# Patient Record
Sex: Male | Born: 1945 | Race: Black or African American | Hispanic: No | Marital: Married | Smoking: Never smoker
Health system: Southern US, Community
[De-identification: ages and names within clinical notes are randomized; demographics above are authoritative.]

## PROBLEM LIST (undated history)

## (undated) DIAGNOSIS — I10 Essential (primary) hypertension: Secondary | ICD-10-CM

## (undated) DIAGNOSIS — E785 Hyperlipidemia, unspecified: Secondary | ICD-10-CM

## (undated) DIAGNOSIS — H269 Unspecified cataract: Secondary | ICD-10-CM

## (undated) DIAGNOSIS — I219 Acute myocardial infarction, unspecified: Secondary | ICD-10-CM

## (undated) DIAGNOSIS — R011 Cardiac murmur, unspecified: Secondary | ICD-10-CM

## (undated) DIAGNOSIS — Z9289 Personal history of other medical treatment: Secondary | ICD-10-CM

## (undated) DIAGNOSIS — I2510 Atherosclerotic heart disease of native coronary artery without angina pectoris: Secondary | ICD-10-CM

## (undated) DIAGNOSIS — G459 Transient cerebral ischemic attack, unspecified: Secondary | ICD-10-CM

## (undated) DIAGNOSIS — N200 Calculus of kidney: Secondary | ICD-10-CM

## (undated) DIAGNOSIS — J90 Pleural effusion, not elsewhere classified: Secondary | ICD-10-CM

## (undated) DIAGNOSIS — T7840XA Allergy, unspecified, initial encounter: Secondary | ICD-10-CM

## (undated) DIAGNOSIS — R319 Hematuria, unspecified: Secondary | ICD-10-CM

## (undated) DIAGNOSIS — C8590 Non-Hodgkin lymphoma, unspecified, unspecified site: Secondary | ICD-10-CM

## (undated) DIAGNOSIS — M1990 Unspecified osteoarthritis, unspecified site: Secondary | ICD-10-CM

## (undated) DIAGNOSIS — J189 Pneumonia, unspecified organism: Secondary | ICD-10-CM

## (undated) HISTORY — DX: Unspecified cataract: H26.9

## (undated) HISTORY — DX: Unspecified osteoarthritis, unspecified site: M19.90

## (undated) HISTORY — PX: KNEE ARTHROSCOPY: SUR90

## (undated) HISTORY — DX: Allergy, unspecified, initial encounter: T78.40XA

## (undated) HISTORY — DX: Hyperlipidemia, unspecified: E78.5

## (undated) HISTORY — DX: Essential (primary) hypertension: I10

## (undated) HISTORY — PX: CORONARY ANGIOPLASTY WITH STENT PLACEMENT: SHX49

## (undated) HISTORY — DX: Personal history of other medical treatment: Z92.89

---
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
## 1997-12-03 ENCOUNTER — Emergency Department (HOSPITAL_COMMUNITY): Admission: EM | Admit: 1997-12-03 | Discharge: 1997-12-03 | Payer: Self-pay | Admitting: Emergency Medicine

## 1999-08-29 DIAGNOSIS — I219 Acute myocardial infarction, unspecified: Secondary | ICD-10-CM

## 1999-08-29 HISTORY — DX: Acute myocardial infarction, unspecified: I21.9

## 2000-08-28 HISTORY — PX: COLONOSCOPY: SHX174

## 2000-08-28 HISTORY — PX: ESOPHAGOGASTRODUODENOSCOPY (EGD) WITH ESOPHAGEAL DILATION: SHX5812

## 2000-10-19 ENCOUNTER — Ambulatory Visit (HOSPITAL_COMMUNITY): Admission: RE | Admit: 2000-10-19 | Discharge: 2000-10-19 | Payer: Self-pay | Admitting: Gastroenterology

## 2001-09-17 ENCOUNTER — Encounter (INDEPENDENT_AMBULATORY_CARE_PROVIDER_SITE_OTHER): Payer: Self-pay

## 2001-09-17 ENCOUNTER — Ambulatory Visit (HOSPITAL_COMMUNITY): Admission: RE | Admit: 2001-09-17 | Discharge: 2001-09-17 | Payer: Self-pay | Admitting: Surgery

## 2001-09-17 ENCOUNTER — Encounter: Payer: Self-pay | Admitting: Surgery

## 2001-10-09 ENCOUNTER — Encounter (INDEPENDENT_AMBULATORY_CARE_PROVIDER_SITE_OTHER): Payer: Self-pay | Admitting: Specialist

## 2001-10-09 ENCOUNTER — Ambulatory Visit (HOSPITAL_COMMUNITY): Admission: RE | Admit: 2001-10-09 | Discharge: 2001-10-09 | Payer: Self-pay | Admitting: Hematology & Oncology

## 2001-12-25 ENCOUNTER — Emergency Department (HOSPITAL_COMMUNITY): Admission: EM | Admit: 2001-12-25 | Discharge: 2001-12-25 | Payer: Self-pay | Admitting: Emergency Medicine

## 2001-12-31 ENCOUNTER — Inpatient Hospital Stay (HOSPITAL_COMMUNITY): Admission: EM | Admit: 2001-12-31 | Discharge: 2002-01-02 | Payer: Self-pay | Admitting: Neurosurgery

## 2001-12-31 ENCOUNTER — Encounter: Admission: RE | Admit: 2001-12-31 | Discharge: 2001-12-31 | Payer: Self-pay | Admitting: Internal Medicine

## 2001-12-31 ENCOUNTER — Encounter: Payer: Self-pay | Admitting: Internal Medicine

## 2002-01-01 ENCOUNTER — Encounter: Payer: Self-pay | Admitting: Neurosurgery

## 2002-02-05 ENCOUNTER — Encounter: Admission: RE | Admit: 2002-02-05 | Discharge: 2002-02-05 | Payer: Self-pay | Admitting: Neurosurgery

## 2002-02-05 ENCOUNTER — Encounter: Payer: Self-pay | Admitting: Neurosurgery

## 2003-07-31 ENCOUNTER — Encounter: Admission: RE | Admit: 2003-07-31 | Discharge: 2003-07-31 | Payer: Self-pay | Admitting: Family Medicine

## 2003-12-24 ENCOUNTER — Encounter: Admission: RE | Admit: 2003-12-24 | Discharge: 2003-12-24 | Payer: Self-pay | Admitting: Internal Medicine

## 2004-09-05 ENCOUNTER — Ambulatory Visit: Payer: Self-pay | Admitting: Internal Medicine

## 2004-09-09 ENCOUNTER — Encounter: Admission: RE | Admit: 2004-09-09 | Discharge: 2004-09-09 | Payer: Self-pay | Admitting: Internal Medicine

## 2005-04-06 ENCOUNTER — Ambulatory Visit: Payer: Self-pay | Admitting: Internal Medicine

## 2005-06-20 ENCOUNTER — Ambulatory Visit: Payer: Self-pay | Admitting: Family Medicine

## 2006-05-16 ENCOUNTER — Ambulatory Visit: Payer: Self-pay | Admitting: Internal Medicine

## 2006-06-06 ENCOUNTER — Ambulatory Visit: Payer: Self-pay | Admitting: Internal Medicine

## 2007-01-01 ENCOUNTER — Ambulatory Visit: Payer: Self-pay | Admitting: Internal Medicine

## 2007-01-03 DIAGNOSIS — Z8572 Personal history of non-Hodgkin lymphomas: Secondary | ICD-10-CM | POA: Insufficient documentation

## 2007-01-04 ENCOUNTER — Ambulatory Visit: Payer: Self-pay

## 2007-01-04 ENCOUNTER — Encounter: Payer: Self-pay | Admitting: Cardiology

## 2007-01-04 ENCOUNTER — Ambulatory Visit: Payer: Self-pay | Admitting: Internal Medicine

## 2007-01-07 ENCOUNTER — Encounter: Payer: Self-pay | Admitting: Internal Medicine

## 2007-03-15 ENCOUNTER — Encounter: Payer: Self-pay | Admitting: Internal Medicine

## 2007-06-07 ENCOUNTER — Encounter: Payer: Self-pay | Admitting: Internal Medicine

## 2007-06-24 ENCOUNTER — Telehealth (INDEPENDENT_AMBULATORY_CARE_PROVIDER_SITE_OTHER): Payer: Self-pay | Admitting: *Deleted

## 2007-06-26 ENCOUNTER — Ambulatory Visit: Payer: Self-pay | Admitting: Internal Medicine

## 2007-06-26 ENCOUNTER — Telehealth (INDEPENDENT_AMBULATORY_CARE_PROVIDER_SITE_OTHER): Payer: Self-pay | Admitting: *Deleted

## 2007-06-26 DIAGNOSIS — Z87442 Personal history of urinary calculi: Secondary | ICD-10-CM

## 2007-06-26 LAB — CONVERTED CEMR LAB
Glucose, Urine, Semiquant: NEGATIVE
Nitrite: NEGATIVE
Protein, U semiquant: NEGATIVE
WBC Urine, dipstick: NEGATIVE
pH: 6.5

## 2007-07-31 ENCOUNTER — Encounter: Payer: Self-pay | Admitting: Internal Medicine

## 2007-09-13 ENCOUNTER — Encounter: Payer: Self-pay | Admitting: Internal Medicine

## 2007-10-26 ENCOUNTER — Encounter: Payer: Self-pay | Admitting: Internal Medicine

## 2007-10-29 ENCOUNTER — Telehealth (INDEPENDENT_AMBULATORY_CARE_PROVIDER_SITE_OTHER): Payer: Self-pay | Admitting: *Deleted

## 2007-11-07 ENCOUNTER — Telehealth (INDEPENDENT_AMBULATORY_CARE_PROVIDER_SITE_OTHER): Payer: Self-pay | Admitting: *Deleted

## 2007-11-08 ENCOUNTER — Telehealth (INDEPENDENT_AMBULATORY_CARE_PROVIDER_SITE_OTHER): Payer: Self-pay | Admitting: *Deleted

## 2007-12-12 ENCOUNTER — Encounter: Payer: Self-pay | Admitting: Internal Medicine

## 2008-04-06 ENCOUNTER — Encounter: Payer: Self-pay | Admitting: Internal Medicine

## 2008-05-15 ENCOUNTER — Encounter: Admission: RE | Admit: 2008-05-15 | Discharge: 2008-05-15 | Payer: Self-pay | Admitting: Ophthalmology

## 2008-06-08 ENCOUNTER — Ambulatory Visit (HOSPITAL_COMMUNITY): Admission: RE | Admit: 2008-06-08 | Discharge: 2008-06-08 | Payer: Self-pay | Admitting: Ophthalmology

## 2008-06-22 ENCOUNTER — Encounter: Payer: Self-pay | Admitting: Internal Medicine

## 2008-11-26 ENCOUNTER — Ambulatory Visit: Payer: Self-pay | Admitting: Internal Medicine

## 2008-11-26 DIAGNOSIS — I10 Essential (primary) hypertension: Secondary | ICD-10-CM | POA: Insufficient documentation

## 2009-06-22 ENCOUNTER — Ambulatory Visit: Payer: Self-pay | Admitting: Internal Medicine

## 2009-06-22 DIAGNOSIS — R319 Hematuria, unspecified: Secondary | ICD-10-CM | POA: Insufficient documentation

## 2009-06-22 LAB — CONVERTED CEMR LAB
Ketones, urine, test strip: NEGATIVE
Nitrite: NEGATIVE
Protein, U semiquant: NEGATIVE
Specific Gravity, Urine: 1.015
Urobilinogen, UA: 0.2
WBC Urine, dipstick: NEGATIVE

## 2009-06-23 ENCOUNTER — Encounter: Payer: Self-pay | Admitting: Internal Medicine

## 2009-06-25 ENCOUNTER — Encounter (INDEPENDENT_AMBULATORY_CARE_PROVIDER_SITE_OTHER): Payer: Self-pay | Admitting: *Deleted

## 2009-12-15 ENCOUNTER — Ambulatory Visit: Payer: Self-pay | Admitting: Internal Medicine

## 2009-12-15 DIAGNOSIS — N390 Urinary tract infection, site not specified: Secondary | ICD-10-CM

## 2009-12-15 DIAGNOSIS — N401 Enlarged prostate with lower urinary tract symptoms: Secondary | ICD-10-CM

## 2009-12-15 DIAGNOSIS — N400 Enlarged prostate without lower urinary tract symptoms: Secondary | ICD-10-CM | POA: Insufficient documentation

## 2009-12-15 DIAGNOSIS — N138 Other obstructive and reflux uropathy: Secondary | ICD-10-CM

## 2009-12-15 LAB — CONVERTED CEMR LAB
Bilirubin Urine: NEGATIVE
Ketones, urine, test strip: NEGATIVE
Nitrite: NEGATIVE
Protein, U semiquant: NEGATIVE
Urobilinogen, UA: 0.2

## 2009-12-16 ENCOUNTER — Encounter: Payer: Self-pay | Admitting: Internal Medicine

## 2009-12-21 LAB — CONVERTED CEMR LAB
Basophils Relative: 1.5 % (ref 0.0–3.0)
Eosinophils Relative: 1.1 % (ref 0.0–5.0)
Hemoglobin: 12.7 g/dL — ABNORMAL LOW (ref 13.0–17.0)
MCHC: 33.7 g/dL (ref 30.0–36.0)
MCV: 89.4 fL (ref 78.0–100.0)
PSA: 8.78 ng/mL — ABNORMAL HIGH (ref 0.10–4.00)
RBC: 4.22 M/uL (ref 4.22–5.81)
WBC: 4.1 10*3/uL — ABNORMAL LOW (ref 4.5–10.5)

## 2009-12-24 ENCOUNTER — Ambulatory Visit: Payer: Self-pay | Admitting: Internal Medicine

## 2009-12-28 ENCOUNTER — Ambulatory Visit: Payer: Self-pay | Admitting: Internal Medicine

## 2009-12-28 ENCOUNTER — Telehealth (INDEPENDENT_AMBULATORY_CARE_PROVIDER_SITE_OTHER): Payer: Self-pay | Admitting: *Deleted

## 2009-12-28 DIAGNOSIS — D649 Anemia, unspecified: Secondary | ICD-10-CM

## 2009-12-28 DIAGNOSIS — R972 Elevated prostate specific antigen [PSA]: Secondary | ICD-10-CM | POA: Insufficient documentation

## 2009-12-28 DIAGNOSIS — D696 Thrombocytopenia, unspecified: Secondary | ICD-10-CM | POA: Insufficient documentation

## 2009-12-30 LAB — CONVERTED CEMR LAB
Basophils Absolute: 0 10*3/uL (ref 0.0–0.1)
Basophils Relative: 0.5 % (ref 0.0–3.0)
Eosinophils Absolute: 0.1 10*3/uL (ref 0.0–0.7)
Folate: 7 ng/mL
Hemoglobin: 13.4 g/dL (ref 13.0–17.0)
Iron: 82 ug/dL (ref 42–165)
Lymphocytes Relative: 18.6 % (ref 12.0–46.0)
MCHC: 34.5 g/dL (ref 30.0–36.0)
Monocytes Relative: 11.9 % (ref 3.0–12.0)
Neutro Abs: 1.9 10*3/uL (ref 1.4–7.7)
Neutrophils Relative %: 65.4 % (ref 43.0–77.0)
RBC: 4.39 M/uL (ref 4.22–5.81)
RDW: 13.1 % (ref 11.5–14.6)
Transferrin: 237.3 mg/dL (ref 212.0–360.0)

## 2010-01-26 ENCOUNTER — Encounter: Payer: Self-pay | Admitting: Internal Medicine

## 2010-02-14 ENCOUNTER — Ambulatory Visit: Payer: Self-pay | Admitting: Internal Medicine

## 2010-02-18 ENCOUNTER — Telehealth (INDEPENDENT_AMBULATORY_CARE_PROVIDER_SITE_OTHER): Payer: Self-pay | Admitting: *Deleted

## 2010-03-03 ENCOUNTER — Ambulatory Visit: Payer: Self-pay | Admitting: Internal Medicine

## 2010-03-14 LAB — CONVERTED CEMR LAB
Albumin: 4.3 g/dL (ref 3.5–5.2)
CO2: 30 meq/L (ref 19–32)
Calcium: 9.8 mg/dL (ref 8.4–10.5)
Creatinine, Ser: 1.1 mg/dL (ref 0.4–1.5)
GFR calc non Af Amer: 88.18 mL/min (ref >60)
Glucose, Bld: 90 mg/dL (ref 70–99)
PSA: 2.58 ng/mL (ref 0.10–4.00)
Sodium: 140 meq/L (ref 135–145)
Total Protein: 7.3 g/dL (ref 6.0–8.3)

## 2010-04-07 ENCOUNTER — Telehealth (INDEPENDENT_AMBULATORY_CARE_PROVIDER_SITE_OTHER): Payer: Self-pay | Admitting: *Deleted

## 2010-05-06 ENCOUNTER — Encounter: Payer: Self-pay | Admitting: Internal Medicine

## 2010-08-05 ENCOUNTER — Encounter: Payer: Self-pay | Admitting: Internal Medicine

## 2010-09-18 ENCOUNTER — Encounter: Payer: Self-pay | Admitting: Otolaryngology

## 2010-09-19 ENCOUNTER — Encounter: Payer: Self-pay | Admitting: Ophthalmology

## 2010-09-27 NOTE — Letter (Signed)
Summary: [facility]  [facility]   Imported By: Lanelle Bal 02/03/2010 13:29:19  _____________________________________________________________________  External Attachment:    Type:   Image     Comment:   External Document

## 2010-09-27 NOTE — Progress Notes (Signed)
Summary: Refill request  Phone Note Refill Request Call back at [phone] Message from:  Patient  Refills Requested: Medication #1:  LOTREL 5-20 MG CAPS Take one capsule daily. [facility]   Method Requested: Electronic Initial call taken by: Shonna Chock,  February 18, 2010 3:20 PM    Prescriptions: LOTREL 5-20 MG CAPS (AMLODIPINE BESY-BENAZEPRIL HCL) Take one capsule daily  #90 Capsule x 3   Entered by:   Shonna Chock   Authorized by:   Marga Melnick MD   Signed by:   Shonna Chock on 02/18/2010   Method used:   Electronically to        [facility] [address] [phone]* (retail)       [address] [place], [place]  [address]       Ph: [phone]       Fax: [phone]   RxID:   [id number]

## 2010-09-27 NOTE — Assessment & Plan Note (Signed)
Summary: rto uti/cbs pt will arrive at 8:15   Vital Signs:  Patient profile:   65 year old male Weight:      209 pounds Temp:     98.5 degrees F oral Pulse rate:   64 / minute Resp:     12 per minute BP sitting:   134 / 86  (left arm) Cuff size:   large  Vitals Entered By: Shonna Chock (Dec 28, 2009 8:15 AM) CC: Discuss UTI symptoms Comments REVIEWED MED LIST, PATIENT AGREED DOSE AND INSTRUCTION CORRECT    CC:  Discuss UTI symptoms.  History of Present Illness: Residual intermittent suprapubic area "soreness".GU ROS improved (see notes) F/U urine culture : no growth. CBC revealed mild anemia(12.7/37.7), low WBC (4100 with 21% monos) & TCP(90,000). PSA: 8.78 .DRE : revealed only borderline enlargement w/o nodules or other pathology.  Allergies (verified): No Known Drug Allergies  Review of Systems General:  Denies chills, fever, sweats, and weight loss. GI:  Denies abdominal pain, bloody stools, dark tarry stools, and indigestion. GU:  Complains of urinary frequency; denies discharge, dysuria, and hematuria; Nocturia down from 4X to 2X/ night , usually early in am (2 am & 5:45 am).  Physical Exam  General:  Thin but well-nourished,in no acute distress; alert,appropriate and cooperative throughout examination Abdomen:  Bowel sounds positive,abdomen soft but  slightly tender suprapubic area  without masses, organomegaly or hernias noted. Skin:  Intact without suspicious lesions or rashes Cervical Nodes:  No lymphadenopathy noted Axillary Nodes:  No palpable lymphadenopathy Inguinal Nodes:  No significant adenopathy   Impression & Recommendations:  Problem # 1:  PSA, INCREASED (ICD-790.93)  probable prostatitis in context of E coli UTI  Orders: Venipuncture ([address]) TLB-PSA (Prostate Specific Antigen) (84153-PSA)  Problem # 2:  ANEMIA, MILD (ICD-285.9)  Orders: Venipuncture (60454) TLB-CBC Platelet - w/Differential (85025-CBCD) TLB-B12 + Folate Pnl  (09811_91478-G95/AOZ) TLB-IBC Pnl (Iron/FE;Transferrin) (83550-IBC)  Problem # 3:  THROMBOCYTOPENIA (ICD-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 1 by mouth qd 2)  Lotrel 5-20 Mg Caps (Amlodipine besy-benazepril hcl) .... Take one capsule daily 3)  Ciprofloxacin Hcl 500 Mg Tabs (Ciprofloxacin hcl) .Marland Kitchen.. 1 two times a day  Patient Instructions: 1)  Drink to thirst , up to 40 oz/ day.  Appended Document: rto uti/cbs pt will arrive at 8:15 Large ganglion dorsum of L hand.Gentle massage recommended ; Hand Surgeon if progressive.

## 2010-09-27 NOTE — Assessment & Plan Note (Signed)
Summary: rto /cbs   Vital Signs:  Patient profile:   65 year old male Weight:      207.6 pounds Pulse rate:   72 / minute Resp:     14 per minute BP sitting:   130 / 80  (left arm) Cuff size:   large  Vitals Entered By: Shonna Chock (February 14, 2010 3:38 PM) CC: follow-up visit Comments REVIEWED MED LIST, PATIENT AGREED DOSE AND INSTRUCTION CORRECT    CC:  follow-up visit.  History of Present Illness: Dr Mercy Riding  01/26/2010 note reviewed ; infusion(chemo)  every 90 days. He mentions possible CT scan in 03/2010. No recurrent UTI symptoms. He is concerned about the Lymphoma impacting hepatic & renal function.  Allergies (verified): No Known Drug Allergies  Review of Systems General:  Denies chills, fatigue, fever, sweats, and weight loss. GU:  Denies discharge, dysuria, and hematuria; Nocturia 1-2X/ night.  Physical Exam  General:  well-nourished,in no acute distress; alert,appropriate and cooperative throughout examination Eyes:  Small sty OD lower lid Lungs:  Normal respiratory effort, chest expands symmetrically. Lungs are clear to auscultation, no crackles or wheezes. Heart:  normal rate, regular rhythm, no gallop, no rub, no JVD, no HJR, and grade 1 /6 systolic murmur.   Abdomen:  Bowel sounds positive,abdomen soft and non-tender without masses, organomegaly or hernias noted. 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 1 by mouth qd    Lotrel 5-20 Mg Caps (Amlodipine besy-benazepril hcl) .Marland Kitchen... Take one capsule daily  Complete Medication List: 1)   Doxazosin Mesylate 4 Mg Tabs (Doxazosin mesylate) .Marland Kitchen.. 1 by mouth qd 2)  Lotrel 5-20 Mg Caps (Amlodipine besy-benazepril hcl) .... Take one capsule daily  Patient Instructions: 1)  Schedule fasting labs: 2)  BMP prior to visit, ICD-9:401.9 3)  Hepatic Panel prior to visit, ICD-9:995.20 4)  PSA prior to visit, ICD-9:790.93

## 2010-09-27 NOTE — Letter (Signed)
Summary: [facility]  [facility]   Imported By: Lanelle Bal 05/17/2010 10:06:36  _____________________________________________________________________  External Attachment:    Type:   Image     Comment:   External Document

## 2010-09-27 NOTE — Assessment & Plan Note (Signed)
Summary: discuss, bladder issue//fd   Vital Signs:  Patient profile:   65 year old male Weight:      205 pounds Temp:     99.5 degrees F oral Pulse rate:   80 / minute Resp:     15 per minute BP sitting:   118 / 68  (left arm)  Vitals Entered By: Jeremy Johann CMA (December 15, 2009 12:52 PM) CC: uncontrolled urination x2days Comments REVIEWED MED LIST, PATIENT AGREED DOSE AND INSTRUCTION CORRECT    CC:  uncontrolled urination x2days.  History of Present Illness: Onset as nocturia 3-4X for 6 months; yesterday daytime frequency &severe  urgency & frank  incontinence. Dysuria last night & visualized blood noted in urine this am.Since this am volume is more normal. No Rx to date . PMH of  nephrolithiasis X 2. His sister had similar symptoms several years ago. Lymphoma in remission; F/U 06/08 @ [facility].  Allergies (verified): No Known Drug Allergies  Review of Systems General:  Complains of chills; denies fever and sweats. GI:  Complains of loss of appetite; denies diarrhea. GU:  Denies discharge. MS:  Complains of low back pain; denies muscle weakness; Minor LBP. Derm:  Denies lesion(s) and rash.  Physical Exam  General:  Appears tired,in no acute distress; alert,appropriate and cooperative throughout examination Lungs:  Normal respiratory effort, chest expands symmetrically. Lungs are clear to auscultation, no crackles or wheezes. Heart:  Normal rate and regular rhythm. S1 and S2 normal without gallop, murmur, click, rub.S4 with slurring Abdomen:  Bowel sounds positive,abdomen soft but slightly tender BLQ  without masses, organomegaly or hernias noted. Rectal:  No external abnormalities noted. Normal sphincter tone. No rectal masses or tenderness. Genitalia:  Testes bilaterally descended without nodularity, tenderness or masses. No scrotal masses or lesions. No penis lesions or urethral discharge. Prostate:  Prostate gland firm and smooth, UL enlargement, nodularity, mass,  asymmetry or induration.Slight tenderness Msk:  Minimal flank tenderness to percussion Extremities:  Neg SLR Skin:  Intact without suspicious lesions or rashes Cervical Nodes:  No lymphadenopathy noted Axillary Nodes:  No palpable lymphadenopathy Inguinal Nodes:  No significant adenopathy Psych:  memory intact for recent and remote and subdued.     Impression & Recommendations:  Problem # 1:  UTI (ICD-599.0)  The following medications were removed from the medication list:    Ciprofloxacin Hcl 500 Mg Tabs (Ciprofloxacin hcl) .Marland Kitchen... 1 two times a day His updated medication list for this problem includes:    Ciprofloxacin Hcl 500 Mg Tabs (Ciprofloxacin hcl) .Marland Kitchen... 1 two times a day  Orders: T-Culture, Urine (56213-08657) UA Dipstick w/o Micro (automated)  (81003) Venipuncture (84696) TLB-CBC Platelet - w/Differential (85025-CBCD) TLB-PSA (Prostate Specific Antigen) (84153-PSA)  Problem # 2:  HEMATURIA (ICD-599.70)  The following medications were removed from the medication list:    Ciprofloxacin Hcl 500 Mg Tabs (Ciprofloxacin hcl) .Marland Kitchen... 1 two times a day His updated medication list for this problem includes:    Ciprofloxacin Hcl 500 Mg Tabs (Ciprofloxacin hcl) .Marland Kitchen... 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 1 by mouth qd  Orders: UA Dipstick w/o Micro (automated)  (81003) Venipuncture (72536) TLB-PSA (Prostate Specific Antigen) (84153-PSA)  Complete Medication  List: 1)  Doxazosin Mesylate 4 Mg Tabs (Doxazosin mesylate) .Marland Kitchen.. 1 by mouth qd 2)  Lotrel 5-20 Mg Caps (Amlodipine besy-benazepril hcl) .... Take one capsule  daily 3)  Ciprofloxacin Hcl 500 Mg Tabs (Ciprofloxacin hcl) .Marland Kitchen.. 1 two times a day  Patient Instructions: 1)  Hold Doxazosin if frequency is severe. 2)  Drink as much fluid as you can tolerate for the next few days. 3)  Take 650-1000mg  of Tylenol every 4-6 hours as needed for relief of pain or comfort of fever AVOID taking more than 4000mg   in a 24 hour period (can cause liver damage in higher doses) OR take 400-600mg  of Ibuprofen (Advil, Motrin) with food every 4-6 hours as needed for relief of pain or comfort of fever. Prescriptions: CIPROFLOXACIN HCL 500 MG TABS (CIPROFLOXACIN HCL) 1 two times a day  #20 x 0   Entered and Authorized by:   Marga Melnick MD   Signed by:   Marga Melnick MD on 12/15/2009   Method used:   [facility]ed to ...       [facility] [address] [phone]* (retail)       [address]       [place], [place]  [id number]       Ph: [phone] or [phone]       Fax: [phone]   RxID:   [id number]   Laboratory Results   Urine Tests   Date/Time Reported: December 15, 2009 12:53 PM  Routine Urinalysis   Color: yellow Appearance: Clear Glucose: negative   (Normal Range: Negative) Bilirubin: negative   (Normal Range: Negative) Ketone: negative   (Normal Range: Negative) Spec. Gravity: 1.010   (Normal Range: 1.003-1.035) Blood: large   (Normal Range: Negative) pH: 6.0   (Normal Range: 5.0-8.0) Protein: negative   (Normal Range: Negative) Urobilinogen: 0.2   (Normal Range: 0-1) Nitrite: negative   (Normal Range: Negative) Leukocyte Esterace: large   (Normal Range: Negative)

## 2010-09-27 NOTE — Progress Notes (Signed)
Summary: REFILL  Phone Note Outgoing Call Call back at [phone]   Call placed by: [facility] CMA,  April 07, 2010 12:04 PM Summary of Call: PT  LEFT VM ON TRAIGE VM THAT HE NEEDS REFILL OF MED SENT INTO [facility] OR  [address]. NO FURTHER INFO GIVEN..............Marland KitchenFelecia Deloach CMA  April 07, 2010 12:06 PM   Follow-up for Phone Call        Called patient x 2, no VM, will try agian later Follow-up by: Shonna Chock CMA,  April 07, 2010 2:51 PM  Additional Follow-up for Phone Call Additional follow up Details #1::        Patient called back, would like both meds sent in. Done Additional Follow-up by: Shonna Chock CMA,  April 08, 2010 4:48 PM    Prescriptions: LOTREL 5-20 MG CAPS (AMLODIPINE BESY-BENAZEPRIL HCL) Take one capsule daily  #90 Capsule x 2   Entered by:   Shonna Chock CMA   Authorized by:   Marga Melnick MD   Signed by:   Shonna Chock CMA on 04/08/2010   Method used:   Electronically to        [facility] [address] [phone]* (retail)       [address] [place], [place]  [address]       Ph: [phone]       Fax: [phone]   RxID:   [id number] DOXAZOSIN MESYLATE 4 MG TABS (DOXAZOSIN MESYLATE) 1 by mouth qd  #90 Tablet x 2   Entered by:   Shonna Chock CMA   Authorized by:   Marga Melnick MD   Signed by:   Shonna Chock CMA on 04/08/2010   Method used:   Electronically to        [facility] [address] [phone]* (retail)       [address] [place], [place]  [address]       Ph: [phone]       Fax: [phone]   RxID:   [id number]

## 2010-09-27 NOTE — Progress Notes (Signed)
Summary:  lab results  Phone Note Outgoing Call   Call placed by: [facility] CMA,  Dec 28, 2009 4:55 PM Summary of Call: left message to call office re-lab results  per dr hopper psa better but still elevated cipro 500 mg 1 tab two times a day #30 and recheck psa after thar...Marland KitchenMarland KitchenFelecia Deloach CMA  Dec 28, 2009 4:56 PM   Follow-up for Phone Call        Spoke with pt, pt is aware. Meds sent in. [facility] CMA  Dec 28, 2009 5:05 PM     Prescriptions: CIPROFLOXACIN HCL 500 MG TABS (CIPROFLOXACIN HCL) 1 two times a day  #30 x 0   Entered by:   [facility] Fossa CMA   Authorized by:   Marga Melnick MD   Signed by:   [facility] CMA on 12/28/2009   Method used:   [facility] [phone]* (retail)       [address]       [place], [place]  [address]       Ph: [phone] or [phone]       Fax: [phone]   RxID:   [id number]

## 2010-09-29 NOTE — Letter (Signed)
Summary: [facility]emat[facility] On[facility]  [facility] Hemat[facility] Oncology   Imported By: Lanelle Bal 08/18/2010 12:30:41  _____________________________________________________________________  External Attachment:    Type:   Image     Comment:   External Document

## 2010-11-10 ENCOUNTER — Encounter: Payer: Self-pay | Admitting: Internal Medicine

## 2010-11-10 ENCOUNTER — Encounter (INDEPENDENT_AMBULATORY_CARE_PROVIDER_SITE_OTHER): Payer: Medicare PPO | Admitting: Internal Medicine

## 2010-11-10 DIAGNOSIS — Z0000 Encounter for general adult medical examination without abnormal findings: Secondary | ICD-10-CM

## 2010-11-10 DIAGNOSIS — R9431 Abnormal electrocardiogram [ECG] [EKG]: Secondary | ICD-10-CM

## 2010-11-10 DIAGNOSIS — C8589 Other specified types of non-Hodgkin lymphoma, extranodal and solid organ sites: Secondary | ICD-10-CM

## 2010-11-10 DIAGNOSIS — N138 Other obstructive and reflux uropathy: Secondary | ICD-10-CM

## 2010-11-10 DIAGNOSIS — I10 Essential (primary) hypertension: Secondary | ICD-10-CM

## 2010-11-10 LAB — CONVERTED CEMR LAB
Cholesterol, target level: 200 mg/dL
HDL goal, serum: 40 mg/dL
LDL Goal: 130 mg/dL

## 2010-11-12 DIAGNOSIS — J309 Allergic rhinitis, unspecified: Secondary | ICD-10-CM | POA: Insufficient documentation

## 2010-11-12 DIAGNOSIS — E782 Mixed hyperlipidemia: Secondary | ICD-10-CM | POA: Insufficient documentation

## 2010-11-12 DIAGNOSIS — E785 Hyperlipidemia, unspecified: Secondary | ICD-10-CM | POA: Insufficient documentation

## 2010-11-15 NOTE — Assessment & Plan Note (Addendum)
Summary: cpe/cbs   Vital Signs:  Patient profile:   65 year old male Height:      73 inches Weight:      201 pounds BMI:     26.61 Temp:     98.4 degrees F oral Pulse rate:   73 / minute Resp:     14 per minute BP sitting:   126 / 88  (left arm) Cuff size:   large  Vitals Entered By: Shonna Chock CMA (November 10, 2010 9:41 AM) CC: 1.) CPX: not fasting   2.) Discuss meds that were rx'ed by another doctor, patient not currently taking meds (Hydrocodone & Clindamycin), Lipid Management  Vision Screening:Left eye with correction: 20 / 30 Right eye with correction: 20 / 25 Both eyes with correction: 20 / 25        Vision Entered By: Shonna Chock CMA (November 10, 2010 9:43 AM)   CC:  1.) CPX: not fasting   2.) Discuss meds that were rx'ed by another doctor, patient not currently taking meds (Hydrocodone & Clindamycin), and Lipid Management.  History of Present Illness: Here for Medicare AWV: 1.Risk factors based on Past M, S, F history:see Diagnoses; chart updated 2.Physical Activities:weights, walking, treadmill , etc 2-3X/ week 30-60 min  3.Depression/mood: no issues 4.Hearing: normal to whisper @ 6 ft 5.ADL's: no limitations 6.Fall Risk: none ( see #2) 7.Home Safety: no issues 8.Height, weight, &visual acuity:see VS 9.Counseling: POA & Living Will in place 10.Labs ordered based on risk factors: Orders will depend on results from [facility] pre chemo 11. Referral Coordination: ongoing monitor in place 12. Care Plan:immunizations up to date 13. Cognitive Assessment:Oriented x3; memory & recall  normal  ; math good; mood & affect normal .    Hypertension Follow-Up: He  reports urinary frequency, but denies headaches, edema,  fatigue, chest pain, chest pressure, exercise intolerance, dyspnea, palpitations, and syncope.  Compliance with medications (by patient report) has been near 100%.  The patient reports that dietary compliance has been fair.  The patient reports exercising 2-3 X  per week X 45 min w/o symptoms.  Adjunctive measures currently used by the patient include salt restriction.  BP @ home 130-140/75-85.  Lipid Management History:      Positive NCEP/ATP III risk factors include male age 44 years old or older and hypertension.  Negative NCEP/ATP III risk factors include non-diabetic, no family history for ischemic heart disease, non-tobacco-user status, no ASHD (atherosclerotic heart disease), no prior stroke/TIA, no peripheral vascular disease, and no history of aortic aneurysm.     Preventive Screening-Counseling & Management  Alcohol-Tobacco     Alcohol drinks/day: 0     Smoking Status: never  Caffeine-Diet-Exercise     Caffeine use/day: 1 cup/ week     Does Patient Exercise: yes  Hep-HIV-STD-Contraception     Dental Visit-last 6 months yes     Sun Exposure-Excessive: no  Safety-Violence-Falls     Seat Belt Use: yes     Smoke Detectors: yes      Blood Transfusions:  no.        Travel History:  [place] in 92s.    Allergies (verified): 1)  ! Clindamycin Hcl (Clindamycin Hcl)  Past History:  Past Medical History: Nephrolithiasis, PMH  of ? 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 LDL goal = <100, ideally < 70.  [place] Study LDL goal = < 130.  Past Surgical History: Colonoscopy X2 negative; EGD 2002: esophageal stricture Inguinal LN biopsy 2003: Follicular Lymphoma ; Arthroscopy L knee 1996  Family History: Father: CVA @ 65 Mother: lung cancer,DM,HTN Siblings: sister: ? Lymphoma ; niece: Gyn cancer  Social History: Married Never Smoked Alcohol use-no Retired Regular exercise-yes Caffeine use/day:  1 cup/ week Dental Care w/in 6 mos.:  yes Sun Exposure-Excessive:  no Risk analyst Use:  yes Blood Transfusions:  no Does Patient Exercise:  yes  Review of  Systems  The patient denies anorexia, fever, weight loss, weight gain, hoarseness, prolonged cough, hemoptysis, abdominal pain, melena, hematochezia, severe indigestion/heartburn, hematuria, suspicious skin lesions, unusual weight change, abnormal bleeding, enlarged lymph nodes, and angioedema.         N&V with Clindamycin & hydrocodone  ( Rxed by Dr Laural Benes , DDS for dental infection; permanent crown pending). No reported  diarrhea . Hiccoughs since chemo 03/09.  Physical Exam  General:  Thin but well-nourished,in no acute distress; alert,appropriate and cooperative throughout examination. Mild hiccoughing  Head:  Normocephalic and atraumatic without obvious abnormalities.Pattern  alopecia. Eyes:  No corneal or conjunctival inflammation noted.  Perrla. Funduscopic exam benign, without hemorrhages, exudates or papilledema.  Ears:  External ear exam shows no significant lesions or deformities.  Otoscopic examination reveals clear canals, tympanic membranes are intact bilaterally without bulging, retraction, inflammation or discharge. Hearing is grossly normal bilaterally. Nose:  External nasal examination shows no deformity or inflammation. Nasal mucosa are pink and moist without lesions or exudates. Mouth:  Oral mucosa and oropharynx without lesions or exudates.  Teeth in good repair. Neck:  No deformities, masses, or tenderness noted. Chest Wall:  R clavicular head > L Lungs:  Normal respiratory effort, chest expands symmetrically. Lungs are clear to auscultation, no crackles or wheezes. Heart:  regular rhythm, no murmur, no gallop, no rub, no JVD, no HJR, and bradycardia.   Abdomen:  Bowel sounds positive,abdomen soft and non-tender without masses, organomegaly or hernias noted. Rectal:  No external abnormalities noted. Normal sphincter tone. No rectal masses or tenderness. Genitalia:  Testes bilaterally descended without nodularity, tenderness or masses. No scrotal masses or lesions. No penis  lesions or urethral discharge. Prostate:  no nodules, no asymmetry, no induration; 1+ enlarged.   Msk:  No deformity or scoliosis noted of thoracic or lumbar spine.   Pulses:  R and L carotid,radial,dorsalis pedis and posterior tibial pulses are full and equal bilaterally Extremities:  No clubbing, cyanosis, edema, or deformity noted with normal full range of motion of all joints.   Neurologic:  alert & oriented X3 and DTRs symmetrical and normal.   Skin:  Intact without suspicious lesions or rashes Cervical Nodes:  No lymphadenopathy noted Axillary Nodes:  No palpable lymphadenopathy Inguinal Nodes:  No significant adenopathy Psych:  memory intact for recent and remote, normally interactive, and good eye contact.     Impression & Recommendations:  Problem # 1:  PREVENTIVE HEALTH CARE (ICD-V70.0)  Orders: Medicare -1st Annual Wellness Visit [phone])  Problem # 2:  HYPERTENSION (ICD-401.9)  His updated medication list for this problem includes:    Doxazosin Mesylate 4 Mg Tabs (Doxazosin mesylate) .Marland Kitchen... 1 by mouth qd    Lotrel 5-20 Mg Caps (Amlodipine besy-benazepril hcl) .Marland Kitchen... 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 1 by mouth qd 2)  Lotrel 5-20 Mg Caps (Amlodipine besy-benazepril hcl) .... Take one capsule daily 3)  Chemo  .... As directed by cnacer dr.  Lipid Assessment/Plan:      Based on NCEP/ATP III, the patient's risk factor category is "2 or more risk factors and a calculated 10 year CAD risk of > 20%".  The patient's lipid goals are as  follows: Total cholesterol goal is 200; LDL cholesterol goal is 130; HDL cholesterol goal is 40; Triglyceride goal is 150.    Patient Instructions: 1)  I will order blood tests after reviewing data from [facility]; tentatively fasting labs are listed below with Codes. 2)  Check your Blood Pressure regularly. If it is above: 135/85 ON AVERAGE  you should make an appointment. 3)  Schedule a colonoscopy as per Dr Marzetta Board recommendations 4)  BMP,ICD-9:401.9 5)  Hepatic Panel , ICD-9:995.20,202.80 6)  Lipid Panel , ICD-9:272.4 7)  TSH , ICD-9:272.4 8)  PSA , ICD-9:600.9   Orders Added: 1)  Medicare -1st Annual Wellness Visit [G0438] 2)  Est. Patient Level III [54098] 3)  EKG w/ Interpretation [93000]   Immunization History:  Tetanus/Td Immunization History:    Tetanus/Td:  historical (05/16/2006)  Pneumovax Immunization History:    Pneumovax:  historical (08/28/2010)   Immunization History:  Tetanus/Td Immunization History:    Tetanus/Td:  Historical (05/16/2006)  Pneumovax Immunization History:    Pneumovax:  Historical (08/28/2010)    Appended Document: cpe/cbs copy of ov mailed per MD request

## 2010-11-18 ENCOUNTER — Encounter: Payer: Self-pay | Admitting: Internal Medicine

## 2010-11-21 ENCOUNTER — Encounter: Payer: Self-pay | Admitting: Internal Medicine

## 2010-11-21 ENCOUNTER — Ambulatory Visit (INDEPENDENT_AMBULATORY_CARE_PROVIDER_SITE_OTHER): Payer: Medicare PPO | Admitting: Internal Medicine

## 2010-11-21 VITALS — BP 122/80 | HR 88 | Temp 98.5°F | Wt 193.6 lb

## 2010-11-21 DIAGNOSIS — B0229 Other postherpetic nervous system involvement: Secondary | ICD-10-CM

## 2010-11-21 MED ORDER — GABAPENTIN 100 MG PO CAPS: 100.0000 mg | ORAL_CAPSULE | Freq: Every day | ORAL | Status: DC

## 2010-11-21 NOTE — Progress Notes (Signed)
  Subjective:    Patient ID: Austin [name], male    DOB: 09-28-1945, 65 y.o.   MRN: [id number]  HPI     RASH  Location: L ear Onset:1 week ago  Course:resolution with Mupirocin Red by Dr Ezzard Standing, ENT; he diagnosed Zoster           History:Pruritis:intermittent Tenderness:slightly with burning L jaw area intermittently New medications/antibiotics:see above Tick/insect/pet exposure:no Recent travel:no .New detergent or  other topical exposure:no  Red Flags Feeling ill:no Fever:no Mouth lesions:no Facial/tongue swelling/difficulty breathing: no Immunocompromised: receiving chemotherapy @ [facility]      Review of Systems     Objective:   Physical Exam  Constitutional: He appears well-developed and well-nourished.  HENT:  Right Ear: External ear normal.  Left Ear: External ear normal.  Nose: Nose normal.  Mouth/Throat: Oropharynx is clear and moist. No oropharyngeal exudate.        The examination the left ear is normal. There is no evidence of rash in this area.  Eyes:        No conjunctivitis is present; there is slight ptosis of the right eye. Arcus senilis is noted.  Lymphadenopathy:    He has no cervical adenopathy.  Skin: Skin is warm and dry. No rash noted. He is not diaphoretic. No erythema.          Assessment & Plan:   #1 the history suggests post herpetic neuralgia in the area along the left jaw  Inferiorly    Plan : #1 gabapentin 100 mg every 8 hours will be prescribed as needed.

## 2010-11-21 NOTE — Patient Instructions (Signed)
Please take the gabapentin 100 mg every 8 hours as needed for discomfort in the ear &  jaw area. Please go to [facility] M.[facility]. To review post herpetic neuralgia

## 2010-12-02 ENCOUNTER — Inpatient Hospital Stay (HOSPITAL_COMMUNITY)
Admission: EM | Admit: 2010-12-02 | Discharge: 2010-12-05 | DRG: 248 | Disposition: A | Discharge status: Home / Self Care | Payer: Medicare PPO | Source: Ambulatory Visit | Attending: Cardiology | Admitting: Cardiology

## 2010-12-02 DIAGNOSIS — C8589 Other specified types of non-Hodgkin lymphoma, extranodal and solid organ sites: Secondary | ICD-10-CM | POA: Diagnosis present

## 2010-12-02 DIAGNOSIS — Z7902 Long term (current) use of antithrombotics/antiplatelets: Secondary | ICD-10-CM

## 2010-12-02 DIAGNOSIS — Z7982 Long term (current) use of aspirin: Secondary | ICD-10-CM

## 2010-12-02 DIAGNOSIS — I10 Essential (primary) hypertension: Secondary | ICD-10-CM | POA: Diagnosis present

## 2010-12-02 DIAGNOSIS — D61810 Antineoplastic chemotherapy induced pancytopenia: Secondary | ICD-10-CM | POA: Diagnosis present

## 2010-12-02 DIAGNOSIS — I2129 ST elevation (STEMI) myocardial infarction involving other sites: Principal | ICD-10-CM | POA: Diagnosis present

## 2010-12-02 DIAGNOSIS — Z8673 Personal history of transient ischemic attack (TIA), and cerebral infarction without residual deficits: Secondary | ICD-10-CM

## 2010-12-02 DIAGNOSIS — I2510 Atherosclerotic heart disease of native coronary artery without angina pectoris: Secondary | ICD-10-CM

## 2010-12-02 DIAGNOSIS — R072 Precordial pain: Secondary | ICD-10-CM

## 2010-12-02 DIAGNOSIS — I959 Hypotension, unspecified: Secondary | ICD-10-CM | POA: Diagnosis present

## 2010-12-02 DIAGNOSIS — I498 Other specified cardiac arrhythmias: Secondary | ICD-10-CM | POA: Diagnosis present

## 2010-12-02 DIAGNOSIS — T451X5A Adverse effect of antineoplastic and immunosuppressive drugs, initial encounter: Secondary | ICD-10-CM | POA: Diagnosis present

## 2010-12-02 DIAGNOSIS — E785 Hyperlipidemia, unspecified: Secondary | ICD-10-CM | POA: Diagnosis present

## 2010-12-02 LAB — LIPID PANEL
Cholesterol: 234 mg/dL — ABNORMAL HIGH (ref 0–200)
HDL: 40 mg/dL (ref >39)
LDL Cholesterol: 178 mg/dL — ABNORMAL HIGH (ref 0–99)
Total CHOL/HDL Ratio: 5.9 RATIO
Triglycerides: 79 mg/dL (ref <150)

## 2010-12-02 LAB — CARDIAC PANEL(CRET KIN+CKTOT+MB+TROPI)
Relative Index: 9.1 — ABNORMAL HIGH (ref 0.0–2.5)
Total CK: 644 U/L — ABNORMAL HIGH (ref 7–232)
Troponin I: 8.02 ng/mL (ref 0.00–0.06)

## 2010-12-02 LAB — COMPREHENSIVE METABOLIC PANEL WITH GFR
ALT: 24 U/L (ref 0–53)
AST: 85 U/L — ABNORMAL HIGH (ref 0–37)
Albumin: 3.7 g/dL (ref 3.5–5.2)
Alkaline Phosphatase: 83 U/L (ref 39–117)
BUN: 8 mg/dL (ref 6–23)
CO2: 24 meq/L (ref 19–32)
Calcium: 9.7 mg/dL (ref 8.4–10.5)
Chloride: 102 meq/L (ref 96–112)
Creatinine, Ser: 0.9 mg/dL (ref 0.4–1.5)
GFR calc non Af Amer: >60 mL/min
Glucose, Bld: 123 mg/dL — ABNORMAL HIGH (ref 70–99)
Potassium: 3.9 meq/L (ref 3.5–5.1)
Sodium: 134 meq/L — ABNORMAL LOW (ref 135–145)
Total Bilirubin: 1.2 mg/dL (ref 0.3–1.2)
Total Protein: 6.9 g/dL (ref 6.0–8.3)

## 2010-12-02 LAB — PROTIME-INR
INR: 1.2 (ref 0.00–1.49)
Prothrombin Time: 15.4 s — ABNORMAL HIGH (ref 11.6–15.2)

## 2010-12-02 LAB — POCT I-STAT, CHEM 8
Calcium, Ion: 1.31 mmol/L (ref 1.12–1.32)
Creatinine, Ser: 1 mg/dL (ref 0.4–1.5)
Glucose, Bld: 124 mg/dL — ABNORMAL HIGH (ref 70–99)
HCT: 37 % — ABNORMAL LOW (ref 39.0–52.0)
Hemoglobin: 12.6 g/dL — ABNORMAL LOW (ref 13.0–17.0)
Potassium: 4.1 mEq/L (ref 3.5–5.1)
TCO2: 26 mmol/L (ref 0–100)

## 2010-12-02 LAB — CBC
MCH: 30 pg (ref 26.0–34.0)
MCHC: 36.5 g/dL — ABNORMAL HIGH (ref 30.0–36.0)
Platelets: 119 10*3/uL — ABNORMAL LOW (ref 150–400)
RDW: 12 % (ref 11.5–15.5)

## 2010-12-02 LAB — APTT

## 2010-12-02 LAB — HEMOGLOBIN A1C: Hgb A1c MFr Bld: 5.9 % — ABNORMAL HIGH (ref <5.7)

## 2010-12-03 LAB — CK TOTAL AND CKMB (NOT AT ARMC)
CK, MB: 38.7 ng/mL (ref 0.3–4.0)
CK, MB: 21.1 ng/mL (ref 0.3–4.0)
CK, MB: 15 ng/mL (ref 0.3–4.0)
Relative Index: 6.6 — ABNORMAL HIGH (ref 0.0–2.5)
Relative Index: 5.5 — ABNORMAL HIGH (ref 0.0–2.5)

## 2010-12-03 LAB — CBC
Hemoglobin: 11.5 g/dL — ABNORMAL LOW (ref 13.0–17.0)
MCH: 29.3 pg (ref 26.0–34.0)
Platelets: 105 10*3/uL — ABNORMAL LOW (ref 150–400)
RBC: 3.93 MIL/uL — ABNORMAL LOW (ref 4.22–5.81)
WBC: 3.9 10*3/uL — ABNORMAL LOW (ref 4.0–10.5)

## 2010-12-03 LAB — PLATELET INHIBITION P2Y12
P2Y12 % Inhibition: 16 %
Platelet Function  P2Y12: 243 [PRU] (ref 194–418)

## 2010-12-03 LAB — BASIC METABOLIC PANEL
CO2: 26 mEq/L (ref 19–32)
Calcium: 9.3 mg/dL (ref 8.4–10.5)
Creatinine, Ser: 1.12 mg/dL (ref 0.4–1.5)
GFR calc Af Amer: >60 mL/min (ref >60)
GFR calc non Af Amer: >60 mL/min (ref >60)
Glucose, Bld: 102 mg/dL — ABNORMAL HIGH (ref 70–99)
Sodium: 135 mEq/L (ref 135–145)

## 2010-12-03 LAB — GLUCOSE, CAPILLARY

## 2010-12-03 LAB — TROPONIN I
Troponin I: 15.87 ng/mL (ref 0.00–0.06)
Troponin I: 8.55 ng/mL (ref 0.00–0.06)

## 2010-12-04 ENCOUNTER — Inpatient Hospital Stay (HOSPITAL_COMMUNITY): Payer: Medicare PPO

## 2010-12-04 LAB — BASIC METABOLIC PANEL
BUN: 16 mg/dL (ref 6–23)
Calcium: 9.1 mg/dL (ref 8.4–10.5)
GFR calc non Af Amer: >60 mL/min (ref >60)
Glucose, Bld: 100 mg/dL — ABNORMAL HIGH (ref 70–99)

## 2010-12-05 DIAGNOSIS — I2129 ST elevation (STEMI) myocardial infarction involving other sites: Secondary | ICD-10-CM

## 2010-12-05 LAB — CBC
MCHC: 35.6 g/dL (ref 30.0–36.0)
Platelets: 114 10*3/uL — ABNORMAL LOW (ref 150–400)
RDW: 12 % (ref 11.5–15.5)
WBC: 3.2 10*3/uL — ABNORMAL LOW (ref 4.0–10.5)

## 2010-12-06 NOTE — Discharge Summary (Addendum)
NAMEMarland Kitchen  Austin, [name] NO.:  [id number]  MEDICAL RECORD NO.:  [id number]           [name] TYPE:  I  LOCATION:  2022                         FACILITY:  [facility]  PHYSICIAN:  Arturo Morton. Riley Kill, MD, FACCDATE OF BIRTH:  1946-02-10  DATE OF ADMISSION:  12/02/2010 DATE OF DISCHARGE:  12/05/2010                              DISCHARGE SUMMARY   DISCHARGE DIAGNOSES: 1. Acute lateral ST elevation myocardial infarction with secondary to     occluded circumflex status post bare-metal stent/dilatation of     heavily clotted vessel distally, with residual diagonal/patent     ductus arteriosus disease, for continued medical therapy.     a.     Peak troponin 15.87.     b.     Left ventricular function depressed at cath with ejection      fraction of 40-45% with 2-D echocardiogram demonstrating ejection      fraction of 55-60%, December 02, 2010. 2. Hypotension. 3. Sinus bradycardia, tolerating low-dose beta-blocker. 4. Hyperlipidemia with total cholesterol of 234, triglycerides 79, HDL     40, LDL 178, initiated on Crestor. 5. Non-Hodgkin lymphoma, followed by Dr. Malen Gauze at [facility]. 6. Pancytopenia, felt possibly secondary to chemotherapy. 7. Hypertension. 8. History of transient ischemic attacks.  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 EKG showed 1 mm ST elevation in leads V5-V6 with Q-waves inferiorly and T-wave inversions laterally.  EMS was called to bring Austin [name] to [place] ED for further evaluation.  EMS repeated Austin EKG and called this as STEMI.  Dr. Riley Kill proceeded with cardiac catheterization upon Austin [name]'s arrival which demonstrated occluded circumflex which was successfully stented with a non drug-eluting stent, along with dilatation of heavily clotted vessel distally.  There is residual  high- grade disease in Austin diagonal as well as Austin PDA, please see full report.  Dr. Riley Kill reviewed these specimens with his colleagues and was felt that his residual disease would best be managed medically.  EF was 40-45% during catheterization with 2-D echocardiogram and follow up demonstrated EF of 55-65% with mild LVH.  His cardiac enzymes came back positive with peak troponin of 15.87.  Lipid panel also came back with findings above.  He was initiated on aspirin, low-dose beta-blocker given his bradycardia as well as statin therapy.  He did have some hypotension, so blood pressures into Austin 90s systolic, so Dr. Daleen Squibb cut down his Cardura and just continued his ACE inhibitor on December 03, 2010. Thereafter Austin [name]'s blood pressures remained in Austin low 100s.  He was seen by Cardiac Rehab and ambulated without chest pain.  He is extensively educated regarding his stent, Plavix, risk factors, exercise guidelines and nitroglycerin use.  He had a P2Y12 panel drawn which showed PRU of 243.  This was reviewed with Dr. Riley Kill on day of discharge who felt this was possibly related to Austin chemotherapy and that he should be continued on Plavix.  On Austin day of discharge, Austin [name] was doing well and  was ambulating independently without any difficulty.  Dr. Riley Kill has seen and examined him today and feels he is stable for discharge.  DISCHARGE LABORATORY FINDINGS:  WBC is 3.2, hemoglobin 10.3, hematocrit 28.9, platelet count 114.  This was reviewed with Dr. Riley Kill and felt possibly secondary to chemotherapy, and he will get a repeat CBC in 1 week.  P2y12 PRU 243, function baseline to 89% inhibition was seen. Sodium 133, potassium 3.9, chloride 101, CO2 26, glucose 100, BUN 16, creatinine 1.19.  LFTs were normal on December 02, 2010, with exception of AST of 85, hemoglobin A1c of 5.9.  Total cholesterol panel as above.  STUDIES: 1. Chest x-ray December 04, 2010, showed no active disease. 2.  Cardiac catheterization December 02, 2010, please see full report for     details as well as hospital course for summary. 3. A 2-D echocardiogram December 02, 2010, showed EF of 55-65% with mild     LVH, no significant valvular disease.  DISCHARGE MEDICATIONS: 1. Amlodipine 5 mg daily. 2. Aspirin 81 mg daily. 3. Plavix 75 mg daily. 4. Lopressor 25 mg half tablet b.i.d. 5. Nitroglycerin sublingual 0.4 mg every 5 minutes as needed up to 3     doses. 6. Crestor 40 mg nightly. 7. Doxazosin 2 mg nightly. 8. Chemotherapy regimen. 9. Gabapentin 100 mg daily.  DISPOSITION:  Austin [name] will be discharged in stable condition to home.  He is to increase activity slowly, not to lift anything over 5 pounds for 1 week.  He is not to drive for 2 weeks, to participate in sexual activity for 6 weeks per Dr. Riley Kill.  He is to follow a low- sodium heart-healthy diet and to call or return if he notices any pain, swelling, bleeding or pus at Austin cath site.  He will follow up with Dr. Riley Kill on December 13, 2010, at 10:15 a.m. and will also get a CBC at that visit to ensure stability of his blood counts.  DURATION OF DISCHARGE ENCOUNTER:  Greater than 30 minutes including physician and PA time.     Dayna Dunn, P.A.C.   ______________________________ Arturo Morton Riley Kill, MD, [facility]    DD/MEDQ  D:  12/05/2010  T:  12/06/2010  Job:  [id number]  cc:   Arturo Morton. Riley Kill, MD, [facility] Dr. Malen Gauze Dr. Clearance Coots  Electronically Signed by Ronie Spies  on 12/06/2010 12:21:55 PM Electronically Signed by Shawnie Pons MD [facility] on 12/08/2010 05:41:53 AM

## 2010-12-08 NOTE — Procedures (Signed)
NAMEMarland Kitchen  Austin, [name] NO.:  [id number]  MEDICAL RECORD NO.:  [id number]           PATIENT TYPE:  I  LOCATION:  2906                         FACILITY:  [facility]  PHYSICIAN:  Arturo Morton. 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  He had pain that lasted for approximately 5 days off and on.  It was actually worse yesterday but he called and was brought by EMS to the emergency room today with ongoing chest pain.  EKGs were borderline but were repeated in the catheterization laboratory which demonstrated about a mm of ST elevation in V6.  Urgent catheterization was recommended.  Importantly, the patient has had recent shingles.  He has also had evidence of non- Hodgkin lymphoma treated with chemotherapy.  The current study was done as an urgent catheterization.  PROCEDURE: 1. Left heart catheterization. 2. Selective coronary arteriography. 3. Selective left ventriculography. 4. Aspiration thrombectomy. 5. Percutaneous intervention of the circumflex coronary artery.  DESCRIPTION OF PROCEDURE:  The patient was brought to the cath lab urgently.  EKG was done and confirmed.  He was prepped and draped in usual fashion.  Through an anterior puncture, the femoral artery was entered.  A 3000 units of intravenous heparin was given on arrival and he had had chewable aspirin.  We entered the right femoral artery easily and a 6-French sheath was placed.  Diagnostic views of the right coronary artery were then obtained followed by using a guiding catheter in the left.  He had slow flow in the circumflex with subtotal occlusion and very slow distal flow.  There was tight disease in the diagonal branches.  Urgent intervention of the circumflex was  then undertaken. Bivalirudin was given according to protocol.  The patient has a history of prior TIA and he was given oral Plavix, 600 mg was given.  An ACT was checked and found to be appropriate.  Labs have been sent.  I-STAT creatinine was confirmed.  The lesion was then crossed with a traverse wire.  We did some predilatation and we were able to open up a proximal lesion distally.  There was extensive clot in a large portion of the inferior branch of the marginal distally.  Aspiration thrombectomy was performed.  A large amount of aspirated clot was then removed.  A second pass was performed.  Following this, a small 2-mm balloon was placed distally and multiple dilatations done to reestablish flow.  There were fairly high-grade lesions distally.  There was subtotal lesion proximally and a somewhat more 50-60% lesion in the proximal portion of the marginal.  Because of his recent chemotherapy, uncertain status of his non-Hodgkin lymphoma, the plan at this point in time was to stent the proximal subtotal lesion.  A 20 mm x 2.75 Veriflex stent was then placed in a proximal artery, and a stent deployed with marked improvement in the appearance.  We considered stenting distally, but we elected not to.  With this, multiple dilatations were performed.  There was reestablishment of blood flow distally.  Several doses of intracoronary verapamil and nitroglycerin were administered.  The patient had some clot distally as well with a fairly large clot burden in the distal vessel but reestablishment of TIMI-3 flow.  As a result and given his 5-day presentation with mild ST elevation, it was elected not to perform any further procedures.  All catheters were subsequently removed and the femoral sheath sewn into place.  Bivalirudin  was continued at 0.25.  He was taken to the holding area.  HEMODYNAMIC DATA: 1. Central aortic pressure 134/81, mean 104. 2. LV pressure 147/12. 3. There was no  gradient or pullback across the aortic valve.  ANGIOGRAPHIC DATA: 1. The right coronary artery demonstrates about 40% area proximally.     There is some diffuse luminal irregularities.  The PDA has at least     2 lesions of 80%, this is a small-caliber vessel.  The continuation     branch has about 70% leading into the posterolateral segment.  Both     posterolateral vessels were moderate in size.  Of note, the     proximal PDA also has about 50% proximal narrowing as well. 2. The left main is free of disease. 3. The LAD has about 60% area of eccentric plaquing proximally.  There     is then a large diagonal which has 2 branches, superior branch with     95% narrowing and inferior branch was 70%.  The superior branch is     somewhat smaller in caliber being just a 1.5-mm artery and the     inferior branch has about 70% segmental narrowing. 4. The circumflex coronary artery demonstrates a marginal with 50%     narrowing and then slow flow.  There is 95% narrowing with TIMI-1     flow and the distal vessel was not well seen.  Following     reperfusion, this is opened up and there are 2 sub-branches     distally.  The superior subbranch has some mild clotting.  In the     proximal vessel and distally, there is extensive clot which was     treated with thrombectomy.  This area was then dilated and there     were 2 fairly high-grade lesions of 70-80%.  After dilatation, the     proximal circumflex lesion was stented with a 0% residual narrowing     and the distal circumflex lesions had about 30% narrowing in both     lesions with evidence of some residual disruption in the more     proximal of 2 lesions.  There is also a very proximal marginal     lesion with about 50% narrowing, but we elected not to stent this     in the current environment.  The AV circumflex has a second     marginal with about 50-60% narrowing as well. 5. Ventriculography.  Ventriculography in the RAO projection  reveals     overall ejection fraction of around 40-45% in the anterolateral     segment and apical segment is severely hypokinetic.  The rest of     the ventricle moves well.  No significant mitral regurgitation is     noted.  CONCLUSIONS: 1. Acute lateral wall infarction secondary to occlusion of the     circumflex with successful percutaneous stenting with a non drug-     eluting stent and dilatation of heavily clotted  vessel distally. 2. Residual high-grade disease involving the diagonals. 3. Diffuse disease of the posterior descending branch as noted above. 4. Scattered lesions as noted in the report.  DISPOSITION:  At the present time given his other problems, aggressive medical therapy will be recommended unless he has recurrent ischemic pain.  We will review the films regarding his diagonals with my colleagues.     Arturo Morton. Riley Kill, MD, [facility]     TDS/MEDQ  D:  12/02/2010  T:  12/03/2010  Job:  [id number]  Electronically Signed by Shawnie Pons MD [facility] on 12/08/2010 05:41:50 AM

## 2010-12-10 ENCOUNTER — Encounter: Payer: Self-pay | Admitting: Cardiology

## 2010-12-12 ENCOUNTER — Other Ambulatory Visit: Payer: Medicare PPO | Admitting: *Deleted

## 2010-12-13 ENCOUNTER — Encounter: Payer: Self-pay | Admitting: Cardiology

## 2010-12-13 ENCOUNTER — Ambulatory Visit (INDEPENDENT_AMBULATORY_CARE_PROVIDER_SITE_OTHER): Payer: Medicare PPO | Admitting: Cardiology

## 2010-12-13 DIAGNOSIS — I2510 Atherosclerotic heart disease of native coronary artery without angina pectoris: Secondary | ICD-10-CM | POA: Insufficient documentation

## 2010-12-13 DIAGNOSIS — I10 Essential (primary) hypertension: Secondary | ICD-10-CM

## 2010-12-13 DIAGNOSIS — I25118 Atherosclerotic heart disease of native coronary artery with other forms of angina pectoris: Secondary | ICD-10-CM | POA: Insufficient documentation

## 2010-12-13 DIAGNOSIS — E7800 Pure hypercholesterolemia, unspecified: Secondary | ICD-10-CM

## 2010-12-13 DIAGNOSIS — D696 Thrombocytopenia, unspecified: Secondary | ICD-10-CM

## 2010-12-13 DIAGNOSIS — E785 Hyperlipidemia, unspecified: Secondary | ICD-10-CM

## 2010-12-13 DIAGNOSIS — I25119 Atherosclerotic heart disease of native coronary artery with unspecified angina pectoris: Secondary | ICD-10-CM | POA: Insufficient documentation

## 2010-12-13 LAB — CBC WITH DIFFERENTIAL/PLATELET
Basophils Relative: 0.5 % (ref 0.0–3.0)
Eosinophils Absolute: 0.1 10*3/uL (ref 0.0–0.7)
Eosinophils Relative: 3.1 % (ref 0.0–5.0)
Lymphocytes Relative: 22.3 % (ref 12.0–46.0)
Monocytes Relative: 13.4 % — ABNORMAL HIGH (ref 3.0–12.0)
Neutrophils Relative %: 60.7 % (ref 43.0–77.0)
RBC: 3.73 Mil/uL — ABNORMAL LOW (ref 4.22–5.81)
WBC: 3 10*3/uL — ABNORMAL LOW (ref 4.5–10.5)

## 2010-12-13 NOTE — Assessment & Plan Note (Signed)
Check platelets.  Now on plavix post MI.  Had BMS because of lymphoma history.

## 2010-12-13 NOTE — Assessment & Plan Note (Signed)
See cath report.  Has scattered CAD.  Information is in [place].  Plan GXT to assess residual ischemia.  Cardiac rehab planned.  Continue medical therapy.

## 2010-12-13 NOTE — Assessment & Plan Note (Signed)
Now on aggressive lipid lowering post MI.

## 2010-12-13 NOTE — Assessment & Plan Note (Addendum)
Meds have been changed.  Controlled at present.

## 2010-12-13 NOTE — Patient Instructions (Signed)
Your physician has requested that you have an exercise tolerance test. For further information please visit [email or web address]. Please also follow instruction sheet, as given.  Your physician recommends that you continue on your current medications as directed. Please refer to the Current Medication list given to you today.  

## 2010-12-13 NOTE — Progress Notes (Signed)
HPI:  Doing well since discharge.  No chest pain or shortness of breath.  Anatomy reviewed with patient and wife.  No new symptoms.  Current Outpatient Prescriptions  Medication Sig Dispense Refill  . amLODipine-benazepril (LOTREL) 5-20 MG per capsule Take 1 capsule by mouth daily.        . clopidogrel (PLAVIX) 75 MG tablet Take 75 mg by mouth daily.        . metoprolol tartrate (LOPRESSOR) 25 MG tablet Take 12.5 mg by mouth 2 (two) times daily.        . nitroGLYCERIN (NITROSTAT) 0.4 MG SL tablet Place 0.4 mg under the tongue every 5 (five) minutes as needed.        . rosuvastatin (CRESTOR) 40 MG tablet Take 40 mg by mouth daily.        Marland Kitchen DISCONTD: doxazosin (CARDURA) 4 MG tablet Take 4 mg by mouth at bedtime.        Marland Kitchen DISCONTD: gabapentin (NEURONTIN) 100 MG capsule Take 1 capsule (100 mg total) by mouth daily.  30 capsule  1  . DISCONTD: mupirocin (BACTROBAN) 2 % ointment Apply 1 application topically 2 (two) times daily.          No Known Allergies  Past Medical History  Diagnosis Date  . History of nephrolithiasis     Dr. Vonita Moss  . 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  . Allergy   . HTN (hypertension)   . Hyperlipidemia     NMR Lipoprofile 2008: LDL 168 ( 2410/ 1826), HDL 41, TG 71. LDL goal = <100, ideally <  70. Framingham Study LDL goal = < 130.    Past Surgical History  Procedure Date  . Esophagela stricture     colonoscopy x2 EGD 2002  . Knee arthroscopy     Family History  Problem Relation Age of Onset  . Cancer Mother     lung cancer  . Diabetes Mother   . Hyperlipidemia Mother   . Stroke Father     age 53  . Cancer Sister     ?lymphoma    History   Social History  . Marital Status: Married    Spouse Name: N/A    Number of Children: N/A  . Years of Education: N/A   Occupational History  . Not on file.   Social History Main Topics  . Smoking status: Never Smoker   . Smokeless tobacco: Not on file  . Alcohol Use: No  . Drug Use: No  .  Sexually Active: Not on file   Other Topics Concern  . Not on file   Social History Narrative  . No narrative on file    ROS: Please see the HPI.  All other systems reviewed and negative.  PHYSICAL EXAM:  BP 110/70  Pulse 49  Ht 6\' 1"  (1.854 m)  Wt 194 lb (87.998 kg)  BMI 25.60 kg/m2  General: Well developed, well nourished, in no acute distress. Head:  Normocephalic and atraumatic. Neck: no JVD Lungs: Clear to auscultation and percussion. Heart: Normal S1 and S2.  No murmur, rubs or gallops.  Abdomen:  Normal bowel sounds; soft; non tender; no organomegaly Pulses: Pulses normal in all 4 extremities.  Groin looks good.  No bruit. Extremities: No clubbing or cyanosis. No edema. Neurologic: Alert and oriented x 3.  EKG:  ASSESSMENT AND PLAN:  NSR.  STE and TWI in V5,6 consistent with recent infarct.

## 2010-12-29 ENCOUNTER — Encounter: Payer: Self-pay | Admitting: Internal Medicine

## 2011-01-10 NOTE — Assessment & Plan Note (Signed)
[facility]                        [facility] OFFICE NOTE   TOMOYA, RINGWALD                   MRN:          [id number]  DATE:01/01/2007                            DOB:          1946/06/25    Chief Alfredo Bach seen Jan 01, 2007 to evaluate atypical chest pain which he  attributed to his doxazosin having been changed to a different format.  He felt that the two-tone colored doxazosin caused burning along the  left sternal border.  He took this for 3 days and then had them replace  it with his original doxazosin.  The symptoms have seemed to improve  since that time & at this time, he has just a residual soreness at the  left sternal border.  He denies any chest pain, nausea, diaphoresis, or  shortness of breath.  He uses the treadmill every other day, and this  has not exacerbated the symptoms.   His past history includes questionable stroke in 2001.  Follicular  lymphoma was diagnosed in 2003 and he is followed in [place] with  outpatient therapy by Lucita Ferrara, M.D.  Colonoscopy in 2000 was  negative.  Upper endo in 2002 revealed stricture.  He has had  arthroscopy of the knee.  Remotely, he has had hematuria evaluated by  Dr. Maretta Bees. Vonita Moss, urologist; no pathology was noted.   Father had stroke.  Mother had diabetes.  Sister had lymphoma.   He has never smoked and does not drink.  Questionable reaction to  silver/metal.   1. He is presently on Lotrel 520.  2. Multivitamins.  3. Doxazosin 4 mg.   He denies any active GI symptomatology at this time.  Specifically, he  denies melena, dyspepsia, or dysphagia.   EXAM:  His weight is actually up 7 pounds to 211.6.  He is afebrile.  Pulse of 68, respiratory 16, blood pressure 110/70.  He has no icterus.  He has no lymphadenopathy about the head, neck, or  axilla.  Thyroid is normal to palpation.  He has a harsh grade 1-1/2 to 2 systolic murmur at the left base.  CHEST:   Clear.  He has no organomegaly.  Homan sign is negative and all  pulses are intact.   ELECTROCARDIOGRAM:  Reveals nonspecific ST-T wave changes in I, aVL, and  V6 which have been present on prior EKGs.   On review of the chart, he did have significant dyslipidemia with an LDL  of 192 in October of 4098.  He has not returned to follow up this risk  factor.  Additionally, he has had hyperglycemia on isolated occasions in  the past.   I will schedule fasting MMR LipoProfile to optimally assess the lipids,  and also an A1c.  The murmur appears to be louder than previously noted.  I truly believe that the question of a generic doxazosin causing his  symptoms is a red herring & other process such as possibly ruptured  small chordae tendinea must be considered.  He has no symptoms of  paroxysmal nocturnal dyspnea, edema, or shortness of breath.  I will  schedule an echocardiogram and ask  him to see me after his lipids and  echo are back.  He should go to the emergency room should the symptoms  recur and progress.     Titus Dubin. Alwyn Ren, MD,FACP,FCCP  Electronically Signed    [facility]/MedQ  DD: 01/01/2007  DT: 01/01/2007  Job #: [id number]

## 2011-01-13 NOTE — H&P (Signed)
[place]. [facility]  Patient:    Austin [name], Austin [name] Visit Number: [id number] MRN: [id number]          Service Type: SUR Location: [id number] Attending Physician:  Josie Saunders Dictated by:   Danae Orleans Venetia Maxon, M.D. Admit Date:  12/31/2001                           History and Physical  REASON FOR ADMISSION:  Headache and intracranial hemorrhage.  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  He thought he had some food poisoning, he felt poorly all week, then had increased headache four days ago.  He again had a headache today and went to [facility] for head CT, which was positive for a small right basal ganglia hemorrhage with intraventricular blood.  There was no shift, no hydrocephalus, no mass effect, no evidence of subarachnoid hemorrhage.  He has a history of lymphadenopathy with questionable lymphoma, with a long-standing history of hypertension.  He saw his oncologist yesterday and had an unremarkable evaluation at that point.  PAST MEDICAL HISTORY:  His past medical history additionally is significant for Barretts esophagus and long history of gastroesophageal reflux disease.  MEDICATIONS: 1. Lotrel 5/20 q.d. 2. Cardura 4 mg q.d.  ALLERGIES:  No known drug allergies.  PHYSICAL EXAMINATION:  GENERAL:  The patient is awake, alert and conversant.  He does have meningismus.  He has no photophobia.  HEENT:  Negative.  CHEST:  Clear to auscultation.  HEART:  Regular rate and rhythm without murmur.  ABDOMEN:  Soft and nontender.  Active bowel sounds.  No hepatosplenomegaly appreciated.  EXTREMITIES:  Without edema, clubbing or cyanosis.  Intact pedal pulses.  NEUROLOGIC:  He is awake, alert and fully oriented.  He has clear and fluent speech.  Cranial  nerves II-XII are normal and no photophobia.  He has full strength in bilateral upper and lower extremities and all motor groups are bilaterally symmetric.  Sensory examination is intact to light touch in his upper and lower extremities.  Deep tendon reflexes are 2 in the biceps, triceps and brachioradialis, 2 at the knees and 2 at the ankles.  Toes are downgoing to plantar stimulation.  Negative Hoffmanns sign.  Cerebellar examination is normal.  IMPRESSION:  The patient is a 65 year old man with a one-week history of headache, worse over the last four days.  There is a long history of hypertension.  Blood on CT is consistent with a right basal ganglia to right frontal horn intraventricular extension.  There is no evidence of subarachnoid hemorrhage.  He has mild meningismus.  He is to be observed in intensive care unit.  We will get an MRI and MRA in the a.m.  Aneurysm is unlikely but will get MRA to assess cerebral vasculature.  We will monitor his blood pressure closely. Dictated by:   Danae Orleans Venetia Maxon, M.D. Attending Physician:  Josie Saunders DD:  12/31/01 TD:  01/01/02 Job: [id number]

## 2011-01-13 NOTE — Procedures (Signed)
[facility]  Patient:    Austin [name], Austin [name] Visit Number: [id number] MRN: [id number]          Service Type: OUT Location: OMED Attending Physician:  Jim Desanctis Dictated by:   Rose Phi. Myna Hidalgo, M.D. Proc. Date: 10/09/01 Admit Date:  10/09/2001                             Procedure Report  PROCEDURE:  Left posterior iliac crest bone marrow biopsy and aspirate.  DESCRIPTION OF PROCEDURE:  [name] was brought to the [facility].  He had an IV placed.  He was placed on monitor.  All of his vital signs were stable.  He was placed onto his right side.  He was given a total of 10 mg Versed IV for sedation.  The left posterior iliac crest region was prepped and draped in a sterile fashion.  Then 10 cc of 2% lidocaine were infiltrated under the skin and down to the periosteum.  A #11 scalpel was used to make an incision into the skin.  Two aspirates were obtained without difficulties.  A second incision was made into the skin.  Two bone marrow biopsy cores were obtained without difficulties.  The patient tolerated the procedure well.  He was very nicely sedated.  There was no bleeding complications. Dictated by:   Rose Phi. Myna Hidalgo, M.D. Attending Physician:  Jim Desanctis DD:  10/09/01 TD:  10/09/01 Job: [id number]

## 2011-01-13 NOTE — Op Note (Signed)
[facility]  Patient:    SOPHEAP, BOEHLE Visit Number: [id number] MRN: [id number]          Service Type: DSU Location: DAY Attending Physician:  Shelly Rubenstein Dictated by:   Abigail Miyamoto, M.D. Proc. Date: 09/17/01 Admit Date:  09/17/2001   CC:         Titus Dubin. Alwyn Ren, M.D. [facility]                           Operative Report  PREOPERATIVE DIAGNOSIS:  Lymphadenopathy.  POSTOPERATIVE DIAGNOSIS:  Lymphadenopathy.  PROCEDURE:  Excision of left groin lymph node.  SURGEON:  Abigail Miyamoto, M.D.  ANESTHESIA:  One percent lidocaine and monitored anesthesia care.  ESTIMATED BLOOD LOSS:  Minimal.  DESCRIPTION OF PROCEDURE:  The patient was brought to the operating room and identified as Ferol Luz.  He was placed supine on the operating room table and anesthesia was induced.  His right groin was then prepped and draped in the usual sterile fashion.  Using 1% lidocaine, an area over the palpable lymph node was anesthetized.  A small longitudinal incision was then made in the left groin.  The incision was carried down to two large palpable lymph nodes which were then completely ______ circumferentially with electrocautery.  The feeding vessel was tied off with a 3-0 Vicryl tie.  The lymph nodes were then sent to pathology for identification.  The wound was then irrigated.  The subcutaneous layer was then closed with interrupted 3-0 Vicryl suture and the skin was closed with a running 4-0 Monocryl. Steri-Strips, gauze, and Tegaderm were then applied.  The patient tolerated the procedure well.  All sponge, needle, and instrument counts were correct at the end of the procedure.  The patient was then taken in stable condition from the operating room to the recovery room. Dictated by:   Abigail Miyamoto, M.D. Attending Physician:  Shelly Rubenstein DD:  09/17/01 TD:  09/18/01 Job: [id number]

## 2011-01-19 ENCOUNTER — Ambulatory Visit (INDEPENDENT_AMBULATORY_CARE_PROVIDER_SITE_OTHER): Payer: Medicare PPO | Admitting: Cardiology

## 2011-01-19 DIAGNOSIS — E7800 Pure hypercholesterolemia, unspecified: Secondary | ICD-10-CM

## 2011-01-19 DIAGNOSIS — I10 Essential (primary) hypertension: Secondary | ICD-10-CM

## 2011-01-19 DIAGNOSIS — I2510 Atherosclerotic heart disease of native coronary artery without angina pectoris: Secondary | ICD-10-CM

## 2011-01-19 NOTE — Progress Notes (Signed)
Exercise Treadmill Test  Pre-Exercise Testing Evaluation Rhythm: sinus bradycardia  Rate: 53   PR:  .19 QRS:  .07  QT:  .43 QTc: .40     Test  Exercise Tolerance Test Ordering MD: Shawnie Pons, MD  Interpreting MD:  Shawnie Pons, MD  Unique Test No: 1  Treadmill:  1  Indication for ETT: CAD  Contraindication to ETT: No   Stress Modality: exercise - treadmill  Cardiac Imaging Performed: non   Protocol: standard Bruce - maximal  Max BP: 162/73  Max MPHR (bpm):  154 85% MPR (bpm):  131  MPHR obtained (bpm):  111 % MPHR obtained:  72%  Reached 85% MPHR (min:sec):   Total Exercise Time (min-sec):  9:00  Workload in METS:  10.1 Borg Scale: 15  Reason ETT Terminated:  fatigue    ST Segment Analysis At Rest: normal ST segments - no evidence of significant ST depression With Exercise: no evidence of significant ST depression  Other Information Arrhythmia:  No Angina during ETT:  absent (0) Quality of ETT:  non-diagnostic  ETT Interpretation:  normal - no evidence of ischemia by ST analysis  Comments: Patient exercised today on the Bruce for nine minutes.  No angina, and no ST segment changes,but not diagnostic secondary to blunted heart rate response pharmacologically.  Did well.   No angina.    Recommendations: Proceed with cardiac rehab at the present time.  Heart rate goal no greater than 110 bpm.

## 2011-01-26 NOTE — H&P (Signed)
NAMEMarland Kitchen  RAMOND, Austin [name]  MEDICAL RECORD NO.:  [id number]           PATIENT TYPE:  I  LOCATION:  2906                         FACILITY:  [facility]  PHYSICIAN:  Austin Morton. Riley Kill, MD, FACCDATE OF BIRTH:  Jun 27, 1946  DATE OF ADMISSION:  12/02/2010 DATE OF DISCHARGE:                             HISTORY & PHYSICAL   PRIMARY CARDIOLOGIST:  New patient to [facility], currently being evaluated by Dr. Riley [name].  PRIMARY CARE PROVIDER:  Dr. Alwyn Ren.  REASON FOR ADMISSION:  Code STEMI.  HISTORY OF PRESENT ILLNESS:  This is a 65 year old African American male with history of hypertension and non-Hodgkin lymphoma but no known coronary artery disease who presented to the Urgent Care for evaluation of chest pain over the past 5 days.  The patient complained of chest heaviness and mild shortness of breath over the past 5 days.  The patient denies any nausea, vomiting, or diaphoresis.  He states that yesterday the pain was worse and today he says it is slightly improved. He went to the Urgent Care as his pain was still constant.  EKG showed a minimal  ST elevation in leads V5 through V6 with Q-waves inferiorly and T- wave inversions laterally.  The EMS was called and the patient was brought to the emergency department for further evaluation.  The EMS repeated an EKG and called a code STEMI.  The patient was brought emergently to the [facility] Cath Lab.  Dr. Riley [name] repeated an EKG that did show 1-mm ST elevations in V5 and V6.  Dr. Riley [name] proceeded with an emergent cardiac catheterization.  The patient received 2 sublingual nitroglycerins and aspirin and his chest pain has currently resolved completely.  PAST MEDICAL HISTORY:  (No known coronary artery disease, diabetes mellitus, or hyperlipidemia.) 1. Hypertension. 2. Non-Hodgkin lymphoma, currently being treated by Dr. Malen Gauze at [facility], last chemotherapy was on March 9. 3. History of  TIAs.  SOCIAL HISTORY:  The patient lives in [place].  He is a retired Emergency planning/management officer.  He denies any tobacco abuse.  FAMILY HISTORY:  Mother had lung cancer.  His father died of a CVA at age 66.  No early coronary artery disease noted.  ALLERGIES:  No known drug allergies.  MEDICATIONS: 1. Doxazosin 4 mg daily. 2. Gabapentin 100 mg daily. 3. Amlodipine/benazepril 5/20 mg daily.  REVIEW OF SYSTEMS:  Unable to obtain secondary to urgent procedure besides all since reviewed in HPI.  PHYSICAL EXAMINATION:  VITAL SIGNS:  Pulse 66, respirations 18, and blood pressure 147/97. GENERAL:  This is a very quiet elderly gentleman.  He is in no distress currently. HEENT:  Normal. HEART:  Regular rate and rhythm with S1 and S2.  No murmur noted. LUNGS:  Clear to auscultation anteriorly.  No wheezes, rales, or rhonchi. ABDOMEN:  Soft and nontender.  Positive bowel sounds x4. EXTREMITIES:  No clubbing, cyanosis, or edema. MUSCULOSKELETAL:  No joint deformities are noted. NEUROLOGIC:  Alert and oriented x3. PHYSICAL EXAMINATION:  Shortened as emergent situation.  Chest x-ray none.  EKG showing sinus rhythm at a rate of 66 beats per minute.  There  are 1-mm ST elevations in V5 and V6 with Q-waves noted inferiorly as well as in V5 and V6, lateral T-wave inversions.  Labs pending.  ASSESSMENT AND PLAN:  65 year old African American gentleman with history of hypertension who presents with 5 days of ongoing chest pain.  Upon evaluation, his EKG does show ST elevation in leads V5 and V6.  Therefore, the patient has undergone emergent catheterization for an acute lateral myocardial infarction.  Dr. Riley [name] will perform cardiac catheterization and possible percutaneous coronary intervention. Emergent consent has been obtained.  Further treatment will be dependent upon catheterization results.  The patient will be placed in the CCU post catheterization.     Leonette Monarch,  PA-C   ______________________________ Austin Morton Riley Kill, MD, [facility]    NB/MEDQ  D:  12/02/2010  T:  12/03/2010  Job:  [id number]  cc:   Dr. Alwyn Ren  Electronically Signed by Alen Blew P.A. on 12/08/2010 02:01:24 PM Electronically Signed by Shawnie Pons MD [facility] on 01/26/2011 09:11:19 AM

## 2011-01-30 ENCOUNTER — Encounter (HOSPITAL_COMMUNITY): Payer: Medicare PPO | Attending: Cardiology

## 2011-01-30 DIAGNOSIS — I2129 ST elevation (STEMI) myocardial infarction involving other sites: Secondary | ICD-10-CM | POA: Insufficient documentation

## 2011-01-30 DIAGNOSIS — C8589 Other specified types of non-Hodgkin lymphoma, extranodal and solid organ sites: Secondary | ICD-10-CM | POA: Insufficient documentation

## 2011-01-30 DIAGNOSIS — Z7902 Long term (current) use of antithrombotics/antiplatelets: Secondary | ICD-10-CM | POA: Insufficient documentation

## 2011-01-30 DIAGNOSIS — E785 Hyperlipidemia, unspecified: Secondary | ICD-10-CM | POA: Insufficient documentation

## 2011-01-30 DIAGNOSIS — Z8673 Personal history of transient ischemic attack (TIA), and cerebral infarction without residual deficits: Secondary | ICD-10-CM | POA: Insufficient documentation

## 2011-01-30 DIAGNOSIS — Z9861 Coronary angioplasty status: Secondary | ICD-10-CM | POA: Insufficient documentation

## 2011-01-30 DIAGNOSIS — I10 Essential (primary) hypertension: Secondary | ICD-10-CM | POA: Insufficient documentation

## 2011-01-30 DIAGNOSIS — I498 Other specified cardiac arrhythmias: Secondary | ICD-10-CM | POA: Insufficient documentation

## 2011-01-30 DIAGNOSIS — Z7982 Long term (current) use of aspirin: Secondary | ICD-10-CM | POA: Insufficient documentation

## 2011-01-30 DIAGNOSIS — Z5189 Encounter for other specified aftercare: Secondary | ICD-10-CM | POA: Insufficient documentation

## 2011-02-01 ENCOUNTER — Ambulatory Visit (HOSPITAL_COMMUNITY)
Admission: RE | Admit: 2011-02-01 | Discharge: 2011-02-01 | Disposition: A | Discharge status: Home / Self Care | Payer: Medicare PPO | Source: Ambulatory Visit | Attending: Cardiology | Admitting: Cardiology

## 2011-02-01 ENCOUNTER — Telehealth: Payer: Self-pay | Admitting: *Deleted

## 2011-02-01 ENCOUNTER — Encounter (HOSPITAL_COMMUNITY): Payer: Medicare PPO

## 2011-02-01 DIAGNOSIS — R9431 Abnormal electrocardiogram [ECG] [EKG]: Secondary | ICD-10-CM | POA: Insufficient documentation

## 2011-02-01 NOTE — Telephone Encounter (Signed)
Byrd Hesselbach RN from [facility] called regarding pt. Having st elevation changes on strips in rehab last week and today. Patient denies any symptoms. Strips faxed to office for Dr. Riley Kill to view. After Dr. Riley Kill reviewed strips, he order for Byrd Hesselbach to do a 12 lead EKG, Order for 12 lead EKG was faxed to [facility].  E[facility] done at [facility] and faxed to office for MD to read.

## 2011-02-03 ENCOUNTER — Encounter (HOSPITAL_COMMUNITY): Payer: Medicare PPO

## 2011-02-06 ENCOUNTER — Encounter (HOSPITAL_COMMUNITY): Payer: Medicare PPO

## 2011-02-08 ENCOUNTER — Encounter (HOSPITAL_COMMUNITY): Payer: Medicare PPO

## 2011-02-10 ENCOUNTER — Encounter (HOSPITAL_COMMUNITY): Payer: Medicare PPO

## 2011-02-13 ENCOUNTER — Encounter (HOSPITAL_COMMUNITY): Payer: Medicare PPO

## 2011-02-15 ENCOUNTER — Telehealth: Payer: Self-pay | Admitting: Cardiology

## 2011-02-15 ENCOUNTER — Encounter (HOSPITAL_COMMUNITY): Payer: Medicare PPO

## 2011-02-15 NOTE — Telephone Encounter (Signed)
Informed patient that he could take Amoxicillin.

## 2011-02-15 NOTE — Telephone Encounter (Signed)
Pt was given rx for amoxcyciline 500 mg for infection in mouth, wants to make sure ok to take

## 2011-02-17 ENCOUNTER — Encounter (HOSPITAL_COMMUNITY): Payer: Medicare PPO

## 2011-02-20 ENCOUNTER — Encounter (HOSPITAL_COMMUNITY): Payer: Medicare PPO

## 2011-02-22 ENCOUNTER — Encounter (HOSPITAL_COMMUNITY): Payer: Medicare PPO

## 2011-02-24 ENCOUNTER — Encounter (HOSPITAL_COMMUNITY): Payer: Medicare PPO

## 2011-02-27 ENCOUNTER — Encounter (HOSPITAL_COMMUNITY): Payer: Medicare PPO

## 2011-02-27 ENCOUNTER — Other Ambulatory Visit: Payer: Self-pay | Admitting: Internal Medicine

## 2011-03-01 ENCOUNTER — Encounter (HOSPITAL_COMMUNITY): Payer: Medicare PPO | Attending: Cardiology

## 2011-03-01 DIAGNOSIS — C8589 Other specified types of non-Hodgkin lymphoma, extranodal and solid organ sites: Secondary | ICD-10-CM | POA: Insufficient documentation

## 2011-03-01 DIAGNOSIS — Z8673 Personal history of transient ischemic attack (TIA), and cerebral infarction without residual deficits: Secondary | ICD-10-CM | POA: Insufficient documentation

## 2011-03-01 DIAGNOSIS — I10 Essential (primary) hypertension: Secondary | ICD-10-CM | POA: Insufficient documentation

## 2011-03-01 DIAGNOSIS — I498 Other specified cardiac arrhythmias: Secondary | ICD-10-CM | POA: Insufficient documentation

## 2011-03-01 DIAGNOSIS — E785 Hyperlipidemia, unspecified: Secondary | ICD-10-CM | POA: Insufficient documentation

## 2011-03-01 DIAGNOSIS — Z7902 Long term (current) use of antithrombotics/antiplatelets: Secondary | ICD-10-CM | POA: Insufficient documentation

## 2011-03-01 DIAGNOSIS — Z9861 Coronary angioplasty status: Secondary | ICD-10-CM | POA: Insufficient documentation

## 2011-03-01 DIAGNOSIS — I2129 ST elevation (STEMI) myocardial infarction involving other sites: Secondary | ICD-10-CM | POA: Insufficient documentation

## 2011-03-01 DIAGNOSIS — Z5189 Encounter for other specified aftercare: Secondary | ICD-10-CM | POA: Insufficient documentation

## 2011-03-01 DIAGNOSIS — Z7982 Long term (current) use of aspirin: Secondary | ICD-10-CM | POA: Insufficient documentation

## 2011-03-02 ENCOUNTER — Telehealth: Payer: Self-pay | Admitting: Cardiology

## 2011-03-02 MED ORDER — ROSUVASTATIN CALCIUM 40 MG PO TABS: 40.0000 mg | ORAL_TABLET | Freq: Every day | ORAL | Status: DC

## 2011-03-02 MED ORDER — DOXAZOSIN MESYLATE 2 MG PO TABS: 2.0000 mg | ORAL_TABLET | Freq: Every day | ORAL | Status: DC

## 2011-03-02 NOTE — Telephone Encounter (Signed)
RX resent into pharmacy Pt notified

## 2011-03-02 NOTE — Telephone Encounter (Signed)
Pt needs refill on crestor 40mg  qd and doxazosin 2mg  qd called into [facility] on [address] he is out and needs refill asap

## 2011-03-03 ENCOUNTER — Encounter (HOSPITAL_COMMUNITY): Payer: Medicare PPO

## 2011-03-06 ENCOUNTER — Encounter (HOSPITAL_COMMUNITY): Payer: Medicare PPO

## 2011-03-08 ENCOUNTER — Encounter (HOSPITAL_COMMUNITY): Payer: Medicare PPO

## 2011-03-10 ENCOUNTER — Ambulatory Visit: Payer: Medicare PPO | Admitting: Cardiology

## 2011-03-10 ENCOUNTER — Encounter (HOSPITAL_COMMUNITY): Payer: Medicare PPO

## 2011-03-13 ENCOUNTER — Encounter (HOSPITAL_COMMUNITY): Payer: Medicare PPO

## 2011-03-15 ENCOUNTER — Encounter (HOSPITAL_COMMUNITY): Payer: Medicare PPO

## 2011-03-17 ENCOUNTER — Encounter (HOSPITAL_COMMUNITY): Payer: Medicare PPO

## 2011-03-20 ENCOUNTER — Encounter (HOSPITAL_COMMUNITY): Payer: Medicare PPO

## 2011-03-22 ENCOUNTER — Encounter (HOSPITAL_COMMUNITY): Payer: Medicare PPO

## 2011-03-24 ENCOUNTER — Encounter (HOSPITAL_COMMUNITY): Payer: Medicare PPO

## 2011-03-27 ENCOUNTER — Encounter (HOSPITAL_COMMUNITY): Payer: Medicare PPO

## 2011-03-29 ENCOUNTER — Encounter (HOSPITAL_COMMUNITY): Payer: Medicare PPO | Attending: Cardiology

## 2011-03-29 DIAGNOSIS — I498 Other specified cardiac arrhythmias: Secondary | ICD-10-CM | POA: Insufficient documentation

## 2011-03-29 DIAGNOSIS — E785 Hyperlipidemia, unspecified: Secondary | ICD-10-CM | POA: Insufficient documentation

## 2011-03-29 DIAGNOSIS — Z8673 Personal history of transient ischemic attack (TIA), and cerebral infarction without residual deficits: Secondary | ICD-10-CM | POA: Insufficient documentation

## 2011-03-29 DIAGNOSIS — Z9861 Coronary angioplasty status: Secondary | ICD-10-CM | POA: Insufficient documentation

## 2011-03-29 DIAGNOSIS — I2129 ST elevation (STEMI) myocardial infarction involving other sites: Secondary | ICD-10-CM | POA: Insufficient documentation

## 2011-03-29 DIAGNOSIS — I10 Essential (primary) hypertension: Secondary | ICD-10-CM | POA: Insufficient documentation

## 2011-03-29 DIAGNOSIS — Z7902 Long term (current) use of antithrombotics/antiplatelets: Secondary | ICD-10-CM | POA: Insufficient documentation

## 2011-03-29 DIAGNOSIS — Z7982 Long term (current) use of aspirin: Secondary | ICD-10-CM | POA: Insufficient documentation

## 2011-03-29 DIAGNOSIS — C8589 Other specified types of non-Hodgkin lymphoma, extranodal and solid organ sites: Secondary | ICD-10-CM | POA: Insufficient documentation

## 2011-03-29 DIAGNOSIS — Z5189 Encounter for other specified aftercare: Secondary | ICD-10-CM | POA: Insufficient documentation

## 2011-03-30 ENCOUNTER — Telehealth: Payer: Self-pay | Admitting: Cardiology

## 2011-03-30 ENCOUNTER — Encounter: Payer: Self-pay | Admitting: Cardiology

## 2011-03-30 ENCOUNTER — Ambulatory Visit (INDEPENDENT_AMBULATORY_CARE_PROVIDER_SITE_OTHER): Payer: Medicare PPO | Admitting: Cardiology

## 2011-03-30 DIAGNOSIS — D649 Anemia, unspecified: Secondary | ICD-10-CM

## 2011-03-30 DIAGNOSIS — I10 Essential (primary) hypertension: Secondary | ICD-10-CM

## 2011-03-30 DIAGNOSIS — E785 Hyperlipidemia, unspecified: Secondary | ICD-10-CM

## 2011-03-30 DIAGNOSIS — I2510 Atherosclerotic heart disease of native coronary artery without angina pectoris: Secondary | ICD-10-CM

## 2011-03-30 DIAGNOSIS — C8589 Other specified types of non-Hodgkin lymphoma, extranodal and solid organ sites: Secondary | ICD-10-CM

## 2011-03-30 NOTE — Telephone Encounter (Signed)
Dr. Riley Kill would like to wait until he reviews the results of Austin [name]'s CBC.

## 2011-03-30 NOTE — Assessment & Plan Note (Signed)
Needs follow up in [facility].  Encouraged to come.

## 2011-03-30 NOTE — Progress Notes (Signed)
HPI:  Still in cardiac rehab and doing well.  He did not keep his [facility] appointment for treatment of his lymphoma after his last maintenance treatment.  He does a little job with industrial cleaning, and was mopping and noted a little tightness, but that is better.  He is not having regular symptoms.  Wanted to lift some weights, but I have discouraged that, and also encouraged him to get back to [facility] for follow up..   Current Outpatient Prescriptions  Medication Sig Dispense Refill  . amLODipine-benazepril (LOTREL) 5-20 MG per capsule Take 1 capsule by mouth daily.        . clopidogrel (PLAVIX) 75 MG tablet Take 75 mg by mouth daily.        Marland Kitchen doxazosin (CARDURA) 2 MG tablet Take 1 tablet (2 mg total) by mouth at bedtime.  30 tablet  6  . metoprolol tartrate (LOPRESSOR) 25 MG tablet 1/2 tab po bid      . nitroGLYCERIN (NITROSTAT) 0.4 MG SL tablet Place 0.4 mg under the tongue every 5 (five) minutes as needed.        . rosuvastatin (CRESTOR) 40 MG tablet Take 1 tablet (40 mg total) by mouth daily.  30 tablet  6    No Known Allergies  Past Medical History  Diagnosis Date  . History of nephrolithiasis     Dr. Vonita Moss  . 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  . [facility]   . HTN (hypertension)   . Hyperlipidemia     NMR Lipoprofile 2008: LDL 168 ( 2410/ 1826), HDL 41, TG 71. LDL goal = <100, ideally <  70. Framingham Study LDL goal = < 130.    Past Surgical History  Procedure Date  . Esophagela stricture     colonoscopy x2 EGD 2002  . Knee arthroscopy     Family History  Problem Relation Age of Onset  . Cancer Mother     lung cancer  . Diabetes Mother   . Hyperlipidemia Mother   . Stroke Father     age 13  . Cancer Sister     ?lymphoma    History   Social History  . Marital Status: Married    Spouse Name: N/A    Number of Children: N/A  . Years of Education: N/A   Occupational History  . Not on file.   Social History Main Topics  . Smoking status: Never Smoker     . Smokeless tobacco: Not on file  . Alcohol Use: No  . Drug Use: No  . Sexually Active: Not on file   Other Topics Concern  . Not on file   Social History Narrative  . No narrative on file    ROS: Please see the HPI.  All other systems reviewed and negative.  PHYSICAL EXAM:  BP 116/69  Pulse 52  Resp 12  Ht 6\' 1"  (1.854 m)  Wt 203 lb (92.08 kg)  BMI 26.78 kg/m2  General: Well developed, well nourished, in no acute distress. Head:  Normocephalic and atraumatic. Neck: no JVD Lungs: Clear to auscultation and percussion. Heart: Normal S1 and S2.  No murmur, rubs or gallops.  Abdomen:  Normal bowel sounds; soft; non tender; no organomegaly Pulses: Pulses normal in all 4 extremities. Extremities: No clubbing or cyanosis. No edema. Neurologic: Alert and oriented x 3.  EKG:  SB.  Nonspecific ST and T change.  No acute changes.  T inversion laterally. ASSESSMENT AND PLAN:

## 2011-03-30 NOTE — Assessment & Plan Note (Signed)
Needed to recheck CBC.

## 2011-03-30 NOTE — Telephone Encounter (Signed)
Per pt call, pt said Dr. Riley Kill and pt talked about pt taking an aspirin a day. Pt told Dr. Riley Kill he wasn't currently taking an aspirin a day. Pt wants to know if he needs to start taking an aspirin a day. Please return pt call to advise/discuss.

## 2011-03-30 NOTE — Assessment & Plan Note (Signed)
Had some mild chest discomfort with a lot of activity, but not on regular basis.  Continue to observe.  If increase frequency, he is to call us.

## 2011-03-30 NOTE — Assessment & Plan Note (Signed)
Need to check lipid and liver.  Had some symptoms early on which he attributed to statins, but not since.

## 2011-03-30 NOTE — Assessment & Plan Note (Signed)
Controlled at present.  

## 2011-03-30 NOTE — Patient Instructions (Signed)
Your physician recommends that you schedule a follow-up appointment in: 3 months  Fasting Labs tomorrow morning.

## 2011-03-31 ENCOUNTER — Encounter (HOSPITAL_COMMUNITY): Payer: Medicare PPO

## 2011-03-31 ENCOUNTER — Other Ambulatory Visit (INDEPENDENT_AMBULATORY_CARE_PROVIDER_SITE_OTHER): Payer: Medicare PPO | Admitting: *Deleted

## 2011-03-31 DIAGNOSIS — D649 Anemia, unspecified: Secondary | ICD-10-CM

## 2011-03-31 DIAGNOSIS — E785 Hyperlipidemia, unspecified: Secondary | ICD-10-CM

## 2011-03-31 LAB — LIPID PANEL
Cholesterol: 117 mg/dL (ref 0–200)
LDL Cholesterol: 52 mg/dL (ref 0–99)
Total CHOL/HDL Ratio: 2

## 2011-03-31 LAB — HEPATIC FUNCTION PANEL
ALT: 21 U/L (ref 0–53)
AST: 28 U/L (ref 0–37)
Albumin: 4.4 g/dL (ref 3.5–5.2)
Alkaline Phosphatase: 72 U/L (ref 39–117)

## 2011-03-31 LAB — CBC WITH DIFFERENTIAL/PLATELET
Basophils Relative: 0.6 % (ref 0.0–3.0)
Eosinophils Relative: 3.7 % (ref 0.0–5.0)
HCT: 38 % — ABNORMAL LOW (ref 39.0–52.0)
Hemoglobin: 12.9 g/dL — ABNORMAL LOW (ref 13.0–17.0)
Lymphs Abs: 0.6 10*3/uL — ABNORMAL LOW (ref 0.7–4.0)
MCV: 88.6 fl (ref 78.0–100.0)
Monocytes Absolute: 0.4 10*3/uL (ref 0.1–1.0)
Neutro Abs: 1.2 10*3/uL — ABNORMAL LOW (ref 1.4–7.7)
Platelets: 89 10*3/uL — ABNORMAL LOW (ref 150.0–400.0)
WBC: 2.4 10*3/uL — ABNORMAL LOW (ref 4.5–10.5)

## 2011-04-03 ENCOUNTER — Encounter (HOSPITAL_COMMUNITY): Payer: Medicare PPO

## 2011-04-05 ENCOUNTER — Encounter (HOSPITAL_COMMUNITY): Payer: Medicare PPO

## 2011-04-07 ENCOUNTER — Encounter (HOSPITAL_COMMUNITY): Payer: Medicare PPO

## 2011-04-10 ENCOUNTER — Encounter (HOSPITAL_COMMUNITY): Payer: Medicare PPO

## 2011-04-12 ENCOUNTER — Encounter (HOSPITAL_COMMUNITY): Payer: Medicare PPO

## 2011-04-14 ENCOUNTER — Encounter (HOSPITAL_COMMUNITY): Payer: Medicare PPO

## 2011-04-17 ENCOUNTER — Encounter (HOSPITAL_COMMUNITY): Payer: Medicare PPO

## 2011-04-19 ENCOUNTER — Encounter (HOSPITAL_COMMUNITY): Payer: Medicare PPO

## 2011-04-21 ENCOUNTER — Encounter (HOSPITAL_COMMUNITY): Payer: Medicare PPO

## 2011-04-24 ENCOUNTER — Encounter (HOSPITAL_COMMUNITY): Payer: Medicare PPO

## 2011-04-26 ENCOUNTER — Encounter (HOSPITAL_COMMUNITY): Payer: Medicare PPO

## 2011-04-28 ENCOUNTER — Encounter (HOSPITAL_COMMUNITY): Payer: Medicare PPO

## 2011-05-01 ENCOUNTER — Encounter (HOSPITAL_COMMUNITY): Payer: Medicare PPO

## 2011-05-03 ENCOUNTER — Encounter (HOSPITAL_COMMUNITY): Payer: Medicare PPO | Attending: Cardiology

## 2011-05-03 DIAGNOSIS — Z7982 Long term (current) use of aspirin: Secondary | ICD-10-CM | POA: Insufficient documentation

## 2011-05-03 DIAGNOSIS — C8589 Other specified types of non-Hodgkin lymphoma, extranodal and solid organ sites: Secondary | ICD-10-CM | POA: Insufficient documentation

## 2011-05-03 DIAGNOSIS — I2129 ST elevation (STEMI) myocardial infarction involving other sites: Secondary | ICD-10-CM | POA: Insufficient documentation

## 2011-05-03 DIAGNOSIS — E785 Hyperlipidemia, unspecified: Secondary | ICD-10-CM | POA: Insufficient documentation

## 2011-05-03 DIAGNOSIS — Z8673 Personal history of transient ischemic attack (TIA), and cerebral infarction without residual deficits: Secondary | ICD-10-CM | POA: Insufficient documentation

## 2011-05-03 DIAGNOSIS — I10 Essential (primary) hypertension: Secondary | ICD-10-CM | POA: Insufficient documentation

## 2011-05-03 DIAGNOSIS — Z7902 Long term (current) use of antithrombotics/antiplatelets: Secondary | ICD-10-CM | POA: Insufficient documentation

## 2011-05-03 DIAGNOSIS — Z9861 Coronary angioplasty status: Secondary | ICD-10-CM | POA: Insufficient documentation

## 2011-05-03 DIAGNOSIS — Z5189 Encounter for other specified aftercare: Secondary | ICD-10-CM | POA: Insufficient documentation

## 2011-05-03 DIAGNOSIS — I498 Other specified cardiac arrhythmias: Secondary | ICD-10-CM | POA: Insufficient documentation

## 2011-05-05 ENCOUNTER — Encounter (HOSPITAL_COMMUNITY): Payer: Medicare PPO

## 2011-05-08 ENCOUNTER — Ambulatory Visit (HOSPITAL_COMMUNITY): Payer: Medicare PPO

## 2011-05-08 ENCOUNTER — Encounter (HOSPITAL_COMMUNITY): Payer: Medicare PPO

## 2011-05-30 ENCOUNTER — Other Ambulatory Visit: Payer: Self-pay | Admitting: Internal Medicine

## 2011-07-04 ENCOUNTER — Encounter: Payer: Self-pay | Admitting: Cardiology

## 2011-07-04 ENCOUNTER — Ambulatory Visit (INDEPENDENT_AMBULATORY_CARE_PROVIDER_SITE_OTHER): Payer: Medicare PPO | Admitting: Cardiology

## 2011-07-04 VITALS — BP 130/80 | HR 60 | Ht 73.0 in | Wt 211.4 lb

## 2011-07-04 DIAGNOSIS — E785 Hyperlipidemia, unspecified: Secondary | ICD-10-CM

## 2011-07-04 NOTE — Assessment & Plan Note (Signed)
He has joint aches which sound classic for a statin mediated class effect.  Will give him a four week drug holiday.  His wife had the same.  Will reassess in four weeks, and if confirmed, consider lower dose therapy with an alternative agent.  Patient is in agreement.

## 2011-07-04 NOTE — Assessment & Plan Note (Signed)
Recent recurrence, being treated at the present time at [facility].

## 2011-07-04 NOTE — Patient Instructions (Signed)
Your physician has recommended you make the following change in your medication:  Stop Crestor  Your physician recommends that you schedule a follow-up appointment in: 4 weeks with Dr. Riley Kill

## 2011-07-04 NOTE — Assessment & Plan Note (Signed)
Symptoms are currently controlled on medical therapy.

## 2011-07-04 NOTE — Progress Notes (Signed)
HPI:  Doing well cardiac wise.  Uses the treamill.  He aches in all of his joints at the present time.  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 He wants to use the pulley system at home.    Current Outpatient Prescriptions  Medication Sig Dispense Refill  . amLODipine (NORVASC) 5 MG tablet Take 5 mg by mouth daily.        . clopidogrel (PLAVIX) 75 MG tablet TAKE ONE TABLET BY MOUTH DAILY WITH FOOD  30 tablet  6  . doxazosin (CARDURA) 2 MG tablet Take 1 tablet (2 mg total) by mouth at bedtime.  30 tablet  6  . metoprolol tartrate (LOPRESSOR) 25 MG tablet 1/2 tab po bid      . nitroGLYCERIN (NITROSTAT) 0.4 MG SL tablet Place 0.4 mg under the tongue every 5 (five) minutes as needed.        . rosuvastatin (CRESTOR) 40 MG tablet Take 1 tablet (40 mg total) by mouth daily.  30 tablet  6    No Known Allergies  Past Medical History  Diagnosis Date  . History of nephrolithiasis     Dr. Vonita Moss  . 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  . Allergy   . HTN (hypertension)   . Hyperlipidemia     NMR Lipoprofile 2008: LDL 168 ( 2410/ 1826), HDL 41, TG 71. LDL goal = <100, ideally <  70. Framingham Study LDL goal = < 130.    Past Surgical History  Procedure Date  . Esophagela stricture     colonoscopy x2 EGD 2002  . Knee arthroscopy     Family History  Problem Relation Age of Onset  . Cancer Mother     lung cancer  . Diabetes Mother   . Hyperlipidemia Mother   . Stroke Father     age 87  . Cancer Sister     ?lymphoma    History   Social History  . Marital Status: Married    Spouse Name: N/A    Number of Children: N/A  . Years of Education: N/A   Occupational History  . Not on file.   Social History Main Topics  . Smoking status: Never Smoker   . Smokeless tobacco: Not on file  . Alcohol Use: No  . Drug Use: No  . Sexually Active: Not on file   Other Topics Concern  . Not on file   Social History Narrative  . No narrative on  file    ROS: Please see the HPI.  All other systems reviewed and negative.  PHYSICAL EXAM:  BP 130/80  Pulse 60  Ht 6\' 1"  (1.854 m)  Wt 211 lb 6.4 oz (95.89 kg)  BMI 27.89 kg/m2  General: Well developed, well nourished, in no acute distress. Head:  Normocephalic and atraumatic. Neck: no JVD Lungs: Clear to auscultation and percussion. Heart: Normal S1 and S2.  No murmur, rubs or gallops.  Abdomen:  Normal bowel sounds; soft; non tender; no organomegaly Pulses: Pulses normal in all 4 extremities. Extremities: No clubbing or cyanosis. No edema. Neurologic: Alert and oriented x 3.  EKG:  NSR.  T wave inversion laterally.  No acute changes.  This is mostly unchanged from before.   ASSESSMENT AND PLAN:cd

## 2011-08-02 ENCOUNTER — Ambulatory Visit (INDEPENDENT_AMBULATORY_CARE_PROVIDER_SITE_OTHER): Payer: Medicare PPO | Admitting: Cardiology

## 2011-08-02 DIAGNOSIS — E785 Hyperlipidemia, unspecified: Secondary | ICD-10-CM | POA: Insufficient documentation

## 2011-08-02 DIAGNOSIS — I2510 Atherosclerotic heart disease of native coronary artery without angina pectoris: Secondary | ICD-10-CM

## 2011-08-02 MED ORDER — PRAVASTATIN SODIUM 20 MG PO TABS: 20.0000 mg | ORAL_TABLET | Freq: Every day | ORAL | Status: DC

## 2011-08-02 NOTE — Assessment & Plan Note (Signed)
Stable symptoms on medical therapy.

## 2011-08-02 NOTE — Patient Instructions (Signed)
Your physician recommends that you schedule a follow-up appointment in: 2 MONTHS   Your physician has recommended you make the following change in your medication: START Pravastatin 20mg  take one by mouth daily--if you develop joint aches with this medication please stop pravastatin and contact the office  Your physician recommends that you return for a FASTING lipid and liver profile in 6 WEEKS--nothing to eat or drink after midnight, lab opens at 8:30

## 2011-08-02 NOTE — Progress Notes (Signed)
   HPI:  He feels much better.  All of the joint aches have resolved after holding Crestor.  Doing pretty well.  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  No chest pain.  Options discussed. Wife had issue on Crestor as well, it was switched, and she did better on another agent.  No current pain.    Current Outpatient Prescriptions  Medication Sig Dispense Refill  . amLODipine (NORVASC) 5 MG tablet Take 5 mg by mouth daily.        . clopidogrel (PLAVIX) 75 MG tablet TAKE ONE TABLET BY MOUTH DAILY WITH FOOD  30 tablet  6  . doxazosin (CARDURA) 2 MG tablet Take 1 tablet (2 mg total) by mouth at bedtime.  30 tablet  6  . metoprolol tartrate (LOPRESSOR) 25 MG tablet 1/2 tab po bid      . nitroGLYCERIN (NITROSTAT) 0.4 MG SL tablet Place 0.4 mg under the tongue every 5 (five) minutes as needed.          No Known Allergies  Past Medical History  Diagnosis Date  . History of nephrolithiasis     Dr. Vonita Moss  . 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  . [facility]   . HTN (hypertension)   . Hyperlipidemia     NMR Lipoprofile 2008: LDL 168 ( 2410/ 1826), HDL 41, TG 71. LDL goal = <100, ideally <  70. Framingham Study LDL goal = < 130.    Past Surgical History  Procedure Date  . Esophagela stricture     colonoscopy x2 EGD 2002  . Knee arthroscopy     Family History  Problem Relation Age of Onset  . Cancer Mother     lung cancer  . Diabetes Mother   . Hyperlipidemia Mother   . Stroke Father     age 46  . Cancer Sister     ?lymphoma    History   Social History  . Marital Status: Married    Spouse Name: N/A    Number of Children: N/A  . Years of Education: N/A   Occupational History  . Not on file.   Social History Main Topics  . Smoking status: Never Smoker   . Smokeless tobacco: Not on file  . Alcohol Use: No  . Drug Use: No  . Sexually Active: Not on file   Other Topics Concern  . Not on file   Social History Narrative  .  No narrative on file    ROS: Please see the HPI.  All other systems reviewed and negative.  PHYSICAL EXAM:  BP 128/79  Pulse 52  Ht 6\' 1"  (1.854 m)  Wt 95.763 kg (211 lb 1.9 oz)  BMI 27.85 kg/m2  General: Well developed, well nourished, in no acute distress. Head:  Normocephalic and atraumatic. Neck: no JVD Lungs: Clear to auscultation and percussion. Heart: Normal S1 and S2.  No murmur, rubs or gallops.  Pulses: Pulses normal in all 4 extremities. Extremities: No clubbing or cyanosis. No edema. Neurologic: Alert and oriented x 3.  EKG:  NSR.  INferolateral T flattening.   ASSESSMENT AND PLAN:

## 2011-08-02 NOTE — Assessment & Plan Note (Signed)
He is better off of Crestor.  Will try low dose pravachol, and get lipid and liver profile in six weeks.  He will see me back.

## 2011-08-14 ENCOUNTER — Telehealth: Payer: Self-pay | Admitting: Cardiology

## 2011-08-14 NOTE — Telephone Encounter (Signed)
Pt advised to hold pravastatin for now and will forward note to Dr. Riley Kill and Leotis Shames for recommendations.

## 2011-08-14 NOTE — Telephone Encounter (Signed)
At last OV pt was put on pradastaitatrn 20mg  qd because it is not agreeing with him it is causing soreness  In his chest area and changing in his breathing and even his muscles are sore

## 2011-08-16 NOTE — Telephone Encounter (Signed)
Per Dr Riley Kill he would like the pt to remain off of statin until his appointment in February.  At that appt Dr Riley Kill will make a decision about retrying statin drugs.  Pt aware of Dr Rosalyn Charters recommendation.

## 2011-08-16 NOTE — Telephone Encounter (Signed)
I spoke with the pt and he just stopped Pravastatin on Monday and has already noticed a significant improvement in his symptoms.  The pt did not tolerate Crestor and he cannot take Simvastatin due to Amlodipine.  The pt has not tried Atorvastatin.  I made the pt aware that I would speak with Dr Riley Kill about further medication recommendations.

## 2011-08-16 NOTE — Telephone Encounter (Signed)
F/u:  Pt called and stated that no one has gotten back with him regarding what to do about starting a new  Medicine.  His symptoms are much better since he has stopped th Pravastatin.  Please call and let him know what he should do and take.  Call his cell number at [phone].

## 2011-08-23 ENCOUNTER — Other Ambulatory Visit: Payer: Self-pay | Admitting: Internal Medicine

## 2011-09-13 ENCOUNTER — Other Ambulatory Visit: Payer: Medicare PPO | Admitting: *Deleted

## 2011-09-25 ENCOUNTER — Other Ambulatory Visit: Payer: Self-pay | Admitting: Cardiology

## 2011-10-04 ENCOUNTER — Ambulatory Visit: Payer: Medicare PPO | Admitting: Cardiology

## 2011-10-23 ENCOUNTER — Encounter: Payer: Self-pay | Admitting: Cardiology

## 2011-10-23 ENCOUNTER — Ambulatory Visit (INDEPENDENT_AMBULATORY_CARE_PROVIDER_SITE_OTHER): Payer: Medicare PPO | Admitting: Cardiology

## 2011-10-23 VITALS — BP 130/80 | HR 58 | Ht 73.0 in | Wt 210.8 lb

## 2011-10-23 DIAGNOSIS — E785 Hyperlipidemia, unspecified: Secondary | ICD-10-CM

## 2011-10-23 DIAGNOSIS — I2510 Atherosclerotic heart disease of native coronary artery without angina pectoris: Secondary | ICD-10-CM

## 2011-10-23 NOTE — Patient Instructions (Addendum)
Your physician recommends that you return for a FASTING LIPID and LIVER Profile--nothing to eat or drink after midnight, lab opens at 8:30--- 10/24/11  Your physician recommends that you continue on your current medications as directed. Please refer to the Current Medication list given to you today.  Your physician recommends that you schedule a follow-up appointment in: 4 MONTHS

## 2011-10-23 NOTE — Assessment & Plan Note (Signed)
No current recurrent chest pain.

## 2011-10-23 NOTE — Assessment & Plan Note (Signed)
Being followed at [facility].

## 2011-10-23 NOTE — Progress Notes (Signed)
   HPI:  He is doing well from a cardiac standpoint.  He has not been seen in [facility] in about 3-4 months but his check up at that time was good per patient.  He is staying active.  He walks every other day.  He does 30 minutes on the treadmill three times per week.  He denies chest pain.  He is tolerating his pravachol in low doses.    Current Outpatient Prescriptions  Medication Sig Dispense Refill  . amLODipine (NORVASC) 5 MG tablet TAKE 1 TABLET BY MOUTH DAILY  30 tablet  0  . doxazosin (CARDURA) 2 MG tablet TAKE 1 TABLET BY MOUTH EVERY NIGHT AT BEDTIME  30 tablet  5  . metoprolol tartrate (LOPRESSOR) 25 MG tablet 1/2 tab po bid      . nitroGLYCERIN (NITROSTAT) 0.4 MG SL tablet Place 0.4 mg under the tongue every 5 (five) minutes as needed.        . pravastatin (PRAVACHOL) 20 MG tablet Take 1 tablet (20 mg total) by mouth daily.  90 tablet  3    No Known Allergies  Past Medical History  Diagnosis Date  . History of nephrolithiasis     Dr. Vonita Moss  . 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  . [facility]   . HTN (hypertension)   . Hyperlipidemia     NMR Lipoprofile 2008: LDL 168 ( 2410/ 1826), HDL 41, TG 71. LDL goal = <100, ideally <  70. Framingham Study LDL goal = < 130.    Past Surgical History  Procedure Date  . Esophagela stricture     colonoscopy x2 EGD 2002  . Knee arthroscopy     Family History  Problem Relation Age of Onset  . Cancer Mother     lung cancer  . Diabetes Mother   . Hyperlipidemia Mother   . Stroke Father     age 104  . Cancer Sister     ?lymphoma    History   Social History  . Marital Status: Married    Spouse Name: N/A    Number of Children: N/A  . Years of Education: N/A   Occupational History  . Not on file.   Social History Main Topics  . Smoking status: Never Smoker   . Smokeless tobacco: Not on file  . Alcohol Use: No  . Drug Use: No  . Sexually Active: Not on file   Other Topics Concern  . Not on file   Social  History Narrative  . No narrative on file    ROS: Please see the HPI.  All other systems reviewed and negative.  PHYSICAL EXAM:  BP 130/80  Pulse 58  Ht 6\' 1"  (1.854 m)  Wt 210 lb 12.8 oz (95.618 kg)  BMI 27.81 kg/m2  General: Well developed, well nourished, in no acute distress. Head:  Normocephalic and atraumatic. Neck: no JVD Lungs: Clear to auscultation and percussion. Heart: Normal S1 and S2.  No murmur, rubs or gallops.  Abdomen:  Normal bowel sounds; soft; non tender; no organomegaly Pulses: Pulses normal in all 4 extremities. Extremities: No clubbing or cyanosis. No edema. Neurologic: Alert and oriented x 3.  EKG:  Not done today ASSESSMENT AND PLAN:

## 2011-10-23 NOTE — Assessment & Plan Note (Signed)
He is tolerating his pravachol.  He is not having joint aches, which is a step in the right direction.  We will modify our target to accommodate this.

## 2011-10-24 ENCOUNTER — Other Ambulatory Visit (INDEPENDENT_AMBULATORY_CARE_PROVIDER_SITE_OTHER): Payer: Medicare PPO

## 2011-10-24 DIAGNOSIS — I2510 Atherosclerotic heart disease of native coronary artery without angina pectoris: Secondary | ICD-10-CM

## 2011-10-24 DIAGNOSIS — E785 Hyperlipidemia, unspecified: Secondary | ICD-10-CM

## 2011-10-24 LAB — HEPATIC FUNCTION PANEL
ALT: 17 U/L (ref 0–53)
Albumin: 4 g/dL (ref 3.5–5.2)
Alkaline Phosphatase: 75 U/L (ref 39–117)
Total Protein: 6.9 g/dL (ref 6.0–8.3)

## 2011-10-24 LAB — LIPID PANEL
Cholesterol: 293 mg/dL — ABNORMAL HIGH (ref 0–200)
VLDL: 24 mg/dL (ref 0.0–40.0)

## 2011-10-26 ENCOUNTER — Telehealth: Payer: Self-pay

## 2011-10-26 NOTE — Telephone Encounter (Signed)
I spoke with the pt and made him aware of cholesterol results.  The pt said he has been off of Pravastatin due to body aches per December phone note. The pt has tried Crestor and Pravastatin and had side effects from both medications.  The pt said that his symptoms were not bad while on Pravastatin.  The pt will restart Pravastatin 20mg  daily and have a lipid and liver rechecked in 6 WEEKS.  The pt was instructed to call the office if he cannot tolerate Pravastatin. The pt will also focus on diet and exercise.    The pt also reviewed his medications with me over the phone and he is taking plavix.  Pt's medication list was updated.

## 2011-11-20 ENCOUNTER — Telehealth: Payer: Self-pay | Admitting: Cardiology

## 2011-11-20 NOTE — Telephone Encounter (Signed)
New Msg: Pt calling wanting to speak with nurse/Md regarding pt medications. Please return pt call to discuss further.

## 2011-11-20 NOTE — Telephone Encounter (Signed)
Pt is not tolerating pravastatin.  Has tried Crestor in the past with same results.  Wants alternative med.  Will wait to hear from Dr. Riley Kill.

## 2011-11-21 NOTE — Telephone Encounter (Signed)
F/U  Patient called for status update on this issuse.  Please return call to patient at [phone].

## 2011-11-21 NOTE — Telephone Encounter (Signed)
I called and left a message.  His symptoms are likely to class effect.  Doubt that they will go away.  Alternatives include Crestor once per week, or something such as Zetia.  TS

## 2011-11-25 ENCOUNTER — Other Ambulatory Visit: Payer: Self-pay | Admitting: Cardiology

## 2011-11-27 ENCOUNTER — Other Ambulatory Visit: Payer: Self-pay | Admitting: *Deleted

## 2011-11-27 ENCOUNTER — Other Ambulatory Visit: Payer: Self-pay | Admitting: Internal Medicine

## 2011-11-27 MED ORDER — CLOPIDOGREL BISULFATE 75 MG PO TABS: 75.0000 mg | ORAL_TABLET | Freq: Every day | ORAL | Status: DC

## 2011-11-27 NOTE — Telephone Encounter (Signed)
Fax Received. Refill Completed. Austin [name] (R.M.A)   

## 2011-11-27 NOTE — Telephone Encounter (Signed)
Fax Received. Refill Completed. Lillion Elbert Chowoe (R.M.A)   

## 2011-12-07 ENCOUNTER — Telehealth: Payer: Self-pay | Admitting: Cardiology

## 2011-12-07 NOTE — Telephone Encounter (Signed)
Pt calling re refill of nitro with refills for one year, [facility]

## 2011-12-08 ENCOUNTER — Emergency Department (HOSPITAL_COMMUNITY)
Admission: EM | Admit: 2011-12-08 | Discharge: 2011-12-08 | Disposition: A | Discharge status: Home / Self Care | Payer: Medicare PPO | Attending: Emergency Medicine | Admitting: Emergency Medicine

## 2011-12-08 ENCOUNTER — Ambulatory Visit (INDEPENDENT_AMBULATORY_CARE_PROVIDER_SITE_OTHER): Payer: Medicare PPO | Admitting: Internal Medicine

## 2011-12-08 ENCOUNTER — Encounter: Payer: Self-pay | Admitting: Internal Medicine

## 2011-12-08 ENCOUNTER — Telehealth: Payer: Self-pay | Admitting: *Deleted

## 2011-12-08 ENCOUNTER — Other Ambulatory Visit: Payer: Self-pay

## 2011-12-08 ENCOUNTER — Emergency Department (HOSPITAL_COMMUNITY): Payer: Medicare PPO

## 2011-12-08 ENCOUNTER — Encounter (HOSPITAL_COMMUNITY): Payer: Self-pay | Admitting: *Deleted

## 2011-12-08 VITALS — BP 147/72 | HR 48 | Temp 98.0°F | Resp 18 | Ht 71.0 in | Wt 209.0 lb

## 2011-12-08 DIAGNOSIS — I252 Old myocardial infarction: Secondary | ICD-10-CM | POA: Insufficient documentation

## 2011-12-08 DIAGNOSIS — R001 Bradycardia, unspecified: Secondary | ICD-10-CM

## 2011-12-08 DIAGNOSIS — I498 Other specified cardiac arrhythmias: Secondary | ICD-10-CM

## 2011-12-08 DIAGNOSIS — R011 Cardiac murmur, unspecified: Secondary | ICD-10-CM | POA: Insufficient documentation

## 2011-12-08 DIAGNOSIS — R079 Chest pain, unspecified: Secondary | ICD-10-CM | POA: Insufficient documentation

## 2011-12-08 DIAGNOSIS — I2510 Atherosclerotic heart disease of native coronary artery without angina pectoris: Secondary | ICD-10-CM | POA: Insufficient documentation

## 2011-12-08 DIAGNOSIS — I10 Essential (primary) hypertension: Secondary | ICD-10-CM | POA: Insufficient documentation

## 2011-12-08 HISTORY — DX: Hyperlipidemia, unspecified: E78.5

## 2011-12-08 HISTORY — DX: Acute myocardial infarction, unspecified: I21.9

## 2011-12-08 HISTORY — DX: Atherosclerotic heart disease of native coronary artery without angina pectoris: I25.10

## 2011-12-08 LAB — BASIC METABOLIC PANEL
BUN: 17 mg/dL (ref 6–23)
Calcium: 10.2 mg/dL (ref 8.4–10.5)
GFR calc non Af Amer: 71 mL/min — ABNORMAL LOW (ref >90)
Glucose, Bld: 91 mg/dL (ref 70–99)
Sodium: 138 mEq/L (ref 135–145)

## 2011-12-08 LAB — CBC
Hemoglobin: 13.4 g/dL (ref 13.0–17.0)
MCH: 29.6 pg (ref 26.0–34.0)
MCHC: 34.8 g/dL (ref 30.0–36.0)
RDW: 12.9 % (ref 11.5–15.5)

## 2011-12-08 MED ORDER — NITROGLYCERIN 0.4 MG SL SUBL: 0.4000 mg | SUBLINGUAL_TABLET | SUBLINGUAL | Status: DC | PRN

## 2011-12-08 NOTE — Telephone Encounter (Signed)
Continue note: An appointment was made for pt with Dr. Riley Kill for Tuesday April 17 th at 10:15 AM patient aware.

## 2011-12-08 NOTE — Progress Notes (Signed)
Dr Eden Emms spoke with Dr Weldon Inches (EDP) and discussed patient. They agreed pt could be discharged to home and follow up with Dr Riley Kill in the next couple of weeks.Patient was found to have bracycardia and the patients beta blocker was decreased to once a day by Dr Eden Emms.  Patient already has an appointment to see Dr Riley Kill on Wed April 17th @ 10:15 am. Pt was reminded of this upcoming appointment.

## 2011-12-08 NOTE — ED Notes (Signed)
Patient reports onset of chest pain yesterday.  Patient did take a nitro yesterday,  He did not take any today.  He has not had aspirin today

## 2011-12-08 NOTE — ED Notes (Signed)
Pt d/c'd from IV, monitor, continuous pulse oximetry and blood pressure cuff; pt getting dressed to be discharged home 

## 2011-12-08 NOTE — ED Notes (Signed)
EKG was performed in triage

## 2011-12-08 NOTE — Telephone Encounter (Signed)
Walk-in patient send from urgent care to be evaluated for chest pain. Patient states has chest pain on and off yesterday he had the chest pain twice with in 5 hours. Patient took nitroglycerin SL x 2 with relief. This AM patient had chest pain left side with pressure radiating to left arm. Again pt took two NTG SL with relief. Patient send to he [facility]. ER aware of patient's arrival. Rosann Auerbach TTttrentpaged to let her know, with no answer.

## 2011-12-08 NOTE — Progress Notes (Signed)
  Subjective:    Patient ID: Austin [name], male    DOB: 1946-02-10, 66 y.o.   MRN: [id number]  HPI  Austin [name] complains that he has had some chest pain intermittently for the last two days.  He does not have any chest pain at present.  He is a pt of Dr. Rosalyn Charters and has a stent and is treated for hyperlipidemia.  He has been evaluated at [facility] and has used his nitroglycerin twice within 5 hours yesterday.  [birth year] today.  He did not call his cardiology office to be seen.  While I was taking his history his wife called and was able to get him a walk-in appt with Dr. Rosalyn Charters office to be seen immediately.    Review of Systems  All other systems reviewed and are negative.    ROS negative except as noted in HPI     Objective:   Physical Exam  Vitals reviewed. Sinus bradycardia        Assessment & Plan:  CAD with hyperlipidemia s/p stent placement.  As pt is clinically stable his wife will drive him directly to the cardiologist office for evaluation now.

## 2011-12-08 NOTE — ED Provider Notes (Addendum)
History     CSN: [id number]  Arrival date & time 12/08/11  1002   First MD Initiated Contact with Patient 12/08/11 1018      Chief Complaint  Patient presents with  . Chest Pain    (Consider location/radiation/quality/duration/timing/severity/associated sxs/prior treatment) The history is provided by the patient and the spouse.   the patient is a 66 year old, male, with a history of coronary artery disease, and a stent placed last year, who presents to emergency department complaining of intermittent left-sided chest pain that feels like some he punched him in the chest.  For the past 2 days.  He denies any nausea, vomiting, sweating, or shortness of breath with the pain.  He states that it only lasts a few seconds.  He says he does aerobic exercise 2-3 times per week and does not get chest pain or unusual shortness of breath.  He did not take any nitroglycerin for them.  Symptoms.  He does not use aspirin when I walked into room  He, says he developed chest pain, when I was not in the room.  He did not have pain and by the end of my evaluation.  His pain had resolved.  Again.  He takes metoprolol 12 1/2 mg twice a day  Past Medical History  Diagnosis Date  . History of nephrolithiasis     Dr. Vonita Moss  . 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  . Allergy   . HTN (hypertension)   . Hyperlipidemia     NMR Lipoprofile 2008: LDL 168 ( 2410/ 1826), HDL 41, TG 71. LDL goal = <100, ideally <  70. Framingham Study LDL goal = < 130.  Marland Kitchen Coronary artery disease   . Myocardial infarction   . Hyperlipemia     Past Surgical History  Procedure Date  . Esophagela stricture     colonoscopy x2 EGD 2002  . Knee arthroscopy   . Cardiac surgery     Family History  Problem Relation Age of Onset  . Cancer Mother     lung cancer  . Diabetes Mother   . Hyperlipidemia Mother   . Stroke Father     age 74  . Cancer Sister     ?lymphoma    History  Substance Use Topics  . Smoking  status: Never Smoker   . Smokeless tobacco: Not on file  . Alcohol Use: No      Review of Systems  Constitutional: Negative for fever and chills.  Respiratory: Negative for cough, chest tightness and shortness of breath.   Cardiovascular: Positive for chest pain. Negative for palpitations and leg swelling.       Intermittent chest pain that lasts only seconds  Gastrointestinal: Negative for nausea and vomiting.  All other systems reviewed and are negative.    Allergies  Review of patient's allergies indicates no known allergies.  Home Medications   Current Outpatient Rx  Name Route Sig Dispense Refill  . AMLODIPINE BESYLATE 5 MG PO TABS Oral Take 5 mg by mouth daily.    Marland Kitchen CLOPIDOGREL BISULFATE 75 MG PO TABS Oral Take 1 tablet (75 mg total) by mouth daily. 30 tablet 5  . DORZOLAMIDE HCL-TIMOLOL MAL 22.3-6.8 MG/ML OP SOLN Both Eyes Place 1 drop into both eyes 2 (two) times daily.     Marland Kitchen DOXAZOSIN MESYLATE 2 MG PO TABS Oral Take 2 mg by mouth at bedtime.    Marland Kitchen METOPROLOL TARTRATE 25 MG PO TABS Oral Take 12.5 mg by  mouth 2 (two) times daily.     Marland Kitchen NITROGLYCERIN 0.4 MG SL SUBL Sublingual Place 0.4 mg under the tongue every 5 (five) minutes as needed. For chest pain      BP 140/73  Pulse 85  Temp(Src) 97.8 F (36.6 C) (Oral)  Resp 18  Ht 6\' 1"  (1.854 m)  Wt 209 lb (94.802 kg)  BMI 27.57 kg/m2  SpO2 99%  Physical Exam  Vitals reviewed. Constitutional: He is oriented to person, place, and time. He appears well-developed and well-nourished. No distress.  HENT:  Head: Normocephalic and atraumatic.  Eyes: Conjunctivae are normal.  Neck: Normal range of motion.  Cardiovascular: Normal rate.   Murmur heard. Pulmonary/Chest: Effort normal. He has no rales.  Abdominal: Soft. There is no tenderness.  Musculoskeletal: Normal range of motion.  Neurological: He is alert and oriented to person, place, and time.  Skin: Skin is warm and dry.  Psychiatric: He has a normal mood and  affect. Thought content normal.    ED Course  Procedures (including critical care time) 66 year old, male, with known coronary artery disease, including myocardial infarction, and stent placed one year ago, presents emergency department with intermittent chest pain, but no other symptoms consistent with ACS.  He exercise, and does not get chest pain.  I doubt that his symptoms are manifestation of coronary artery disease.  Today.  We will perform laboratory testing, and an EKG, for evaluation.  Labs Reviewed  CBC - Abnormal; Notable for the following:    WBC 3.2 (*)    HCT 38.5 (*)    Platelets 97 (*) PLATELET COUNT CONFIRMED BY SMEAR   All other components within normal limits  BASIC METABOLIC PANEL - Abnormal; Notable for the following:    GFR calc non Af Amer 71 (*)    GFR calc Af Amer 82 (*)    All other components within normal limits  POCT I-STAT TROPONIN I   Chest Portable 1 View  12/08/2011  *RADIOLOGY REPORT*  Clinical Data: Chest pain.  PORTABLE CHEST - 1 VIEW  Comparison: 12/04/2010  Findings: Mild elevation of the right hemidiaphragm.  Lungs are clear.  Heart is normal size.  No effusions or acute bony abnormality.  IMPRESSION: Elevated right hemidiaphragm.  No active disease.  Original Report Authenticated By: Cyndie Chime, M.D.     No diagnosis found.  I discussed the case with of our cardiology.  They agreed that the patient can be released.  We discussed his metoprolol dose and we will reduce it to 12 and half milligrams per day.  The patient will be seen next Wednesday at 10:15.   ED ECG REPORT   Date: 12/08/2011  EKG Time: 12:15 PM  Rate: 43  Rhythm: sinus bradycardia,   Axis: nl  Intervals:none  ST&T Change: none  Narrative Interpretation: sinusbrady           MDM  Chest pain No evidence of cardiac ischemia or arrhythmia        Cheri Guppy, MD 12/08/11 1152  Cheri Guppy, MD 12/08/11 1215

## 2011-12-08 NOTE — Discharge Instructions (Signed)
Your EKG, and blood tests do not show signs of a heart attack or damage to your heart.  Decrease your metoprolol to one half tablet once per day.  Followup with Dr. Riley Kill next Wednesday.  Return for worse or uncontrolled symptoms

## 2011-12-08 NOTE — ED Notes (Signed)
Pt undressed, in gown, on monitor, continuous pulse oximetry and blood pressure cuff; family at bedside 

## 2011-12-11 ENCOUNTER — Other Ambulatory Visit: Payer: Self-pay

## 2011-12-11 ENCOUNTER — Other Ambulatory Visit (HOSPITAL_COMMUNITY): Payer: Self-pay | Admitting: Internal Medicine

## 2011-12-11 ENCOUNTER — Ambulatory Visit (INDEPENDENT_AMBULATORY_CARE_PROVIDER_SITE_OTHER): Payer: Medicare PPO | Admitting: *Deleted

## 2011-12-11 DIAGNOSIS — E785 Hyperlipidemia, unspecified: Secondary | ICD-10-CM

## 2011-12-11 LAB — LIPID PANEL
Total CHOL/HDL Ratio: 6
VLDL: 29 mg/dL (ref 0.0–40.0)

## 2011-12-11 LAB — HEPATIC FUNCTION PANEL
Alkaline Phosphatase: 76 U/L (ref 39–117)
Bilirubin, Direct: 0 mg/dL (ref 0.0–0.3)

## 2011-12-11 MED ORDER — METOPROLOL TARTRATE 25 MG PO TABS: 12.5000 mg | ORAL_TABLET | Freq: Two times a day (BID) | ORAL | Status: DC

## 2011-12-12 NOTE — Telephone Encounter (Signed)
Pt scheduled to see Dr Riley Kill on 12/13/11.  Will discuss cholesterol medication at that time.

## 2011-12-13 ENCOUNTER — Encounter: Payer: Self-pay | Admitting: Cardiology

## 2011-12-13 ENCOUNTER — Ambulatory Visit (INDEPENDENT_AMBULATORY_CARE_PROVIDER_SITE_OTHER): Payer: Medicare PPO | Admitting: Cardiology

## 2011-12-13 VITALS — BP 128/82 | HR 46 | Ht 73.0 in | Wt 209.0 lb

## 2011-12-13 DIAGNOSIS — C8589 Other specified types of non-Hodgkin lymphoma, extranodal and solid organ sites: Secondary | ICD-10-CM

## 2011-12-13 DIAGNOSIS — I2510 Atherosclerotic heart disease of native coronary artery without angina pectoris: Secondary | ICD-10-CM

## 2011-12-13 DIAGNOSIS — E785 Hyperlipidemia, unspecified: Secondary | ICD-10-CM

## 2011-12-13 NOTE — Patient Instructions (Signed)
Your physician recommends that you schedule a follow-up appointment in: 6 WEEKS  Your physician recommends that you continue on your current medications as directed. Please refer to the Current Medication list given to you today.  

## 2011-12-13 NOTE — Progress Notes (Signed)
HPI:  Patient is in for follow up.  He was in the ER.  Told he did not have an MI.  Was tender over left chest to back, worse with turning.  Not like MI pain.  Headed to [facility] to get follow up on his tumor.  No currently chest pain.    Current Outpatient Prescriptions  Medication Sig Dispense Refill  . amLODipine (NORVASC) 5 MG tablet Take 5 mg by mouth daily.      . clopidogrel (PLAVIX) 75 MG tablet Take 1 tablet (75 mg total) by mouth daily.  30 tablet  5  . dorzolamide-timolol (COSOPT) 22.3-6.8 MG/ML ophthalmic solution Place 1 drop into both eyes 2 (two) times daily.       Marland Kitchen doxazosin (CARDURA) 2 MG tablet Take 2 mg by mouth at bedtime.      . metoprolol tartrate (LOPRESSOR) 25 MG tablet Take 0.5 tablets (12.5 mg total) by mouth 2 (two) times daily.  30 tablet  2  . nitroGLYCERIN (NITROSTAT) 0.4 MG SL tablet Place 1 tablet (0.4 mg total) under the tongue every 5 (five) minutes as needed. For chest pain  25 tablet  12    No Known Allergies  Past Medical History  Diagnosis Date  . History of nephrolithiasis     Dr. Vonita Moss  . 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  . [facility]   . HTN (hypertension)   . Hyperlipidemia     NMR Lipoprofile 2008: LDL 168 ( 2410/ 1826), HDL 41, TG 71. LDL goal = <100, ideally <  70. Framingham Study LDL goal = < 130.  Marland Kitchen Coronary artery disease   . Myocardial infarction   . Hyperlipemia     Past Surgical History  Procedure Date  . Esophagela stricture     colonoscopy x2 EGD 2002  . Knee arthroscopy   . Cardiac surgery     Family History  Problem Relation Age of Onset  . Cancer Mother     lung cancer  . Diabetes Mother   . Hyperlipidemia Mother   . Stroke Father     age 54  . Cancer Sister     ?lymphoma    History   Social History  . Marital Status: Married    Spouse Name: N/A    Number of Children: N/A  . Years of Education: N/A   Occupational History  . Not on file.   Social History Main Topics  . Smoking status: Never  Smoker   . Smokeless tobacco: Not on file  . Alcohol Use: No  . Drug Use: No  . Sexually Active: Not on file   Other Topics Concern  . Not on file   Social History Narrative  . No narrative on file    ROS: Please see the HPI.  All other systems reviewed and negative.  PHYSICAL EXAM:  BP 128/82  Pulse 46  Ht 6\' 1"  (1.854 m)  Wt 209 lb (94.802 kg)  BMI 27.57 kg/m2  General: Well developed, well nourished, in no acute distress. Head:  Normocephalic and atraumatic. Neck: no JVD Chest:  Tender over the left chest.  This reproduces his same symptoms in the left 3rd intercostal junction.   Lungs: Clear to auscultation and percussion. Heart: Normal S1 and S2.  No murmur, rubs or gallops.  Abdomen:  Normal bowel sounds; soft; non tender; no organomegaly Pulses: Pulses normal in all 4 extremities. Extremities: No clubbing or cyanosis. No edema. Neurologic: Alert and oriented  x 3.  EKG:  Marked sinus bradycardia.  Probable lateral MI, old.  T inversion in lateral leads.  No change from ER tracing.   ASSESSMENT AND PLAN:

## 2011-12-18 NOTE — Assessment & Plan Note (Signed)
This is difficult.  It is an issue with elevated LDL, and yet he has tried many, all with the same results.  For now, we are deferring treatment primarily because of statin intolerance.  We could try other agents, but they are of unproven benefit.

## 2011-12-18 NOTE — Assessment & Plan Note (Signed)
The patient has very extensive CAD that is somewhat problematic.  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  The current symptoms that sent him to the ER clearly are not that.  He, his wife, and I clearly documented that.  Pressing on the left costochondral junction reproduced his symptoms exactly.  I even described it for him, and gave his wife a web site that she reviewed as we spoke.  Should he have any symptoms that are not MS  (which seems likely over time), he is to contact us immediately.  We will continue to follow him closely.

## 2011-12-18 NOTE — Assessment & Plan Note (Signed)
Continued follow up at [facility].

## 2012-01-24 ENCOUNTER — Ambulatory Visit (INDEPENDENT_AMBULATORY_CARE_PROVIDER_SITE_OTHER): Payer: Medicare PPO | Admitting: Cardiology

## 2012-01-24 VITALS — BP 120/84 | HR 56 | Ht 72.0 in | Wt 210.0 lb

## 2012-01-24 DIAGNOSIS — I2510 Atherosclerotic heart disease of native coronary artery without angina pectoris: Secondary | ICD-10-CM

## 2012-01-24 DIAGNOSIS — C8589 Other specified types of non-Hodgkin lymphoma, extranodal and solid organ sites: Secondary | ICD-10-CM

## 2012-01-24 DIAGNOSIS — E785 Hyperlipidemia, unspecified: Secondary | ICD-10-CM

## 2012-01-24 NOTE — Progress Notes (Signed)
   HPI:  Patient returns in followup. He's been seen in the cancer clinic at [facility]. He had a bone marrow performed. He apparently is about to undergo either additional chemotherapy or radiation. As noted, the patient has been unable to take statin drugs. He's not had much chest pain. He was dancing to the electric slide, and noticed a little something, and sat down but it went away without any therapy whatsoever. He remained stable.  Current Outpatient Prescriptions  Medication Sig Dispense Refill  . amLODipine (NORVASC) 5 MG tablet Take 5 mg by mouth daily.      . clopidogrel (PLAVIX) 75 MG tablet Take 1 tablet (75 mg total) by mouth daily.  30 tablet  5  . dorzolamide-timolol (COSOPT) 22.3-6.8 MG/ML ophthalmic solution Place 1 drop into both eyes 2 (two) times daily.       Marland Kitchen doxazosin (CARDURA) 2 MG tablet Take 2 mg by mouth at bedtime.      . metoprolol tartrate (LOPRESSOR) 25 MG tablet Take 0.5 tablets (12.5 mg total) by mouth 2 (two) times daily.  30 tablet  2  . nitroGLYCERIN (NITROSTAT) 0.4 MG SL tablet Place 1 tablet (0.4 mg total) under the tongue every 5 (five) minutes as needed. For chest pain  25 tablet  12    No Known Allergies  Past Medical History  Diagnosis Date  . History of nephrolithiasis     Dr. Vonita Moss  . 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  . [facility]   . HTN (hypertension)   . Hyperlipidemia     NMR Lipoprofile 2008: LDL 168 ( 2410/ 1826), HDL 41, TG 71. LDL goal = <100, ideally <  70. Framingham Study LDL goal = < 130.  Marland Kitchen Coronary artery disease   . Myocardial infarction   . Hyperlipemia     Past Surgical History  Procedure Date  . Esophagela stricture     colonoscopy x2 EGD 2002  . Knee arthroscopy   . Cardiac surgery     Family History  Problem Relation Age of Onset  . Cancer Mother     lung cancer  . Diabetes Mother   . Hyperlipidemia Mother   . Stroke Father     age 7  . Cancer Sister     ?lymphoma    History   Social History  .  Marital Status: Married    Spouse Name: N/A    Number of Children: N/A  . Years of Education: N/A   Occupational History  . Not on file.   Social History Main Topics  . Smoking status: Never Smoker   . Smokeless tobacco: Not on file  . Alcohol Use: No  . Drug Use: No  . Sexually Active: Not on file   Other Topics Concern  . Not on file   Social History Narrative  . No narrative on file    ROS: Please see the HPI.  All other systems reviewed and negative.  PHYSICAL EXAM:  BP 120/84  Pulse 56  Ht 6' (1.829 m)  Wt 210 lb (95.255 kg)  BMI 28.48 kg/m2  General: Well developed, well nourished, in no acute distress. Head:  Normocephalic and atraumatic. Neck: no JVD Lungs: Clear to auscultation and percussion. Heart: Normal S1 and S2.  No murmur, rubs or gallops.  Extremities: No clubbing or cyanosis. No edema. Neurologic: Alert and oriented x 3.  EKG:  ASSESSMENT AND PLAN:

## 2012-01-24 NOTE — Patient Instructions (Signed)
Your physician recommends that you schedule a follow-up appointment in: 2 MONTHS with Dr Stuckey  Your physician recommends that you continue on your current medications as directed. Please refer to the Current Medication list given to you today.  

## 2012-01-24 NOTE — Assessment & Plan Note (Signed)
It sounds as though he will require more therapy. He is scheduled to see an oncologists at [facility] next week. Further decisions will be made by them. The patient is reviewing the medication options at this point in time.

## 2012-01-24 NOTE — Assessment & Plan Note (Signed)
He currently has controlled angina. Continued medical therapy would be warranted at this time.

## 2012-01-24 NOTE — Assessment & Plan Note (Signed)
We will consider new options as they become available. Clearly, the patient does not tolerate statins. We have tried multiple different options, and each time they result in symptoms. He understands the current issues.

## 2012-02-14 ENCOUNTER — Ambulatory Visit: Payer: Medicare PPO | Admitting: Cardiology

## 2012-03-25 ENCOUNTER — Ambulatory Visit (INDEPENDENT_AMBULATORY_CARE_PROVIDER_SITE_OTHER): Payer: Medicare PPO | Admitting: Cardiology

## 2012-03-25 ENCOUNTER — Encounter: Payer: Self-pay | Admitting: Cardiology

## 2012-03-25 VITALS — BP 131/82 | HR 64 | Ht 73.0 in | Wt 206.8 lb

## 2012-03-25 DIAGNOSIS — I2510 Atherosclerotic heart disease of native coronary artery without angina pectoris: Secondary | ICD-10-CM

## 2012-03-25 DIAGNOSIS — E785 Hyperlipidemia, unspecified: Secondary | ICD-10-CM

## 2012-03-25 NOTE — Patient Instructions (Addendum)
Your physician recommends that you schedule a follow-up appointment in: 3 MONTHS  Your physician recommends that you continue on your current medications as directed. Please refer to the Current Medication list given to you today.  Requested information for patient: Dr Raymond Gurney Magrinat [phone]

## 2012-03-25 NOTE — Assessment & Plan Note (Signed)
No new symptoms.  He is doing pretty well.  They want him to go into a new clinical trial for his cancer.  It would involve multiple combined drugs, and he could get sick with it.  We discussed in detail.

## 2012-03-25 NOTE — Progress Notes (Signed)
HPI:  The patient is stable at the present time. He's not having any major cardiac symptoms. He's recently been in [facility] and get recurrent radiation therapy for his cancer. In addition, the proposed multidrug aggressive strategy as part of a clinical trial. He and I discussed this at some length today. He denies any new cardiac symptoms. He's able to do most of his exercises without much difficulty.  Current Outpatient Prescriptions  Medication Sig Dispense Refill  . amLODipine (NORVASC) 5 MG tablet Take 5 mg by mouth daily.      . clopidogrel (PLAVIX) 75 MG tablet Take 1 tablet (75 mg total) by mouth daily.  30 tablet  5  . dorzolamide-timolol (COSOPT) 22.3-6.8 MG/ML ophthalmic solution Place 1 drop into both eyes 2 (two) times daily.       Marland Kitchen doxazosin (CARDURA) 2 MG tablet Take 2 mg by mouth at bedtime.      . metoprolol tartrate (LOPRESSOR) 25 MG tablet Take 0.5 tablets (12.5 mg total) by mouth 2 (two) times daily.  30 tablet  2  . nitroGLYCERIN (NITROSTAT) 0.4 MG SL tablet Place 1 tablet (0.4 mg total) under the tongue every 5 (five) minutes as needed. For chest pain  25 tablet  12    No Known Allergies  Past Medical History  Diagnosis Date  . History of nephrolithiasis     Dr. Vonita Moss  . 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  [facility]   . HTN (hypertension)   . Hyperlipidemia     NMR Lipoprofile 2008: LDL 168 ( 2410/ 1826), HDL 41, TG 71. LDL goal = <100, ideally <  70. Framingham Study LDL goal = < 130.  Marland Kitchen Coronary artery disease   . Myocardial infarction   . Hyperlipemia     Past Surgical History  Procedure Date  . Esophagela stricture     colonoscopy x2 EGD 2002  . Knee arthroscopy   . Cardiac surgery     Family History  Problem Relation Age of Onset  . Cancer Mother     lung cancer  . Diabetes Mother   . Hyperlipidemia Mother   . Stroke Father     age 43  . Cancer Sister     ?lymphoma    History   Social History  . Marital Status: Married     Spouse Name: N/A    Number of Children: N/A  . Years of Education: N/A   Occupational History  . Not on file.   Social History Main Topics  . Smoking status: Never Smoker   . Smokeless tobacco: Never Used  . Alcohol Use: No  . Drug Use: No  . Sexually Active: Not on file   Other Topics Concern  . Not on file   Social History Narrative  . No narrative on file    ROS: Please see the HPI.  All other systems reviewed and negative.  PHYSICAL EXAM:  BP 131/82  Pulse 64  Ht 6\' 1"  (1.854 m)  Wt 206 lb 12.8 oz (93.804 kg)  BMI 27.28 kg/m2  General: Well developed, well nourished, in no acute distress. Head:  Normocephalic and atraumatic. Neck: no JVD Lungs: Clear to auscultation and percussion. Heart: Normal S1 and S2.  No murmur, rubs or gallops.  Pulses: Pulses normal in all 4 extremities. Extremities: No clubbing or cyanosis. No edema. Neurologic: Alert and oriented x 3.  EKG:  NSR.  Nonspecific T wave changes.  No change from last  tracing   (Except voltage) ASSESSMENT AND PLAN:

## 2012-03-25 NOTE — Assessment & Plan Note (Signed)
Does not tolerate statins, and needs to keep drugs at a minimum because of his cancer treatment.

## 2012-04-19 ENCOUNTER — Other Ambulatory Visit: Payer: Self-pay | Admitting: Cardiology

## 2012-05-09 ENCOUNTER — Telehealth: Payer: Self-pay | Admitting: Internal Medicine

## 2012-05-09 NOTE — Telephone Encounter (Signed)
Caller: Edrik/Patient; Patient Name: Austin [name]; PCP: Marga Melnick; Best Callback Phone Number: [phone] Onset- 05/07/12  Afebrile. Pt is having abdominal pain and soreness that he rates at a 5 on pain scale. The discomfort comes and goes. He also has  off/on  pain in the private area which he is not having right  now. Emergent s/s of Abdominal pain protocol r/o. Pt to see provider within 24hrs. Per notes can only schedule current day appointments. [place] in office scheduled pt for a 4:30pm appointmnet tomorrow with Dr. Alwyn Ren, pt to schedule his physical while in office because he is now overdue. Pt agrees.

## 2012-05-09 NOTE — Telephone Encounter (Signed)
Noted, per Dr.Hopper's protocol, if patient already schedule for an appointment or sent to ER or UC ok to close encounter

## 2012-05-10 ENCOUNTER — Encounter: Payer: Self-pay | Admitting: Internal Medicine

## 2012-05-10 ENCOUNTER — Ambulatory Visit (INDEPENDENT_AMBULATORY_CARE_PROVIDER_SITE_OTHER): Payer: Medicare PPO | Admitting: Internal Medicine

## 2012-05-10 VITALS — BP 140/80 | HR 61 | Temp 98.6°F | Wt 206.8 lb

## 2012-05-10 DIAGNOSIS — R109 Unspecified abdominal pain: Secondary | ICD-10-CM

## 2012-05-10 DIAGNOSIS — R319 Hematuria, unspecified: Secondary | ICD-10-CM

## 2012-05-10 LAB — POCT URINALYSIS DIPSTICK
Bilirubin, UA: NEGATIVE
Glucose, UA: NEGATIVE
Ketones, UA: NEGATIVE
Spec Grav, UA: 1.02

## 2012-05-10 MED ORDER — HYDROCODONE-ACETAMINOPHEN 7.5-500 MG PO TABS: 1.0000 | ORAL_TABLET | ORAL | Status: AC | PRN

## 2012-05-10 NOTE — Patient Instructions (Addendum)
Stay well hydrated. Drink to thirst up to 40 ounces of fluids daily. Strain urine.  If you activate My Chart; the results can be released to you as soon as they populate from the lab. If you choose not to use this program; the labs have to be reviewed, copied & mailed   causing a delay in getting the results to you.

## 2012-05-10 NOTE — Progress Notes (Signed)
  Subjective:    Patient ID: Austin [name], male    DOB: [birth year], 66 y.o.   MRN: [id number]  HPI His symptoms began 05/07/12 as bilateral lower quadrant discomfort with radiation into the scrotum. This was described as burning and associated with tenderness of the scrotum. The symptoms lasted for hours before resolving. Had been intermittent since but" decreased by 2/3" last night following a hot tub bath.  He had associated upper flank back pain described as "throbbing pain & soreness" which responded partially to Bayer aspirin  He denies hematuria, pyuria, or dysuria. He does have a past history of renal calculi; those symptoms were similar  He is scheduled to be seen at [facility] 9/18 for repeat CAT scan as a prelude to possibly restarting chemotherapy for his lymphoma. 4 months ago he received radiation for some cervical lymphadenopathy    Review of Systems Nausea/Vomiting:no Diarrhea: no Constipation: no  Melena/BRBPR: no Hematemesis: no Anorexia: no Fever/Chills:no Rash: no  Wt loss: no         Objective:   Physical Exam General appearance is one of good health and nourishment w/o distress.  Eyes: No conjunctival inflammation or scleral icterus is present. Arcus   Oral exam: Dental hygiene is good; lips and gums are healthy appearing.There is no oropharyngeal erythema or exudate noted.   Heart:  Slow rate and regular rhythm. S1 and S2 normal without gallop,  click, rub or other extra sounds .Grade 1/6 systolic murmur     Lungs:Chest clear to auscultation; no wheezes, rhonchi,rales ,or rubs present.No increased work of breathing.   Abdomen: bowel sounds normal, soft and non-tender without masses, organomegaly or hernias noted.  No guarding or rebound . No discomfort to percussion over the flanks  Skin:Warm & dry.  Intact without suspicious lesions or rashes ; no jaundice or tenting  Lymphatic: No lymphadenopathy is noted about the head, neck, axilla, or  inguinal areas.   Genitalia normal except for left varices              Assessment & Plan:  #1 bilateral flank and lower quadrant discomfort with scrotal radiation. Symptoms mimic those of prior nephrolithiasis. Urinalysis revealed moderate blood and with negative nitrites and leukocytes.  Plan: He'll be asked to force hydration and strain his urine. Pain medication will be provided. If symptoms recur and progress; CT urogram would be indicated. Urine will be cultured

## 2012-05-15 LAB — URINE CULTURE
Colony Count: NO GROWTH
Organism ID, Bacteria: NO GROWTH

## 2012-05-27 ENCOUNTER — Other Ambulatory Visit: Payer: Self-pay | Admitting: Internal Medicine

## 2012-06-24 ENCOUNTER — Other Ambulatory Visit: Payer: Self-pay | Admitting: Cardiology

## 2012-06-24 MED ORDER — CLOPIDOGREL BISULFATE 75 MG PO TABS: 75.0000 mg | ORAL_TABLET | Freq: Every day | ORAL | Status: DC

## 2012-07-01 ENCOUNTER — Ambulatory Visit (INDEPENDENT_AMBULATORY_CARE_PROVIDER_SITE_OTHER): Payer: Medicare PPO | Admitting: Cardiology

## 2012-07-01 ENCOUNTER — Encounter: Payer: Self-pay | Admitting: Cardiology

## 2012-07-01 VITALS — BP 151/78 | HR 58 | Ht 73.0 in | Wt 209.0 lb

## 2012-07-01 DIAGNOSIS — I10 Essential (primary) hypertension: Secondary | ICD-10-CM

## 2012-07-01 DIAGNOSIS — C8589 Other specified types of non-Hodgkin lymphoma, extranodal and solid organ sites: Secondary | ICD-10-CM

## 2012-07-01 DIAGNOSIS — I2510 Atherosclerotic heart disease of native coronary artery without angina pectoris: Secondary | ICD-10-CM

## 2012-07-01 DIAGNOSIS — E785 Hyperlipidemia, unspecified: Secondary | ICD-10-CM

## 2012-07-01 NOTE — Progress Notes (Signed)
HPI:  The patient continues to do well from a cardiac standpoint. He's exercising at least twice weekly, and sometimes 3 times. He's about to undergo under oncology treatment in [facility]. This will involve some radioactive tracer.  He denies any chest pain. Bp usually 120 at home.   Current Outpatient Prescriptions  Medication Sig Dispense Refill  . amLODipine (NORVASC) 5 MG tablet Take 5 mg by mouth daily.      Marland Kitchen aspirin 81 MG tablet Take 1 tablet (81 mg total) by mouth daily.  1 tablet    . clopidogrel (PLAVIX) 75 MG tablet Take 1 tablet (75 mg total) by mouth daily.  30 tablet  5  . dorzolamide-timolol (COSOPT) 22.3-6.8 MG/ML ophthalmic solution Place 1 drop into both eyes 2 (two) times daily.       Marland Kitchen doxazosin (CARDURA) 2 MG tablet TAKE 1 TABLET BY MOUTH EVERY NIGHT AT BEDTIME  30 tablet  9  . metoprolol tartrate (LOPRESSOR) 25 MG tablet TAKE 0.5 TABLET BY MOUTH TWICE DAILY  90 tablet  1  . nitroGLYCERIN (NITROSTAT) 0.4 MG SL tablet Place 1 tablet (0.4 mg total) under the tongue every 5 (five) minutes as needed. For chest pain  25 tablet  12    No Known Allergies  Past Medical History  Diagnosis Date  . History of nephrolithiasis     Dr. Vonita Moss  . 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  . [facility]ergy   . HTN (hypertension)   . Hyperlipidemia     NMR Lipoprofile 2008: LDL 168 ( 2410/ 1826), HDL 41, TG 71. LDL goal = <100, ideally <  70. Framingham Study LDL goal = < 130.  Marland Kitchen Coronary artery disease   . Myocardial infarction   . Hyperlipemia     Past Surgical History  Procedure Date  . Esophagela stricture     colonoscopy x2 EGD 2002  . Knee arthroscopy   . Cardiac surgery     Family History  Problem Relation Age of Onset  . Cancer Mother     lung cancer  . Diabetes Mother   . Hyperlipidemia Mother   . Stroke Father     age 64  . Cancer Sister     ?lymphoma    History   Social History  . Marital Status: Married    Spouse Name: N/A    Number of  Children: N/A  . Years of Education: N/A   Occupational History  . Not on file.   Social History Main Topics  . Smoking status: Never Smoker   . Smokeless tobacco: Never Used  . Alcohol Use: No  . Drug Use: No  . Sexually Active: Not on file   Other Topics Concern  . Not on file   Social History Narrative  . No narrative on file    ROS: Please see the HPI.  All other systems reviewed and negative.  PHYSICAL EXAM:  BP 151/78  Pulse 58  Ht 6\' 1"  (1.854 m)  Wt 209 lb (94.802 kg)  BMI 27.57 kg/m2  General: Well developed, well nourished, in no acute distress. Head:  Normocephalic and atraumatic. Neck: no JVD Lungs: Clear to auscultation and percussion. Heart: Regular, minimal SEM.  No DM.  Pos S4.   Abdomen:  Normal bowel sounds; soft; non tender; no organomegaly Pulses: Pulses normal in all 4 extremities. Extremities: No clubbing or cyanosis. No edema. Neurologic: Alert and oriented x 3.  EKG:  SB.  Inferior posterior MI,  old.    ASSESSMENT AND PLAN:

## 2012-07-01 NOTE — Patient Instructions (Addendum)
Your physician recommends that you schedule a follow-up appointment in: MARCH 2014  Your physician recommends that you continue on your current medications as directed. Please refer to the Current Medication list given to you today.  

## 2012-07-01 NOTE — Assessment & Plan Note (Signed)
No recurrent chest pain. Continue medical therapy. 

## 2012-07-01 NOTE — Assessment & Plan Note (Signed)
Stable.  Getting ready for a new treatment, Bexxar.  Await information.

## 2012-07-01 NOTE — Assessment & Plan Note (Signed)
Clearly statin intolerant.

## 2012-07-01 NOTE — Assessment & Plan Note (Signed)
Blood pressures are better at home.

## 2012-08-18 ENCOUNTER — Encounter: Payer: Self-pay | Admitting: Internal Medicine

## 2012-08-20 ENCOUNTER — Telehealth: Payer: Self-pay | Admitting: Internal Medicine

## 2012-08-20 NOTE — Telephone Encounter (Signed)
Message copied by Verner Chol on Tue Aug 20, 2012 12:19 PM ------      Message from: Pecola Lawless      Created: Sun Aug 18, 2012 12:36 PM       Please  Schedule non  fasting Labs :  CBC & dif once weekly X 4 beginning week of September 22, 2012. Codes : 202.80,C82.00. FAX results to Dr Benita Gutter , [facility] [phone]. Thanks

## 2012-08-20 NOTE — Telephone Encounter (Signed)
advise lottie -- as below she stated she would have pt call me back to set up

## 2012-08-30 ENCOUNTER — Other Ambulatory Visit (INDEPENDENT_AMBULATORY_CARE_PROVIDER_SITE_OTHER): Payer: Medicare PPO

## 2012-08-30 DIAGNOSIS — C8589 Other specified types of non-Hodgkin lymphoma, extranodal and solid organ sites: Secondary | ICD-10-CM

## 2012-08-30 LAB — CBC WITH DIFFERENTIAL/PLATELET
Basophils Absolute: 0 10*3/uL (ref 0.0–0.1)
Basophils Relative: 0.7 % (ref 0.0–3.0)
Hemoglobin: 12.4 g/dL — ABNORMAL LOW (ref 13.0–17.0)
Lymphocytes Relative: 16.2 % (ref 12.0–46.0)
Monocytes Relative: 13 % — ABNORMAL HIGH (ref 3.0–12.0)
Neutro Abs: 2.2 10*3/uL (ref 1.4–7.7)
RBC: 4.15 Mil/uL — ABNORMAL LOW (ref 4.22–5.81)
RDW: 13.3 % (ref 11.5–14.6)

## 2012-08-31 ENCOUNTER — Encounter: Payer: Self-pay | Admitting: Internal Medicine

## 2012-09-06 ENCOUNTER — Other Ambulatory Visit: Payer: Medicare PPO

## 2012-09-09 ENCOUNTER — Other Ambulatory Visit (INDEPENDENT_AMBULATORY_CARE_PROVIDER_SITE_OTHER): Payer: Medicare PPO

## 2012-09-09 DIAGNOSIS — C8582 Other specified types of non-Hodgkin lymphoma, intrathoracic lymph nodes: Secondary | ICD-10-CM

## 2012-09-10 LAB — CBC WITH DIFFERENTIAL/PLATELET
Basophils Absolute: 0 10*3/uL (ref 0.0–0.1)
Basophils Relative: 0.9 % (ref 0.0–3.0)
HCT: 37.2 % — ABNORMAL LOW (ref 39.0–52.0)
Hemoglobin: 12.5 g/dL — ABNORMAL LOW (ref 13.0–17.0)
Lymphs Abs: 0.5 10*3/uL — ABNORMAL LOW (ref 0.7–4.0)
Monocytes Relative: 15.4 % — ABNORMAL HIGH (ref 3.0–12.0)
Neutro Abs: 1.2 10*3/uL — ABNORMAL LOW (ref 1.4–7.7)
RDW: 13.4 % (ref 11.5–14.6)

## 2012-09-13 ENCOUNTER — Other Ambulatory Visit: Payer: Medicare PPO

## 2012-09-13 DIAGNOSIS — C8589 Other specified types of non-Hodgkin lymphoma, extranodal and solid organ sites: Secondary | ICD-10-CM

## 2012-09-20 ENCOUNTER — Other Ambulatory Visit: Payer: Medicare PPO

## 2012-10-28 ENCOUNTER — Ambulatory Visit (INDEPENDENT_AMBULATORY_CARE_PROVIDER_SITE_OTHER): Payer: Medicare PPO | Admitting: Cardiology

## 2012-10-28 ENCOUNTER — Encounter: Payer: Self-pay | Admitting: Cardiology

## 2012-10-28 VITALS — BP 130/80 | HR 52 | Ht 73.0 in | Wt 211.0 lb

## 2012-10-28 DIAGNOSIS — C8589 Other specified types of non-Hodgkin lymphoma, extranodal and solid organ sites: Secondary | ICD-10-CM

## 2012-10-28 DIAGNOSIS — E785 Hyperlipidemia, unspecified: Secondary | ICD-10-CM

## 2012-10-28 DIAGNOSIS — I2510 Atherosclerotic heart disease of native coronary artery without angina pectoris: Secondary | ICD-10-CM

## 2012-10-28 DIAGNOSIS — I10 Essential (primary) hypertension: Secondary | ICD-10-CM

## 2012-10-28 NOTE — Patient Instructions (Addendum)
Your physician wants you to follow-up in: 6 MONTHS with Dr Cooper (previous pt of Dr Stuckey). You will receive a reminder letter in the mail two months in advance. If you don't receive a letter, please call our office to schedule the follow-up appointment.  Your physician recommends that you continue on your current medications as directed. Please refer to the Current Medication list given to you today.  

## 2012-10-28 NOTE — Assessment & Plan Note (Signed)
Controlled on medical therapy. 

## 2012-10-28 NOTE — Assessment & Plan Note (Signed)
Undergoing treatment at [facility].

## 2012-10-28 NOTE — Progress Notes (Signed)
HPI:  This very nice gentleman is in for followup. He's not had much chest pain, although he has not been exercising too much. 2 months ago he underwent a new therapy at [facility], and this involved a radioactive substance. He really feels the best he has felt some time. He is hopeful that this is a very effective therapy for him, and he is been communicating with the patient from [place] on the phone who has had extended survival after use of this.  (radioactive Bexxar?).    Current Outpatient Prescriptions  Medication Sig Dispense Refill  . amLODipine (NORVASC) 5 MG tablet Take 5 mg by mouth daily.      Marland Kitchen aspirin 81 MG tablet Take 81 mg by mouth daily.      . clopidogrel (PLAVIX) 75 MG tablet Take 75 mg by mouth daily.      . DORZOLAMIDE HCL OP Take 10 mg by mouth 2 times daily at 12 noon and 4 pm.      . metoprolol tartrate (LOPRESSOR) 25 MG tablet Take 25 mg by mouth daily.      . timolol (BETIMOL) 0.25 % ophthalmic solution Place into both eyes 2 (two) times daily.       No current facility-administered medications for this visit.    No Known Allergies  Past Medical History  Diagnosis Date  . History of nephrolithiasis     Dr. Vonita Moss  . 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  . [facility]   . HTN (hypertension)   . Hyperlipidemia     NMR Lipoprofile 2008: LDL 168 ( 2410/ 1826), HDL 41, TG 71. LDL goal = <100, ideally <  70. Framingham Study LDL goal = < 130.  Marland Kitchen Coronary artery disease   . Myocardial infarction   . Hyperlipemia     Past Surgical History  Procedure Laterality Date  . Esophagela stricture      colonoscopy x2 EGD 2002  . Knee arthroscopy    . Cardiac surgery      Family History  Problem Relation Age of Onset  . Cancer Mother     lung cancer  . Diabetes Mother   . Hyperlipidemia Mother   . Stroke Father     age 84  . Cancer Sister     ?lymphoma    History   Social History  . Marital Status: Married    Spouse Name: N/A    Number of  Children: N/A  . Years of Education: N/A   Occupational History  . Not on file.   Social History Main Topics  . Smoking status: Never Smoker   . Smokeless tobacco: Never Used  . Alcohol Use: No  . Drug Use: No  . Sexually Active: Not on file   Other Topics Concern  . Not on file   Social History Narrative  . No narrative on file    ROS: Please see the HPI.  All other systems reviewed and negative.  PHYSICAL EXAM:  BP 130/80  Pulse 52  Ht 6\' 1"  (1.854 m)  Wt 211 lb (95.709 kg)  BMI 27.84 kg/m2  SpO2 98%  General: Well developed, well nourished, in no acute distress. Head:  Normocephalic and atraumatic. Neck: no JVD Lungs: Clear to auscultation and percussion. Heart: Normal S1 and S2.  No murmur, rubs or gallops.  Pulses: Pulses normal in all 4 extremities. Extremities: No clubbing or cyanosis. No edema. Neurologic: Alert and oriented x 3.  EKG:  SB.  Inferior MI and lateral MI, age indeterminate.  No acute changes.    ASSESSMENT AND PLAN:

## 2012-10-28 NOTE — Assessment & Plan Note (Signed)
Does not tolerate statin therapy.

## 2012-10-28 NOTE — Assessment & Plan Note (Signed)
He continues to remain stable.  Medical therapy is suggested.  Would not change treatment at this time.  Still on DAPT, but given the extent of disease and lack of ability to tolerate statins might be a reasonable option.

## 2012-10-30 ENCOUNTER — Other Ambulatory Visit: Payer: Self-pay | Admitting: *Deleted

## 2012-10-30 MED ORDER — CLOPIDOGREL BISULFATE 75 MG PO TABS: 75.0000 mg | ORAL_TABLET | Freq: Every day | ORAL | Status: DC

## 2012-11-19 ENCOUNTER — Other Ambulatory Visit: Payer: Self-pay | Admitting: Internal Medicine

## 2012-11-22 ENCOUNTER — Other Ambulatory Visit: Payer: Self-pay | Admitting: *Deleted

## 2012-11-22 MED ORDER — AMLODIPINE BESYLATE 5 MG PO TABS: 5.0000 mg | ORAL_TABLET | Freq: Every day | ORAL | Status: DC

## 2012-12-16 ENCOUNTER — Other Ambulatory Visit: Payer: Self-pay | Admitting: *Deleted

## 2012-12-16 MED ORDER — CLOPIDOGREL BISULFATE 75 MG PO TABS: 75.0000 mg | ORAL_TABLET | Freq: Every day | ORAL | Status: DC

## 2013-02-11 ENCOUNTER — Other Ambulatory Visit: Payer: Self-pay | Admitting: Cardiology

## 2013-02-11 ENCOUNTER — Telehealth: Payer: Self-pay | Admitting: *Deleted

## 2013-02-11 NOTE — Telephone Encounter (Signed)
Called to verify if Austin [name] was still taking Doxazosin 2mg , It is not on his current med list from 10/28/12. He confirmed that he is still taking the medication on 02/11/13. Will forward to Dr/RN.

## 2013-02-12 MED ORDER — DOXAZOSIN MESYLATE 2 MG PO TABS: 2.0000 mg | ORAL_TABLET | Freq: Every day | ORAL | Status: DC

## 2013-02-12 NOTE — Telephone Encounter (Signed)
I reviewed Dr. Riley Kill last office visit with pt on 10/2012 & called the patient to review his medications. He is taking the Cardura daily & was not told at last office visit to stop this medication.  He continues to take his Cardura as directed. There was nothing in the office note to indicate this medication being discontinued but it appeared to be removed from the list.  I have refilled this medication for patient and will forward to Mariah Milling RN

## 2013-02-18 ENCOUNTER — Other Ambulatory Visit: Payer: Self-pay

## 2013-04-11 ENCOUNTER — Ambulatory Visit (INDEPENDENT_AMBULATORY_CARE_PROVIDER_SITE_OTHER): Payer: Medicare PPO | Admitting: Cardiovascular Disease

## 2013-04-11 ENCOUNTER — Other Ambulatory Visit: Payer: Self-pay | Admitting: *Deleted

## 2013-04-11 ENCOUNTER — Encounter: Payer: Self-pay | Admitting: Cardiovascular Disease

## 2013-04-11 VITALS — BP 122/86 | HR 54 | Ht 73.0 in | Wt 210.0 lb

## 2013-04-11 DIAGNOSIS — I2510 Atherosclerotic heart disease of native coronary artery without angina pectoris: Secondary | ICD-10-CM

## 2013-04-11 DIAGNOSIS — I10 Essential (primary) hypertension: Secondary | ICD-10-CM

## 2013-04-11 DIAGNOSIS — E785 Hyperlipidemia, unspecified: Secondary | ICD-10-CM

## 2013-04-11 MED ORDER — EZETIMIBE 10 MG PO TABS: 10.0000 mg | ORAL_TABLET | Freq: Every day | ORAL | Status: DC

## 2013-04-11 NOTE — Patient Instructions (Signed)
Your physician has recommended you make the following change in your medication: START Zetia 10mg  take one by mouth daily  Your physician recommends that you return for a FASTING LIPID and LIVER in 3 MONTHS--nothing to eat or drink after midnight, lab opens at 7:30  Your physician wants you to follow-up in: 1 YEAR with Dr Excell Seltzer.  You will receive a reminder letter in the mail two months in advance. If you don't receive a letter, please call our office to schedule the follow-up appointment.
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
## 2013-04-13 ENCOUNTER — Encounter: Payer: Self-pay | Admitting: Cardiovascular Disease

## 2013-04-28 ENCOUNTER — Other Ambulatory Visit: Payer: Self-pay | Admitting: Cardiology

## 2013-04-29 ENCOUNTER — Other Ambulatory Visit: Payer: Self-pay | Admitting: Cardiovascular Disease

## 2013-05-01 ENCOUNTER — Telehealth: Payer: Self-pay | Admitting: Cardiovascular Disease

## 2013-05-01 DIAGNOSIS — I2510 Atherosclerotic heart disease of native coronary artery without angina pectoris: Secondary | ICD-10-CM

## 2013-05-01 DIAGNOSIS — I10 Essential (primary) hypertension: Secondary | ICD-10-CM

## 2013-05-01 DIAGNOSIS — E785 Hyperlipidemia, unspecified: Secondary | ICD-10-CM

## 2013-05-01 NOTE — Telephone Encounter (Signed)
I spoke with the pt and he took Zetia for about 2 weeks and then stopped the medication due to SOB and chest discomfort.  The pt said the first week he was fine but the 2nd week he developed symptoms and they worsened.  The pt has been off of Zetia for almost a week and his symptoms have completely resolved.  The pt has been able to exercise without any SOB and chest discomfort.  At this time the pt would like to try taking only 5mg  of Zetia daily.  I instructed the pt to stop this medication if he developed any further symptoms.  The pt will touch base with our office to let us know if he tolerates this medication.

## 2013-05-01 NOTE — Telephone Encounter (Signed)
Pt was put on zetia and sob, hurting in chest started after taking, has stopped meds, symptoms have stopped, pls call [phone]

## 2013-06-27 ENCOUNTER — Other Ambulatory Visit: Payer: Self-pay | Admitting: Cardiology

## 2013-07-03 ENCOUNTER — Other Ambulatory Visit: Payer: Self-pay

## 2013-07-15 ENCOUNTER — Other Ambulatory Visit: Payer: Medicare PPO

## 2013-07-17 ENCOUNTER — Other Ambulatory Visit: Payer: Medicare PPO

## 2013-07-21 ENCOUNTER — Other Ambulatory Visit (INDEPENDENT_AMBULATORY_CARE_PROVIDER_SITE_OTHER): Payer: Medicare PPO

## 2013-07-21 DIAGNOSIS — E785 Hyperlipidemia, unspecified: Secondary | ICD-10-CM

## 2013-07-21 DIAGNOSIS — I10 Essential (primary) hypertension: Secondary | ICD-10-CM

## 2013-07-21 DIAGNOSIS — I2510 Atherosclerotic heart disease of native coronary artery without angina pectoris: Secondary | ICD-10-CM

## 2013-07-21 DIAGNOSIS — C8589 Other specified types of non-Hodgkin lymphoma, extranodal and solid organ sites: Secondary | ICD-10-CM

## 2013-07-21 LAB — CBC WITH DIFFERENTIAL/PLATELET
Basophils Absolute: 0 10*3/uL (ref 0.0–0.1)
HCT: 37.7 % — ABNORMAL LOW (ref 39.0–52.0)
Lymphs Abs: 0.8 10*3/uL (ref 0.7–4.0)
MCV: 88.9 fl (ref 78.0–100.0)
Monocytes Absolute: 0.4 10*3/uL (ref 0.1–1.0)
Platelets: 115 10*3/uL — ABNORMAL LOW (ref 150.0–400.0)
RDW: 12.6 % (ref 11.5–14.6)

## 2013-07-21 LAB — LIPID PANEL: Cholesterol: 236 mg/dL — ABNORMAL HIGH (ref 0–200)

## 2013-07-21 LAB — HEPATIC FUNCTION PANEL
ALT: 18 U/L (ref 0–53)
AST: 20 U/L (ref 0–37)
Albumin: 4 g/dL (ref 3.5–5.2)
Alkaline Phosphatase: 77 U/L (ref 39–117)
Total Protein: 6.9 g/dL (ref 6.0–8.3)

## 2013-07-22 ENCOUNTER — Encounter: Payer: Self-pay | Admitting: Internal Medicine

## 2013-07-23 ENCOUNTER — Telehealth: Payer: Self-pay | Admitting: *Deleted

## 2013-07-23 NOTE — Telephone Encounter (Signed)
Faxed recent lab results to Dr. Molli Hazard Foster.//AB/CMA

## 2013-07-23 NOTE — Telephone Encounter (Signed)
Message copied by Verdie Shire on Wed Jul 23, 2013  5:15 PM ------      Message from: Pecola Lawless      Created: Tue Jul 22, 2013  5:19 PM       Please FAX  lab results to Dr  Benita Gutter [phone])       ------

## 2013-07-28 ENCOUNTER — Telehealth: Payer: Self-pay | Admitting: *Deleted

## 2013-07-28 ENCOUNTER — Encounter: Payer: Self-pay | Admitting: *Deleted

## 2013-07-28 NOTE — Telephone Encounter (Signed)
Faxed recent lab results to Dr. Benita Gutter @ [facility].//AB/CMA

## 2013-07-28 NOTE — Telephone Encounter (Signed)
Message copied by Verdie Shire on Mon Jul 28, 2013  1:36 PM ------      Message from: Pecola Lawless      Created: Mon Jul 28, 2013  8:31 AM       Please FAX  lab results to Dr Benita Gutter @ [place] [phone]) ------

## 2013-07-29 ENCOUNTER — Ambulatory Visit (INDEPENDENT_AMBULATORY_CARE_PROVIDER_SITE_OTHER): Payer: Medicare PPO | Admitting: Pharmacist

## 2013-07-29 DIAGNOSIS — Z79899 Other long term (current) drug therapy: Secondary | ICD-10-CM

## 2013-07-29 DIAGNOSIS — E785 Hyperlipidemia, unspecified: Secondary | ICD-10-CM

## 2013-07-29 MED ORDER — ROSUVASTATIN CALCIUM 5 MG PO TABS: 5.0000 mg | ORAL_TABLET | Freq: Every day | ORAL | Status: DC

## 2013-07-29 MED ORDER — COENZYME Q10 100 MG PO CAPS: 200.0000 mg | ORAL_CAPSULE | Freq: Every day | ORAL | Status: DC

## 2013-07-29 NOTE — Assessment & Plan Note (Addendum)
Given patient's CAD, and LDL > 170 mg/dL, would like to get him started on daily statin.  Patient often skips some medications 1-2 days per week if he starts feeling side effects.  He tolerated Crestor 40 mg qd for a few months back in 2012, but after six months the muscle aches started and he stopped.  He never tried a lower dose, and it doesn't appear he tried lipitor or simvastatin.  We discussed lower dose lipitor or crestor to start with, and ultimately elected to start Crestor 5 mg qd given he tolerated 40 mg daily well for a period of time a few years ago.  Will have him take Co-Enzyme Q 10 as well in hopes of helping with tolerability of Crestor.  Patient will take Crestor 5 days every week at least, and attempt all 7 days he tells me.  He is to eliminate chips, fries, and limit eggs in his diet.  He will recheck blood work and see me in 3 months. Plan: 1.  Start Crestor 5 mg daily.  Okay to take 5 days, then skip for weekend if muscle discomfort. 2.  Take Co-Enzyme Q-10, 200 mg daily - this may help reduce muscle aches from Crestor. 3.  Limit french fries and potato chips when eating out.  Substitute salad in place. 4.  Limit eggs to no more than 2-3 per week. 5.  Continue exercising 5-7 days per week. 6.  Recheck cholesterol in 3 months (10/27/13 fasting lab), then see me 1 day later to discuss (10/28/13 at 8:30)

## 2013-07-29 NOTE — Progress Notes (Signed)
Patient is a 67 y.o. Male with h/o MI in 2012 who is referred to lipid clinic due to LDL of 173 mg/dL recently off lipid lowering meds.  He failed Crestor, pravachol, and zetia in the past.  He's not sure if he tried other lipid lowering meds in the past.  Patient did tolerate Crestor 40 mg qd from 11/2012 up until 06/2011 when it started causing muscle aches.  During the time he took (and failed statins) he was on chemotherapy.  He is no longer on chemotherapy and cancer is in remission.    Risk factors:  MI, HTN, age - LDL goal < 70, non-HDL goal < 100. Meds:  Not on lipid lowering medication currently. Intolerant:  Crestor 40 mg qd (myalgias after 6 month of therapy), pravastatin 20 mg qd (Myalgias), Zetia 5 mg and 10 mg qd (SOB)  Social history:  Denies alcohol use, Denies tobacco use, retired Nurse, adult. Family History:  Father had h/o CVA.  Mother breast cancer and HTN.  Brother- HTN.  Sister - Breast Cancer.  Diet:  Breakfast - cereal (5 day/week) or boiled egg/toast/bacon (2x week).  Lunch- subway 6 inch sub or salad with chips.  Sometimes eats a burger with fries.  Dinner - chicken and vegetable typically.  Rarely eats desserts.  Drinks mostly water or juice.  Rarely drinks soda. Exercise:  Works out at his gym or at home 7 days per week.  He uses free weights and a treadmill.  Labs:   06/2013 - TC 236, TG 148, HDL 40, LDL 173, LFTs normal (not on lipid lowering meds) 09/2012 - LDL as high as 221 (not on meds)  Current Outpatient Prescriptions  Medication Sig Dispense Refill  . amLODipine (NORVASC) 5 MG tablet TAKE 1 TABLET DAILY  30 tablet  6  . clopidogrel (PLAVIX) 75 MG tablet TAKE 1 TABLET BY MOUTH EVERY DAY  90 tablet  0  . DORZOLAMIDE HCL OP Take 10 mg by mouth 2 times daily at 12 noon and 4 pm.      . doxazosin (CARDURA) 2 MG tablet Take 1 tablet (2 mg total) by mouth at bedtime.  90 tablet  3  . metoprolol tartrate (LOPRESSOR) 25 MG tablet TAKE 1/2 TABLET BY MOUTH TWICE DAILY  90  tablet  2  . timolol (BETIMOL) 0.25 % ophthalmic solution Place into both eyes 2 (two) times daily.       No current facility-administered medications for this visit.   Allergies  Allergen Reactions  . Zetia [Ezetimibe] Shortness Of Breath and Other (See Comments)    Chest pain  . Crestor [Rosuvastatin]     Crestor 40 mg caused muscle aches (2012) after 6 months of use  . Pravachol [Pravastatin Sodium]     pravachol 20 mg caused muscle aches (2013)   Family History  Problem Relation Age of Onset  . Cancer Mother     lung cancer  . Diabetes Mother   . Hyperlipidemia Mother   . Stroke Father     age 86  . Cancer Sister     ?lymphoma

## 2013-07-29 NOTE — Patient Instructions (Signed)
Plan: 1.  Start Crestor 5 mg daily.  Okay to take 5 days, then skip for weekend if muscle discomfort. 2.  Take Co-Enzyme Q-10, 200 mg daily - this may help reduce muscle aches from Crestor. 3.  Limit french fries and potato chips when eating out.  Substitute salad in place. 4.  Limit eggs to no more than 2-3 per week. 5.  Continue exercising 5-7 days per week. 6.  Recheck cholesterol in 3 months (10/27/13 fasting lab), then see me 1 day later to discuss (10/28/13 at 8:30)

## 2013-08-08 ENCOUNTER — Other Ambulatory Visit: Payer: Self-pay | Admitting: *Deleted

## 2013-08-08 MED ORDER — DOXAZOSIN MESYLATE 2 MG PO TABS: 2.0000 mg | ORAL_TABLET | Freq: Every day | ORAL | Status: DC

## 2013-10-27 ENCOUNTER — Other Ambulatory Visit: Payer: Medicare PPO

## 2013-10-28 ENCOUNTER — Ambulatory Visit: Payer: Medicare PPO | Admitting: Pharmacist

## 2013-10-31 ENCOUNTER — Other Ambulatory Visit: Payer: Commercial Managed Care - HMO

## 2013-10-31 ENCOUNTER — Ambulatory Visit (INDEPENDENT_AMBULATORY_CARE_PROVIDER_SITE_OTHER)
Admission: RE | Admit: 2013-10-31 | Discharge: 2013-10-31 | Disposition: A | Discharge status: Home / Self Care | Payer: Medicare PPO | Source: Ambulatory Visit | Attending: Internal Medicine | Admitting: Internal Medicine

## 2013-10-31 ENCOUNTER — Ambulatory Visit (INDEPENDENT_AMBULATORY_CARE_PROVIDER_SITE_OTHER): Payer: Medicare PPO | Admitting: Internal Medicine

## 2013-10-31 ENCOUNTER — Encounter: Payer: Self-pay | Admitting: Internal Medicine

## 2013-10-31 VITALS — BP 140/90 | HR 64 | Temp 98.2°F | Wt 210.8 lb

## 2013-10-31 DIAGNOSIS — R079 Chest pain, unspecified: Secondary | ICD-10-CM

## 2013-10-31 NOTE — Progress Notes (Signed)
   Subjective:    Patient ID: Austin [name], male    DOB: 06/24/1946, 68 y.o.   MRN: [id number]  HPI Symptoms began 5 days ago as a intermittent pain, variable dull and sharp, at the left lateral costal margin.  Patient reports hx of stent placement after MI in 2011 and describes the pain as at that site.  Pain sometimes radiate to the mid-sternum area. He associates these symptoms with morning medication intake.  He denies palpitations, chest heaviness, tingling or numbness in extremities, headache.  Aggravating factor is lying on L side; mitigating factor is exercise/movement.   Patient reports epistaxis occurring approximately once monthly, usually in context of after a hot shower.  Expressing concerns over Plavix dosage; meds currently managed by Dr. Burt Knack. Next appointment is scheduled for August.     Review of Systems No associated fever, chills, sweats, or weight loss are described. Chest pain, palpitations, edema, calf pain, or claudication are denied. Cough, dyspnea, sputum production, or hemoptysis are absent. There is no associated back pain with anterior radiation of discomfort. No associated weakness, numbness/tingling in arms. Absent are dyspepsia, significant reflux or dysphagia. No changes in skin temperature or color in the area of the symptoms. Rash or skin lesions absent. No history of abnormal bruising.    Objective:   Physical Exam  General appearance is one of good health and nourishment w/o distress.  Eyes: No conjunctival inflammation or scleral icterus is present.  Oral exam: Dental hygiene is good; lips and gums are healthy appearing.There is no oropharyngeal erythema or exudate noted.   Heart:  Normal rate and regular rhythm. S1 and S2 normal without gallop, murmur, click, rub or other extra sounds    Lungs:Chest clear to auscultation; no wheezes, rhonchi,rales ,or rubs present.No increased work of breathing.   Abdomen: bowel sounds normal, soft  and non-tender without masses, organomegaly or hernias noted.  Some guarding at the L lateral costal area. No rebound .   Musculoskeletal: Able to lie flat and sit up without help. Gait normal  Skin:Warm & dry.  Intact without suspicious lesions or rashes ; no jaundice or tenting.  Lymphatic: No lymphadenopathy is noted about the head, neck, axilla, or inguinal areas.      Assessment & Plan:  #1 EKG  #2 CXR/Abdominal US?  #3 CBC/CK/D-Dimer

## 2013-10-31 NOTE — Progress Notes (Signed)
   Subjective:    Patient ID: Austin [name], male    DOB: [birth year], 68 y.o.   MRN: [id number]  HPI Symptoms began 5 days ago as a intermittent pain, variable dull and sharp, at the left inferior rib cage  & lasting 1 hour. Prior to onset of this discomfort he had started doing weight exercises at the gym Patient reports hx of stent placement after MI in 2011 and describes the pain as at that site. Pain with CAD was not similar. Pain sometimes radiates to the mid-lateral sternum area but not arm or jaw. He associates these symptoms with morning medication intake. No pill dysphagia or dyspepsia described.No associated nausea or diaphoresis. He denies palpitations, chest heaviness, tingling or numbness in extremities, headache.  Aggravating factor is lying on L side; it is improved exercise/movement.Deep breathing or cough do not affect pain.      Review of Systems   The chest pain is not associated with back pain which radiates anteriorly.  There is no rash or associated change in color or temperature of skin in this area. Patient reports epistaxis occurring approximately once monthly, usually in context of taking a hot shower.  He is expressing concerns over Plavix dosage; meds currently managed by Dr. Burt Knack. Next appointment is scheduled for August.   He stopped his Crestor over a month ago because of myalgias. He denies changes in bowels as constipation or diarrhea. He has no melena or rectal bleeding.      Objective:   Physical Exam General appearance is one of good health and nourishment w/o distress.  Eyes: No conjunctival inflammation or scleral icterus is present. Arcus Oral exam: Dental hygiene is good; lips and gums are healthy appearing.There is no oropharyngeal erythema or exudate noted.  Heart: Normal rate and regular rhythm. S1 and S2 normal without gallop, click, rub or other extra sounds .There is a flow murmur. Lungs:Chest clear to auscultation; no wheezes,  rhonchi,rales ,or rubs present.No increased work of breathing.   Chest: Slight tenderness to palpation over the left inferior rib cage as well as the left mid  parasternal area. Abdomen: bowel sounds normal, soft and non-tender without masses, organomegaly or hernias noted. Some guarding at the L inferior lateral costal area. No rebound .  Musculoskeletal: Able to lie flat and sit up without help. Gait normal .Homan's negative. Vascular: All pulses intact with no deficit or bruits Skin:Warm & dry. Intact without suspicious lesions or rashes ; no jaundice or tenting.  Lymphatic: No lymphadenopathy is noted about the head, neck, axilla, or inguinal areas.         Assessment & Plan:  #1 chest wall pain most likely related to weightlifting; history does not suggest angina as he has no pain with  Exercise & pain is affected by position.  #2 statin intolerance; risk discussed  Plan: EKG; chest x-ray, d-dimer, & troponin 1

## 2013-10-31 NOTE — Progress Notes (Signed)
Pre visit review using our clinic review tool, if applicable. No additional management support is needed unless otherwise documented below in the visit note. 

## 2013-10-31 NOTE — Patient Instructions (Signed)
Use an anti-inflammatory cream such as Aspercreme or Zostrix cream twice a day to the affected area as needed. In lieu of this warm moist compresses or  hot water bottle can be used. Do not apply ice . Your next office appointment will be determined based upon review of your pending labs & x-rays. Those instructions will be transmitted to you through My Chart .

## 2013-11-01 LAB — D-DIMER, QUANTITATIVE: D-Dimer, Quant: 0.46 ug/mL-FEU (ref 0.00–0.48)

## 2013-11-01 LAB — TROPONIN I: Troponin I: 0.02 ng/mL (ref <0.06)

## 2013-11-03 ENCOUNTER — Other Ambulatory Visit: Payer: Medicare PPO

## 2013-11-04 ENCOUNTER — Ambulatory Visit: Payer: Medicare PPO | Admitting: Pharmacist

## 2013-12-19 ENCOUNTER — Other Ambulatory Visit: Payer: Self-pay | Admitting: Cardiovascular Disease

## 2013-12-19 ENCOUNTER — Other Ambulatory Visit: Payer: Self-pay | Admitting: Cardiology

## 2014-01-02 ENCOUNTER — Other Ambulatory Visit: Payer: Self-pay | Admitting: Cardiology

## 2014-03-16 ENCOUNTER — Other Ambulatory Visit: Payer: Self-pay | Admitting: Cardiovascular Disease

## 2014-03-22 ENCOUNTER — Other Ambulatory Visit: Payer: Self-pay | Admitting: Cardiovascular Disease

## 2014-03-24 ENCOUNTER — Other Ambulatory Visit: Payer: Self-pay | Admitting: Cardiology

## 2014-04-04 ENCOUNTER — Other Ambulatory Visit: Payer: Self-pay | Admitting: Cardiovascular Disease

## 2014-04-14 ENCOUNTER — Ambulatory Visit (INDEPENDENT_AMBULATORY_CARE_PROVIDER_SITE_OTHER): Payer: Commercial Managed Care - HMO | Admitting: Nurse Practitioner

## 2014-04-14 ENCOUNTER — Encounter: Payer: Self-pay | Admitting: Nurse Practitioner

## 2014-04-14 VITALS — BP 130/80 | HR 60 | Ht 73.0 in | Wt 211.1 lb

## 2014-04-14 DIAGNOSIS — E785 Hyperlipidemia, unspecified: Secondary | ICD-10-CM

## 2014-04-14 DIAGNOSIS — I10 Essential (primary) hypertension: Secondary | ICD-10-CM

## 2014-04-14 DIAGNOSIS — I2510 Atherosclerotic heart disease of native coronary artery without angina pectoris: Secondary | ICD-10-CM

## 2014-04-14 MED ORDER — METOPROLOL TARTRATE 25 MG PO TABS: ORAL_TABLET | ORAL | Status: DC

## 2014-04-14 MED ORDER — AMLODIPINE BESYLATE 5 MG PO TABS: ORAL_TABLET | ORAL | Status: DC

## 2014-04-14 NOTE — Patient Instructions (Addendum)
Stay on your current medicines  I have refilled the Norvasc and Metoprolol today  We will check fasting labs later this week  We will refer you to the Lipid Clinic here  We will arrange for a GXT - treadmill test  See Dr. Burt Knack in one year  Call the [place] office at [phone] if you have any questions, problems or concerns.

## 2014-04-14 NOTE — Progress Notes (Signed)
Austin [name] Date of Birth: [birth year] Medical Record #[id number]  History of Present Illness: Austin [name] is seen back today for a one year check. Seen for Austin [name]. Former patient of Austin [name]. He is 68 years of age. He has CAD with prior lateral MI in 2012 treated with primary PCI of the LCX. Has residual disease involving the diagonal branch of the LAD and PDA of the RCA - medical therapy recommended. EF of 45%. Other issues include HLD - statin intolerant, HTN, follicular lymphoma, and HTN.  Last seen a year ago - cardiac status stable - remains on DAPT.  Comes back today. Here alone. Doing ok. No chest pain. Not short of breath. He is exercising about 3 times a week - walks on the treadmill. Has done some light weights. Noted that with the pull down machine he had some chest soreness - he stopped using and has had no more. He has no exertional symptoms. Mild shortness of breath with inclines. Needs medicine refilled. Now taking some "alternative medicine" - sounds like Red Yeast Rice. Not fasting today. Overall, he feels like he is doing ok.   Current Outpatient Prescriptions  Medication Sig Dispense Refill  . amLODipine (NORVASC) 5 MG tablet TAKE 1 TABLET BY MOUTH ONCE DAILY  90 tablet  0  . clopidogrel (PLAVIX) 75 MG tablet TAKE 1 TABLET BY MOUTH EVERY DAY  90 tablet  1  . DORZOLAMIDE HCL OP Take 10 mg by mouth 2 times daily at 12 noon and 4 pm.      . doxazosin (CARDURA) 2 MG tablet TAKE 1 TABLET AT BEDTIME  90 tablet  1  . metoprolol tartrate (LOPRESSOR) 25 MG tablet TAKE 1/2 TABLET BY MOUTH TWICE DAILY  90 tablet  0  . timolol (BETIMOL) 0.25 % ophthalmic solution Place into both eyes 2 (two) times daily.       No current facility-administered medications for this visit.    Allergies  Allergen Reactions  . Zetia [Ezetimibe] Shortness Of Breath and Other (See Comments)    Chest pain  . Crestor [Rosuvastatin]     Crestor 40 mg caused muscle aches (2012) after 6  months of use  . Pravachol [Pravastatin Sodium]     pravachol 20 mg caused muscle aches (2013)    Past Medical History  Diagnosis Date  . History of nephrolithiasis     Austin [name]  . Lymphoma, follicular     Austin [name]  . Allergy   . HTN (hypertension)   . Hyperlipidemia     NMR Lipoprofile 2008: LDL 168 ( 2410/ 1826), HDL 41, TG 71. LDL goal = <100, ideally <  70. Framingham Study LDL goal = < 130.  Marland Kitchen Coronary artery disease   . Myocardial infarction   . Hyperlipemia     Past Surgical History  Procedure Laterality Date  . Esophagela stricture      colonoscopy x2 EGD 2002  . Knee arthroscopy    . Cardiac surgery      History  Smoking status  . Never Smoker   Smokeless tobacco  . Never Used    History  Alcohol Use No    Family History  Problem Relation Age of Onset  . Cancer Mother     lung cancer  . Diabetes Mother   . Hyperlipidemia Mother   . Stroke Father     age 31  . Cancer Sister     ?lymphoma    Review of Systems:  The review of systems is per the HPI.  All other systems were reviewed and are negative.  Physical Exam: BP 130/80  Pulse 60  Ht 6\' 1"  (1.854 m)  Wt 211 lb 1.9 oz (95.763 kg)  BMI 27.86 kg/m2  SpO2 99% Patient is very pleasant and in no acute distress. Skin is warm and dry. Color is normal.  HEENT is unremarkable. Normocephalic/atraumatic. PERRL. Sclera are nonicteric. Neck is supple. No masses. No JVD. Lungs are clear. Cardiac exam shows a regular rate and rhythm. Abdomen is soft. Extremities are without edema. Gait and ROM are intact. No gross neurologic deficits noted.  Wt Readings from Last 3 Encounters:  04/14/14 211 lb 1.9 oz (95.763 kg)  10/31/13 210 lb 12.8 oz (95.618 kg)  04/11/13 210 lb (95.255 kg)    LABORATORY DATA/PROCEDURES:  Lab Results  Component Value Date   WBC 2.7* 07/21/2013   HGB 12.9* 07/21/2013   HCT 37.7* 07/21/2013   PLT 115.0* 07/21/2013   GLUCOSE 91 12/08/2011   CHOL 236* 07/21/2013    TRIG 148.0 07/21/2013   HDL 40.10 07/21/2013   LDLDIRECT 173.2 07/21/2013   LDLCALC 52 03/31/2011   ALT 18 07/21/2013   AST 20 07/21/2013   NA 138 12/08/2011   K 4.5 12/08/2011   CL 103 12/08/2011   CREATININE 1.06 12/08/2011   BUN 17 12/08/2011   CO2 28 12/08/2011   PSA 2.58 03/03/2010   INR 1.20 12/02/2010   HGBA1C  Value: 5.9 (NOTE)                                                                       According to the ADA Clinical Practice Recommendations for 2011, when HbA1c is used as a screening test:   >=6.5%   Diagnostic of Diabetes Mellitus           (if abnormal result  is confirmed)  5.7-6.4%   Increased risk of developing Diabetes Mellitus  References:Diagnosis and Classification of Diabetes Mellitus,Diabetes YIFO,2774,12(INOMV 1):S62-S69 and Standards of Medical Care in         Diabetes - 2011,Diabetes Care,2011,34  (Suppl 1):S11-S61.* 12/02/2010    BNP (last 3 results) No results found for this basename: PROBNP,  in the last 8760 hours   Assessment / Plan:  1. CAD - no real active symptoms. Has known residual disease - will arrange for GXT.  2. HTN - BP ok - continue with current regimen.   3. HLD - statin intolerant - may benefit from lipid clinic (try 3x week statin, statin + vit D, or new agent) - needs labs checked when he is fasting later this week. Was not even able to tolerate Zetia.   Tentatively see back in a year.    Patient is agreeable to this plan and will call if any problems develop in the interim.   Austin Junes, RN, [facility] [address] [place], [place]  [address] [phone]

## 2014-04-17 ENCOUNTER — Other Ambulatory Visit: Payer: Medicare PPO

## 2014-04-17 ENCOUNTER — Other Ambulatory Visit: Payer: Self-pay | Admitting: Cardiovascular Disease

## 2014-04-20 ENCOUNTER — Other Ambulatory Visit: Payer: Self-pay | Admitting: Cardiology

## 2014-04-28 ENCOUNTER — Ambulatory Visit (INDEPENDENT_AMBULATORY_CARE_PROVIDER_SITE_OTHER): Payer: Commercial Managed Care - HMO | Admitting: Pharmacist

## 2014-04-28 VITALS — Wt 208.0 lb

## 2014-04-28 DIAGNOSIS — E785 Hyperlipidemia, unspecified: Secondary | ICD-10-CM

## 2014-04-28 NOTE — Progress Notes (Signed)
Patient is a 68 y.o. Male with h/o MI in 2012 who was referred to lipid clinic due to LDL of 173 mg/dL recently off lipid lowering meds.  He failed Crestor at doses of 2.5 mg qd, 5 mg qd, and 40 mg qd due to muscle aches.  He also failed pravachol, and zetia in the past.  He's not sure if he tried other lipid lowering meds in the past.  Not interested in statin therapy if possible, but is interested in hearing about other options since he failed multiple statins at varying doses.  His household income is ~ $40,000 for a household of 3.    Risk factors:  MI, HTN, age - LDL goal < 70, non-HDL goal < 100. Meds:  Not on lipid lowering medication currently. Intolerant:  Crestor 5 mg qd, 2.5 mg qd, 40 mg qd, pravastatin 20 mg qd (Myalgias), Zetia 5 mg and 10 mg qd (SOB)  Social history:  Denies alcohol use, Denies tobacco use, retired Higher education careers adviser. Family History:  Father had h/o CVA.  Mother breast cancer and HTN.  Brother- HTN.  Sister - Breast Cancer.  Diet:  Breakfast - cereal (5 day/week) or boiled egg/toast/bacon (2x week).  Lunch- subway 6 inch sub or salad with chips.  Sometimes eats a burger with fries.  Dinner - chicken and vegetable typically.  Rarely eats desserts.  Drinks mostly water or juice.  Rarely drinks soda. Exercise:  Works out at his gym or at home 7 days per week.  He uses free weights and a treadmill.  Labs:   06/2013 - TC 236, TG 148, HDL 40, LDL 173, LFTs normal (not on lipid lowering meds) 09/2012 - LDL as high as 221 (not on meds)  Current Outpatient Prescriptions  Medication Sig Dispense Refill  . amLODipine (NORVASC) 5 MG tablet TAKE 1 TABLET BY MOUTH ONCE DAILY  90 tablet  3  . amLODipine (NORVASC) 5 MG tablet TAKE 1 TABLET BY MOUTH ONCE DAILY  30 tablet  5  . clopidogrel (PLAVIX) 75 MG tablet TAKE 1 TABLET BY MOUTH EVERY DAY  90 tablet  1  . DORZOLAMIDE HCL OP Take 10 mg by mouth 2 times daily at 12 noon and 4 pm.      . doxazosin (CARDURA) 2 MG tablet TAKE 1 TABLET AT  BEDTIME  90 tablet  1  . metoprolol tartrate (LOPRESSOR) 25 MG tablet TAKE 1/2 TABLET BY MOUTH TWICE DAILY  90 tablet  3  . timolol (BETIMOL) 0.25 % ophthalmic solution Place into both eyes 2 (two) times daily.       No current facility-administered medications for this visit.   Allergies  Allergen Reactions  . Zetia [Ezetimibe] Shortness Of Breath and Other (See Comments)    Chest pain  . Crestor [Rosuvastatin]     Crestor 2.5 mg qd (09/2013), 5 mg qd (08/2013), and 40 mg qd caused muscle aches (2012) after 6 months of use  . Pravachol [Pravastatin Sodium]     pravachol 20 mg caused muscle aches (2013)   Family History  Problem Relation Age of Onset  . Cancer Mother     lung cancer  . Diabetes Mother   . Hyperlipidemia Mother   . Stroke Father     age 45  . Cancer Sister     ?lymphoma

## 2014-04-28 NOTE — Assessment & Plan Note (Signed)
Patient and I had a long discussion about his treatment options.  He is not excited about the idea of going back on another statin given he has failed multiple statins, at varying doses.  We did discuss Praluent given he would qualify for patient assistance (< $64K for household of 2, < $80K for household of 3) and he has a h/o MI in 2012.  This could potentially get his LDL reduced 50-65% depending on dose of Praluent.  Patient was interested in going the route of Praluent, but would like to read some literature on Praluent first.  I gave him some patient information and told him where he could find more information on the product.  As another option, atorvastatin 2-3 times per week could be tried.  Patient is going to read through the literature and call me back later this month.  Will decide if Praluent 75 mg SQ q 2 weeks through patient assistance, or if atorvastatin 10 mg 2-3 times per week, will be used at that time.

## 2014-04-28 NOTE — Patient Instructions (Signed)
Read through literature on Praluent and call back with answer if you are comfortable starting on this medication.  Alternative could be atorvastatin 10 mg 2-3 times per week.

## 2014-05-19 ENCOUNTER — Ambulatory Visit (INDEPENDENT_AMBULATORY_CARE_PROVIDER_SITE_OTHER): Payer: Medicare PPO | Admitting: Physician Assistant

## 2014-05-19 DIAGNOSIS — E785 Hyperlipidemia, unspecified: Secondary | ICD-10-CM

## 2014-05-19 DIAGNOSIS — I2510 Atherosclerotic heart disease of native coronary artery without angina pectoris: Secondary | ICD-10-CM

## 2014-05-19 DIAGNOSIS — R9439 Abnormal result of other cardiovascular function study: Secondary | ICD-10-CM

## 2014-05-19 DIAGNOSIS — I10 Essential (primary) hypertension: Secondary | ICD-10-CM

## 2014-05-19 DIAGNOSIS — R079 Chest pain, unspecified: Secondary | ICD-10-CM

## 2014-05-19 NOTE — Progress Notes (Signed)
Exercise Treadmill Test  Pre-Exercise Testing Evaluation Rhythm: normal sinus  Rate: 60 bpm     Test  Exercise Tolerance Test Ordering MD: Sherren Mocha, MD  Interpreting MD: Richardson Dopp, PA-C  Unique Test No: 1  Treadmill:  1  Indication for ETT: known ASHD  Contraindication to ETT: No   Stress Modality: exercise - treadmill  Cardiac Imaging Performed: non   Protocol: standard Bruce - maximal  Max BP:  196/89  Max MPHR (bpm):  151 85% MPR (bpm):  128  MPHR obtained (bpm):  131 % MPHR obtained:  87  Reached 85% MPHR (min:sec):  8:06 Total Exercise Time (min-sec):  9:00  Workload in METS:  10.1 Borg Scale: 12  Reason ETT Terminated:  desired heart rate attained    ST Segment Analysis At Rest: normal ST segments - no evidence of significant ST depression With Exercise: significant ischemic ST depression  Other Information Arrhythmia:  No Angina during ETT:  present (1) Quality of ETT:  diagnostic  ETT Interpretation:  abnormal - evidence of ST depression consistent with ischemia  Comments: Good exercise capacity. Patient did c/o chest pain.  He relates this upper body exercise he did this AM. Frequent PVCs. Normal BP response to exercise. There was 1-2 mm ST depression noted in anterolateral leads.  Recommendations: Routine ETT done for FU.  Patient with some chest discomfort on the treadmill. However, he has had this chronically and relates it to upper body exercises.  If he does not do these, he does not get chest discomfort. However, there is significant ST depression with exercise. Will proceed with ETT-Myoview. Signed,  Richardson Dopp, PA-C   05/19/2014 9:56 AM

## 2014-06-02 ENCOUNTER — Ambulatory Visit (HOSPITAL_COMMUNITY): Payer: Medicare PPO | Attending: Cardiology | Admitting: Radiology

## 2014-06-02 VITALS — BP 139/78 | HR 55 | Ht 73.0 in | Wt 209.0 lb

## 2014-06-02 DIAGNOSIS — R079 Chest pain, unspecified: Secondary | ICD-10-CM | POA: Insufficient documentation

## 2014-06-02 DIAGNOSIS — I10 Essential (primary) hypertension: Secondary | ICD-10-CM | POA: Diagnosis not present

## 2014-06-02 DIAGNOSIS — R9439 Abnormal result of other cardiovascular function study: Secondary | ICD-10-CM

## 2014-06-02 DIAGNOSIS — R0600 Dyspnea, unspecified: Secondary | ICD-10-CM | POA: Diagnosis not present

## 2014-06-02 DIAGNOSIS — I2510 Atherosclerotic heart disease of native coronary artery without angina pectoris: Secondary | ICD-10-CM | POA: Insufficient documentation

## 2014-06-02 MED ORDER — TECHNETIUM TC 99M SESTAMIBI GENERIC - CARDIOLITE
33.0000 | Freq: Once | INTRAVENOUS | Status: AC | PRN
Start: 2014-06-02 — End: 2014-06-02
  Administered 2014-06-02: 33 via INTRAVENOUS

## 2014-06-02 MED ORDER — TECHNETIUM TC 99M SESTAMIBI GENERIC - CARDIOLITE
11.0000 | Freq: Once | INTRAVENOUS | Status: AC | PRN
  Administered 2014-06-02: 11 via INTRAVENOUS

## 2014-06-02 NOTE — Progress Notes (Signed)
[facility] [address] [place], [place] [address] [phone]    Cardiology Nuclear Med Study  Austin [name] is a 68 y.o. male     MRN : [id number]     DOB: 07-30-1946  Procedure Date: 06/02/2014  Nuclear Med Background Indication for Stress Test:  Evaluation for Ischemia, Abnormal EKG and Follow up CAD History:  CAD, and 5 to 10 years PXT:[id number] Perfusion Imaging:normal per patient (done at [facility]) Cardiac Risk Factors: Hypertension  Symptoms: Chest soreness without exertion (last occurrence one month ago), DOE   Nuclear Pre-Procedure Caffeine/Decaff Intake:  None NPO After: 12:00am   Lungs:  clear O2 Sat: 97% on room air. IV 0.9% NS with Angio Cath:  20g  IV Site: R Wrist  IV Started by:  Matilde Haymaker, RN  Chest Size (in):  44 Cup Size: n/a  Height: 6\' 1"  (1.854 m)  Weight:  209 lb (94.802 kg)  BMI:  Body mass index is 27.58 kg/(m^2). Tech Comments:  No Metoprolol x 12 hrs    Nuclear Med Study 1 or 2 day study: 1 day  Stress Test Type:  Stress  Reading MD: n/a  Order Authorizing Provider:  Legrand Como Cooper,MD  Resting Radionuclide: Technetium 36m Sestamibi  Resting Radionuclide Dose: 11.0 mCi   Stress Radionuclide:  Technetium 60m Sestamibi  Stress Radionuclide Dose: 33.0 mCi           Stress Protocol Rest HR: 55 Stress HR: 148  Rest BP: 139/78 Stress BP: 205/81  Exercise Time (min): 9:30 METS: 11.0   Predicted Max HR: 151 bpm % Max HR: 98.01 bpm Rate Pressure Product: 30340   Dose of Adenosine (mg):  n/a Dose of Lexiscan: n/a mg  Dose of Atropine (mg): n/a Dose of Dobutamine: n/a mcg/kg/min (at max HR)  Stress Test Technologist: Irven Baltimore, RN  Nuclear Technologist:  Earl Many, CNMT     Rest Procedure:  Myocardial perfusion imaging was performed at rest 45 minutes following the intravenous administration of Technetium 69m Sestamibi. Rest ECG: Normal sinus rhythm. Inferior Q waves consistent with a prior  inferior MI. There is ST flattening in the lateral precordial leads.  Stress Procedure:  The patient exercised on the treadmill utilizing the Bruce Protocol for 9:30 minutes, RPE=16. The patient stopped due to DOE and denied any chest pain. There was a mild hypertensive response to exercise. Technetium 17m Sestamibi was injected at peak exercise and myocardial perfusion imaging was performed after a brief delay. Stress ECG: With stress there is slight worsening of the ST changes in the lateral precordial leads. This is not a diagnostic finding.  QPS Raw Data Images:  Normal; no motion artifact; normal heart/lung ratio. Stress Images:  There is a medium-sized area of moderate/severe decreased uptake affecting the mid-inferolateral segment, the apical-lateral segment, and the base/mid anterolateral segments. Rest Images:  The rest images are the same as the stress images. Subtraction (SDS):  No evidence of ischemia. Transient Ischemic Dilatation (Normal <1.22):  0.90 Lung/Heart Ratio (Normal <0.45):  0.37  Quantitative Gated Spect Images QGS EDV:  102 ml QGS ESV:  53 ml  Impression Exercise Capacity:  Good exercise capacity. BP Response:  Normal blood pressure response. Clinical Symptoms:  He had dyspnea with exertion ECG Impression:  The resting EKG is abnormal. ST segments become mildly more abnormal. This is not a diagnostic change. Comparison with Prior Nuclear Study: No images to compare  Overall Impression:  The study is abnormal. This is a  low risk scan. There is mild reduction in the ejection fraction. There is a medium-sized scar of moderate severity affecting the anterolateral wall and the mid-inferolateral wall. There is no definite ischemia.  LV Ejection Fraction: 48%.  LV Wall Motion:  Mild hypokinesis of the lateral wall.  Austin [name]

## 2014-06-03 ENCOUNTER — Encounter: Payer: Self-pay | Admitting: Physician Assistant

## 2014-11-24 ENCOUNTER — Telehealth: Payer: Self-pay | Admitting: Cardiovascular Disease

## 2014-11-24 ENCOUNTER — Other Ambulatory Visit (HOSPITAL_COMMUNITY): Payer: Self-pay | Admitting: Cardiovascular Disease

## 2014-11-24 MED ORDER — DOXAZOSIN MESYLATE 2 MG PO TABS: 2.0000 mg | ORAL_TABLET | Freq: Every day | ORAL | Status: DC

## 2014-11-24 NOTE — Telephone Encounter (Signed)
New message        Pt would like Lauren to give him a call

## 2014-11-24 NOTE — Telephone Encounter (Signed)
I spoke with the pt and he requested a refill on doxazosin. Rx sent.

## 2014-12-11 DIAGNOSIS — H16223 Keratoconjunctivitis sicca, not specified as Sjogren's, bilateral: Secondary | ICD-10-CM | POA: Diagnosis not present

## 2014-12-11 DIAGNOSIS — H4011X2 Primary open-angle glaucoma, moderate stage: Secondary | ICD-10-CM | POA: Diagnosis not present

## 2014-12-21 ENCOUNTER — Other Ambulatory Visit (HOSPITAL_COMMUNITY): Payer: Self-pay | Admitting: Cardiovascular Disease

## 2015-02-03 DIAGNOSIS — Z8579 Personal history of other malignant neoplasms of lymphoid, hematopoietic and related tissues: Secondary | ICD-10-CM | POA: Diagnosis not present

## 2015-02-03 DIAGNOSIS — I2510 Atherosclerotic heart disease of native coronary artery without angina pectoris: Secondary | ICD-10-CM | POA: Diagnosis not present

## 2015-02-03 DIAGNOSIS — Z08 Encounter for follow-up examination after completed treatment for malignant neoplasm: Secondary | ICD-10-CM | POA: Diagnosis not present

## 2015-02-03 DIAGNOSIS — C8590 Non-Hodgkin lymphoma, unspecified, unspecified site: Secondary | ICD-10-CM | POA: Diagnosis not present

## 2015-02-03 DIAGNOSIS — C8290 Follicular lymphoma, unspecified, unspecified site: Secondary | ICD-10-CM | POA: Diagnosis not present

## 2015-02-03 DIAGNOSIS — Z79899 Other long term (current) drug therapy: Secondary | ICD-10-CM | POA: Diagnosis not present

## 2015-02-03 DIAGNOSIS — Z923 Personal history of irradiation: Secondary | ICD-10-CM | POA: Diagnosis not present

## 2015-02-03 DIAGNOSIS — D696 Thrombocytopenia, unspecified: Secondary | ICD-10-CM | POA: Diagnosis not present

## 2015-02-03 DIAGNOSIS — Z9221 Personal history of antineoplastic chemotherapy: Secondary | ICD-10-CM | POA: Diagnosis not present

## 2015-02-03 DIAGNOSIS — D72819 Decreased white blood cell count, unspecified: Secondary | ICD-10-CM | POA: Diagnosis not present

## 2015-02-03 DIAGNOSIS — Z7902 Long term (current) use of antithrombotics/antiplatelets: Secondary | ICD-10-CM | POA: Diagnosis not present

## 2015-02-04 ENCOUNTER — Telehealth: Payer: Self-pay | Admitting: Cardiovascular Disease

## 2015-02-04 NOTE — Telephone Encounter (Signed)
Doesn't sound like anything to worry about as long as chest pain is musculoskeletal. He has had HR's as low as 49 dating back to 2012 and as long as no sx's this can be followed without any changes.

## 2015-02-04 NOTE — Telephone Encounter (Signed)
Pt c/o of Chest Pain: STAT if CP now or developed within 24 hours  1. Are you having CP right now? No  2. Are you experiencing any other symptoms (ex. SOB, nausea, vomiting, sweating)? No  3. How long have you been experiencing CP? 1wk off and on  4. Is your CP continuous or coming and going? Coming and going  5. Have you taken Nitroglycerin? No   Pt stated he went to his Oncologist and was told his heart rate was 48 ?

## 2015-02-04 NOTE — Telephone Encounter (Signed)
I spoke with the pt and made him aware of Dr Antionette Char comments.  The pt does notice his chest discomfort when he moves his upper body.  I advised the pt to continue to monitor his symptoms and contact the office if he notices any change. Pt agreed with plan.

## 2015-02-04 NOTE — Telephone Encounter (Signed)
I spoke with the pt and he saw his oncologist yesterday and his pulse was 48. He states the nurse got very concerned about his heart rate and they made him stay in the office for observation and his pulse went up to 49.  The pt had taken his metoprolol tartrate prior to appointment.  Oncology advised the pt to contact cardiology.  The pt also has experienced pain in his chest off and on over the past week and he relates this to his exercise regimen and also driving his truck.  He states that he has very large tires on his truck which makes turning his steering wheel difficult. I made the pt aware that I would discuss this with Dr Burt Knack and determine if he has any other recommendations at this time.

## 2015-03-03 ENCOUNTER — Other Ambulatory Visit (HOSPITAL_COMMUNITY): Payer: Self-pay | Admitting: Cardiovascular Disease

## 2015-03-04 ENCOUNTER — Other Ambulatory Visit: Payer: Self-pay

## 2015-03-04 MED ORDER — DOXAZOSIN MESYLATE 2 MG PO TABS: 2.0000 mg | ORAL_TABLET | Freq: Every day | ORAL | Status: DC

## 2015-03-26 ENCOUNTER — Telehealth: Payer: Self-pay | Admitting: Cardiovascular Disease

## 2015-03-26 ENCOUNTER — Other Ambulatory Visit: Payer: Self-pay

## 2015-03-26 MED ORDER — CLOPIDOGREL BISULFATE 75 MG PO TABS: 75.0000 mg | ORAL_TABLET | Freq: Every day | ORAL | Status: DC

## 2015-03-26 NOTE — Telephone Encounter (Signed)
error 

## 2015-03-29 ENCOUNTER — Other Ambulatory Visit: Payer: Self-pay | Admitting: Cardiovascular Disease

## 2015-03-31 ENCOUNTER — Other Ambulatory Visit: Payer: Self-pay | Admitting: Nurse Practitioner

## 2015-04-01 DIAGNOSIS — H4011X2 Primary open-angle glaucoma, moderate stage: Secondary | ICD-10-CM | POA: Diagnosis not present

## 2015-06-01 ENCOUNTER — Other Ambulatory Visit: Payer: Self-pay | Admitting: Cardiovascular Disease

## 2015-06-15 ENCOUNTER — Encounter: Payer: Self-pay | Admitting: Cardiovascular Disease

## 2015-06-18 ENCOUNTER — Ambulatory Visit (INDEPENDENT_AMBULATORY_CARE_PROVIDER_SITE_OTHER): Payer: Commercial Managed Care - HMO | Admitting: Cardiovascular Disease

## 2015-06-18 ENCOUNTER — Encounter: Payer: Self-pay | Admitting: Cardiovascular Disease

## 2015-06-18 VITALS — BP 162/80 | HR 52 | Ht 73.0 in | Wt 206.4 lb

## 2015-06-18 DIAGNOSIS — E785 Hyperlipidemia, unspecified: Secondary | ICD-10-CM

## 2015-06-18 DIAGNOSIS — I2510 Atherosclerotic heart disease of native coronary artery without angina pectoris: Secondary | ICD-10-CM

## 2015-06-18 DIAGNOSIS — I10 Essential (primary) hypertension: Secondary | ICD-10-CM | POA: Diagnosis not present

## 2015-06-18 LAB — COMPREHENSIVE METABOLIC PANEL
ALBUMIN: 3.9 g/dL (ref 3.6–5.1)
ALK PHOS: 79 U/L (ref 40–115)
ALT: 14 U/L (ref 9–46)
AST: 19 U/L (ref 10–35)
BUN: 13 mg/dL (ref 7–25)
CO2: 27 mmol/L (ref 20–31)
Calcium: 9.6 mg/dL (ref 8.6–10.3)
Chloride: 104 mmol/L (ref 98–110)
Creat: 0.94 mg/dL (ref 0.70–1.18)
Glucose, Bld: 84 mg/dL (ref 65–99)
POTASSIUM: 4.1 mmol/L (ref 3.5–5.3)
Sodium: 139 mmol/L (ref 135–146)
TOTAL PROTEIN: 7.1 g/dL (ref 6.1–8.1)
Total Bilirubin: 0.8 mg/dL (ref 0.2–1.2)

## 2015-06-18 LAB — LIPID PANEL
CHOLESTEROL: 230 mg/dL — AB (ref 125–200)
HDL: 39 mg/dL — ABNORMAL LOW (ref >=40)
LDL Cholesterol: 172 mg/dL — ABNORMAL HIGH (ref <130)
Total CHOL/HDL Ratio: 5.9 Ratio — ABNORMAL HIGH (ref <=5.0)
Triglycerides: 95 mg/dL (ref <150)
VLDL: 19 mg/dL (ref <30)

## 2015-06-18 NOTE — Progress Notes (Signed)
Cardiology Office Note Date:  06/18/2015   ID:  Hosea, Han[place]alt 02-12-1946, MRN [id number]  PCP:  Unice Cobble, MD  Cardiologist:  Sherren Mocha, MD    Chief Complaint  Patient presents with  . Annual Exam     History of Present Illness: Austin [name] is a 69 y.o. male who presents for follow-up evaluation. The patient is followed for coronary artery disease with a history of myocardial infarction. He presented with a lateral wall infarction in 2012 and was treated with primary PCI of the left circumflex. He also was noted to have residual disease involving the diagonal branches of the LAD and the PDA branch of the right coronary artery. Medical therapy was recommended for his residual coronary disease. His left ventricular ejection fraction was in the range of 45%. The patient is statin intolerant.  Patient is doing very well. He is going to the gym at least 2 days every week and exercising without exertional symptoms. He specifically denies chest pain, chest pressure, or shortness of breath. He is compliant with his medications. He did not take his antihypertensive medications this morning as he is fasting today. He has no specific complaints.  Past Medical History  Diagnosis Date  . History of nephrolithiasis     Dr. Terance Hart  . 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  [facility]   . HTN (hypertension)   . Hyperlipidemia     NMR Lipoprofile 2008: LDL 168 ( 2410/ 1826), HDL 41, TG 71. LDL goal = <100, ideally <  70. Framingham Study LDL goal = < 130.  Marland Kitchen Coronary artery disease   . Myocardial infarction ([place])   . Hyperlipemia   . Hx of cardiovascular stress test     ETT-Myoview (10/15): Nondiagnostic EKG changes, anterolateral and inferolateral scar, no ischemia, EF 48%, low risk    Past Surgical History  Procedure Laterality Date  . Esophagela stricture      colonoscopy x2 EGD 2002  . Knee arthroscopy    . Cardiac surgery      Current  Outpatient Prescriptions  Medication Sig Dispense Refill  . amLODipine (NORVASC) 5 MG tablet Take 5 mg by mouth daily.    Marland Kitchen aspirin 81 MG tablet Take 81 mg by mouth daily.    . clopidogrel (PLAVIX) 75 MG tablet Take 1 tablet (75 mg total) by mouth daily. 90 tablet 0  . DORZOLAMIDE HCL OP Take 10 mg by mouth 2 times daily at 12 noon and 4 pm.    . doxazosin (CARDURA) 2 MG tablet Take 1 tablet (2 mg total) by mouth daily. 90 tablet 0  . metoprolol tartrate (LOPRESSOR) 25 MG tablet TAKE 1/2 TABLET BY MOUTH TWICE DAILY 90 tablet 3  . timolol (BETIMOL) 0.25 % ophthalmic solution Place into both eyes 2 (two) times daily.     No current facility-administered medications for this visit.    Allergies:   Zetia; Crestor; and Pravachol   Social History:  The patient  reports that he has never smoked. He has never used smokeless tobacco. He reports that he does not drink alcohol or use illicit drugs.   Family History:  The patient's  family history includes Cancer in his mother and sister; Diabetes in his mother; Hyperlipidemia in his mother; Stroke in his father.    ROS:  Please see the history of present illness.  Otherwise, review of systems is positive for back pain, muscle pain.  All other systems are  reviewed and negative.    PHYSICAL EXAM: VS:  BP 162/80 mmHg  Pulse 52  Ht _0  (1.854 m)  Wt 206 lb 6.4 oz (93.622 kg)  BMI 27.24 kg/m2 , BMI Body mass index is 27.24 kg/(m^2). GEN: Well nourished, well developed, in no acute distress HEENT: normal Neck: no JVD, no masses. No carotid bruits Cardiac: RRR without murmur or gallop                Respiratory:  clear to auscultation bilaterally, normal work of breathing GI: soft, nontender, nondistended, + BS MS: no deformity or atrophy Ext: no pretibial edema, pedal pulses 2+= bilaterally Skin: warm and dry, no rash Neuro:  Strength and sensation are intact Psych: euthymic mood, full affect  EKG:  EKG is ordered today. The ekg ordered  today shows sinus bradycardia 52 bpm, occasional PVC, T-wave abnormality consider lateral ischemia  Recent Labs: No results found for requested labs within last 365 days.   Lipid Panel     Component Value Date/Time   CHOL 236* 07/21/2013 0809   TRIG 148.0 07/21/2013 0809   HDL 40.10 07/21/2013 0809   CHOLHDL 6 07/21/2013 0809   VLDL 29.6 07/21/2013 0809   LDLCALC 52 03/31/2011 0845   LDLDIRECT 173.2 07/21/2013 0809      Wt Readings from Last 3 Encounters:  06/18/15 206 lb 6.4 oz (93.622 kg)  06/02/14 209 lb (94.802 kg)  04/28/14 208 lb (94.348 kg)     Cardiac Studies Reviewed: ETT 9-22--2015: ETT Interpretation: abnormal - evidence of ST depression consistent with ischemia  Comments: Good exercise capacity. Patient did c/o chest pain. He relates this upper body exercise he did this AM. Frequent PVCs. Normal BP response to exercise. There was 1-2 mm ST depression noted in anterolateral leads.  Recommendations: Routine ETT done for FU. Patient with some chest discomfort on the treadmill. However, he has had this chronically and relates it to upper body exercises. If he does not do these, he does not get chest discomfort. However, there is significant ST depression with exercise. Will proceed with ETT-Myoview.  Myoview Scan 06-02-2014: Overall Impression: The study is abnormal. This is a low risk scan. There is mild reduction in the ejection fraction. There is a medium-sized scar of moderate severity affecting the anterolateral wall and the mid-inferolateral wall. There is no definite ischemia.  LV Ejection Fraction: 48%. LV Wall Motion: Mild hypokinesis of the lateral wall.  [name]  ASSESSMENT AND PLAN: 1.  CAD, native vessel, without symptoms of angina: The patient is stable on his current medical program. Current medications will be continued.  2. Hyperlipidemia: The patient has been intolerant to all statin drugs, even at the lowest dose. He is  intolerant to ezetimibe. His total cholesterol was elevated in the past and he has been seen in the lipid clinic. He has not been interested in a PCSK9 inhibitor. Will repeat lipids and LFT's. Advised to increase exercise and discussed diet changes today.  3. HTN, essential: likely controlled. No BP meds today is reason for elevated BP reading.  Current medicines are reviewed with the patient today.  The patient does not have concerns regarding medicines.  Labs/ tests ordered today include:   Orders Placed This Encounter  Procedures  . Lipid panel  . Comp Met (CMET)  . EKG 12-Lead    Disposition:   FU one year  Signed, Sherren Mocha, MD  06/18/2015 10:22 AM    [facility] [address], [place], [place]  [address] Phone: [phone]; Fax: [phone]

## 2015-06-18 NOTE — Patient Instructions (Addendum)
Medication Instructions:  Your physician recommends that you continue on your current medications as directed. Please refer to the Current Medication list given to you today.  Restart Aspirin 81mg  once a day.  Labwork: Your physician recommends that you have lab work today: CMP and LIPID  Testing/Procedures: No new orders.   Follow-Up: Your physician wants you to follow-up in: 1 YEAR with Dr Burt Knack.  You will receive a reminder letter in the mail two months in advance. If you don't receive a letter, please call our office to schedule the follow-up appointment.   Any Other Special Instructions Will Be Listed Below (If Applicable).

## 2015-06-21 ENCOUNTER — Other Ambulatory Visit: Payer: Self-pay | Admitting: Cardiovascular Disease

## 2015-07-02 ENCOUNTER — Other Ambulatory Visit: Payer: Self-pay | Admitting: Nurse Practitioner

## 2015-07-02 ENCOUNTER — Other Ambulatory Visit: Payer: Self-pay | Admitting: Cardiovascular Disease

## 2015-08-04 DIAGNOSIS — C8290 Follicular lymphoma, unspecified, unspecified site: Secondary | ICD-10-CM | POA: Diagnosis not present

## 2015-08-04 DIAGNOSIS — Z79899 Other long term (current) drug therapy: Secondary | ICD-10-CM | POA: Diagnosis not present

## 2015-08-18 ENCOUNTER — Telehealth: Payer: Self-pay

## 2015-08-18 NOTE — Telephone Encounter (Signed)
Patient has refused flu vaccine and has made appt to get established with new pcp, dr burns

## 2015-09-17 ENCOUNTER — Encounter: Payer: Self-pay | Admitting: Internal Medicine

## 2015-09-17 ENCOUNTER — Ambulatory Visit (INDEPENDENT_AMBULATORY_CARE_PROVIDER_SITE_OTHER): Payer: Commercial Managed Care - HMO | Admitting: Internal Medicine

## 2015-09-17 VITALS — BP 132/74 | HR 56 | Temp 97.8°F | Resp 16 | Ht 73.0 in | Wt 206.0 lb

## 2015-09-17 DIAGNOSIS — I10 Essential (primary) hypertension: Secondary | ICD-10-CM

## 2015-09-17 DIAGNOSIS — I2510 Atherosclerotic heart disease of native coronary artery without angina pectoris: Secondary | ICD-10-CM | POA: Diagnosis not present

## 2015-09-17 DIAGNOSIS — E785 Hyperlipidemia, unspecified: Secondary | ICD-10-CM | POA: Diagnosis not present

## 2015-09-17 NOTE — Assessment & Plan Note (Signed)
In remission No evidence of recurrence Following with oncology every 6 months

## 2015-09-17 NOTE — Assessment & Plan Note (Signed)
BP Readings from Last 3 Encounters:  09/17/15 132/74  06/18/15 162/80  06/02/14 139/78   Well controlled here today Continue current meds

## 2015-09-17 NOTE — Assessment & Plan Note (Signed)
Does not tolerate statins, zetia Working on improving diet Exercises regularly

## 2015-09-17 NOTE — Assessment & Plan Note (Addendum)
CAD s/p stent On asa and plavix due to extent of disease and inability to tolerate statins Following with cardiology Continue current meds

## 2015-09-17 NOTE — Patient Instructions (Addendum)
  We have reviewed your prior records including labs and tests today.   Medications reviewed and updated.  No changes recommended at this time.   Please followup annually

## 2015-09-17 NOTE — Progress Notes (Signed)
Subjective:    Patient ID: Austin [name], male    DOB: 1946-01-04, 70 y.o.   MRN: [id number]  HPI He is here to establish with a new pcp.    Hypertension,CAD: He had an MI and had a stent placed.  He has extensive CAD and is on both asa and plavix.  He does not tolerate statins. He is taking his medication daily. He is compliant with a low sodium diet.  He denies chest pain, palpitations, edema, shortness of breath and regular headaches. He is exercising regularly.  He does not monitor his blood pressure at home.    Lymphoma:  He has been in remission for the past 18 years.  He follows at [facility] Dr. Royce Macadamia.   Medications and allergies reviewed with patient and updated if appropriate.  Patient Active Problem List   Diagnosis Date Noted  . Coronary artery disease 12/13/2010  . Hyperlipidemia 11/12/2010  . ALLERGIC RHINITIS 11/12/2010  . ELECTROCARDIOGRAM, ABNORMAL 11/10/2010  . ANEMIA, MILD 12/28/2009  . THROMBOCYTOPENIA 12/28/2009  . PSA, INCREASED 12/28/2009  . 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 HEMATURIA 06/22/2009  . Essential hypertension 11/26/2008  . NEPHROLITHIASIS, HX OF 06/26/2007  . LYMPHOMA 01/03/2007    Current Outpatient Prescriptions on File Prior to Visit  Medication Sig Dispense Refill  . amLODipine (NORVASC) 5 MG tablet Take 5 mg by mouth daily.    Marland Kitchen aspirin 81 MG tablet Take 81 mg by mouth daily.    . clopidogrel (PLAVIX) 75 MG tablet TAKE 1 TABLET(75 MG) BY MOUTH DAILY 90 tablet 3  . DORZOLAMIDE HCL OP Take 10 mg by mouth 2 times daily at 12 noon and 4 pm.    . doxazosin (CARDURA) 2 MG tablet Take 1 tablet (2 mg total) by mouth daily. 90 tablet 3  . metoprolol tartrate (LOPRESSOR) 25 MG tablet Take 0.5 tablets (12.5 mg total) by mouth 2 (two) times daily. 90 tablet 3  . timolol (BETIMOL) 0.25 % ophthalmic solution Place into both eyes 2 (two) times daily.     No current facility-administered medications on file prior to  visit.    Past Medical History  Diagnosis Date  . History of nephrolithiasis     Dr. Terance Hart  . 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  . Allergy   . HTN (hypertension)   . Hyperlipidemia     NMR Lipoprofile 2008: LDL 168 ( 2410/ 1826), HDL 41, TG 71. LDL goal = <100, ideally <  70. Framingham Study LDL goal = < 130.  Marland Kitchen Coronary artery disease   . Myocardial infarction ([place])   . Hyperlipemia   . Hx of cardiovascular stress test     ETT-Myoview (10/15): Nondiagnostic EKG changes, anterolateral and inferolateral scar, no ischemia, EF 48%, low risk    Past Surgical History  Procedure Laterality Date  . Esophagela stricture      colonoscopy x2 EGD 2002  . Knee arthroscopy    . Cardiac surgery      Social History   Social History  . Marital Status: Married    Spouse Name: N/A  . Number of Children: N/A  . Years of Education: N/A   Social History Main Topics  . Smoking status: Never Smoker   . Smokeless tobacco: Never Used  . Alcohol Use: No  . Drug Use: No  . Sexual Activity: Not on file   Other Topics Concern  . Not on  file   Social History Narrative    Family History  Problem Relation Age of Onset  . Cancer Mother     lung cancer  . Diabetes Mother   . Hyperlipidemia Mother   . Stroke Father     age 33  . Cancer Sister     ?lymphoma    Review of Systems  Constitutional: Negative for fever and chills.  HENT: Negative for sore throat.   Respiratory: Negative for cough, shortness of breath and wheezing.   Cardiovascular: Positive for chest pain (musculoskeletal pain only). Negative for palpitations and leg swelling.  Gastrointestinal: Negative for nausea and abdominal pain.       No GERD  Genitourinary: Positive for frequency.  Neurological: Negative for dizziness, light-headedness and headaches.       Objective:   Filed Vitals:   09/17/15 0810  BP: 132/74  Pulse: 56  Temp: 97.8 F (36.6 C)  Resp: 16   Filed Weights   09/17/15  0810  Weight: 206 lb (93.441 kg)   Body mass index is 27.18 kg/(m^2).   Physical Exam Constitutional: Appears well-developed and well-nourished. No distress.  Neck: Neck supple. No tracheal deviation present. No thyromegaly present.  No carotid bruit. No cervical adenopathy.   Cardiovascular: Normal rate, regular rhythm and normal heart sounds.   No murmur heard.  No edema Pulmonary/Chest: Effort normal and breath sounds normal. No respiratory distress. No wheezes.       Assessment & Plan:   Deferred vaccines and repeat colonoscopy  See Problem List for Assessment and Plan of chronic medical problems.  Advised him to follow up annually

## 2015-09-17 NOTE — Progress Notes (Signed)
Pre visit review using our clinic review tool, if applicable. No additional management support is needed unless otherwise documented below in the visit note. 

## 2015-10-05 DIAGNOSIS — H16223 Keratoconjunctivitis sicca, not specified as Sjogren's, bilateral: Secondary | ICD-10-CM | POA: Diagnosis not present

## 2015-10-05 DIAGNOSIS — H401123 Primary open-angle glaucoma, left eye, severe stage: Secondary | ICD-10-CM | POA: Diagnosis not present

## 2015-11-16 ENCOUNTER — Other Ambulatory Visit: Payer: Self-pay | Admitting: *Deleted

## 2015-11-16 ENCOUNTER — Other Ambulatory Visit: Payer: Self-pay | Admitting: Cardiovascular Disease

## 2015-11-16 ENCOUNTER — Telehealth: Payer: Self-pay | Admitting: *Deleted

## 2015-11-16 MED ORDER — NITROGLYCERIN 0.4 MG SL SUBL: 0.4000 mg | SUBLINGUAL_TABLET | SUBLINGUAL | Status: DC | PRN

## 2015-11-16 NOTE — Telephone Encounter (Signed)
Patient requests an rx for nitro be sent to [facility]. Ok to order?

## 2015-11-16 NOTE — Telephone Encounter (Signed)
Okay to prescribe Nitroglycerin.

## 2015-11-22 ENCOUNTER — Telehealth: Payer: Self-pay | Admitting: Cardiovascular Disease

## 2015-11-22 DIAGNOSIS — I25119 Atherosclerotic heart disease of native coronary artery with unspecified angina pectoris: Secondary | ICD-10-CM

## 2015-11-22 NOTE — Telephone Encounter (Signed)
Difficult to know based on some typical and atypical features of chest discomfort. Probably best to do stress testing - recommend an exercise stress echo to evaluate his chest pain. thx

## 2015-11-22 NOTE — Telephone Encounter (Signed)
I spoke with the pt and he complains of chest discomfort that comes and goes.  He "self diagnosed" himself with the flu about 2 weeks ago and in the past week the pt has noticed chest discomfort and decreased stamina.  The pt has had to decrease his speed by 2 levels on the treadmill over the past week. He develops discomfort on the treadmill but this resolves the longer the pt exercises.  The pt walks for 20-30 minutes at 2.6 miles/hour.  The pt does have some cough and congestion still from his illness but denies chest discomfort with inspiration and movement. I made the pt aware that his symptoms could be related to his recent illness.  I will forward this message to Dr Burt Knack to review and make further recommendations.

## 2015-11-22 NOTE — Telephone Encounter (Signed)
°  New Prob   Pt c/o of Chest Pain: STAT if CP now or developed within 24 hours  1. Are you having CP right now? No  2. Are you experiencing any other symptoms (ex. SOB, nausea, vomiting, sweating)? No  3. How long have you been experiencing CP? 1 week  4. Is your CP continuous or coming and going? Comes and goes  5. Have you taken Nitroglycerin? No   Pt states he was diagnosed with the Flu 1.5 weeks ago and feels soreness to his chest may be related. ?

## 2015-11-24 NOTE — Telephone Encounter (Signed)
Pt scheduled for test 12/08/15.

## 2015-11-24 NOTE — Telephone Encounter (Signed)
Order placed for exercise stress echo.  I spoke with the pt about this test and he agreed to have test performed. I will have a scheduler contact him with appointment.

## 2015-12-06 ENCOUNTER — Telehealth (HOSPITAL_COMMUNITY): Payer: Self-pay | Admitting: *Deleted

## 2015-12-06 NOTE — Telephone Encounter (Signed)
Patient given detailed instructions per Stress Test Requisition Sheet for test on 12/08/15 at 7:30.Patient Notified to arrive 30 minutes early, and that it is imperative to arrive on time for appointment to keep from having the test rescheduled.  Patient verbalized understanding. Veronia Beets

## 2015-12-08 ENCOUNTER — Ambulatory Visit (HOSPITAL_BASED_OUTPATIENT_CLINIC_OR_DEPARTMENT_OTHER): Payer: Commercial Managed Care - HMO

## 2015-12-08 ENCOUNTER — Ambulatory Visit (HOSPITAL_COMMUNITY): Payer: Commercial Managed Care - HMO | Attending: Cardiovascular Disease

## 2015-12-08 DIAGNOSIS — R079 Chest pain, unspecified: Secondary | ICD-10-CM | POA: Diagnosis present

## 2015-12-08 DIAGNOSIS — R9439 Abnormal result of other cardiovascular function study: Secondary | ICD-10-CM | POA: Diagnosis not present

## 2015-12-08 DIAGNOSIS — R0989 Other specified symptoms and signs involving the circulatory and respiratory systems: Secondary | ICD-10-CM

## 2015-12-08 DIAGNOSIS — I25119 Atherosclerotic heart disease of native coronary artery with unspecified angina pectoris: Secondary | ICD-10-CM | POA: Diagnosis not present

## 2015-12-14 ENCOUNTER — Telehealth: Payer: Self-pay | Admitting: Cardiovascular Disease

## 2015-12-14 NOTE — Telephone Encounter (Signed)
New Message:   Pt would like his stress test results from 12-08-15 please.

## 2015-12-15 NOTE — Telephone Encounter (Signed)
Thanks - agree with plan as outlined.

## 2015-12-15 NOTE — Telephone Encounter (Signed)
I spoke with the pt and made him aware of stress echo results. I have scheduled appointment with Dr Burt Knack on 12/20/15 at 11:30 AM for further evaluation.  At this time the pt is tentatively on the hospital schedule for cardiac catheterization 12/22/15.  The pt is aware that Dr Burt Knack will determine final plan during 12/20/15 appointment.  The pt does continue to have symptoms of chest tightness when he exercises and his symptoms improve the longer he exercises.  I advised him not to exercise and limit exertional activity at this time until he is evaluated further.  Pt agreed with plan.

## 2015-12-20 ENCOUNTER — Ambulatory Visit (INDEPENDENT_AMBULATORY_CARE_PROVIDER_SITE_OTHER): Payer: Commercial Managed Care - HMO | Admitting: Cardiovascular Disease

## 2015-12-20 ENCOUNTER — Encounter: Payer: Self-pay | Admitting: Cardiovascular Disease

## 2015-12-20 VITALS — BP 150/70 | HR 54 | Ht 73.0 in | Wt 206.1 lb

## 2015-12-20 DIAGNOSIS — R9439 Abnormal result of other cardiovascular function study: Secondary | ICD-10-CM | POA: Diagnosis not present

## 2015-12-20 DIAGNOSIS — I25118 Atherosclerotic heart disease of native coronary artery with other forms of angina pectoris: Secondary | ICD-10-CM | POA: Diagnosis not present

## 2015-12-20 LAB — CBC
HCT: 38 % — ABNORMAL LOW (ref 38.5–50.0)
HEMOGLOBIN: 12.9 g/dL — AB (ref 13.2–17.1)
MCH: 30.2 pg (ref 27.0–33.0)
MCHC: 33.9 g/dL (ref 32.0–36.0)
MCV: 89 fL (ref 80.0–100.0)
MPV: 9.4 fL (ref 7.5–12.5)
PLATELETS: 189 10*3/uL (ref 140–400)
RBC: 4.27 MIL/uL (ref 4.20–5.80)
RDW: 13.3 % (ref 11.0–15.0)
WBC: 3.8 10*3/uL (ref 3.8–10.8)

## 2015-12-20 LAB — PROTIME-INR
INR: 1.03 (ref <1.50)
PROTHROMBIN TIME: 13.6 s (ref 11.6–15.2)

## 2015-12-20 LAB — BASIC METABOLIC PANEL
BUN: 13 mg/dL (ref 7–25)
CALCIUM: 9.8 mg/dL (ref 8.6–10.3)
CHLORIDE: 105 mmol/L (ref 98–110)
CO2: 30 mmol/L (ref 20–31)
CREATININE: 0.85 mg/dL (ref 0.70–1.18)
GLUCOSE: 85 mg/dL (ref 65–99)
Potassium: 4.5 mmol/L (ref 3.5–5.3)
Sodium: 140 mmol/L (ref 135–146)

## 2015-12-20 MED ORDER — CLOPIDOGREL BISULFATE 75 MG PO TABS: ORAL_TABLET | ORAL | Status: DC

## 2015-12-20 NOTE — Patient Instructions (Signed)
Medication Instructions:  Your physician recommends that you continue on your current medications as directed. Please refer to the Current Medication list given to you today.  Labwork: Your physician recommends that you have lab work today: BMP, CBC and PT/INR  Testing/Procedures: Your physician has requested that you have a cardiac catheterization. Cardiac catheterization is used to diagnose and/or treat various heart conditions. Doctors may recommend this procedure for a number of different reasons. The most common reason is to evaluate chest pain. Chest pain can be a symptom of coronary artery disease (CAD), and cardiac catheterization can show whether plaque is narrowing or blocking your heart's arteries. This procedure is also used to evaluate the valves, as well as measure the blood flow and oxygen levels in different parts of your heart. For further information please visit [email or web address]. Please follow instruction sheet, as given.  Follow-Up: Your physician recommends that you schedule a follow-up appointment in: 2-3 WEEKS with PA/NP    Any Other Special Instructions Will Be Listed Below (If Applicable).     If you need a refill on your cardiac medications before your next appointment, please call your pharmacy.

## 2015-12-20 NOTE — Progress Notes (Signed)
Cardiology Office Note Date:  12/20/2015   ID:  Austin [name], DOB 1946/02/04, MRN [id number]  PCP:  Binnie Rail, MD  Cardiologist:  Sherren Mocha, MD    Chief Complaint  Patient presents with  . Hypertension  . Coronary Artery Disease    f/u after abnormal stress echo     History of Present Illness: Austin [name] is a 70 y.o. male who presents for Evaluation of chest pain. The patient has a history of coronary artery disease. He had a lateral wall MI in 2012 and at that time underwent PCI of the left circumflex with a bare-metal stent. He was noted to have residual disease in the diagonal branches of the LAD and the right PDA. He has been treated medically since that time. He's done well until the last 4 weeks. The patient had a viral illness and since he has started back to his exercise program, he has been limited by chest pain and shortness of breath. He describes fairly marked exercise limitation which is new for him. He complains of a pressure-like sensation in his upper chest that resolves with rest. He also complains of increasing fatigue and shortness of breath with activity. At rest he has no symptoms. He even has had exertional dyspnea and chest pain climbing stairs with 1-2 flights.  Because of these symptoms, he underwent an exercise echocardiogram. The results are outlined below, but in summary there was inducible ischemia found in the inferolateral, lateral, and anteroseptal walls. The patient did have reproducible angina with exercise and 1 mm of ST segment depression as well. He is here today to review results and discuss further evaluation and treatment. He denies lightheadedness, syncope, orthopnea, PND, or resting symptoms.   Past Medical History  Diagnosis Date  . History of nephrolithiasis     Dr. Terance Hart  . 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  [facility]   . HTN (hypertension)   . Hyperlipidemia     NMR Lipoprofile 2008: LDL 168 (  2410/ 1826), HDL 41, TG 71. LDL goal = <100, ideally <  70. Framingham Study LDL goal = < 130.  Marland Kitchen Coronary artery disease   . Myocardial infarction ([place])   . Hyperlipemia   . Hx of cardiovascular stress test     ETT-Myoview (10/15): Nondiagnostic EKG changes, anterolateral and inferolateral scar, no ischemia, EF 48%, low risk    Past Surgical History  Procedure Laterality Date  . Esophagela stricture      colonoscopy x2 EGD 2002  . Knee arthroscopy    . Cardiac surgery      Current Outpatient Prescriptions  Medication Sig Dispense Refill  . amLODipine (NORVASC) 5 MG tablet Take 5 mg by mouth daily.    Marland Kitchen aspirin 81 MG tablet Take 81 mg by mouth daily.    . clopidogrel (PLAVIX) 75 MG tablet TAKE 1 TABLET(75 MG) BY MOUTH DAILY 90 tablet 3  . dorzolamide-timolol (COSOPT) 22.3-6.8 MG/ML ophthalmic solution Place 1 drop into both eyes 2 (two) times daily.    Marland Kitchen doxazosin (CARDURA) 2 MG tablet Take 1 tablet (2 mg total) by mouth daily. 90 tablet 3  . Menthol-Methyl Salicylate (MUSCLE RUB EX) Apply 1 application topically daily as needed. Muscle pain    . metoprolol tartrate (LOPRESSOR) 25 MG tablet Take 0.5 tablets (12.5 mg total) by mouth 2 (two) times daily. 90 tablet 3  . nitroGLYCERIN (NITROSTAT) 0.4 MG SL tablet PLACE 1 TABLET UNDER THE TONGUE  EVERY 5 MINUTES AS NEEDED FOR CHEST PAIN 75 tablet 1  . timolol (BETIMOL) 0.25 % ophthalmic solution Place 1 drop into both eyes 2 (two) times daily.      No current facility-administered medications for this visit.    Allergies:   Zetia; Crestor; and Pravachol   Social History:  The patient  reports that he has never smoked. He has never used smokeless tobacco. He reports that he does not drink alcohol or use illicit drugs.   Family History:  The patient's  family history includes Cancer in his mother and sister; Diabetes in his mother; Hyperlipidemia in his mother; Stroke in his father.    ROS:  Please see the history of present illness.   Otherwise, review of systems is positive for Chest pain, abdominal pain, muscle pain, fatigue, chest pressure.  All other systems are reviewed and negative.    PHYSICAL EXAM: VS:  BP 150/70 mmHg  Pulse 54  Ht 6\' 1"  (1.854 m)  Wt 206 lb 1.9 oz (93.495 kg)  BMI 27.20 kg/m2 , BMI Body mass index is 27.2 kg/(m^2). GEN: Well nourished, well developed, in no acute distress HEENT: normal Neck: no JVD, no masses. No carotid bruits Cardiac: RRR without murmur or gallop                Respiratory:  clear to auscultation bilaterally, normal work of breathing GI: soft, nontender, nondistended, + BS MS: no deformity or atrophy Ext: no pretibial edema, pedal pulses 2+= bilaterally Skin: warm and dry, no rash Neuro:  Strength and sensation are intact Psych: euthymic mood, full affect  EKG:  EKG is ordered today. The ekg ordered today shows sinus bradycardia 55 bpm, inferolateral infarct age undetermined, T-wave abnormality consider lateral ischemia.  Recent Labs: 06/18/2015: ALT 14; BUN 13; Creat 0.94; Potassium 4.1; Sodium 139   Lipid Panel     Component Value Date/Time   CHOL 230* 06/18/2015 0946   TRIG 95 06/18/2015 0946   HDL 39* 06/18/2015 0946   CHOLHDL 5.9* 06/18/2015 0946   VLDL 19 06/18/2015 0946   LDLCALC 172* 06/18/2015 0946   LDLDIRECT 173.2 07/21/2013 0809      Wt Readings from Last 3 Encounters:  12/20/15 206 lb 1.9 oz (93.495 kg)  09/17/15 206 lb (93.441 kg)  06/18/15 206 lb 6.4 oz (93.622 kg)     Cardiac Studies Reviewed: Exercise Echo: Study Conclusions  - Stress ECG conclusions: There were no stress arrhythmias or  conduction abnormalities. The stress ECG was consistent with  myocardial ischemia. Duke scoring: exercise time of 9.5 min;  maximum ST deviation of 1 mm; angina present but did not limit  exercise; resulting score is 1. This score predicts a moderate  risk of cardiac events.  Impressions:  - Abnormal study after maximal exercise with  reproduction of  symptoms. Study suggests myocardial ischemia in the inferolateral  and anteroseptal walls.  Baseline:  - The estimated LV ejection fraction was 55-60%. - Hypokinesis of the mid-apicallateral LV myocardium.  Peak stress:  - The estimated LV ejection fraction was 55%. - Akinesis of the entirelateral LV myocardium. - Akinesis of the entireinferolateral LV myocardium. - Akinesis of the basal-midanteroseptal LV myocardium.  Cardiac Cath 12-03-2010: ANGIOGRAPHIC DATA: 1. The right coronary artery demonstrates about 40% area proximally.  There is some diffuse luminal irregularities. The PDA has at least  2 lesions of 80%, this is a small-caliber vessel. The continuation  branch has about 70% leading into the posterolateral segment. Both  posterolateral vessels were moderate in size. Of note, the  proximal PDA also has about 50% proximal narrowing as well. 2. The left main is free of disease. 3. The LAD has about 60% area of eccentric plaquing proximally. There  is then a large diagonal which has 2 branches, superior branch with  95% narrowing and inferior branch was 70%. The superior branch is  somewhat smaller in caliber being just a 1.5-mm artery and the  inferior branch has about 70% segmental narrowing. 4. The circumflex coronary artery demonstrates a marginal with 50%  narrowing and then slow flow. There is 95% narrowing with TIMI-1  flow and the distal vessel was not well seen. Following  reperfusion, this is opened up and there are 2 sub-branches  distally. The superior subbranch has some mild clotting. In the  proximal vessel and distally, there is extensive clot which was  treated with thrombectomy. This area was then dilated and there  were 2 fairly high-grade lesions of 70-80%. After dilatation, the  proximal circumflex lesion was stented with a 0% residual narrowing  and the distal  circumflex lesions had about 30% narrowing in both  lesions with evidence of some residual disruption in the more  proximal of 2 lesions. There is also a very proximal marginal  lesion with about 50% narrowing, but we elected not to stent this  in the current environment. The AV circumflex has a second  marginal with about 50-60% narrowing as well. 5. Ventriculography. Ventriculography in the RAO projection reveals  overall ejection fraction of around 40-45% in the anterolateral  segment and apical segment is severely hypokinetic. The rest of  the ventricle moves well. No significant mitral regurgitation is  noted.  CONCLUSIONS: 1. Acute lateral wall infarction secondary to occlusion of the  circumflex with successful percutaneous stenting with a non drug-  eluting stent and dilatation of heavily clotted vessel distally. 2. Residual high-grade disease involving the diagonals. 3. Diffuse disease of the posterior descending branch as noted above. 4. Scattered lesions as noted in the report.  DISPOSITION: At the present time given his other problems, aggressive medical therapy will be recommended unless he has recurrent ischemic pain. We will review the films regarding his diagonals with my colleagues.   ASSESSMENT AND PLAN: 1.  CAD, native vessel, with CCS class III angina: The patient has known CAD. He has been maintained on long-term dual antiplatelet therapy with aspirin and Plavix. He now has developed class III anginal symptoms with multi-territory ischemia by stress echocardiography. We discussed these findings as an indication for cardiac catheterization and possible PCI. I have reviewed the risks, indications, and alternatives to cardiac catheterization, possible angioplasty, and stenting with the patient and his wife who is with him today.  Risks include but are not limited to bleeding, infection, vascular injury, stroke, myocardial  infection, arrhythmia, kidney injury, radiation-related injury in the case of prolonged fluoroscopy use, emergency cardiac surgery, and death. The patient understands the risks of serious complication is 1-2 in 123XX123 with diagnostic cardiac cath and 1-2% or less with angioplasty/stenting. The patient is on 2 antianginal drugs with metoprolol and amlodipine. He will continue on aspirin and Plavix. Other medications will be continued without change until his cardiac catheterization procedure. Anticipate a right radial approach for vascular access.  2. Essential hypertension: Continue amlodipine and metoprolol. Consider the addition of an ACE or diuretic if blood pressure remains elevated.  3. Hyperlipidemia: The patient is statin intolerant and reports allergies to ezetimibe, Crestor, and pravastatin.  Will need to consider a PCSK9 inhibitor.   4. Follicular lymphoma: reviewed notes from [facility] He is stable and blood counts have been stable as well. No contraindication to cath/PCI.  Current medicines are reviewed with the patient today.  The patient does not have concerns regarding medicines.  Labs/ tests ordered today include:   Orders Placed This Encounter  Procedures  . Basic Metabolic Panel (BMET)  . CBC  . INR/PT  . EKG 12-Lead    Disposition:   FU after cath  Signed, Sherren Mocha, MD  12/20/2015 1:20 PM    [facility] [place], [place], [place]  [address] Phone: [phone]; Fax: [phone]

## 2015-12-22 ENCOUNTER — Ambulatory Visit (HOSPITAL_COMMUNITY)
Admission: RE | Admit: 2015-12-22 | Discharge: 2015-12-23 | Disposition: A | Discharge status: Home / Self Care | Payer: Commercial Managed Care - HMO | Source: Ambulatory Visit | Attending: Cardiovascular Disease | Admitting: Cardiovascular Disease

## 2015-12-22 ENCOUNTER — Encounter (HOSPITAL_COMMUNITY): Payer: Self-pay | Admitting: General Practice

## 2015-12-22 ENCOUNTER — Encounter (HOSPITAL_COMMUNITY)
Admission: RE | Disposition: A | Discharge status: Home / Self Care | Payer: Self-pay | Source: Ambulatory Visit | Attending: Cardiovascular Disease

## 2015-12-22 DIAGNOSIS — I10 Essential (primary) hypertension: Secondary | ICD-10-CM | POA: Diagnosis present

## 2015-12-22 DIAGNOSIS — Z7982 Long term (current) use of aspirin: Secondary | ICD-10-CM | POA: Diagnosis not present

## 2015-12-22 DIAGNOSIS — I25118 Atherosclerotic heart disease of native coronary artery with other forms of angina pectoris: Secondary | ICD-10-CM | POA: Diagnosis not present

## 2015-12-22 DIAGNOSIS — I252 Old myocardial infarction: Secondary | ICD-10-CM | POA: Insufficient documentation

## 2015-12-22 DIAGNOSIS — Z7902 Long term (current) use of antithrombotics/antiplatelets: Secondary | ICD-10-CM | POA: Insufficient documentation

## 2015-12-22 DIAGNOSIS — Z955 Presence of coronary angioplasty implant and graft: Secondary | ICD-10-CM | POA: Diagnosis not present

## 2015-12-22 DIAGNOSIS — I2089 Other forms of angina pectoris: Secondary | ICD-10-CM | POA: Diagnosis present

## 2015-12-22 DIAGNOSIS — E785 Hyperlipidemia, unspecified: Secondary | ICD-10-CM | POA: Insufficient documentation

## 2015-12-22 DIAGNOSIS — C8290 Follicular lymphoma, unspecified, unspecified site: Secondary | ICD-10-CM | POA: Insufficient documentation

## 2015-12-22 DIAGNOSIS — E782 Mixed hyperlipidemia: Secondary | ICD-10-CM | POA: Diagnosis present

## 2015-12-22 DIAGNOSIS — I208 Other forms of angina pectoris: Secondary | ICD-10-CM | POA: Diagnosis present

## 2015-12-22 HISTORY — PX: CARDIAC CATHETERIZATION: SHX172

## 2015-12-22 HISTORY — DX: Transient cerebral ischemic attack, unspecified: G45.9

## 2015-12-22 LAB — POCT ACTIVATED CLOTTING TIME
ACTIVATED CLOTTING TIME: 250 s
ACTIVATED CLOTTING TIME: 260 s

## 2015-12-22 SURGERY — LEFT HEART CATH AND CORONARY ANGIOGRAPHY
Anesthesia: LOCAL

## 2015-12-22 MED ORDER — ADENOSINE 12 MG/4ML IV SOLN
16.0000 mL | INTRAVENOUS | Status: AC
  Administered 2015-12-22: 48 mg via INTRAVENOUS
  Administered 2015-12-22: 140 mg via INTRAVENOUS
  Filled 2015-12-22: qty 16

## 2015-12-22 MED ORDER — FENTANYL CITRATE (PF) 100 MCG/2ML IJ SOLN
INTRAMUSCULAR | Status: DC | PRN
  Administered 2015-12-22: 25 ug via INTRAVENOUS

## 2015-12-22 MED ORDER — IOPAMIDOL (ISOVUE-370) INJECTION 76%
INTRAVENOUS | Status: DC | PRN
  Administered 2015-12-22: 82 mg via INTRAVENOUS

## 2015-12-22 MED ORDER — SODIUM CHLORIDE 0.9 % IV SOLN: INTRAVENOUS | Status: AC

## 2015-12-22 MED ORDER — ASPIRIN EC 81 MG PO TBEC
81.0000 mg | DELAYED_RELEASE_TABLET | Freq: Every day | ORAL | Status: DC
  Administered 2015-12-23: 11:00:00 81 mg via ORAL
  Filled 2015-12-22: qty 1

## 2015-12-22 MED ORDER — IOPAMIDOL (ISOVUE-370) INJECTION 76%
INTRAVENOUS | Status: AC
  Filled 2015-12-22: qty 100

## 2015-12-22 MED ORDER — NITROGLYCERIN 1 MG/10 ML FOR IR/CATH LAB
INTRA_ARTERIAL | Status: DC | PRN
  Administered 2015-12-22: 200 ug via INTRACORONARY

## 2015-12-22 MED ORDER — ONDANSETRON HCL 4 MG/2ML IJ SOLN: 4.0000 mg | Freq: Four times a day (QID) | INTRAMUSCULAR | Status: DC | PRN

## 2015-12-22 MED ORDER — METOPROLOL TARTRATE 12.5 MG HALF TABLET
12.5000 mg | ORAL_TABLET | Freq: Two times a day (BID) | ORAL | Status: DC
  Administered 2015-12-22 – 2015-12-23 (×2): 12.5 mg via ORAL
  Filled 2015-12-22 (×2): qty 1

## 2015-12-22 MED ORDER — ACETAMINOPHEN 325 MG PO TABS: 650.0000 mg | ORAL_TABLET | ORAL | Status: DC | PRN

## 2015-12-22 MED ORDER — VERAPAMIL HCL 2.5 MG/ML IV SOLN
INTRAVENOUS | Status: DC | PRN
  Administered 2015-12-22: 10 mL via INTRA_ARTERIAL

## 2015-12-22 MED ORDER — HEPARIN (PORCINE) IN NACL 2-0.9 UNIT/ML-% IJ SOLN
INTRAMUSCULAR | Status: DC | PRN
Start: 2015-12-22 — End: 2015-12-22
  Administered 2015-12-22: 1000 mL

## 2015-12-22 MED ORDER — SODIUM CHLORIDE 0.9 % WEIGHT BASED INFUSION: 1.0000 mL/kg/h | INTRAVENOUS | Status: DC

## 2015-12-22 MED ORDER — SODIUM CHLORIDE 0.9% FLUSH: 3.0000 mL | Freq: Two times a day (BID) | INTRAVENOUS | Status: DC

## 2015-12-22 MED ORDER — VERAPAMIL HCL 2.5 MG/ML IV SOLN
INTRAVENOUS | Status: AC
  Filled 2015-12-22: qty 2

## 2015-12-22 MED ORDER — SODIUM CHLORIDE 0.9 % IV SOLN: 250.0000 mL | INTRAVENOUS | Status: DC | PRN

## 2015-12-22 MED ORDER — SODIUM CHLORIDE 0.9 % WEIGHT BASED INFUSION
3.0000 mL/kg/h | INTRAVENOUS | Status: DC
  Administered 2015-12-22: 3 mL/kg/h via INTRAVENOUS

## 2015-12-22 MED ORDER — FENTANYL CITRATE (PF) 100 MCG/2ML IJ SOLN
INTRAMUSCULAR | Status: AC
  Filled 2015-12-22: qty 2

## 2015-12-22 MED ORDER — HEPARIN SODIUM (PORCINE) 1000 UNIT/ML IJ SOLN
INTRAMUSCULAR | Status: AC
Start: 2015-12-22 — End: 2015-12-22
  Filled 2015-12-22: qty 1

## 2015-12-22 MED ORDER — HEPARIN SODIUM (PORCINE) 1000 UNIT/ML IJ SOLN
INTRAMUSCULAR | Status: DC | PRN
  Administered 2015-12-22: 7000 [IU] via INTRAVENOUS
  Administered 2015-12-22: 3000 [IU] via INTRAVENOUS

## 2015-12-22 MED ORDER — SODIUM CHLORIDE 0.9% FLUSH: 3.0000 mL | INTRAVENOUS | Status: DC | PRN

## 2015-12-22 MED ORDER — LIDOCAINE HCL (PF) 1 % IJ SOLN
INTRAMUSCULAR | Status: AC
  Filled 2015-12-22: qty 30

## 2015-12-22 MED ORDER — NITROGLYCERIN 1 MG/10 ML FOR IR/CATH LAB
INTRA_ARTERIAL | Status: AC
  Filled 2015-12-22: qty 10

## 2015-12-22 MED ORDER — ANGIOPLASTY BOOK
Freq: Once | Status: AC
  Administered 2015-12-22: 21:00:00
  Filled 2015-12-22: qty 1

## 2015-12-22 MED ORDER — AMLODIPINE BESYLATE 5 MG PO TABS
5.0000 mg | ORAL_TABLET | Freq: Every day | ORAL | Status: DC
  Administered 2015-12-23: 5 mg via ORAL
  Filled 2015-12-22: qty 1

## 2015-12-22 MED ORDER — CLOPIDOGREL BISULFATE 75 MG PO TABS: 75.0000 mg | ORAL_TABLET | Freq: Once | ORAL | Status: AC

## 2015-12-22 MED ORDER — OXYCODONE-ACETAMINOPHEN 5-325 MG PO TABS: 1.0000 | ORAL_TABLET | ORAL | Status: DC | PRN

## 2015-12-22 MED ORDER — MIDAZOLAM HCL 2 MG/2ML IJ SOLN
INTRAMUSCULAR | Status: AC
  Filled 2015-12-22: qty 2

## 2015-12-22 MED ORDER — CLOPIDOGREL BISULFATE 75 MG PO TABS
75.0000 mg | ORAL_TABLET | Freq: Every day | ORAL | Status: DC
  Administered 2015-12-23: 11:00:00 75 mg via ORAL
  Filled 2015-12-22: qty 1

## 2015-12-22 MED ORDER — ASPIRIN 81 MG PO CHEW: 81.0000 mg | CHEWABLE_TABLET | ORAL | Status: AC

## 2015-12-22 MED ORDER — DORZOLAMIDE HCL-TIMOLOL MAL 2-0.5 % OP SOLN
1.0000 [drp] | Freq: Two times a day (BID) | OPHTHALMIC | Status: DC
  Administered 2015-12-22: 1 [drp] via OPHTHALMIC
  Filled 2015-12-22: qty 10

## 2015-12-22 MED ORDER — HEPARIN (PORCINE) IN NACL 2-0.9 UNIT/ML-% IJ SOLN
INTRAMUSCULAR | Status: AC
  Filled 2015-12-22: qty 1000

## 2015-12-22 MED ORDER — DOXAZOSIN MESYLATE 2 MG PO TABS
2.0000 mg | ORAL_TABLET | Freq: Every day | ORAL | Status: DC
  Administered 2015-12-22 – 2015-12-23 (×2): 2 mg via ORAL
  Filled 2015-12-22 (×2): qty 1

## 2015-12-22 MED ORDER — CLOPIDOGREL BISULFATE 75 MG PO TABS: 75.0000 mg | ORAL_TABLET | Freq: Once | ORAL | Status: DC

## 2015-12-22 MED ORDER — MIDAZOLAM HCL 2 MG/2ML IJ SOLN
INTRAMUSCULAR | Status: DC | PRN
  Administered 2015-12-22: 2 mg via INTRAVENOUS

## 2015-12-22 MED ORDER — LIDOCAINE HCL (PF) 1 % IJ SOLN
INTRAMUSCULAR | Status: DC | PRN
  Administered 2015-12-22: 2 mL

## 2015-12-22 SURGICAL SUPPLY — 18 items
BALLN EUPHORA RX 2.5X15 (BALLOONS) ×2
BALLOON EUPHORA RX 2.5X15 (BALLOONS) ×1 IMPLANT
CATH INFINITI 5 FR JL3.5 (CATHETERS) ×2 IMPLANT
CATH INFINITI 5FR ANG PIGTAIL (CATHETERS) ×2 IMPLANT
CATH INFINITI JR4 5F (CATHETERS) ×2 IMPLANT
CATH MICROCATH NAVVUS (MICROCATHETER) ×2 IMPLANT
CATH VISTA GUIDE 6FR XBLAD3.5 (CATHETERS) ×2 IMPLANT
GLIDESHEATH SLEND SS 6F .021 (SHEATH) ×2 IMPLANT
KIT ENCORE 26 ADVANTAGE (KITS) ×2 IMPLANT
KIT HEART LEFT (KITS) ×2 IMPLANT
MICROCATHETER NAVVUS (MICROCATHETER) ×4
NEEDLE PERC 21GX4CM (NEEDLE) ×2 IMPLANT
PACK CARDIAC CATHETERIZATION (CUSTOM PROCEDURE TRAY) ×2 IMPLANT
STENT RESOLUTE INTEG 4.0X15 (Permanent Stent) ×2 IMPLANT
TRANSDUCER W/STOPCOCK (MISCELLANEOUS) ×2 IMPLANT
TUBING CIL FLEX 10 FLL-RA (TUBING) ×2 IMPLANT
WIRE COUGAR XT STRL 190CM (WIRE) ×2 IMPLANT
WIRE SAFE-T 1.5MM-J .035X260CM (WIRE) ×2 IMPLANT

## 2015-12-22 NOTE — Interval H&P Note (Signed)
Cath Lab Visit (complete for each Cath Lab visit)  Clinical Evaluation Leading to the Procedure:   ACS: No.  Non-ACS:    Anginal Classification: CCS III  Anti-ischemic medical therapy: Maximal Therapy (2 or more classes of medications)  Non-Invasive Test Results: Intermediate-risk stress test findings: cardiac mortality 1-3%/year  Prior CABG: No previous CABG      History and Physical Interval Note:  12/22/2015 10:01 AM  Austin [name]  has presented today for surgery, with the diagnosis of cp, abnormal stress echo  The various methods of treatment have been discussed with the patient and family. After consideration of risks, benefits and other options for treatment, the patient has consented to  Procedure(s): Left Heart Cath and Coronary Angiography (N/A) as a surgical intervention .  The patient's history has been reviewed, patient examined, no change in status, stable for surgery.  I have reviewed the patient's chart and labs.  Questions were answered to the patient's satisfaction.     Sherren Mocha

## 2015-12-22 NOTE — Op Note (Signed)
Brief op note:  See full cardiac catheterization note to follow. Patient found to have severe stenosis of the proximal LAD, treated with coronary stenting. There was moderate stenosis of the first obtuse marginal branch with a negative pressure wire. He tolerated the procedure well. Procedure performed via right radial access. Minimal blood loss.  Sherren Mocha 12/22/2015 11:43 AM

## 2015-12-22 NOTE — H&P (View-Only) (Signed)
Cardiology Office Note Date:  12/20/2015   ID:  Austin [name], DOB October 06, 1945, MRN [id number]  PCP:  Austin Rail, MD  Cardiologist:  Austin Mocha, MD    Chief Complaint  Patient presents with  . Hypertension  . Coronary Artery Disease    f/u after abnormal stress echo     History of Present Illness: Austin [name] is a 70 y.o. male who presents for Evaluation of chest pain. The patient has a history of coronary artery disease. He had a lateral wall MI in 2012 and at that time underwent PCI of the left circumflex with a bare-metal stent. He was noted to have residual disease in the diagonal branches of the LAD and the right PDA. He has been treated medically since that time. He's done well until the last 4 weeks. The patient had a viral illness and since he has started back to his exercise program, he has been limited by chest pain and shortness of breath. He describes fairly marked exercise limitation which is new for him. He complains of a pressure-like sensation in his upper chest that resolves with rest. He also complains of increasing fatigue and shortness of breath with activity. At rest he has no symptoms. He even has had exertional dyspnea and chest pain climbing stairs with 1-2 flights.  Because of these symptoms, he underwent an exercise echocardiogram. The results are outlined below, but in summary there was inducible ischemia found in the inferolateral, lateral, and anteroseptal walls. The patient did have reproducible angina with exercise and 1 mm of ST segment depression as well. He is here today to review results and discuss further evaluation and treatment. He denies lightheadedness, syncope, orthopnea, PND, or resting symptoms.   Past Medical History  Diagnosis Date  . History of nephrolithiasis     Dr. Terance [name]  . 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  . [facility]   . HTN (hypertension)   . Hyperlipidemia     NMR Lipoprofile 2008: LDL 168 (  2410/ 1826), HDL 41, TG 71. LDL goal = <100, ideally <  70. Framingham Study LDL goal = < 130.  Marland Kitchen Coronary artery disease   . Myocardial infarction ([place])   . Hyperlipemia   . Hx of cardiovascular stress test     ETT-Myoview (10/15): Nondiagnostic EKG changes, anterolateral and inferolateral scar, no ischemia, EF 48%, low risk    Past Surgical History  Procedure Laterality Date  . Esophagela stricture      colonoscopy x2 EGD 2002  . Knee arthroscopy    . Cardiac surgery      Current Outpatient Prescriptions  Medication Sig Dispense Refill  . amLODipine (NORVASC) 5 MG tablet Take 5 mg by mouth daily.    Marland Kitchen aspirin 81 MG tablet Take 81 mg by mouth daily.    . clopidogrel (PLAVIX) 75 MG tablet TAKE 1 TABLET(75 MG) BY MOUTH DAILY 90 tablet 3  . dorzolamide-timolol (COSOPT) 22.3-6.8 MG/ML ophthalmic solution Place 1 drop into both eyes 2 (two) times daily.    Marland Kitchen doxazosin (CARDURA) 2 MG tablet Take 1 tablet (2 mg total) by mouth daily. 90 tablet 3  . Menthol-Methyl Salicylate (MUSCLE RUB EX) Apply 1 application topically daily as needed. Muscle pain    . metoprolol tartrate (LOPRESSOR) 25 MG tablet Take 0.5 tablets (12.5 mg total) by mouth 2 (two) times daily. 90 tablet 3  . nitroGLYCERIN (NITROSTAT) 0.4 MG SL tablet PLACE 1 TABLET UNDER THE TONGUE  EVERY 5 MINUTES AS NEEDED FOR CHEST PAIN 75 tablet 1  . timolol (BETIMOL) 0.25 % ophthalmic solution Place 1 drop into both eyes 2 (two) times daily.      No current facility-administered medications for this visit.    Allergies:   Zetia; Crestor; and Pravachol   Social History:  The patient  reports that he has never smoked. He has never used smokeless tobacco. He reports that he does not drink alcohol or use illicit drugs.   Family History:  The patient's  family history includes Cancer in his mother and sister; Diabetes in his mother; Hyperlipidemia in his mother; Stroke in his father.    ROS:  Please see the history of present illness.   Otherwise, review of systems is positive for Chest pain, abdominal pain, muscle pain, fatigue, chest pressure.  All other systems are reviewed and negative.    PHYSICAL EXAM: VS:  BP 150/70 mmHg  Pulse 54  Ht 6\' 1"  (1.854 m)  Wt 206 lb 1.9 oz (93.495 kg)  BMI 27.20 kg/m2 , BMI Body mass index is 27.2 kg/(m^2). GEN: Well nourished, well developed, in no acute distress HEENT: normal Neck: no JVD, no masses. No carotid bruits Cardiac: RRR without murmur or gallop                Respiratory:  clear to auscultation bilaterally, normal work of breathing GI: soft, nontender, nondistended, + BS MS: no deformity or atrophy Ext: no pretibial edema, pedal pulses 2+= bilaterally Skin: warm and dry, no rash Neuro:  Strength and sensation are intact Psych: euthymic mood, full affect  EKG:  EKG is ordered today. The ekg ordered today shows sinus bradycardia 55 bpm, inferolateral infarct age undetermined, T-wave abnormality consider lateral ischemia.  Recent Labs: 06/18/2015: ALT 14; BUN 13; Creat 0.94; Potassium 4.1; Sodium 139   Lipid Panel     Component Value Date/Time   CHOL 230* 06/18/2015 0946   TRIG 95 06/18/2015 0946   HDL 39* 06/18/2015 0946   CHOLHDL 5.9* 06/18/2015 0946   VLDL 19 06/18/2015 0946   LDLCALC 172* 06/18/2015 0946   LDLDIRECT 173.2 07/21/2013 0809      Wt Readings from Last 3 Encounters:  12/20/15 206 lb 1.9 oz (93.495 kg)  09/17/15 206 lb (93.441 kg)  06/18/15 206 lb 6.4 oz (93.622 kg)     Cardiac Studies Reviewed: Exercise Echo: Study Conclusions  - Stress ECG conclusions: There were no stress arrhythmias or  conduction abnormalities. The stress ECG was consistent with  myocardial ischemia. Duke scoring: exercise time of 9.5 min;  maximum ST deviation of 1 mm; angina present but did not limit  exercise; resulting score is 1. This score predicts a moderate  risk of cardiac events.  Impressions:  - Abnormal study after maximal exercise with  reproduction of  symptoms. Study suggests myocardial ischemia in the inferolateral  and anteroseptal walls.  Baseline:  - The estimated LV ejection fraction was 55-60%. - Hypokinesis of the mid-apicallateral LV myocardium.  Peak stress:  - The estimated LV ejection fraction was 55%. - Akinesis of the entirelateral LV myocardium. - Akinesis of the entireinferolateral LV myocardium. - Akinesis of the basal-midanteroseptal LV myocardium.  Cardiac Cath 12-03-2010: ANGIOGRAPHIC DATA: 1. The right coronary artery demonstrates about 40% area proximally.  There is some diffuse luminal irregularities. The PDA has at least  2 lesions of 80%, this is a small-caliber vessel. The continuation  branch has about 70% leading into the posterolateral segment. Both  posterolateral vessels were moderate in size. Of note, the  proximal PDA also has about 50% proximal narrowing as well. 2. The left main is free of disease. 3. The LAD has about 60% area of eccentric plaquing proximally. There  is then a large diagonal which has 2 branches, superior branch with  95% narrowing and inferior branch was 70%. The superior branch is  somewhat smaller in caliber being just a 1.5-mm artery and the  inferior branch has about 70% segmental narrowing. 4. The circumflex coronary artery demonstrates a marginal with 50%  narrowing and then slow flow. There is 95% narrowing with TIMI-1  flow and the distal vessel was not well seen. Following  reperfusion, this is opened up and there are 2 sub-branches  distally. The superior subbranch has some mild clotting. In the  proximal vessel and distally, there is extensive clot which was  treated with thrombectomy. This area was then dilated and there  were 2 fairly high-grade lesions of 70-80%. After dilatation, the  proximal circumflex lesion was stented with a 0% residual narrowing  and the distal  circumflex lesions had about 30% narrowing in both  lesions with evidence of some residual disruption in the more  proximal of 2 lesions. There is also a very proximal marginal  lesion with about 50% narrowing, but we elected not to stent this  in the current environment. The AV circumflex has a second  marginal with about 50-60% narrowing as well. 5. Ventriculography. Ventriculography in the RAO projection reveals  overall ejection fraction of around 40-45% in the anterolateral  segment and apical segment is severely hypokinetic. The rest of  the ventricle moves well. No significant mitral regurgitation is  noted.  CONCLUSIONS: 1. Acute lateral wall infarction secondary to occlusion of the  circumflex with successful percutaneous stenting with a non drug-  eluting stent and dilatation of heavily clotted vessel distally. 2. Residual high-grade disease involving the diagonals. 3. Diffuse disease of the posterior descending branch as noted above. 4. Scattered lesions as noted in the report.  DISPOSITION: At the present time given his other problems, aggressive medical therapy will be recommended unless he has recurrent ischemic pain. We will review the films regarding his diagonals with my colleagues.   ASSESSMENT AND PLAN: 1.  CAD, native vessel, with CCS class III angina: The patient has known CAD. He has been maintained on long-term dual antiplatelet therapy with aspirin and Plavix. He now has developed class III anginal symptoms with multi-territory ischemia by stress echocardiography. We discussed these findings as an indication for cardiac catheterization and possible PCI. I have reviewed the risks, indications, and alternatives to cardiac catheterization, possible angioplasty, and stenting with the patient and his wife who is with him today.  Risks include but are not limited to bleeding, infection, vascular injury, stroke, myocardial  infection, arrhythmia, kidney injury, radiation-related injury in the case of prolonged fluoroscopy use, emergency cardiac surgery, and death. The patient understands the risks of serious complication is 1-2 in 123XX123 with diagnostic cardiac cath and 1-2% or less with angioplasty/stenting. The patient is on 2 antianginal drugs with metoprolol and amlodipine. He will continue on aspirin and Plavix. Other medications will be continued without change until his cardiac catheterization procedure. Anticipate a right radial approach for vascular access.  2. Essential hypertension: Continue amlodipine and metoprolol. Consider the addition of an ACE or diuretic if blood pressure remains elevated.  3. Hyperlipidemia: The patient is statin intolerant and reports allergies to ezetimibe, Crestor, and pravastatin.  Will need to consider a PCSK9 inhibitor.   4. Follicular lymphoma: reviewed [facility] from [facility]. He is stable and blood counts have been stable as well. No contraindication to cath/PCI.  Current medicines are reviewed with the patient today.  The patient does not have concerns regarding medicines.  Labs/ tests ordered today include:   Orders Placed This Encounter  Procedures  . Basic Metabolic Panel (BMET)  . CBC  . INR/PT  . EKG 12-Lead    Disposition:   FU after cath  Signed, Austin Mocha, MD  12/20/2015 1:20 PM    [facility] [place], [place], [place]  [address] Phone: [phone]; Fax: [phone]

## 2015-12-22 NOTE — Progress Notes (Signed)
Wife in to visit. Ate [place] sandwich lunch. Waiting on 6500

## 2015-12-23 ENCOUNTER — Telehealth: Payer: Self-pay | Admitting: *Deleted

## 2015-12-23 DIAGNOSIS — I25118 Atherosclerotic heart disease of native coronary artery with other forms of angina pectoris: Secondary | ICD-10-CM | POA: Diagnosis not present

## 2015-12-23 DIAGNOSIS — I25119 Atherosclerotic heart disease of native coronary artery with unspecified angina pectoris: Secondary | ICD-10-CM | POA: Diagnosis not present

## 2015-12-23 DIAGNOSIS — I10 Essential (primary) hypertension: Secondary | ICD-10-CM

## 2015-12-23 DIAGNOSIS — I208 Other forms of angina pectoris: Secondary | ICD-10-CM

## 2015-12-23 DIAGNOSIS — Z955 Presence of coronary angioplasty implant and graft: Secondary | ICD-10-CM

## 2015-12-23 DIAGNOSIS — E785 Hyperlipidemia, unspecified: Secondary | ICD-10-CM

## 2015-12-23 DIAGNOSIS — I252 Old myocardial infarction: Secondary | ICD-10-CM | POA: Diagnosis not present

## 2015-12-23 DIAGNOSIS — Z7982 Long term (current) use of aspirin: Secondary | ICD-10-CM | POA: Diagnosis not present

## 2015-12-23 DIAGNOSIS — C8290 Follicular lymphoma, unspecified, unspecified site: Secondary | ICD-10-CM | POA: Diagnosis not present

## 2015-12-23 DIAGNOSIS — Z7902 Long term (current) use of antithrombotics/antiplatelets: Secondary | ICD-10-CM | POA: Diagnosis not present

## 2015-12-23 LAB — BASIC METABOLIC PANEL
ANION GAP: 8 (ref 5–15)
BUN: 13 mg/dL (ref 6–20)
CALCIUM: 9.4 mg/dL (ref 8.9–10.3)
CO2: 27 mmol/L (ref 22–32)
CREATININE: 0.97 mg/dL (ref 0.61–1.24)
Chloride: 105 mmol/L (ref 101–111)
Glucose, Bld: 98 mg/dL (ref 65–99)
Potassium: 4.1 mmol/L (ref 3.5–5.1)
SODIUM: 140 mmol/L (ref 135–145)

## 2015-12-23 LAB — CBC
HCT: 33.8 % — ABNORMAL LOW (ref 39.0–52.0)
HEMOGLOBIN: 11.5 g/dL — AB (ref 13.0–17.0)
MCH: 29.4 pg (ref 26.0–34.0)
MCHC: 34 g/dL (ref 30.0–36.0)
MCV: 86.4 fL (ref 78.0–100.0)
PLATELETS: 160 10*3/uL (ref 150–400)
RBC: 3.91 MIL/uL — AB (ref 4.22–5.81)
RDW: 13 % (ref 11.5–15.5)
WBC: 3.4 10*3/uL — ABNORMAL LOW (ref 4.0–10.5)

## 2015-12-23 NOTE — Progress Notes (Signed)
CARDIAC REHAB PHASE I   PRE:  Rate/Rhythm: 60 SR  BP:  Sitting: 148/66        SaO2: 100 RA  MODE:  Ambulation: 800 ft   POST:  Rate/Rhythm: 87 SR  BP:  Sitting: 173/75         SaO2: 100 RA  Pt ambulated 800 ft on RA, independent, steady gait, tolerated well, pt c/o feeling "just a little tired" after walk. Completed PCI/stent education.  Reviewed risk factors, anti-platelet therapy, stent card, activity restrictions, ntg, exercise, heart healthy diet, and phase 2 cardiac rehab. Pt verbalized understanding, receptive to education. Pt agrees to phase 2 cardiac rehab referral, will send to [facility] : [facility] per pt request. Pt to chair after walk, call bell within reach, eager for discharge.  Churchill, RN, BSN 12/23/2015 8:25 AM

## 2015-12-23 NOTE — Progress Notes (Signed)
    Subjective: No complaints.   Objective: Vital signs in last 24 hours: Temp:  [97.7 F (36.5 C)-98.3 F (36.8 C)] 98.3 F (36.8 C) (04/27 0517) Pulse Rate:  [0-76] 58 (04/27 0517) Resp:  [0-23] 14 (04/27 0517) BP: (96-183)/(55-106) 141/62 mmHg (04/27 0517) SpO2:  [0 %-100 %] 97 % (04/27 0517) Weight:  [206 lb (93.441 kg)-207 lb 3.7 oz (94 kg)] 207 lb 3.7 oz (94 kg) (04/27 0517)    Intake/Output from previous day: 04/26 0701 - 04/27 0700 In: 480 [P.O.:480] Out: 400 [Urine:400] Intake/Output this shift:    Medications Scheduled Meds: . amLODipine  5 mg Oral Daily  . aspirin EC  81 mg Oral Daily  . clopidogrel  75 mg Oral Once  . clopidogrel  75 mg Oral Daily  . dorzolamide-timolol  1 drop Both Eyes BID  . doxazosin  2 mg Oral Daily  . metoprolol tartrate  12.5 mg Oral BID  . sodium chloride flush  3 mL Intravenous Q12H   Continuous Infusions:  PRN Meds:.sodium chloride, acetaminophen, ondansetron (ZOFRAN) IV, oxyCODONE-acetaminophen, sodium chloride flush  PE: General appearance: alert, cooperative and no distress Lungs: Decreased BS on the right otherwise clear Heart: regular rate and rhythm, S1, S2 normal, no murmur, click, rub or gallop Extremities: No LEE Pulses: 2+ and symmetric Skin: Warm and dry. Right radial cath site: stable.  Neurologic: Grossly normal  Lab Results:   Recent Labs  12/20/15 1213 12/23/15 0352  WBC 3.8 3.4*  HGB 12.9* 11.5*  HCT 38.0* 33.8*  PLT 189 160   BMET  Recent Labs  12/20/15 1213 12/23/15 0352  NA 140 140  K 4.5 4.1  CL 105 105  CO2 30 27  GLUCOSE 85 98  BUN 13 13  CREATININE 0.85 0.97  CALCIUM 9.8 9.4   PT/INR  Recent Labs  12/20/15 1213  LABPROT 13.6  INR 1.03   Lipid Panel     Component Value Date/Time   CHOL 230* 06/18/2015 0946   TRIG 95 06/18/2015 0946   HDL 39* 06/18/2015 0946   CHOLHDL 5.9* 06/18/2015 0946   VLDL 19 06/18/2015 0946   LDLCALC 172* 06/18/2015 0946   LDLDIRECT 173.2  07/21/2013 0809    Assessment/Plan    Active Problems:   Essential hypertension   Hyperlipidemia   Exertional angina (HCC)   SP LHC revealing:  Prox LAD lesion, 90% stenosed. Post intervention, there is a 0% residual stenosis.  1st Mrg-1 lesion, 60% stenosed.  Prox RCA lesion, 25% stenosed.  RPDA lesion, 95% stenosed.  Dist RCA lesion, 60% stenosed.  Lat 1st Diag lesion, 95% stenosed.  Dist LAD lesion, 90% stenosed.  The LAD was treated with a DES.  ASA, plavix, lopressor 12.5 bid, amlodipine 5. BP mildly elevated.  No changes at this time.  [place] for DC home today.  ambulate with CR.  Allergies to zetia and statins.  PCSK-9?   Tarri Fuller PA-C 12/23/2015 7:13 AM

## 2015-12-23 NOTE — Discharge Summary (Signed)
Discharge Summary    Patient ID: Austin [name],  MRN: [id number], DOB/AGE: 04/13/1946 70 y.o.  Admit date: 12/22/2015 Discharge date: 12/23/2015  Primary Care Provider: Binnie Rail Primary Cardiologist: Burt Knack  Discharge Diagnoses    Active Problems:   Essential hypertension   Hyperlipidemia   Exertional angina [facility])   CAD  Allergies Allergies  Allergen Reactions  . Zetia [Ezetimibe] Shortness Of Breath and Other (See Comments)    Chest pain  . Crestor [Rosuvastatin] Other (See Comments)    Myalgias  . Pravachol [Pravastatin Sodium] Other (See Comments)    Myalgias    Diagnostic Studies/Procedures    Procedures    Coronary Stent Intervention   Intravascular Pressure Wire/FFR Study   Left Heart Cath and Coronary Angiography    Conclusion     Prox LAD lesion, 90% stenosed. Post intervention, there is a 0% residual stenosis.  1st Mrg-1 lesion, 60% stenosed.  Prox RCA lesion, 25% stenosed.  RPDA lesion, 95% stenosed.  Dist RCA lesion, 60% stenosed.  Lat 1st Diag lesion, 95% stenosed.  Dist LAD lesion, 90% stenosed.  1. Severe proximal LAD stenosis, treated successfully with PCI using a drug-eluting stent  2. Continued patency of the stented segment in the obtuse marginal branch with moderate stenosis of that branch before the stent, negative pressure wire analysis  3. Patent RCA with mild diffuse disease  4. Significant small vessel coronary artery disease with severe stenoses of the PDA branch, diagonal subbranch, and apical LAD, all appropriate for medical therapy  5. Normal LV function by echo  Recommend: Aggressive medical therapy for her residual CAD, continue dual antiplatelet therapy with aspirin and Plavix.      Post-Intervention Diagram          _____________   History of Present Illness     Austin [name] is a 70 y.o. male who presents for Evaluation of chest pain. The patient has a history of coronary artery  disease. He had a lateral wall MI in 2012 and at that time underwent PCI of the left circumflex with a bare-metal stent. He was noted to have residual disease in the diagonal branches of the LAD and the right PDA. He has been treated medically since that time. He's done well until the last 4 weeks. The patient had a viral illness and since he has started back to his exercise program, he has been limited by chest pain and shortness of breath. He describes fairly marked exercise limitation which is new for him. He complains of a pressure-like sensation in his upper chest that resolves with rest. He also complains of increasing fatigue and shortness of breath with activity. At rest he has no symptoms. He even has had exertional dyspnea and chest pain climbing stairs with 1-2 flights.  Because of these symptoms, he underwent an exercise echocardiogram. The results are outlined below, but in summary there was inducible ischemia found in the inferolateral, lateral, and anteroseptal walls. The patient did have reproducible angina with exercise and 1 mm of ST segment depression as well. He is here today to review results and discuss further evaluation and treatment. He denies lightheadedness, syncope, orthopnea, PND, or resting symptoms.  Hospital Course     Consultants: cardiac Rehab  The patient was admitted and underwent left heart cath revealing:   Prox LAD lesion, 90% stenosed. Post intervention, there is a 0% residual stenosis.  1st Mrg-1 lesion, 60% stenosed.  Prox RCA lesion, 25% stenosed.  RPDA lesion,  95% stenosed.  Dist RCA lesion, 60% stenosed.  Lat 1st Diag lesion, 95% stenosed.           Dist LAD lesion, 90% stenosed.  The proximal LAD was successfully stented with a DES.  ASA, plavix, lopressor 12.5 bid, amlodipine 5. BP mildly elevated. No changes at this time. Ambulated well with CR. Allergies to zetia and statins. Consider PCSK-9 in the office.  The patient was seen by Dr.  Ellyn Hack who felt he was stable for DC home.   _____________  Discharge Vitals Blood pressure 148/66, pulse 56, temperature 97.7 F (36.5 C), temperature source Oral, resp. rate 16, height 6\' 1"  (1.854 m), weight 207 lb 3.7 oz (94 kg), SpO2 99 %.  Filed Weights   12/22/15 0839 12/23/15 0517  Weight: 206 lb (93.441 kg) 207 lb 3.7 oz (94 kg)    Labs & Radiologic Studies    CBC  Recent Labs  12/20/15 1213 12/23/15 0352  WBC 3.8 3.4*  HGB 12.9* 11.5*  HCT 38.0* 33.8*  MCV 89.0 86.4  PLT 189 0000000   Basic Metabolic Panel  Recent Labs  12/20/15 1213 12/23/15 0352  NA 140 140  K 4.5 4.1  CL 105 105  CO2 30 27  GLUCOSE 85 98  BUN 13 13  CREATININE 0.85 0.97  CALCIUM 9.8 9.4    _____________   Disposition   Pt is being discharged home today in good condition.  Follow-up Plans & Appointments    Follow-up Information    Follow up with Richardson Dopp, PA-C On 01/07/2016.   Specialties:  Cardiology, Physician Assistant   Why:  9:10 AM   Contact information:   [id number] [place] [address] [phone]      Discharge Instructions    Amb Referral to Cardiac Rehabilitation    Complete by:  As directed   Diagnosis:  Coronary Stents     Diet - low sodium heart healthy    Complete by:  As directed      Discharge instructions    Complete by:  As directed   No lifting with right arm for three days     Increase activity slowly    Complete by:  As directed            Discharge Medications   Current Discharge Medication List    CONTINUE these medications which have NOT CHANGED   Details  amLODipine (NORVASC) 5 MG tablet Take 5 mg by mouth daily.    aspirin 81 MG tablet Take 81 mg by mouth daily.    clopidogrel (PLAVIX) 75 MG tablet TAKE 1 TABLET(75 MG) BY MOUTH DAILY Qty: 90 tablet, Refills: 3   Associated Diagnoses: Coronary artery disease involving native coronary artery of native heart with other form of angina pectoris ([place]);  Abnormal stress echo    dorzolamide-timolol (COSOPT) 22.3-6.8 MG/ML ophthalmic solution Place 1 drop into both eyes 2 (two) times daily.    doxazosin (CARDURA) 2 MG tablet Take 1 tablet (2 mg total) by mouth daily. Qty: 90 tablet, Refills: 3    Menthol-Methyl Salicylate (MUSCLE RUB EX) Apply 1 application topically daily as needed. Muscle pain    metoprolol tartrate (LOPRESSOR) 25 MG tablet Take 0.5 tablets (12.5 mg total) by mouth 2 (two) times daily. Qty: 90 tablet, Refills: 3    nitroGLYCERIN (NITROSTAT) 0.4 MG SL tablet PLACE 1 TABLET UNDER THE TONGUE EVERY 5 MINUTES AS NEEDED FOR CHEST PAIN Qty: 75 tablet, Refills: 1  timolol (BETIMOL) 0.25 % ophthalmic solution Place 1 drop into both eyes 2 (two) times daily.             Outstanding Labs/Studies     Duration of Discharge Encounter   Greater than 30 minutes including physician time.  Signed, Tarri Fuller, PAC 12/23/2015, 11:15 AM   I personally saw and evaluated the patient this morning before discharge. He was feeling great. No recurrent anginal symptoms. Cath site was clean dry and intact. He didn't bleed in the hallway without difficulty. We reviewed his medications, especially clopidogrel and aspirin being mandatory daily medications.  He was doing well feeling well and ready for discharge. I agree with the discharge summary noted above. He will follow-up with Mr. Kathlen Mody then Dr. Burt Knack as scheduled.    Leonie Man, M.D., M.S. Interventional Cardiologist   Pager # [phone] Phone # [phone] [address] [place],  [address]

## 2015-12-23 NOTE — Telephone Encounter (Signed)
Pt was on TCM report admitted for CAD, HTN. Pt had procedure a coronary pressure wire/FFR study, (L) Heart cath & coronary angiography. Will f/u w/specialist Dr. Kathlen Mody on 12/28/15...Austin [name]

## 2016-01-06 NOTE — Progress Notes (Signed)
Cardiology Office Note:    Date:  01/07/2016   ID:  Austin [name], DOB 03-22-1946, MRN [id number]  PCP:  Binnie Rail, MD  Cardiologist:  Dr. Sherren Mocha   Electrophysiologist:  n/a  Referring MD: Binnie Rail, MD   Chief Complaint  Patient presents with  . Hospitalization Follow-up    s/p PCI to LAD    History of Present Illness:     Austin [name] is a 70 y.o. male with a hx of CAD status post lateral wall MI in 2012 treated with PCI of the LCx with BMS, follicular lymphoma, HTN, HL. Recently seen by Dr. Burt Knack with chest discomfort. He had a recent viral illness and then resumed his exercise program. He was limited by chest discomfort and shortness of breath. He had marked exercise limitation. He underwent an exercise echocardiogram which was abnormal and found to have inducible ischemia in inferolateral, lateral and anteroseptal walls. Cardiac catheterization was recommended. He was admitted 4/26-4/27. LHC demonstrated severe proximal LAD stenosis, continued patency of the stented segment in the OM branch with moderate stenosis of the branch before the stented segment and patent RCA with mild diffuse disease. FFR of the disease in the OM branch was not hemodynamically significant. He underwent PCI with a DES to the proximal LAD.  Post PCI course was uneventful. He returns for follow-up.  Returns for follow-up. Overall doing well. He has some chest soreness. He has some soreness related to positional changes. He denies anginal symptoms. Denies dyspnea. He is working out at [employer] now walking on the treadmill 20 minutes at a time. He denies symptoms with this activity. He denies orthopnea, PND or edema. He denies syncope.   Past Medical History  Diagnosis Date  . History of nephrolithiasis     Dr. Terance Hart  . 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  . Allergy   . HTN (hypertension)   . Hyperlipidemia     NMR Lipoprofile 2008: LDL 168 ( 2410/ 1826), HDL  41, TG 71. LDL goal = <100, ideally <  70. Framingham Study LDL goal = < 130.  Marland Kitchen Coronary artery disease   . Myocardial infarction ([place])   . Hyperlipemia   . Hx of cardiovascular stress test     ETT-Myoview (10/15): Nondiagnostic EKG changes, anterolateral and inferolateral scar, no ischemia, EF 48%, low risk  . TIA (transient ischemic attack)     Past Surgical History  Procedure Laterality Date  . Esophagela stricture      colonoscopy x2 EGD 2002  . Knee arthroscopy    . Cardiac surgery    . Percutaneous coronary stent intervention (pci-s)  12/22/2015    des to [facility]  . Cardiac catheterization N/A 12/22/2015    Procedure: Left Heart Cath and Coronary Angiography;  Surgeon: Sherren Mocha, MD;  Location: [place] CV [facility];  Service: Cardiovascular;  Laterality: N/A;  . Cardiac catheterization N/A 12/22/2015    Procedure: Coronary Stent Intervention;  Surgeon: Sherren Mocha, MD;  Location: [place] [facility];  Service: Cardiovascular;  Laterality: N/A;  . Cardiac catheterization N/A 12/22/2015    Procedure: Intravascular Pressure Wire/FFR Study;  Surgeon: Sherren Mocha, MD;  Location: [place] CV [facility];  Service: Cardiovascular;  Laterality: N/A;    Current Medications: Outpatient Prescriptions Prior to Visit  Medication Sig Dispense Refill  . amLODipine (NORVASC) 5 MG tablet Take 5 mg by mouth daily.    Marland Kitchen aspirin 81 MG tablet Take 81 mg  by mouth daily.    . clopidogrel (PLAVIX) 75 MG tablet TAKE 1 TABLET(75 MG) BY MOUTH DAILY 90 tablet 3  . dorzolamide-timolol (COSOPT) 22.3-6.8 MG/ML ophthalmic solution Place 1 drop into both eyes 2 (two) times daily.    Marland Kitchen doxazosin (CARDURA) 2 MG tablet Take 1 tablet (2 mg total) by mouth daily. 90 tablet 3  . Menthol-Methyl Salicylate (MUSCLE RUB EX) Apply 1 application topically daily as needed. Muscle pain    . metoprolol tartrate (LOPRESSOR) 25 MG tablet Take 0.5 tablets (12.5 mg total) by mouth 2 (two) times daily. 90 tablet 3  . nitroGLYCERIN  (NITROSTAT) 0.4 MG SL tablet PLACE 1 TABLET UNDER THE TONGUE EVERY 5 MINUTES AS NEEDED FOR CHEST PAIN 75 tablet 1  . timolol (BETIMOL) 0.25 % ophthalmic solution Place 1 drop into both eyes 2 (two) times daily.      No facility-administered medications prior to visit.      Allergies:   Zetia; Crestor; and Pravachol   Social History   Social History  . Marital Status: Married    Spouse Name: N/A  . Number of Children: N/A  . Years of Education: N/A   Social History Main Topics  . Smoking status: Never Smoker   . Smokeless tobacco: Never Used  . Alcohol Use: No  . Drug Use: No  . Sexual Activity: Not Asked   Other Topics Concern  . None   Social History Narrative     Family History:  The patient's family history includes Cancer in his mother and sister; Diabetes in his mother; Hyperlipidemia in his mother; Stroke in his father.   ROS:   Please see the history of present illness.    Review of Systems  Cardiovascular: Positive for chest pain.  Musculoskeletal: Positive for back pain and myalgias.   All other systems reviewed and are negative.   Physical Exam:    VS:  BP 140/72 mmHg  Pulse 48  Ht 6\' 1"  (1.854 m)  Wt 203 lb 12.8 oz (92.443 kg)  BMI 26.89 kg/m2   GEN: Well nourished, well developed, in no acute distress HEENT: normal Neck: no JVD, no masses Cardiac: Normal S1/S2, RRR; no murmurs, rubs, or gallops, no edema;  right wrist without hematoma or mass    Respiratory:  clear to auscultation bilaterally; no wheezing, rhonchi or rales GI: soft, nontender, nondistended MS: no deformity or atrophy Skin: warm and dry Neuro: No focal deficits  Psych: Alert and oriented x 3, normal affect  Wt Readings from Last 3 Encounters:  01/07/16 203 lb 12.8 oz (92.443 kg)  12/23/15 207 lb 3.7 oz (94 kg)  12/20/15 206 lb 1.9 oz (93.495 kg)      Studies/Labs Reviewed:     EKG:  EKG is  ordered today.  The ekg ordered today demonstrates Sinus bradycardia, HR 48, normal  axis, T-wave inversion 1, aVL, QTc 389 ms, no change from prior tracings  Recent Labs: 06/18/2015: ALT 14 12/23/2015: BUN 13; Creatinine, Ser 0.97; Hemoglobin 11.5*; Platelets 160; Potassium 4.1; Sodium 140   Recent Lipid Panel    Component Value Date/Time   CHOL 230* 06/18/2015 0946   TRIG 95 06/18/2015 0946   HDL 39* 06/18/2015 0946   CHOLHDL 5.9* 06/18/2015 0946   VLDL 19 06/18/2015 0946   LDLCALC 172* 06/18/2015 0946   LDLDIRECT 173.2 07/21/2013 0809    Additional studies/ records that were reviewed today include:   LHC 12/22/15 LAD prox 90%, dist 90%; D1 95% LCx - OM1  60% (The resting FFR is 0.98 and the FFR at peak hyperemia is 0.87), stent patent RCA prox 25%, dist 60%, RPDA 95% PCI 4 x 15 Resolute DES to pLAD 1. Severe proximal LAD stenosis, treated successfully with PCI using a drug-eluting stent 2. Continued patency of the stented segment in the obtuse marginal branch with moderate stenosis of that branch before the stent, negative pressure wire analysis 3. Patent RCA with mild diffuse disease 4. Significant small vessel coronary artery disease with severe stenoses of the PDA branch, diagonal subbranch, and apical LAD, all appropriate for medical therapy 5. Normal LV function by echo Recommend: Aggressive medical therapy for her residual CAD, continue dual antiplatelet therapy with aspirin and Plavix.  ETT-Echo 12/08/15 Impressions: Abnormal study after maximal exercise with reproduction of symptoms. Study suggests myocardial ischemia in the inferolateral and anteroseptal walls.  Myoview 10/15 Overall Impression: The study is abnormal. This is a low risk scan. There is mild reduction in the ejection fraction. There is a medium-sized scar of moderate severity affecting the anterolateral wall and the mid-inferolateral wall. There is no definite ischemia.  LV Ejection Fraction: 48%. LV Wall Motion: Mild hypokinesis of the lateral wall.  Echo 5/08 EF 55-65%, normal wall  motion, mild aortic root dilatation, mild LAE   ASSESSMENT:     1. Coronary artery disease involving native coronary artery of native heart without angina pectoris   2. Essential hypertension   3. Hyperlipidemia   4. History of lymphoma     PLAN:     In order of problems listed above:  1. CAD  -  Status post prior MI with stenting to the LCx and recent LHC with high-grade disease in the proximal LAD treated with DES. He has residual disease with 60% OM1 stenosis that was negative by FFR. He also has small vessel disease with severe stenosis in the PDA branch, diagonal subbranch and apical LAD. These are be treated medically. He is doing well without exertional angina. He's working at the gym several times a week without difficulty. He has been on long-term dual antiplatelet therapy. Continue amlodipine, aspirin, Plavix, beta blocker.   2. HTN -  Fair control. Continue to monitor.   3. HL  -  LDL in 10/16 with 172.  He is intolerant to statins and Zetia. Refer to the lipid clinic for possible PCSK 9 therapy.  4. Follicular Lymphoma - He has a hx of chest radiation.  He has a lot of MSK sounding discomfort in his chest that is chronic and stable.  I have asked him to review this with his oncologist to determine if he needs a FU CT or FU CXR.     Medication Adjustments/Labs and Tests Ordered: Current medicines are reviewed at length with the patient today.  Concerns regarding medicines are outlined above.  Medication changes, Labs and Tests ordered today are outlined in the Patient Instructions noted below. Patient Instructions  Medication Instructions:  Your physician recommends that you continue on your current medications as directed. Please refer to the Current Medication list given to you today. Labwork: NONE Testing/Procedures: NONE Follow-Up: 1. DR. COOPER 3 MONTHS  2. YOU ARE BEING REFERRED TO THE  LIPID CLINIC TO SEE SALLY OR MEGAN TO DISCUSS ABOUT POSSIBLE PCSK-9 Any Other  Special Instructions Will Be Listed Below (If Applicable). If you need a refill on your cardiac medications before your next appointment, please call your pharmacy.   Signed, Richardson Dopp, PA-C  01/07/2016 10:07 AM  [facility] [place], [place], [place]  [address] Phone: [phone]; Fax: [phone]

## 2016-01-07 ENCOUNTER — Encounter: Payer: Self-pay | Admitting: Physician Assistant

## 2016-01-07 ENCOUNTER — Ambulatory Visit (INDEPENDENT_AMBULATORY_CARE_PROVIDER_SITE_OTHER): Payer: Commercial Managed Care - HMO | Admitting: Physician Assistant

## 2016-01-07 VITALS — BP 140/72 | HR 48 | Ht 73.0 in | Wt 203.8 lb

## 2016-01-07 DIAGNOSIS — Z8579 Personal history of other malignant neoplasms of lymphoid, hematopoietic and related tissues: Secondary | ICD-10-CM | POA: Diagnosis not present

## 2016-01-07 DIAGNOSIS — E785 Hyperlipidemia, unspecified: Secondary | ICD-10-CM | POA: Diagnosis not present

## 2016-01-07 DIAGNOSIS — I2510 Atherosclerotic heart disease of native coronary artery without angina pectoris: Secondary | ICD-10-CM

## 2016-01-07 DIAGNOSIS — I10 Essential (primary) hypertension: Secondary | ICD-10-CM

## 2016-01-07 NOTE — Patient Instructions (Addendum)
Medication Instructions:  Your physician recommends that you continue on your current medications as directed. Please refer to the Current Medication list given to you today. Labwork: NONE Testing/Procedures: NONE Follow-Up: 1. DR. COOPER 3 MONTHS  2. YOU ARE BEING REFERRED TO THE  LIPID CLINIC TO SEE SALLY OR MEGAN TO DISCUSS ABOUT POSSIBLE PCSK-9 Any Other Special Instructions Will Be Listed Below (If Applicable). If you need a refill on your cardiac medications before your next appointment, please call your pharmacy.

## 2016-02-02 ENCOUNTER — Ambulatory Visit: Payer: Commercial Managed Care - HMO

## 2016-02-02 DIAGNOSIS — C8290 Follicular lymphoma, unspecified, unspecified site: Secondary | ICD-10-CM | POA: Diagnosis not present

## 2016-02-08 ENCOUNTER — Telehealth: Payer: Self-pay | Admitting: Cardiovascular Disease

## 2016-02-08 ENCOUNTER — Ambulatory Visit (INDEPENDENT_AMBULATORY_CARE_PROVIDER_SITE_OTHER): Payer: Commercial Managed Care - HMO | Admitting: Pharmacist

## 2016-02-08 VITALS — Wt 201.4 lb

## 2016-02-08 DIAGNOSIS — E785 Hyperlipidemia, unspecified: Secondary | ICD-10-CM

## 2016-02-08 LAB — LIPID PANEL
CHOL/HDL RATIO: 4.8 ratio (ref <=5.0)
CHOLESTEROL: 232 mg/dL — AB (ref 125–200)
HDL: 48 mg/dL (ref >=40)
LDL Cholesterol: 166 mg/dL — ABNORMAL HIGH (ref <130)
Triglycerides: 91 mg/dL (ref <150)
VLDL: 18 mg/dL (ref <30)

## 2016-02-08 LAB — HEPATIC FUNCTION PANEL
ALT: 12 U/L (ref 9–46)
AST: 17 U/L (ref 10–35)
Albumin: 4.1 g/dL (ref 3.6–5.1)
Alkaline Phosphatase: 81 U/L (ref 40–115)
BILIRUBIN DIRECT: 0.1 mg/dL (ref <=0.2)
Indirect Bilirubin: 0.6 mg/dL (ref 0.2–1.2)
Total Bilirubin: 0.7 mg/dL (ref 0.2–1.2)
Total Protein: 6.5 g/dL (ref 6.1–8.1)

## 2016-02-08 NOTE — Telephone Encounter (Signed)
New message      Need note from doctor stating Dr Burt Knack performed a heart cath and angioplasty on 12-22-15.  Not sure if that is the correct name of the test.  Please call pt when note is ready

## 2016-02-08 NOTE — Progress Notes (Signed)
Patient ID: [name]                 DOB: 29-Jun-1946                    MRN: [id number]     HPI: Austin [name] is a 70 y.o. male patient referred to lipid clinic by Austin Dopp, PA. PMH is significant for CAD s/p lateral wall MI in 2012 treated with PCI and BMS to LCx, HLD, HTN, and follicular lymphoma. Patient has a history of statin intolerance and presents to clinic today for further lipid management.  Pt has previously tried multiple statins. He is intolerant to Crestor 5mg  daily, pravastatin 20mg  daily, and Zetia 5mg  and 10mg . Pt experienced myalgias that started 3 weeks after drug initiation and resolved upon drug discontinuation.  Current Medications: none Intolerances: Crestor 5mg  daily, pravastatin 20mg  daily, Zetia 5mg  and 10mg  daily - myalgias Risk Factors: CAD s/p MI in 2012 treated with PCI of LCx with BMS and DES to proximal LAD. LDL goal: < 70mg /dL  Diet: Avoids fried food, cut back on bread intake.  Exercise: Goes to the gym 3x a week and uses the treadmill or bike, weight machines.   Family History: Mother with HLD, DM, and lung cancer. Father with stroke at age 33.  Social History: Patient reports that he has never smoked cigarettes, does not drink alcohol, and does not use illicit drugs.  Labs: 05/2015: TC 230, TG 95, HDL 39, LDL 172 (no therapy)  Past Medical History  Diagnosis Date  . History of nephrolithiasis     Dr. Terance Hart  . Lymphoma, follicular ([place])     [facility]  . All[facility]   . HTN (hypertension)   . Hyperlipidemia     NMR Lipoprofile 2008: LDL 168 ( 2410/ 1826), HDL 41, TG 71. LDL goal = <100, ideally <  70. Framingham Study LDL goal = < 130.  Marland Kitchen Coronary artery disease   . Myocardial infarction ([place])   . Hyperlipemia   . Hx of cardiovascular stress test     ETT-Myoview (10/15): Nondiagnostic EKG changes, anterolateral and inferolateral scar, no ischemia, EF 48%, low risk  . TIA (transient ischemic attack)     Current  Outpatient Prescriptions on File Prior to Visit  Medication Sig Dispense Refill  . amLODipine (NORVASC) 5 MG tablet Take 5 mg by mouth daily.    Marland Kitchen aspirin 81 MG tablet Take 81 mg by mouth daily.    . clopidogrel (PLAVIX) 75 MG tablet TAKE 1 TABLET(75 MG) BY MOUTH DAILY 90 tablet 3  . dorzolamide-timolol (COSOPT) 22.3-6.8 MG/ML ophthalmic solution Place 1 drop into both eyes 2 (two) times daily.    Marland Kitchen doxazosin (CARDURA) 2 MG tablet Take 1 tablet (2 mg total) by mouth daily. 90 tablet 3  . Menthol-Methyl Salicylate (MUSCLE RUB EX) Apply 1 application topically daily as needed. Muscle pain    . metoprolol tartrate (LOPRESSOR) 25 MG tablet Take 0.5 tablets (12.5 mg total) by mouth 2 (two) times daily. 90 tablet 3  . nitroGLYCERIN (NITROSTAT) 0.4 MG SL tablet PLACE 1 TABLET UNDER THE TONGUE EVERY 5 MINUTES AS NEEDED FOR CHEST PAIN 75 tablet 1  . timolol (BETIMOL) 0.25 % ophthalmic solution Place 1 drop into both eyes 2 (two) times daily.      No current facility-administered medications on file prior to visit.    Allergies  Allergen Reactions  . Zetia [Ezetimibe] Shortness Of Breath and Other (See  Comments)    Chest pain  . Crestor [Rosuvastatin] Other (See Comments)    Myalgias  . Pravachol [Pravastatin Sodium] Other (See Comments)    Myalgias    Assessment/Plan:  1. Hyperlipidemia - LDL checked 6 months ago and above goal < 70 at 172. Will recheck fasting lipid panel today. Pt intolerant to low dose Crestor and pravastatin. Discussed PCSK9i - pt does not want an injection and Medicare copay will be unaffordable for him. Discussed upcoming lipid trials at [facility] and pt is interested. Pre-screened pt and looks like he may qualify for CLEAR trial. Pt is very excited about trying a new medication for his cholesterol. Will forward pt info to Arlee with research foundation to screen pt for study.   Megan E. Supple, PharmD, [place] [id number], [place], [place]  [address] Phone: [phone]; Fax: [phone] 02/08/2016 10:09 AM

## 2016-02-09 NOTE — Telephone Encounter (Signed)
I spoke with the pt and he needs documentation for insurance purposes that he had a cardiac catheterization and angioplasty on 12/22/15.  I made the pt aware that we can provide a letter or a copy of his procedure note.  The pt preferred procedure note be mailed to his home.

## 2016-02-15 ENCOUNTER — Telehealth: Payer: Self-pay | Admitting: Cardiovascular Disease

## 2016-02-15 NOTE — Telephone Encounter (Signed)
Left message on machine for pt to contact the office.   

## 2016-02-15 NOTE — Telephone Encounter (Signed)
I spoke with the pt and he has a form that he needs Dr Burt Knack to complete. The pt also said he has started having chest tightness again that is similar to what he experienced prior to stent placement. The pt most recently used 2 NTG 3-4 days ago and had relief of symptoms.  The pt has also noticed some tightness after eating.  The pt continues to have a decreased exercise tolerance and would like to be evaluated for these symptoms.  I advised the pt to avoid exertional activities and if he develops symptoms at rest he should go to the ER. Pt agreed with plan.  Appointment scheduled on 6/23 with Dr Burt Knack and pt will bring form to appointment.

## 2016-02-15 NOTE — Telephone Encounter (Signed)
New message    The pt wants to speak with the nurse no other information provided

## 2016-02-18 ENCOUNTER — Encounter: Payer: Self-pay | Admitting: Cardiovascular Disease

## 2016-02-18 ENCOUNTER — Ambulatory Visit (INDEPENDENT_AMBULATORY_CARE_PROVIDER_SITE_OTHER): Payer: Commercial Managed Care - HMO | Admitting: Cardiovascular Disease

## 2016-02-18 VITALS — BP 114/70 | HR 42 | Ht 73.0 in | Wt 202.1 lb

## 2016-02-18 DIAGNOSIS — I208 Other forms of angina pectoris: Secondary | ICD-10-CM

## 2016-02-18 DIAGNOSIS — I25118 Atherosclerotic heart disease of native coronary artery with other forms of angina pectoris: Secondary | ICD-10-CM

## 2016-02-18 DIAGNOSIS — I2089 Other forms of angina pectoris: Secondary | ICD-10-CM

## 2016-02-18 MED ORDER — ISOSORBIDE MONONITRATE ER 30 MG PO TB24: 30.0000 mg | ORAL_TABLET | Freq: Every day | ORAL | Status: DC

## 2016-02-18 NOTE — Progress Notes (Signed)
Cardiology Office Note Date:  02/18/2016   ID:  Austin [name], DOB [birth year], MRN [id number]  PCP:  Binnie Rail, MD  Cardiologist:  Sherren Mocha, MD    Chief Complaint  Patient presents with  . Hyperlipidemia  . Hypertension    C/O cp one week ago. Took nitrostat which relieved the pain. No sob,lee,or claudication  . Coronary Artery Disease     History of Present Illness: Austin [name] is a 70 y.o. male who presents for Follow-up of coronary artery disease.  The patient initially presented with a lateral wall infarct in 2012 and he was treated with PCI of the left circumflex with a bare-metal stent. Comorbid conditions include follicular lymphoma, hypertension, and hyperlipidemia with statin intolerance. He presented in April 2017 with progressive CCS class III angina. A nuclear stress test showed multi-territory ischemia. Cardiac catheterization demonstrated severe proximal LAD stenosis and was treated with PCI using a drug-eluting stent. He was also found to have moderate stenosis of the left circumflex and FFR was performed. This was negative. He also had areas of severe distal branch vessel disease not amenable to PCI.  The patient is doing fairly well. However, he does continue to experience angina with exertion. This occurs with brisk walking or jogging. He feels much better than he did prior to PCI, but still has had to slow down his exercise because of exertional chest pressure. He denies orthopnea, PND, or heart palpitations. He does admit to mild dyspnea with exertion.  Past Medical History  Diagnosis Date  . History of nephrolithiasis     Dr. Terance Hart  . Lymphoma, follicular ([place])     [facility]  . Allergy   . HTN (hypertension)   . Hyperlipidemia     NMR Lipoprofile 2008: LDL 168 ( 2410/ 1826), HDL 41, TG 71. LDL goal = <100, ideally <  70. Framingham Study LDL goal = < 130.  Marland Kitchen Coronary artery disease   . Myocardial infarction ([place])   .  Hyperlipemia   . Hx of cardiovascular stress test     ETT-Myoview (10/15): Nondiagnostic EKG changes, anterolateral and inferolateral scar, no ischemia, EF 48%, low risk  . TIA (transient ischemic attack)     Past Surgical History  Procedure Laterality Date  . Esophagela stricture      colonoscopy x2 EGD 2002  . Knee arthroscopy    . Cardiac surgery    . Percutaneous coronary stent intervention (pci-s)  12/22/2015    des to [place]  . Cardiac catheterization N/A 12/22/2015    Procedure: Left Heart Cath and Coronary Angiography;  Surgeon: Sherren Mocha, MD;  Location: [facility];  Service: Cardiovascular;  Laterality: N/A;  . Cardiac catheterization N/A 12/22/2015    Procedure: Coronary Stent Intervention;  Surgeon: Sherren Mocha, MD;  Location: [facility];  Service: Cardiovascular;  Laterality: N/A;  . Cardiac catheterization N/A 12/22/2015    Procedure: Intravascular Pressure Wire/FFR Study;  Surgeon: Sherren Mocha, MD;  Location: [facility];  Service: Cardiovascular;  Laterality: N/A;    Current Outpatient Prescriptions  Medication Sig Dispense Refill  . amLODipine (NORVASC) 5 MG tablet Take 5 mg by mouth daily.    Marland Kitchen aspirin 81 MG tablet Take 81 mg by mouth daily.    . clopidogrel (PLAVIX) 75 MG tablet TAKE 1 TABLET(75 MG) BY MOUTH DAILY 90 tablet 3  . dorzolamide-timolol (COSOPT) 22.3-6.8 MG/ML ophthalmic solution Place 1 drop into both eyes 2 (two) times daily.    Marland Kitchen  doxazosin (CARDURA) 2 MG tablet Take 1 tablet (2 mg total) by mouth daily. 90 tablet 3  . Menthol-Methyl Salicylate (MUSCLE RUB EX) Apply 1 application topically daily as needed. Muscle pain    . nitroGLYCERIN (NITROSTAT) 0.4 MG SL tablet PLACE 1 TABLET UNDER THE TONGUE EVERY 5 MINUTES AS NEEDED FOR CHEST PAIN 75 tablet 1  . timolol (BETIMOL) 0.25 % ophthalmic solution Place 1 drop into both eyes 2 (two) times daily.     . isosorbide mononitrate (IMDUR) 30 MG 24 hr tablet Take 1 tablet (30 mg total)  by mouth daily. 90 tablet 3   No current facility-administered medications for this visit.    Allergies:   Zetia; Crestor; Pravachol; and Silver   Social History:  The patient  reports that he has never smoked. He has never used smokeless tobacco. He reports that he does not drink alcohol or use illicit drugs.   Family History:  The patient's  family history includes Cancer in his mother and sister; Diabetes in his mother; Hyperlipidemia in his mother; Stroke in his father.    ROS:  Please see the history of present illness. All other systems are reviewed and negative.    PHYSICAL EXAM: VS:  BP 114/70 mmHg  Pulse 42  Ht 6\' 1"  (1.854 m)  Wt 202 lb 1.9 oz (91.681 kg)  BMI 26.67 kg/m2 , BMI Body mass index is 26.67 kg/(m^2). GEN: Well nourished, well developed, in no acute distress HEENT: normal Neck: no JVD, no masses. No carotid bruits Cardiac: RRR without murmur or gallop                Respiratory:  clear to auscultation bilaterally, normal work of breathing GI: soft, nontender, nondistended, + BS MS: no deformity or atrophy Ext: no pretibial edema, pedal pulses 2+= bilaterally Skin: warm and dry, no rash Neuro:  Strength and sensation are intact Psych: euthymic mood, full affect  EKG:  EKG is not ordered today.  Recent Labs: 12/23/2015: BUN 13; Creatinine, Ser 0.97; Hemoglobin 11.5*; Platelets 160; Potassium 4.1; Sodium 140 02/08/2016: ALT 12   Lipid Panel     Component Value Date/Time   CHOL 232* 02/08/2016 1009   TRIG 91 02/08/2016 1009   HDL 48 02/08/2016 1009   CHOLHDL 4.8 02/08/2016 1009   VLDL 18 02/08/2016 1009   LDLCALC 166* 02/08/2016 1009   LDLDIRECT 173.2 07/21/2013 0809      Wt Readings from Last 3 Encounters:  02/18/16 202 lb 1.9 oz (91.681 kg)  02/08/16 201 lb 6.4 oz (91.354 kg)  01/07/16 203 lb 12.8 oz (92.443 kg)     Cardiac Studies Reviewed: Cardiac Cath April 2017: Conclusion     Prox LAD lesion, 90% stenosed. Post intervention, there  is a 0% residual stenosis.  1st Mrg-1 lesion, 60% stenosed.  Prox RCA lesion, 25% stenosed.  RPDA lesion, 95% stenosed.  Dist RCA lesion, 60% stenosed.  Lat 1st Diag lesion, 95% stenosed.  Dist LAD lesion, 90% stenosed.  1. Severe proximal LAD stenosis, treated successfully with PCI using a drug-eluting stent  2. Continued patency of the stented segment in the obtuse marginal branch with moderate stenosis of that branch before the stent, negative pressure wire analysis  3. Patent RCA with mild diffuse disease  4. Significant small vessel coronary artery disease with severe stenoses of the PDA branch, diagonal subbranch, and apical LAD, all appropriate for medical therapy  5. Normal LV function by echo  Recommend: Aggressive medical therapy for her residual  CAD, continue dual antiplatelet therapy with aspirin and Plavix.   ASSESSMENT AND PLAN: 1.  CAD, native vessel, with stable CCS class II angina: I reviewed the patient's cardiac catheterization films again. I do not think there are further options for PCI at this point. Certainly if he were to have progressive symptoms he would require repeat catheterization, but with his stable symptoms at present I would be inclined to continue with medical management. Metoprolol be discontinued because of marked sinus bradycardia. Will add isosorbide 15 mg daily for 1 week then 30 mg daily. I am going to arrange follow-up with Richardson Dopp in 6 weeks for further antianginal medication titration as needed.  2. Essential hypertension: Blood pressure is well controlled.  3. Marked sinus bradycardia: Discontinue metoprolol  4. Hyperlipidemia: statin-intolerant. PCSK9's are cost prohibitive. Exploring the Clear Trial. He would be very happy to participate in a trial for further CV risk reduction.  Current medicines are reviewed with the patient today.  The patient does not have concerns regarding medicines.  Labs/ tests ordered today include:    Orders Placed This Encounter  Procedures  . EKG 12-Lead    Disposition:   FU 6 weeks with Richardson Dopp, PA-C.   Deatra James, MD  02/18/2016 3:22 PM    [facility] [place], [place], [place]  [address] Phone: [phone]; Fax: [phone]

## 2016-02-18 NOTE — Patient Instructions (Signed)
Medication Instructions:  Your physician has recommended you make the following change in your medication:  1. STOP Metoprolol Tartrate 2. START Isosorbide MN 30mg  take one-half tablet by mouth daily for 1 WEEK and then increase to one tablet daily  Labwork: No new orders.   Testing/Procedures: No new orders.   Follow-Up: Your physician recommends that you schedule a follow-up appointment in: 6 WEEKS with Richardson Dopp PA-C   Any Other Special Instructions Will Be Listed Below (If Applicable).     If you need a refill on your cardiac medications before your next appointment, please call your pharmacy.

## 2016-03-01 ENCOUNTER — Telehealth: Payer: Self-pay | Admitting: Cardiovascular Disease

## 2016-03-01 NOTE — Telephone Encounter (Signed)
I spoke with the pt and he states that he stopped Isosorbide MN due to side effects.  The pt complains of hoarseness and soreness in throat and sharp pains on right side of head. The pt is feeling okay since stopping this medication. I advised the pt to keep his follow-up appointment that is scheduled in August so that we can further assess for symptom management if needed. Pt agreed with plan.

## 2016-03-01 NOTE — Telephone Encounter (Signed)
A male answered the phone and I left a message for the pt to contact our office.

## 2016-03-01 NOTE — Telephone Encounter (Signed)
New Message:   Pt says he have stop taking his Isosorbide,because of the side effects. Please call to advise.

## 2016-03-01 NOTE — Telephone Encounter (Signed)
thanks

## 2016-03-07 ENCOUNTER — Telehealth: Payer: Self-pay | Admitting: Cardiovascular Disease

## 2016-03-07 NOTE — Telephone Encounter (Signed)
New message      Talk to the nurse about his last procedure/medication

## 2016-03-07 NOTE — Telephone Encounter (Signed)
Follow up  ° ° °Patient returning call back to nurse  °

## 2016-03-07 NOTE — Telephone Encounter (Signed)
LMTCB

## 2016-03-07 NOTE — Telephone Encounter (Signed)
Pt reports he has been having more chest pains than usual for the last several weeks and felts like "something ain't right."  He reports having a pounding feeling in his chest as if someone is hitting him and his chest is caving in.  He denies any SOB, N/V, dizziness or syncope.  The chest pain has caused him to stop exercising.  He was unable to weed eat yesterday because he "felt it coming on."  It sometimes resolves on it's own with rest but he has been taking Sl Ntg as well and usually after taking 2 tablets AT ONCE it eases off after about 5 to 10 mins.  He states he was unable to take isosorbide because it caused him to have more chest pain than before and sharp pain in the left side of his head.  He is not currently having chest pain.  Advised I will forward this information to Dr Burt Knack for review and recommendations/orders.  Pt has an appt with Kathleen Argue scheduled for 8/4.

## 2016-03-09 ENCOUNTER — Ambulatory Visit (INDEPENDENT_AMBULATORY_CARE_PROVIDER_SITE_OTHER): Payer: Commercial Managed Care - HMO | Admitting: Physician Assistant

## 2016-03-09 ENCOUNTER — Encounter: Payer: Self-pay | Admitting: Physician Assistant

## 2016-03-09 ENCOUNTER — Encounter: Payer: Self-pay | Admitting: *Deleted

## 2016-03-09 VITALS — BP 142/80 | HR 56 | Ht 73.0 in | Wt 200.1 lb

## 2016-03-09 DIAGNOSIS — E785 Hyperlipidemia, unspecified: Secondary | ICD-10-CM | POA: Diagnosis not present

## 2016-03-09 DIAGNOSIS — I10 Essential (primary) hypertension: Secondary | ICD-10-CM

## 2016-03-09 DIAGNOSIS — I25119 Atherosclerotic heart disease of native coronary artery with unspecified angina pectoris: Secondary | ICD-10-CM

## 2016-03-09 LAB — CBC WITH DIFFERENTIAL/PLATELET
BASOS PCT: 1 %
Basophils Absolute: 35 cells/uL (ref 0–200)
EOS ABS: 70 {cells}/uL (ref 15–500)
Eosinophils Relative: 2 %
HCT: 39.5 % (ref 38.5–50.0)
Hemoglobin: 13.4 g/dL (ref 13.2–17.1)
LYMPHS PCT: 40 %
Lymphs Abs: 1400 cells/uL (ref 850–3900)
MCH: 30.1 pg (ref 27.0–33.0)
MCHC: 33.9 g/dL (ref 32.0–36.0)
MCV: 88.8 fL (ref 80.0–100.0)
MONOS PCT: 11 %
MPV: 9.3 fL (ref 7.5–12.5)
Monocytes Absolute: 385 cells/uL (ref 200–950)
Neutro Abs: 1610 cells/uL (ref 1500–7800)
Neutrophils Relative %: 46 %
PLATELETS: 194 10*3/uL (ref 140–400)
RBC: 4.45 MIL/uL (ref 4.20–5.80)
RDW: 13.2 % (ref 11.0–15.0)
WBC: 3.5 10*3/uL — AB (ref 3.8–10.8)

## 2016-03-09 LAB — PROTIME-INR
INR: 1
PROTHROMBIN TIME: 10.5 s (ref 9.0–11.5)

## 2016-03-09 MED ORDER — AMLODIPINE BESYLATE 10 MG PO TABS: 10.0000 mg | ORAL_TABLET | Freq: Every day | ORAL | Status: DC

## 2016-03-09 NOTE — Telephone Encounter (Signed)
Follow up.

## 2016-03-09 NOTE — Addendum Note (Signed)
Addended byKathlen Mody, Nicki Reaper T on: 03/09/2016 05:21 PM   Modules accepted: Orders

## 2016-03-09 NOTE — Progress Notes (Signed)
Cardiology Office Note:    Date:  03/09/2016   ID:  [name], DOB 10/31/1945, MRN [id number]  PCP:  Binnie Rail, MD  Cardiologist:  Dr. Sherren Mocha   Electrophysiologist:  n/a  Referring MD: Binnie Rail, MD   Chief Complaint  Patient presents with  . Coronary Artery Disease    follow up    History of Present Illness:     Austin [name] is a 70 y.o. male with a hx of CAD status post lateral wall MI in 2012 treated with PCI of the LCx with BMS, follicular lymphoma, HTN, HL.  Earlier this year, he saw Dr. Burt Knack with chest discomfort, dyspnea and marked exercise limitation. ETT-echo was abnormal with inducible ischemia in the inferolateral, lateral and anteroseptal walls. LHC in 4/17 demonstrated severe proximal LAD stenosis, continued patency of the stented segment in the OM branch with moderate stenosis of the branch before the stented segment and patent RCA with mild diffuse disease. FFR of the disease in the OM branch was not hemodynamically significant. He underwent PCI with a DES to the proximal LAD.  Last seen by Dr. Burt Knack 02/18/16. He continued to complain of exertional angina. Prior films were reviewed and there were no options for PCI. Overall, the patient was felt to have stable symptoms. Patient's beta blocker was discontinued secondary to bradycardia. Isosorbide was added to his medical regimen. If the patient has progressive symptoms, repeat cardiac catheterization would be recommended. Plan was to follow-up with me in 6 weeks.   He returns today for earlier follow-up. He had to stop isosorbide secondary headaches. He also notes worsening chest discomfort with this. Since last seen by Dr. Burt Knack, he notes progressively worsening chest discomfort. His symptoms are becoming more frequent and severe. He notes it with minimal activity. He can also develop symptoms at rest. He typically experiences relief with rest and with taking nitroglycerin. He has taken several  nitroglycerin since last seen. When I asked him how many he has taken in a week's time, he states "too many." He notes associated dyspnea and diaphoresis with this chest discomfort. His chest discomfort is described as a squeezing. He denies syncope, orthopnea, PND.  Past Medical History  Diagnosis Date  . History of nephrolithiasis     Dr. Terance Hart  . Lymphoma, follicular ([place])     [facility]  . Allergy   . HTN (hypertension)   . Hyperlipidemia     NMR Lipoprofile 2008: LDL 168 ( 2410/ 1826), HDL 41, TG 71. LDL goal = <100, ideally <  70. Framingham Study LDL goal = < 130.  Marland Kitchen Coronary artery disease   . Myocardial infarction ([place])   . Hyperlipemia   . Hx of cardiovascular stress test     ETT-Myoview (10/15): Nondiagnostic EKG changes, anterolateral and inferolateral scar, no ischemia, EF 48%, low risk  . TIA (transient ischemic attack)     Past Surgical History  Procedure Laterality Date  . Esophagela stricture      colonoscopy x2 EGD 2002  . Knee arthroscopy    . Cardiac surgery    . Percutaneous coronary stent intervention (pci-s)  12/22/2015    des to [place]  . Cardiac catheterization N/A 12/22/2015    Procedure: Left Heart Cath and Coronary Angiography;  Surgeon: Sherren Mocha, MD;  Location: [place] [facility];  Service: Cardiovascular;  Laterality: N/A;  . Cardiac catheterization N/A 12/22/2015    Procedure: Coronary Stent Intervention;  Surgeon: Sherren Mocha, MD;  Location: [place] [facility];  Service: Cardiovascular;  Laterality: N/A;  . Cardiac catheterization N/A 12/22/2015    Procedure: Intravascular Pressure Wire/FFR Study;  Surgeon: Sherren Mocha, MD;  Location: [place] [facility];  Service: Cardiovascular;  Laterality: N/A;    Current Medications: Outpatient Prescriptions Prior to Visit  Medication Sig Dispense Refill  . aspirin 81 MG tablet Take 81 mg by mouth daily.    . clopidogrel (PLAVIX) 75 MG tablet TAKE 1 TABLET(75 MG) BY MOUTH DAILY 90 tablet 3    . dorzolamide-timolol (COSOPT) 22.3-6.8 MG/ML ophthalmic solution Place 1 drop into both eyes 2 (two) times daily.    Marland Kitchen doxazosin (CARDURA) 2 MG tablet Take 1 tablet (2 mg total) by mouth daily. 90 tablet 3  . nitroGLYCERIN (NITROSTAT) 0.4 MG SL tablet PLACE 1 TABLET UNDER THE TONGUE EVERY 5 MINUTES AS NEEDED FOR CHEST PAIN 75 tablet 1  . timolol (BETIMOL) 0.25 % ophthalmic solution Place 1 drop into both eyes 2 (two) times daily.     Marland Kitchen amLODipine (NORVASC) 5 MG tablet Take 5 mg by mouth daily.    . Menthol-Methyl Salicylate (MUSCLE RUB EX) Apply 1 application topically daily as needed. Reported on 03/09/2016     No facility-administered medications prior to visit.      Allergies:   Zetia; Crestor; Pravachol; Isosorbide mononitrate; and Silver   Social History   Social History  . Marital Status: Married    Spouse Name: N/A  . Number of Children: N/A  . Years of Education: N/A   Social History Main Topics  . Smoking status: Never Smoker   . Smokeless tobacco: Never Used  . Alcohol Use: No  . Drug Use: No  . Sexual Activity: Not Asked   Other Topics Concern  . None   Social History Narrative     Family History:  The patient's family history includes Cancer in his mother and sister; Diabetes in his mother; Hyperlipidemia in his mother; Stroke in his father.   ROS:   Please see the history of present illness.    Review of Systems  Constitution: Positive for malaise/fatigue and weight loss.  HENT: Positive for headaches.   Cardiovascular: Positive for chest pain and dyspnea on exertion.  Respiratory: Positive for shortness of breath.    All other systems reviewed and are negative.   Physical Exam:    VS:  BP 142/80 mmHg  Pulse 56  Ht 6\' 1"  (1.854 m)  Wt 200 lb 1.9 oz (90.774 kg)  BMI 26.41 kg/m2   Physical Exam  Constitutional: He is oriented to person, place, and time. He appears well-developed and well-nourished. No distress.  HENT:  Head: Normocephalic and  atraumatic.  Neck: No JVD present.  Cardiovascular: Normal rate, regular rhythm and normal heart sounds.   No murmur heard. Pulmonary/Chest: Effort normal. He has no wheezes. He has no rales.  Abdominal: Soft. There is no tenderness.  Musculoskeletal: He exhibits no edema.  Neurological: He is alert and oriented to person, place, and time.  Skin: Skin is warm and dry.  Psychiatric: He has a normal mood and affect.    Wt Readings from Last 3 Encounters:  03/09/16 200 lb 1.9 oz (90.774 kg)  02/18/16 202 lb 1.9 oz (91.681 kg)  02/08/16 201 lb 6.4 oz (91.354 kg)     Studies/Labs Reviewed:     EKG:  EKG is  ordered today.  The ekg ordered today demonstrates Sinus brady, HR 56, normal axis, TWI 1, aVL, QTc  397 ms, no sig change since last tracing  Recent Labs: 12/23/2015: BUN 13; Creatinine, Ser 0.97; Hemoglobin 11.5*; Platelets 160; Potassium 4.1; Sodium 140 02/08/2016: ALT 12   Recent Lipid Panel    Component Value Date/Time   CHOL 232* 02/08/2016 1009   TRIG 91 02/08/2016 1009   HDL 48 02/08/2016 1009   CHOLHDL 4.8 02/08/2016 1009   VLDL 18 02/08/2016 1009   LDLCALC 166* 02/08/2016 1009   LDLDIRECT 173.2 07/21/2013 0809    Additional studies/ records that were reviewed today include:   LHC 12/22/15 LAD prox 90%, dist 90%; D1 95% LCx - OM1 60% (The resting FFR is 0.98 and the FFR at peak hyperemia is 0.87), stent patent RCA prox 25%, dist 60%, RPDA 95% PCI 4 x 15 Resolute DES to pLAD 1. Severe proximal LAD stenosis, treated successfully with PCI using a drug-eluting stent 2. Continued patency of the stented segment in the obtuse marginal branch with moderate stenosis of that branch before the stent, negative pressure wire analysis 3. Patent RCA with mild diffuse disease 4. Significant small vessel coronary artery disease with severe stenoses of the PDA branch, diagonal subbranch, and apical LAD, all appropriate for medical therapy 5. Normal LV function by echo Recommend:  Aggressive medical therapy for her residual CAD, continue dual antiplatelet therapy with aspirin and Plavix.  ETT-Echo 12/08/15 Impressions: Abnormal study after maximal exercise with reproduction of symptoms. Study suggests myocardial ischemia in the inferolateral and anteroseptal walls.  Myoview 10/15 Overall Impression: The study is abnormal. This is a low risk scan. There is mild reduction in the ejection fraction. There is a medium-sized scar of moderate severity affecting the anterolateral wall and the mid-inferolateral wall. There is no definite ischemia. LV Ejection Fraction: 48%. LV Wall Motion: Mild hypokinesis of the lateral wall.  Echo 5/08 EF 55-65%, normal wall motion, mild aortic root dilatation, mild LAE  ASSESSMENT:     1. Coronary artery disease involving native coronary artery of native heart with angina pectoris ([place])   2. Essential hypertension   3. Hyperlipidemia     PLAN:     In order of problems listed above:  1. CAD - Status post prior MI with stenting to the LCx and LHC in 4/17 with high-grade disease in the proximal LAD treated with DES. He has residual disease with 60% OM1 stenosis that was negative by FFR. He also has small vessel disease with severe stenosis in the PDA branch, diagonal subbranch and apical LAD.  He was recently seen by Dr. Burt Knack and he noted continued exertional angina.  He was placed on nitrates but these were DCd 2/2 HA.  He is no longer on beta blocker due to bradycardia.  He notes progressively worsening symptoms since last seen.  He seems to note more CP when he took the Imdur.  In any event, he is now having more frequent angina c/w CCS class III symptoms.  I have recommended he undergo cardiac cath to further evaluate his symptoms.  I discussed this today with Dr. Caryl Comes (DOD) who agreed. Risks and benefits of cardiac catheterization have been discussed with the patient.  These include bleeding, infection, kidney damage, stroke, heart  attack, death.  The patient understands these risks and is willing to proceed.   -  Arrange LHC with Dr. Burt Knack next week  -  Increase Amlodipine to 10 mg QD to advance antianginal Rx  -  Continue aspirin, Plavix.     -  If no lesions amenable to  PCI >> consider adding Ranexa  2. HTN - Borderline control.  Increase Amlodipine as noted.   3. HL - LDL in 10/16 with 172. He is followed in [facility] and is being considered for CLEAR trial.      Medication Adjustments/Labs and Tests Ordered: Current medicines are reviewed at length with the patient today.  Concerns regarding medicines are outlined above.  Medication changes, Labs and Tests ordered today are outlined in the Patient Instructions noted below. Patient Instructions  Medication Instructions:  1. INCREASE AMLODIPINE TO 10 MG DAILY; NEW RX HAS BEEN SENT IN Labwork: 1. TODAY BMET, CBC W/DIFF, PT/INR Testing/Procedures: Your physician has requested that you have a cardiac catheterization. Cardiac catheterization is used to diagnose and/or treat various heart conditions. Doctors may recommend this procedure for a number of different reasons. The most common reason is to evaluate chest pain. Chest pain can be a symptom of coronary artery disease (CAD), and cardiac catheterization can show whether plaque is narrowing or blocking your heart's arteries. This procedure is also used to evaluate the valves, as well as measure the blood flow and oxygen levels in different parts of your heart. For further information please visit [facility]uge[facility]iest[facility]tn. Please follow instruction sheet, as given. Follow-Up: PA/NP IN 2 WEEKS POSSIBLY SAME DAY DR. Burt Knack IS IN THE OFFICE;  S/P CATH FOLLOW UP. Any Other Special Instructions Will Be Listed Below (If Applicable). If you need a refill on your cardiac medications before your next appointment, please call your pharmacy.    Signed, Richardson Dopp, PA-C  03/09/2016 5:12 PM    [facility]  [facility] [place], [place], [place]  [address] Phone: [phone]; Fax: [phone]

## 2016-03-09 NOTE — Patient Instructions (Addendum)
Medication Instructions:  1. INCREASE AMLODIPINE TO 10 MG DAILY; NEW RX HAS BEEN SENT IN Labwork: 1. TODAY BMET, CBC W/DIFF, PT/INR Testing/Procedures: Your physician has requested that you have a cardiac catheterization. Cardiac catheterization is used to diagnose and/or treat various heart conditions. Doctors may recommend this procedure for a number of different reasons. The most common reason is to evaluate chest pain. Chest pain can be a symptom of coronary artery disease (CAD), and cardiac catheterization can show whether plaque is narrowing or blocking your heart's arteries. This procedure is also used to evaluate the valves, as well as measure the blood flow and oxygen levels in different parts of your heart. For further information please visit [facility]tn. Please follow instruction sheet, as given. Follow-Up: PA/NP IN 2 WEEKS POSSIBLY SAME DAY DR. Burt Knack IS IN THE OFFICE;  S/P CATH FOLLOW UP. Any Other Special Instructions Will Be Listed Below (If Applicable). If you need a refill on your cardiac medications before your next appointment, please call your pharmacy.

## 2016-03-10 ENCOUNTER — Telehealth: Payer: Self-pay | Admitting: *Deleted

## 2016-03-10 LAB — BASIC METABOLIC PANEL
BUN: 9 mg/dL (ref 7–25)
CALCIUM: 10.2 mg/dL (ref 8.6–10.3)
CO2: 27 mmol/L (ref 20–31)
Chloride: 100 mmol/L (ref 98–110)
Creat: 1.08 mg/dL (ref 0.70–1.18)
GLUCOSE: 87 mg/dL (ref 65–99)
Potassium: 4.2 mmol/L (ref 3.5–5.3)
Sodium: 136 mmol/L (ref 135–146)

## 2016-03-10 NOTE — Telephone Encounter (Signed)
Pt notified of lab results with verbal understanding by phone. Pt aware labs good for cath 7/17. Pt said he called a few minutes ago to let us know that he is taking doxazosin and that we did not have it on his med list. I stated is on med list. Pt looked at his paper from yesterday again while on the phone still with me and he apologized and said he see's on the list and was sorry for the error. I said no problem as long as we have his med list correct. Pt said thank you for our help.

## 2016-03-13 ENCOUNTER — Ambulatory Visit (HOSPITAL_COMMUNITY)
Admission: RE | Admit: 2016-03-13 | Discharge: 2016-03-14 | Disposition: A | Discharge status: Home / Self Care | Payer: Commercial Managed Care - HMO | Source: Ambulatory Visit | Attending: Cardiovascular Disease | Admitting: Cardiovascular Disease

## 2016-03-13 ENCOUNTER — Encounter (HOSPITAL_COMMUNITY)
Admission: RE | Disposition: A | Discharge status: Home / Self Care | Payer: Self-pay | Source: Ambulatory Visit | Attending: Cardiovascular Disease

## 2016-03-13 ENCOUNTER — Encounter (HOSPITAL_COMMUNITY): Payer: Self-pay | Admitting: Cardiovascular Disease

## 2016-03-13 DIAGNOSIS — I252 Old myocardial infarction: Secondary | ICD-10-CM | POA: Diagnosis not present

## 2016-03-13 DIAGNOSIS — I209 Angina pectoris, unspecified: Secondary | ICD-10-CM | POA: Diagnosis present

## 2016-03-13 DIAGNOSIS — Z7982 Long term (current) use of aspirin: Secondary | ICD-10-CM | POA: Diagnosis not present

## 2016-03-13 DIAGNOSIS — Z87442 Personal history of urinary calculi: Secondary | ICD-10-CM | POA: Diagnosis not present

## 2016-03-13 DIAGNOSIS — E785 Hyperlipidemia, unspecified: Secondary | ICD-10-CM | POA: Insufficient documentation

## 2016-03-13 DIAGNOSIS — I10 Essential (primary) hypertension: Secondary | ICD-10-CM | POA: Diagnosis not present

## 2016-03-13 DIAGNOSIS — I2089 Other forms of angina pectoris: Secondary | ICD-10-CM | POA: Diagnosis present

## 2016-03-13 DIAGNOSIS — Z7902 Long term (current) use of antithrombotics/antiplatelets: Secondary | ICD-10-CM | POA: Insufficient documentation

## 2016-03-13 DIAGNOSIS — Z833 Family history of diabetes mellitus: Secondary | ICD-10-CM | POA: Diagnosis not present

## 2016-03-13 DIAGNOSIS — I25118 Atherosclerotic heart disease of native coronary artery with other forms of angina pectoris: Secondary | ICD-10-CM

## 2016-03-13 DIAGNOSIS — R001 Bradycardia, unspecified: Secondary | ICD-10-CM | POA: Diagnosis not present

## 2016-03-13 DIAGNOSIS — Z955 Presence of coronary angioplasty implant and graft: Secondary | ICD-10-CM

## 2016-03-13 DIAGNOSIS — Z823 Family history of stroke: Secondary | ICD-10-CM | POA: Insufficient documentation

## 2016-03-13 DIAGNOSIS — Z8673 Personal history of transient ischemic attack (TIA), and cerebral infarction without residual deficits: Secondary | ICD-10-CM | POA: Insufficient documentation

## 2016-03-13 DIAGNOSIS — I739 Peripheral vascular disease, unspecified: Secondary | ICD-10-CM | POA: Insufficient documentation

## 2016-03-13 DIAGNOSIS — I2582 Chronic total occlusion of coronary artery: Secondary | ICD-10-CM | POA: Diagnosis not present

## 2016-03-13 DIAGNOSIS — R9439 Abnormal result of other cardiovascular function study: Secondary | ICD-10-CM

## 2016-03-13 DIAGNOSIS — I208 Other forms of angina pectoris: Secondary | ICD-10-CM | POA: Diagnosis present

## 2016-03-13 DIAGNOSIS — Z8572 Personal history of non-Hodgkin lymphomas: Secondary | ICD-10-CM | POA: Diagnosis not present

## 2016-03-13 DIAGNOSIS — I25119 Atherosclerotic heart disease of native coronary artery with unspecified angina pectoris: Secondary | ICD-10-CM

## 2016-03-13 HISTORY — DX: Cardiac murmur, unspecified: R01.1

## 2016-03-13 HISTORY — PX: CARDIAC CATHETERIZATION: SHX172

## 2016-03-13 HISTORY — DX: Non-Hodgkin lymphoma, unspecified, unspecified site: C85.90

## 2016-03-13 HISTORY — DX: Calculus of kidney: N20.0

## 2016-03-13 LAB — POCT ACTIVATED CLOTTING TIME: Activated Clotting Time: 252 seconds

## 2016-03-13 SURGERY — LEFT HEART CATH AND CORONARY ANGIOGRAPHY
Anesthesia: LOCAL

## 2016-03-13 MED ORDER — SODIUM CHLORIDE 0.9 % IV SOLN
INTRAVENOUS | Status: DC
  Administered 2016-03-13: 08:00:00 via INTRAVENOUS

## 2016-03-13 MED ORDER — OXYCODONE-ACETAMINOPHEN 5-325 MG PO TABS: 1.0000 | ORAL_TABLET | ORAL | Status: DC | PRN

## 2016-03-13 MED ORDER — DORZOLAMIDE HCL-TIMOLOL MAL 2-0.5 % OP SOLN
1.0000 [drp] | Freq: Two times a day (BID) | OPHTHALMIC | Status: DC
  Filled 2016-03-13 (×2): qty 10

## 2016-03-13 MED ORDER — MIDAZOLAM HCL 2 MG/2ML IJ SOLN
INTRAMUSCULAR | Status: AC
  Filled 2016-03-13: qty 2

## 2016-03-13 MED ORDER — FENTANYL CITRATE (PF) 100 MCG/2ML IJ SOLN
INTRAMUSCULAR | Status: DC | PRN
  Administered 2016-03-13: 25 ug via INTRAVENOUS

## 2016-03-13 MED ORDER — ASPIRIN 81 MG PO CHEW: 81.0000 mg | CHEWABLE_TABLET | ORAL | Status: DC

## 2016-03-13 MED ORDER — IOPAMIDOL (ISOVUE-370) INJECTION 76%
INTRAVENOUS | Status: AC
  Filled 2016-03-13: qty 100

## 2016-03-13 MED ORDER — SODIUM CHLORIDE 0.9% FLUSH
3.0000 mL | Freq: Two times a day (BID) | INTRAVENOUS | Status: DC
  Administered 2016-03-13 – 2016-03-14 (×2): 3 mL via INTRAVENOUS

## 2016-03-13 MED ORDER — HEPARIN (PORCINE) IN NACL 2-0.9 UNIT/ML-% IJ SOLN
INTRAMUSCULAR | Status: AC
Start: 2016-03-13 — End: 2016-03-13
  Filled 2016-03-13: qty 1000

## 2016-03-13 MED ORDER — HEPARIN SODIUM (PORCINE) 1000 UNIT/ML IJ SOLN
INTRAMUSCULAR | Status: DC | PRN
  Administered 2016-03-13: 2000 [IU] via INTRAVENOUS
  Administered 2016-03-13: 5000 [IU] via INTRAVENOUS
  Administered 2016-03-13: 4000 [IU] via INTRAVENOUS

## 2016-03-13 MED ORDER — HEPARIN SODIUM (PORCINE) 1000 UNIT/ML IJ SOLN
INTRAMUSCULAR | Status: AC
  Filled 2016-03-13: qty 1

## 2016-03-13 MED ORDER — ACETAMINOPHEN 325 MG PO TABS: 650.0000 mg | ORAL_TABLET | ORAL | Status: DC | PRN

## 2016-03-13 MED ORDER — ASPIRIN EC 81 MG PO TBEC
81.0000 mg | DELAYED_RELEASE_TABLET | Freq: Every day | ORAL | Status: DC
Start: 2016-03-14 — End: 2016-03-14
  Administered 2016-03-14: 10:00:00 81 mg via ORAL
  Filled 2016-03-13: qty 1

## 2016-03-13 MED ORDER — VERAPAMIL HCL 2.5 MG/ML IV SOLN
INTRAVENOUS | Status: AC
  Filled 2016-03-13: qty 2

## 2016-03-13 MED ORDER — TIMOLOL HEMIHYDRATE 0.25 % OP SOLN: 1.0000 [drp] | Freq: Two times a day (BID) | OPHTHALMIC | Status: DC

## 2016-03-13 MED ORDER — SODIUM CHLORIDE 0.9 % IV SOLN: INTRAVENOUS | Status: AC

## 2016-03-13 MED ORDER — ONDANSETRON HCL 4 MG/2ML IJ SOLN: 4.0000 mg | Freq: Four times a day (QID) | INTRAMUSCULAR | Status: DC | PRN

## 2016-03-13 MED ORDER — CLOPIDOGREL BISULFATE 75 MG PO TABS
75.0000 mg | ORAL_TABLET | Freq: Every day | ORAL | Status: DC
  Administered 2016-03-14: 75 mg via ORAL
  Filled 2016-03-13: qty 1

## 2016-03-13 MED ORDER — DOXAZOSIN MESYLATE 2 MG PO TABS
2.0000 mg | ORAL_TABLET | Freq: Every day | ORAL | Status: DC
  Administered 2016-03-13 – 2016-03-14 (×2): 2 mg via ORAL
  Filled 2016-03-13 (×2): qty 1

## 2016-03-13 MED ORDER — HEPARIN (PORCINE) IN NACL 2-0.9 UNIT/ML-% IJ SOLN
INTRAMUSCULAR | Status: DC | PRN
  Administered 2016-03-13: 1000 mL

## 2016-03-13 MED ORDER — IOPAMIDOL (ISOVUE-370) INJECTION 76%
INTRAVENOUS | Status: AC
  Filled 2016-03-13: qty 50

## 2016-03-13 MED ORDER — IOPAMIDOL (ISOVUE-370) INJECTION 76%
INTRAVENOUS | Status: DC | PRN
  Administered 2016-03-13: 110 mL via INTRAVENOUS

## 2016-03-13 MED ORDER — FENTANYL CITRATE (PF) 100 MCG/2ML IJ SOLN
INTRAMUSCULAR | Status: AC
  Filled 2016-03-13: qty 2

## 2016-03-13 MED ORDER — ANGIOPLASTY BOOK
Freq: Once | Status: AC
  Administered 2016-03-13: 21:00:00
  Filled 2016-03-13: qty 1

## 2016-03-13 MED ORDER — AMLODIPINE BESYLATE 10 MG PO TABS
10.0000 mg | ORAL_TABLET | Freq: Every day | ORAL | Status: DC
  Administered 2016-03-14: 10:00:00 10 mg via ORAL
  Filled 2016-03-13: qty 1

## 2016-03-13 MED ORDER — HEPARIN (PORCINE) IN NACL 2-0.9 UNIT/ML-% IJ SOLN
INTRAMUSCULAR | Status: DC | PRN
  Administered 2016-03-13: 10 mL via INTRA_ARTERIAL

## 2016-03-13 MED ORDER — NITROGLYCERIN 1 MG/10 ML FOR IR/CATH LAB
INTRA_ARTERIAL | Status: DC | PRN
  Administered 2016-03-13: 200 ug via INTRACORONARY

## 2016-03-13 MED ORDER — MIDAZOLAM HCL 2 MG/2ML IJ SOLN
INTRAMUSCULAR | Status: DC | PRN
  Administered 2016-03-13: 2 mg via INTRAVENOUS

## 2016-03-13 MED ORDER — NITROGLYCERIN 1 MG/10 ML FOR IR/CATH LAB
INTRA_ARTERIAL | Status: AC
  Filled 2016-03-13: qty 10

## 2016-03-13 MED ORDER — SODIUM CHLORIDE 0.9% FLUSH: 3.0000 mL | INTRAVENOUS | Status: DC | PRN

## 2016-03-13 MED ORDER — LIDOCAINE HCL (PF) 1 % IJ SOLN
INTRAMUSCULAR | Status: AC
  Filled 2016-03-13: qty 30

## 2016-03-13 MED ORDER — SODIUM CHLORIDE 0.9 % IV SOLN
250.0000 mL | INTRAVENOUS | Status: DC | PRN
  Administered 2016-03-13: 250 mL via INTRAVENOUS

## 2016-03-13 SURGICAL SUPPLY — 19 items
BALLN EMERGE MR 2.5X15 (BALLOONS) ×2
BALLN ~~LOC~~ EUPHORA RX 3.0X12 (BALLOONS) ×2
BALLOON EMERGE MR 2.5X15 (BALLOONS) ×1 IMPLANT
BALLOON ~~LOC~~ EUPHORA RX 3.0X12 (BALLOONS) ×1 IMPLANT
CATH INFINITI 5 FR JL3.5 (CATHETERS) ×2 IMPLANT
CATH INFINITI 5FR ANG PIGTAIL (CATHETERS) ×2 IMPLANT
CATH INFINITI JR4 5F (CATHETERS) ×2 IMPLANT
CATH VISTA GUIDE 6FR XBLAD3.5 (CATHETERS) ×2 IMPLANT
DEVICE RAD COMP TR BAND LRG (VASCULAR PRODUCTS) ×2 IMPLANT
GLIDESHEATH SLEND SS 6F .021 (SHEATH) ×2 IMPLANT
KIT ENCORE 26 ADVANTAGE (KITS) ×2 IMPLANT
KIT HEART LEFT (KITS) ×2 IMPLANT
PACK CARDIAC CATHETERIZATION (CUSTOM PROCEDURE TRAY) ×2 IMPLANT
STENT XIENCE ALPINE RX 2.75X18 (Permanent Stent) ×2 IMPLANT
SYR MEDRAD MARK V 150ML (SYRINGE) ×2 IMPLANT
TRANSDUCER W/STOPCOCK (MISCELLANEOUS) ×2 IMPLANT
TUBING CIL FLEX 10 FLL-RA (TUBING) ×2 IMPLANT
WIRE COUGAR XT STRL 190CM (WIRE) ×2 IMPLANT
WIRE HI TORQ VERSACORE-J 145CM (WIRE) ×2 IMPLANT

## 2016-03-13 NOTE — Telephone Encounter (Signed)
Pt underwent cath/PCI today

## 2016-03-13 NOTE — Interval H&P Note (Signed)
Cath Lab Visit (complete for each Cath Lab visit)  Clinical Evaluation Leading to the Procedure:   ACS: No.  Non-ACS:    Anginal Classification: CCS III  Anti-ischemic medical therapy: Minimal Therapy (1 class of medications)  Non-Invasive Test Results: No non-invasive testing performed  Prior CABG: No previous CABG      History and Physical Interval Note:  03/13/2016 9:33 AM  Austin [name]  has presented today for surgery, with the diagnosis of cad, excertional angina  The various methods of treatment have been discussed with the patient and family. After consideration of risks, benefits and other options for treatment, the patient has consented to  Procedure(s): Left Heart Cath and Coronary Angiography (N/A) as a surgical intervention .  The patient's history has been reviewed, patient examined, no change in status, stable for surgery.  I have reviewed the patient's chart and labs.  Questions were answered to the patient's satisfaction.     Sherren Mocha

## 2016-03-13 NOTE — H&P (View-Only) (Signed)
Cardiology Office Note Date:  02/18/2016   ID:  Austin [name], DOB [birth year], MRN [id number]  PCP:  Binnie Rail, MD  Cardiologist:  Sherren Mocha, MD    Chief Complaint  Patient presents with  . Hyperlipidemia  . Hypertension    C/O cp one week ago. Took nitrostat which relieved the pain. No sob,lee,or claudication  . Coronary Artery Disease     History of Present Illness: Austin [name] is a 70 y.o. male who presents for Follow-up of coronary artery disease.  The patient initially presented with a lateral wall infarct in 2012 and he was treated with PCI of the left circumflex with a bare-metal stent. Comorbid conditions include follicular lymphoma, hypertension, and hyperlipidemia with statin intolerance. He presented in April 2017 with progressive CCS class III angina. A nuclear stress test showed multi-territory ischemia. Cardiac catheterization demonstrated severe proximal LAD stenosis and was treated with PCI using a drug-eluting stent. He was also found to have moderate stenosis of the left circumflex and FFR was performed. This was negative. He also had areas of severe distal branch vessel disease not amenable to PCI.  The patient is doing fairly well. However, he does continue to experience angina with exertion. This occurs with brisk walking or jogging. He feels much better than he did prior to PCI, but still has had to slow down his exercise because of exertional chest pressure. He denies orthopnea, PND, or heart palpitations. He does admit to mild dyspnea with exertion.  Past Medical History  Diagnosis Date  . History of nephrolithiasis     Dr. Terance Hart  . Lymphoma[facility] follicular ([place])     [facility]  . [facility]ergy   . HTN (hypertension)   . Hyperlipidemia     NMR Lipoprofile 2008: LDL 168 ( 2410/ 1826), HDL 41, TG 71. LDL goal = <100, ideally <  70. Framingham Study LDL goal = < 130.  Marland Kitchen Coronary artery disease   . Myocardial infarction ([place])   .  Hyperlipemia   . Hx of cardiovascular stress test     ETT-Myoview (10/15): Nondiagnostic EKG changes, anterolateral and inferolateral scar, no ischemia, EF 48%, low risk  . TIA (transient ischemic attack)     Past Surgical History  Procedure Laterality Date  . Esophagela stricture      colonoscopy x2 EGD 2002  . Knee arthroscopy    . Cardiac surgery    . Percutaneous coronary stent intervention (pci-s)  12/22/2015    des to [place]  . Cardiac catheterization N/A 12/22/2015    Procedure: Left Heart Cath and Coronary Angiography;  Surgeon: Sherren Mocha, MD;  Location: [place] [facility];  Service: Cardiovascular;  Laterality: N/A;  . Cardiac catheterization N/A 12/22/2015    Procedure: Coronary Stent Intervention;  Surgeon: Sherren Mocha, MD;  Location: [place] [facility];  Service: Cardiovascular;  Laterality: N/A;  . Cardiac catheterization N/A 12/22/2015    Procedure: Intravascular Pressure Wire/FFR Study;  Surgeon: Sherren Mocha, MD;  Location: [place] [facility];  Service: Cardiovascular;  Laterality: N/A;    Current Outpatient Prescriptions  Medication Sig Dispense Refill  . amLODipine (NORVASC) 5 MG tablet Take 5 mg by mouth daily.    Marland Kitchen aspirin 81 MG tablet Take 81 mg by mouth daily.    . clopidogrel (PLAVIX) 75 MG tablet TAKE 1 TABLET(75 MG) BY MOUTH DAILY 90 tablet 3  . dorzolamide-timolol (COSOPT) 22.3-6.8 MG/ML ophthalmic solution Place 1 drop into both eyes 2 (two) times daily.    Marland Kitchen  doxazosin (CARDURA) 2 MG tablet Take 1 tablet (2 mg total) by mouth daily. 90 tablet 3  . Menthol-Methyl Salicylate (MUSCLE RUB EX) Apply 1 application topically daily as needed. Muscle pain    . nitroGLYCERIN (NITROSTAT) 0.4 MG SL tablet PLACE 1 TABLET UNDER THE TONGUE EVERY 5 MINUTES AS NEEDED FOR CHEST PAIN 75 tablet 1  . timolol (BETIMOL) 0.25 % ophthalmic solution Place 1 drop into both eyes 2 (two) times daily.     . isosorbide mononitrate (IMDUR) 30 MG 24 hr tablet Take 1 tablet (30 mg total)  by mouth daily. 90 tablet 3   No current facility-administered medications for this visit.    Allergies:   Zetia; Crestor; Pravachol; and Silver   Social History:  The patient  reports that he has never smoked. He has never used smokeless tobacco. He reports that he does not drink alcohol or use illicit drugs.   Family History:  The patient's  family history includes Cancer in his mother and sister; Diabetes in his mother; Hyperlipidemia in his mother; Stroke in his father.    ROS:  Please see the history of present illness. All other systems are reviewed and negative.    PHYSICAL EXAM: VS:  BP 114/70 mmHg  Pulse 42  Ht 6\' 1"  (1.854 m)  Wt 202 lb 1.9 oz (91.681 kg)  BMI 26.67 kg/m2 , BMI Body mass index is 26.67 kg/(m^2). GEN: Well nourished, well developed, in no acute distress HEENT: normal Neck: no JVD, no masses. No carotid bruits Cardiac: RRR without murmur or gallop                Respiratory:  clear to auscultation bilaterally, normal work of breathing GI: soft, nontender, nondistended, + BS MS: no deformity or atrophy Ext: no pretibial edema, pedal pulses 2+= bilaterally Skin: warm and dry, no rash Neuro:  Strength and sensation are intact Psych: euthymic mood, full affect  EKG:  EKG is not ordered today.  Recent Labs: 12/23/2015: BUN 13; Creatinine, Ser 0.97; Hemoglobin 11.5*; Platelets 160; Potassium 4.1; Sodium 140 02/08/2016: ALT 12   Lipid Panel     Component Value Date/Time   CHOL 232* 02/08/2016 1009   TRIG 91 02/08/2016 1009   HDL 48 02/08/2016 1009   CHOLHDL 4.8 02/08/2016 1009   VLDL 18 02/08/2016 1009   LDLCALC 166* 02/08/2016 1009   LDLDIRECT 173.2 07/21/2013 0809      Wt Readings from Last 3 Encounters:  02/18/16 202 lb 1.9 oz (91.681 kg)  02/08/16 201 lb 6.4 oz (91.354 kg)  01/07/16 203 lb 12.8 oz (92.443 kg)     Cardiac Studies Reviewed: Cardiac Cath April 2017: Conclusion     Prox LAD lesion, 90% stenosed. Post intervention, there  is a 0% residual stenosis.  1st Mrg-1 lesion, 60% stenosed.  Prox RCA lesion, 25% stenosed.  RPDA lesion, 95% stenosed.  Dist RCA lesion, 60% stenosed.  Lat 1st Diag lesion, 95% stenosed.  Dist LAD lesion, 90% stenosed.  1. Severe proximal LAD stenosis, treated successfully with PCI using a drug-eluting stent  2. Continued patency of the stented segment in the obtuse marginal branch with moderate stenosis of that branch before the stent, negative pressure wire analysis  3. Patent RCA with mild diffuse disease  4. Significant small vessel coronary artery disease with severe stenoses of the PDA branch, diagonal subbranch, and apical LAD, all appropriate for medical therapy  5. Normal LV function by echo  Recommend: Aggressive medical therapy for her residual  CAD, continue dual antiplatelet therapy with aspirin and Plavix.   ASSESSMENT AND PLAN: 1.  CAD, native vessel, with stable CCS class II angina: I reviewed the patient's cardiac catheterization films again. I do not think there are further options for PCI at this point. Certainly if he were to have progressive symptoms he would require repeat catheterization, but with his stable symptoms at present I would be inclined to continue with medical management. Metoprolol be discontinued because of marked sinus bradycardia. Will add isosorbide 15 mg daily for 1 week then 30 mg daily. I am going to arrange follow-up with Richardson Dopp in 6 weeks for further antianginal medication titration as needed.  2. Essential hypertension: Blood pressure is well controlled.  3. Marked sinus bradycardia: Discontinue metoprolol  4. Hyperlipidemia: statin-intolerant. PCSK9's are cost prohibitive. Exploring the Clear Trial. He would be very happy to participate in a trial for further CV risk reduction.  Current medicines are reviewed with the patient today.  The patient does not have concerns regarding medicines.  Labs/ tests ordered today include:    Orders Placed This Encounter  Procedures  . EKG 12-Lead    Disposition:   FU 6 weeks with Richardson Dopp, PA-C.   Deatra James, MD  02/18/2016 3:22 PM    [facility] [place], [place], [place]  [address] Phone: [phone]; Fax: [phone]

## 2016-03-14 ENCOUNTER — Telehealth: Payer: Self-pay | Admitting: *Deleted

## 2016-03-14 ENCOUNTER — Encounter (HOSPITAL_COMMUNITY): Payer: Self-pay | Admitting: Cardiology

## 2016-03-14 ENCOUNTER — Telehealth: Payer: Self-pay | Admitting: Cardiovascular Disease

## 2016-03-14 DIAGNOSIS — I10 Essential (primary) hypertension: Secondary | ICD-10-CM | POA: Diagnosis not present

## 2016-03-14 DIAGNOSIS — E7800 Pure hypercholesterolemia, unspecified: Secondary | ICD-10-CM | POA: Diagnosis not present

## 2016-03-14 DIAGNOSIS — I209 Angina pectoris, unspecified: Secondary | ICD-10-CM | POA: Diagnosis not present

## 2016-03-14 DIAGNOSIS — I252 Old myocardial infarction: Secondary | ICD-10-CM | POA: Diagnosis not present

## 2016-03-14 DIAGNOSIS — Z7902 Long term (current) use of antithrombotics/antiplatelets: Secondary | ICD-10-CM | POA: Diagnosis not present

## 2016-03-14 DIAGNOSIS — I739 Peripheral vascular disease, unspecified: Secondary | ICD-10-CM | POA: Diagnosis not present

## 2016-03-14 DIAGNOSIS — Z955 Presence of coronary angioplasty implant and graft: Secondary | ICD-10-CM | POA: Diagnosis not present

## 2016-03-14 DIAGNOSIS — E785 Hyperlipidemia, unspecified: Secondary | ICD-10-CM | POA: Diagnosis not present

## 2016-03-14 DIAGNOSIS — I2582 Chronic total occlusion of coronary artery: Secondary | ICD-10-CM | POA: Diagnosis not present

## 2016-03-14 DIAGNOSIS — I25119 Atherosclerotic heart disease of native coronary artery with unspecified angina pectoris: Secondary | ICD-10-CM | POA: Diagnosis not present

## 2016-03-14 DIAGNOSIS — Z7982 Long term (current) use of aspirin: Secondary | ICD-10-CM | POA: Diagnosis not present

## 2016-03-14 LAB — BASIC METABOLIC PANEL
Anion gap: 5 (ref 5–15)
BUN: 11 mg/dL (ref 6–20)
CALCIUM: 9.5 mg/dL (ref 8.9–10.3)
CO2: 29 mmol/L (ref 22–32)
CREATININE: 1.04 mg/dL (ref 0.61–1.24)
Chloride: 104 mmol/L (ref 101–111)
GFR calc Af Amer: >60 mL/min (ref >60)
Glucose, Bld: 95 mg/dL (ref 65–99)
POTASSIUM: 3.8 mmol/L (ref 3.5–5.1)
SODIUM: 138 mmol/L (ref 135–145)

## 2016-03-14 LAB — CBC
HCT: 36.2 % — ABNORMAL LOW (ref 39.0–52.0)
Hemoglobin: 12.3 g/dL — ABNORMAL LOW (ref 13.0–17.0)
MCH: 29.6 pg (ref 26.0–34.0)
MCHC: 34 g/dL (ref 30.0–36.0)
MCV: 87.2 fL (ref 78.0–100.0)
PLATELETS: 172 10*3/uL (ref 150–400)
RBC: 4.15 MIL/uL — ABNORMAL LOW (ref 4.22–5.81)
RDW: 12.3 % (ref 11.5–15.5)
WBC: 3.6 10*3/uL — AB (ref 4.0–10.5)

## 2016-03-14 MED FILL — Lidocaine HCl Local Preservative Free (PF) Inj 1%: INTRAMUSCULAR | Qty: 30 | Status: AC

## 2016-03-14 NOTE — Telephone Encounter (Signed)
New message     Per Rudene Christians at [facility] post-op Stent replacement, TCM appointment with Grandville Silos on 7/28@0830 

## 2016-03-14 NOTE — Care Management Note (Signed)
Case Management Note  Patient Details  Name: Austin [name] MRN: [id number] Date of Birth: Sep 17, 1945  Subjective/Objective:    Patient is s/p DES, form home for dc today,no needs, on plavix.                Action/Plan:   Expected Discharge Date:                  Expected Discharge Plan:  Home/Self Care  In-House Referral:     Discharge planning Services  CM Consult  Post Acute Care Choice:    Choice offered to:     DME Arranged:    DME Agency:     HH Arranged:    HH Agency:     Status of Service:  Completed, signed off  If discussed at [employer] of Stay Meetings, dates discussed:    Additional Comments:  Zenon Mayo, RN 03/14/2016, 3:15 PM

## 2016-03-14 NOTE — Discharge Instructions (Addendum)
°  REMEMBER TO FOLLOW UP WITH THE CHOLESTEROL STUDY IN THREE MONTHS.

## 2016-03-14 NOTE — Telephone Encounter (Signed)
Pt was on TCM list admitted for Angina & Ischemic chest pain. Pt d/c 03/13/16, and will f/u w/cardiology Dr. Burt Knack PA Angelena Form...Austin [name]

## 2016-03-14 NOTE — Discharge Summary (Signed)
Discharge Summary    Patient ID: DEANO PARELLO,  MRN: [id number], DOB/AGE: [birth year] 70 y.o.  Admit date: 03/13/2016 Discharge date: 03/14/2016  Primary Care Provider: Binnie Rail Primary Cardiologist: Dr. Burt Knack  Discharge Diagnoses    Active Problems:   Exertional angina [facility])   Ischemic chest pain (HCC)   Allergies Allergies  Allergen Reactions  . Zetia [Ezetimibe] Shortness Of Breath and Other (See Comments)    Chest pain  . Crestor [Rosuvastatin] Other (See Comments)    Myalgias  . Pravachol [Pravastatin Sodium] Other (See Comments)    Myalgias  . Isosorbide Mononitrate [Isosorbide Nitrate] Other (See Comments)    Headache, throat soreness and hoarse  . Silver Rash    Diagnostic Studies/Procedures    03/13/2016  Conclusion     Ost RPDA to RPDA lesion, 50% stenosed.  RPDA lesion, 80% stenosed.  Mid Cx lesion, 100% stenosed.  Lat 1st Diag lesion, 90% stenosed.  2nd Diag lesion, 90% stenosed.  Dist LAD lesion, 90% stenosed.  1st Mrg-2 lesion, 25% stenosed. The lesion was previously treated with a stent (unknown type) greater than two years ago.  1st Mrg-1 lesion, 95% stenosed. Post intervention, there is a 0% residual stenosis.  The left ventricular systolic function is normal.  1. 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 Continued patency of the recently placed proximal LAD stent 3. Severe small vessel CAD involving the distal PDA, diagonal branches of the LAD, and apical portion of the LAD 4. Hypokinesis of the anterolateral wall with preserved overall LV function, LVEF estimated at 55%   Coronary Diagrams    Diagnostic Diagram           Post-Intervention Diagram          _____________   History of Present Illness     Mr. Busscher is a 70 yo male patient of Dr. Burt Knack with PMH of CAD status post lateral wall MI in 2012 treated with PCI of the LCx with BMS, follicular  lymphoma, HTN, HL. Earlier this year, he saw Dr. Burt Knack with chest discomfort, dyspnea and marked exercise limitation. ETT-echo was abnormal with inducible ischemia in the inferolateral, lateral and anteroseptal walls. LHC in 4/17 demonstrated severe proximal LAD stenosis, continued patency of the stented segment in the OM branch with moderate stenosis of the branch before the stented segment and patent RCA with mild diffuse disease. FFR of the disease in the OM branch was not hemodynamically significant. He underwent PCI with a DES to the proximal LAD. He was last seen in the office by Dr. Burt Knack on 02/18/16 where he c/o exertional angina. Films were reviewed again and felt no options for PCI. BB was stopped 2/2 bradycardia. Isosorbide was added to medical regimen, with plans to follow up. He presented back to see Richardson Dopp on 03/09/16 c/o worsening chest discomfort after having to stop Isosorbide related to headaches. Plans were made to undergo LHC.  Hospital Course     Consultants: None  Mr. Buena Irish underwent a LHC with Dr. Burt Knack with DES placed to the first OM. Plans to continue with ASA and plavix. Morning labs were stable post procedure. He was ambulated with cardic rehab without any reports of angina or DOE. His radial cath site is stable with hematoma or bruising.   He was seen and examined by Dr. Martinique and determined stable for discharge home. I have arranged for TOC follow up in the office.  _____________  Discharge Vitals Blood pressure 130/80, pulse 58, temperature 97.7 F (36.5 C), temperature source Oral, resp. rate 21, height 6\' 1"  (1.854 m), weight 200 lb 9.9 oz (91 kg), SpO2 100 %.  Filed Weights   03/13/16 0707 03/14/16 0427  Weight: 200 lb (90.719 kg) 200 lb 9.9 oz (91 kg)    Labs & Radiologic Studies    CBC  Recent Labs  03/14/16 0419  WBC 3.6*  HGB 12.3*  HCT 36.2*  MCV 87.2  PLT Q000111Q   Basic Metabolic Panel  Recent Labs  03/14/16 0419  NA 138  K 3.8    CL 104  CO2 29  GLUCOSE 95  BUN 11  CREATININE 1.04  CALCIUM 9.5   Liver Function Tests No results for input(s): AST, ALT, ALKPHOS, BILITOT, PROT, ALBUMIN in the last 72 hours. No results for input(s): LIPASE, AMYLASE in the last 72 hours. Cardiac Enzymes No results for input(s): CKTOTAL, CKMB, CKMBINDEX, TROPONINI in the last 72 hours. BNP Invalid input(s): POCBNP D-Dimer No results for input(s): DDIMER in the last 72 hours. Hemoglobin A1C No results for input(s): HGBA1C in the last 72 hours. Fasting Lipid Panel No results for input(s): CHOL, HDL, LDLCALC, TRIG, CHOLHDL, LDLDIRECT in the last 72 hours. Thyroid Function Tests No results for input(s): TSH, T4TOTAL, T3FREE, THYROIDAB in the last 72 hours.  Invalid input(s): FREET3 _____________  No results found. Disposition   Pt is being discharged home today in good condition.  Follow-up Plans & Appointments    Follow-up Information    Follow up with Angelena Form, PA-C On 03/24/2016.   Specialties:  Cardiology, Radiology   Why:  8:30am with Katie, Dr. York Cerise PA for your hospital follow up.    Contact information:   [address] [place] [address] [phone]      Discharge Instructions    Amb Referral to Cardiac Rehabilitation    Complete by:  As directed   Diagnosis:  Coronary Stents     Diet - low sodium heart healthy    Complete by:  As directed      Discharge instructions    Complete by:  As directed   Radial Site Care Refer to this sheet in the next few weeks. These instructions provide you with information on caring for yourself after your procedure. Your caregiver may also give you more specific instructions. Your treatment has been planned according to current medical practices, but problems sometimes occur. Call your caregiver if you have any problems or questions after your procedure. HOME CARE INSTRUCTIONS You may shower the day after the procedure.Remove the bandage  (dressing) and gently wash the site with plain soap and water.Gently pat the site dry.  Do not apply powder or lotion to the site.  Do not submerge the affected site in water for 3 to 5 days.  Inspect the site at least twice daily.  Do not flex or bend the affected arm for 24 hours.  No lifting over 5 pounds (2.3 kg) for 5 days after your procedure.  Do not drive home if you are discharged the same day of the procedure. Have someone else drive you.  You may drive 24 hours after the procedure unless otherwise instructed by your caregiver.  What to expect: Any bruising will usually fade within 1 to 2 weeks.  Blood that collects in the tissue (hematoma) may be painful to the touch. It should usually decrease in size and tenderness within 1 to 2 weeks.  SEEK IMMEDIATE MEDICAL CARE IF: You have unusual pain at the radial site.  You have redness, warmth, swelling, or pain at the radial site.  You have drainage (other than a small amount of blood on the dressing).  You have chills.  You have a fever or persistent symptoms for more than 72 hours.  You have a fever and your symptoms suddenly get worse.  Your arm becomes pale, cool, tingly, or numb.  You have heavy bleeding from the site. Hold pressure on the site.     Increase activity slowly    Complete by:  As directed            Discharge Medications   Current Discharge Medication List    CONTINUE these medications which have NOT CHANGED   Details  amLODipine (NORVASC) 10 MG tablet Take 1 tablet (10 mg total) by mouth daily. Qty: 90 tablet, Refills: 3   Associated Diagnoses: Essential hypertension    aspirin 81 MG tablet Take 81 mg by mouth daily.    clopidogrel (PLAVIX) 75 MG tablet TAKE 1 TABLET(75 MG) BY MOUTH DAILY Qty: 90 tablet, Refills: 3   Associated Diagnoses: Coronary artery disease involving native coronary artery of native heart with other form of angina pectoris ([place]); Abnormal stress echo    dorzolamide-timolol  (COSOPT) 22.3-6.8 MG/ML ophthalmic solution Place 1 drop into both eyes 2 (two) times daily.    doxazosin (CARDURA) 2 MG tablet Take 1 tablet (2 mg total) by mouth daily. Qty: 90 tablet, Refills: 3    nitroGLYCERIN (NITROSTAT) 0.4 MG SL tablet PLACE 1 TABLET UNDER THE TONGUE EVERY 5 MINUTES AS NEEDED FOR CHEST PAIN Qty: 75 tablet, Refills: 1    timolol (BETIMOL) 0.25 % ophthalmic solution Place 1 drop into both eyes 2 (two) times daily.          Aspirin prescribed at discharge?  Yes High Intensity Statin Prescribed? (Lipitor 40-80mg  or Crestor 20-40mg ): No: Statin Intolerate Beta Blocker Prescribed? No: On Cosopt ophthalmic drops For EF <40%, was ACEI/ARB Prescribed? No: EF 55% ADP Receptor Inhibitor Prescribed? (i.e. Plavix etc.-Includes Medically Managed Patients): Yes For EF <40%, Aldosterone Inhibitor Prescribed? No: EF 55% Was EF assessed during THIS hospitalization? Yes Was Cardiac Rehab II ordered? (Included Medically managed Patients): Yes   Outstanding Labs/Studies   None  Duration of Discharge Encounter   Greater than 30 minutes including physician time.  Signed, Reino Bellis NP-C 03/14/2016, 11:06 AM

## 2016-03-14 NOTE — Research (Signed)
Patient Statin intolerant and interested in Research Cholesterol study CLEAR. Patient given Informed Consent Form to review at home. Instructed patient that part of the Inclusion/exclusion process will be 3 months post PCI. Patient given Business card with Research contact info and stated he will review and contact office if interested.

## 2016-03-14 NOTE — Progress Notes (Signed)
Patient Name: Austin [name] Date of Encounter: 03/14/2016  Hospital Problem List     Active Problems:   Exertional angina [facility])   Ischemic chest pain (HCC)    Subjective   Feeling well this morning.  Inpatient Medications    . amLODipine  10 mg Oral Daily  . aspirin EC  81 mg Oral Daily  . clopidogrel  75 mg Oral Daily  . dorzolamide-timolol  1 drop Both Eyes BID  . doxazosin  2 mg Oral Daily  . sodium chloride flush  3 mL Intravenous Q12H    Vital Signs    Filed Vitals:   03/13/16 2030 03/14/16 0427 03/14/16 0430 03/14/16 0736  BP: 124/68 125/67 125/67 122/74  Pulse:  72  72  Temp:  98.1 F (36.7 C)  98 F (36.7 C)  TempSrc:  Oral  Oral  Resp: 19 18 42 18  Height:      Weight:  200 lb 9.9 oz (91 kg)    SpO2:  96% 95% 99%    Intake/Output Summary (Last 24 hours) at 03/14/16 0821 Last data filed at 03/14/16 0742  Gross per 24 hour  Intake   1240 ml  Output    650 ml  Net    590 ml   Filed Weights   03/13/16 0707 03/14/16 0427  Weight: 200 lb (90.719 kg) 200 lb 9.9 oz (91 kg)    Physical Exam    General: Pleasant, NAD. Neuro: Alert and oriented X 3. Moves all extremities spontaneously. Psych: Normal affect. HEENT:  Normal  Neck: Supple without bruits or JVD. Lungs:  Resp regular and unlabored, CTA. Heart: RRR no s3, s4, or murmurs. Abdomen: Soft, non-tender, non-distended, BS + x 4.  Extremities: No clubbing, cyanosis or edema. DP/PT/Radials 2+ and equal bilaterally. Right radial site without hematoma or bruising.   Labs    CBC  Recent Labs  03/14/16 0419  WBC 3.6*  HGB 12.3*  HCT 36.2*  MCV 87.2  PLT Q000111Q   Basic Metabolic Panel  Recent Labs  03/14/16 0419  NA 138  K 3.8  CL 104  CO2 29  GLUCOSE 95  BUN 11  CREATININE 1.04  CALCIUM 9.5   Liver Function Tests No results for input(s): AST, ALT, ALKPHOS, BILITOT, PROT, ALBUMIN in the last 72 hours. No results for input(s): LIPASE, AMYLASE in the last 72 hours. Cardiac  Enzymes No results for input(s): CKTOTAL, CKMB, CKMBINDEX, TROPONINI in the last 72 hours. BNP Invalid input(s): POCBNP D-Dimer No results for input(s): DDIMER in the last 72 hours. Hemoglobin A1C No results for input(s): HGBA1C in the last 72 hours. Fasting Lipid Panel No results for input(s): CHOL, HDL, LDLCALC, TRIG, CHOLHDL, LDLDIRECT in the last 72 hours. Thyroid Function Tests No results for input(s): TSH, T4TOTAL, T3FREE, THYROIDAB in the last 72 hours.  Invalid input(s): FREET3  Telemetry    SR  ECG    SR with nonspecific T wave abnormality. PVC  Radiology    No results found.  Assessment & Plan    Mr. Regimbal is a 70 yo male patient of Dr. Burt Knack with PMH of CAD status post lateral wall MI in 2012 treated with PCI of the LCx with BMS, follicular lymphoma, HTN, HL. Earlier this year, he saw Dr. Burt Knack with chest discomfort, dyspnea and marked exercise limitation. ETT-echo was abnormal with inducible ischemia in the inferolateral, lateral and anteroseptal walls. LHC in 4/17 demonstrated severe proximal LAD stenosis, continued patency of the stented segment  in the OM branch with moderate stenosis of the branch before the stented segment and patent RCA with mild diffuse disease. FFR of the disease in the OM branch was not hemodynamically significant. He underwent PCI with a DES to the proximal LAD. He was last seen in the office by Dr. Burt Knack on 02/18/16 where he c/o exertional angina. Films were reviewed again and felt no options for PCI. BB was stopped 2/2 bradycardia. Isosorbide was added to medical regimen, with plans to follow up. He presented back to see Richardson Dopp on 03/09/16 c/o worsening chest discomfort after having to stop Isosorbide related to headaches. Plans were made to undergo LHC.   1. CAD: Had a LHC with Dr. Burt Knack yesterday with DES placed to the first OM.  Conclusion     Ost RPDA to RPDA lesion, 50% stenosed.  RPDA lesion, 80% stenosed.  Mid Cx  lesion, 100% stenosed.  Lat 1st Diag lesion, 90% stenosed.  2nd Diag lesion, 90% stenosed.  Dist LAD lesion, 90% stenosed.  1st Mrg-2 lesion, 25% stenosed. The lesion was previously treated with a stent (unknown type) greater than two years ago.  1st Mrg-1 lesion, 95% stenosed. Post intervention, there is a 0% residual stenosis.  The left ventricular systolic function is normal.  1. 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 Continued patency of the recently placed proximal LAD stent 3. Severe small vessel CAD involving the distal PDA, diagonal branches of the LAD, and apical portion of the LAD 4. Hypokinesis of the anterolateral wall with preserved overall LV function, LVEF estimated at 55%  -- Morning labs stable, Hgb 12.3 and Cr 1.04.  -- Reports ambulating this morning without any angina or dyspnea.  -- Will arrange for TOC follow up, will continue with ASA and Plavix  2. HTN: Controlled  3. HLD: Statin intolerant, considering the CLEAR study.   Signed, Reino Bellis NP-C Pager [phone] Patient seen and examined and history reviewed. Agree with above findings and plan. Patient is doing well this am. No chest pain or SOB. Radial site without hematoma. Discussed lifestyle modification. Intolerant of statins and Zetia. Planning to enroll in the CLEAR trial. He is stable for DC today.  Parris Signer [place], [place] 03/14/2016 9:58 AM

## 2016-03-14 NOTE — Progress Notes (Signed)
CARDIAC REHAB PHASE I   PRE:  Rate/Rhythm: 76 SR  BP:  Sitting: 151/65        SaO2: 100 RA  MODE:  Ambulation: 800 ft   POST:  Rate/Rhythm: 89 SR  BP:  Sitting: 149/75         SaO2: 100 RA  Pt ambulated 800 ft on RA, independent, steady gait, tolerated well with no complaints.  Completed PCI/stent education with pt and wife at bedside.  Reviewed risk factors, anti-platelet therapy, stent card, activity restrictions, ntg, exercise, heart healthy diet, and phase 2 cardiac rehab. Pt verbalized understanding, receptive to review of education (pt last seen in April 2017). Pt agrees to phase 2 cardiac rehab referral, although he states it is unlikely he will attend as he exercises regularly on his own, however, will send referral to [facility] per pt request. Pt to edge of bed after walk per pt request, call bell within reach.   [place], RN, BSN 03/14/2016 9:12 AM

## 2016-03-15 NOTE — Telephone Encounter (Signed)
Patient contacted regarding discharge from [facility] on July 18,2017  Patient understands to follow up with provider--Yes.  Angelena Form, PA on July 28,2017 at 8:30 Patient understands discharge instructions?  yes Patient understands medications and regiment? yes Patient understands to bring all medications to this visit?  yes

## 2016-03-24 ENCOUNTER — Encounter: Payer: Self-pay | Admitting: Physician Assistant

## 2016-03-24 ENCOUNTER — Other Ambulatory Visit: Payer: Self-pay | Admitting: Physician Assistant

## 2016-03-24 ENCOUNTER — Ambulatory Visit (INDEPENDENT_AMBULATORY_CARE_PROVIDER_SITE_OTHER): Payer: Commercial Managed Care - HMO | Admitting: Physician Assistant

## 2016-03-24 VITALS — BP 134/62 | HR 52 | Ht 73.0 in | Wt 201.0 lb

## 2016-03-24 DIAGNOSIS — I10 Essential (primary) hypertension: Secondary | ICD-10-CM

## 2016-03-24 DIAGNOSIS — I25118 Atherosclerotic heart disease of native coronary artery with other forms of angina pectoris: Secondary | ICD-10-CM

## 2016-03-24 DIAGNOSIS — E785 Hyperlipidemia, unspecified: Secondary | ICD-10-CM

## 2016-03-24 DIAGNOSIS — I25119 Atherosclerotic heart disease of native coronary artery with unspecified angina pectoris: Secondary | ICD-10-CM | POA: Diagnosis not present

## 2016-03-24 MED ORDER — NITROGLYCERIN 0.4 MG SL SUBL: SUBLINGUAL_TABLET | SUBLINGUAL | 3 refills | Status: DC

## 2016-03-24 MED ORDER — CLOPIDOGREL BISULFATE 75 MG PO TABS: ORAL_TABLET | ORAL | 3 refills | Status: DC

## 2016-03-24 NOTE — Patient Instructions (Signed)
Medication Instructions:  Your physician recommends that you continue on your current medications as directed. Please refer to the Current Medication list given to you today.   Labwork: None ordered  Testing/Procedures: None ordered  Follow-Up: Your physician recommends that you schedule a follow-up appointment in: [place]   Any Other Special Instructions Will Be Listed Below (If Applicable).     If you need a refill on your cardiac medications before your next appointment, please call your pharmacy.

## 2016-03-24 NOTE — Progress Notes (Signed)
Cardiology Office Note    Date:  03/24/2016   ID:  [name], DOB [birth year], MRN [id number]  PCP:  Binnie Rail, MD  Cardiologist:  Dr. Burt Knack  Chief Complaint: Hospital follow up s/p  Ischemic chest pain s/p stent placement   History of Present Illness:   Austin [name] is a 70 y.o. male CAD  follicular lymphoma, HTN, HL, and TIA who presented for hospital follow up.   Hx of CAD status post lateral wall MI in 2012 treated with PCI of the LCx with BMS. ETT-echo 12/08/15 was abnormal with inducible ischemia in the inferolateral, lateral and anteroseptal walls. LHC in 4/17 demonstrated severe proximal LAD stenosis, continued patency of the stented segment in the OM branch with moderate stenosis of the branch before the stented segment and patent RCA with mild diffuse disease. FFR of the disease in the OM branch was not hemodynamically significant. He underwent PCI with a DES to the proximal LAD.  The patient continued to have exertional angina when seen by Richardson Dopp 03/09/16. He had to stop isosorbide secondary headaches. BB stopped due to bradycardia. He underwent a outpatient LHC with Dr. Burt Knack with DES placed to the first OM. Patent proximal LAD stent and severe small vessel CAD. Also showed hypokinesis of the anterolateral wall with preserved LV function to 55%. Plans to continue with ASA and Plavix. He ambulated well and discharged.   Presents today for follow up. He is feeling much better. "Best time this year". He only have occasional chest tightness when he over exerts himself. The patient denies nausea, vomiting, fever,  palpitations, shortness of breath, orthopnea, PND, dizziness, syncope, cough, congestion, abdominal pain, hematochezia, melena, lower extremity edema.    Past Medical History:  Diagnosis Date  . Allergy   . Coronary artery disease    03/13/2016 DES to first OM, EF 55%  . Heart murmur    "outgrew it" (03/13/2016)  . HTN (hypertension)   . Hx of  cardiovascular stress test    ETT-Myoview (10/15): Nondiagnostic EKG changes, anterolateral and inferolateral scar, no ischemia, EF 48%, low risk  . Hyperlipemia   . Hyperlipidemia    NMR Lipoprofile 2008: LDL 168 ( 2410/ 1826), HDL 41, TG 71. LDL goal = <100, ideally <  70. Framingham Study LDL goal = < 130.  Marland Kitchen Kidney stones    "passed them"; Dr. Terance Hart (03/13/2016)  . Myocardial infarction ([place]) 2001  . Non Hodgkin's lymphoma ([place])    [facility]  . TIA (transient ischemic attack) 1980s   "I was exercising when I had it"    Past Surgical History:  Procedure Laterality Date  . CARDIAC CATHETERIZATION N/A 12/22/2015   Procedure: Left Heart Cath and Coronary Angiography;  Surgeon: Sherren Mocha, MD;  Location: [facility];  Service: Cardiovascular;  Laterality: N/A;  . CARDIAC CATHETERIZATION N/A 12/22/2015   Procedure: Coronary Stent Intervention;  Surgeon: Sherren Mocha, MD;  Location: [place] [facility];  Service: Cardiovascular;  Laterality: N/A;  . CARDIAC CATHETERIZATION N/A 12/22/2015   Procedure: Intravascular Pressure Wire/FFR Study;  Surgeon: Sherren Mocha, MD;  Location: [facility];  Service: Cardiovascular;  Laterality: N/A;  . CARDIAC CATHETERIZATION N/A 03/13/2016   Procedure: Left Heart Cath and Coronary Angiography;  Surgeon: Sherren Mocha, MD;  Location: [place] [facility];  Service: Cardiovascular;  Laterality: N/A;  . COLONOSCOPY  2002  . CORONARY ANGIOPLASTY WITH STENT PLACEMENT  ~ 2013   "1 stent"  .  ESOPHAGOGASTRODUODENOSCOPY (EGD) WITH ESOPHAGEAL DILATION  2002  . KNEE ARTHROSCOPY Left 1990s    Current Medications: Prior to Admission medications   Medication Sig Start Date End Date Taking? Authorizing Provider  amLODipine (NORVASC) 10 MG tablet Take 1 tablet (10 mg total) by mouth daily. 03/09/16  Yes Liliane Shi, PA-C  aspirin 81 MG tablet Take 81 mg by mouth daily.   Yes Historical Provider, MD  clopidogrel (PLAVIX) 75 MG tablet  TAKE 1 TABLET(75 MG) BY MOUTH DAILY 12/20/15  Yes Sherren Mocha, MD  dorzolamide-timolol (COSOPT) 22.3-6.8 MG/ML ophthalmic solution Place 1 drop into both eyes 2 (two) times daily. 12/15/15  Yes Historical Provider, MD  doxazosin (CARDURA) 2 MG tablet Take 1 tablet (2 mg total) by mouth daily. 07/02/15  Yes Sherren Mocha, MD  nitroGLYCERIN (NITROSTAT) 0.4 MG SL tablet PLACE 1 TABLET UNDER THE TONGUE EVERY 5 MINUTES AS NEEDED FOR CHEST PAIN 11/17/15  Yes Sherren Mocha, MD  timolol (BETIMOL) 0.25 % ophthalmic solution Place 1 drop into both eyes 2 (two) times daily.    Yes Historical Provider, MD    Allergies:   Zetia [ezetimibe]; Crestor [rosuvastatin]; Pravachol [pravastatin sodium]; Isosorbide mononitrate [isosorbide nitrate]; and Silver   Social History   Social History  . Marital status: Married    Spouse name: N/A  . Number of children: N/A  . Years of education: N/A   Social History Main Topics  . Smoking status: Never Smoker  . Smokeless tobacco: Never Used  . Alcohol use Yes     Comment: drank some in my 20's"  . Drug use: No  . Sexual activity: Yes   Other Topics Concern  . None   Social History Narrative  . None     Family History:  The patient's family history includes Cancer in his mother and sister; Diabetes in his mother; Hyperlipidemia in his mother; Stroke in his father.   ROS:   Please see the history of present illness.    ROS All other systems reviewed and are negative.   PHYSICAL EXAM:   VS:  BP 134/62   Pulse (!) 52   Ht 6\' 1"  (1.854 m)   Wt 201 lb (91.2 kg)   BMI 26.52 kg/m    GEN: Well nourished, well developed, in no acute distress  HEENT: normal  Neck: no JVD, carotid bruits, or masses Cardiac:RRR; no murmurs, rubs, or gallops,no edema. R radial cath site has 0.5cm hematoma, improving per patient,. No bruit Respiratory:  clear to auscultation bilaterally, normal work of breathing GI: soft, nontender, nondistended, + BS MS: no deformity or  atrophy  Skin: warm and dry, no rash Neuro:  Alert and Oriented x 3, Strength and sensation are intact Psych: euthymic mood, full affect  Wt Readings from Last 3 Encounters:  03/24/16 201 lb (91.2 kg)  03/14/16 200 lb 9.9 oz (91 kg)  03/09/16 200 lb 1.9 oz (90.8 kg)      Studies/Labs Reviewed:   EKG:  EKG is ordered today.  The ekg ordered today demonstrates SR at rate of 52 bpm. TWI lead I, II, aVL, and  lead V4 to V6.   TWI in lead V4 to V6 new compared to EKG of 03/14/16 and 03/09/16. However it was present to EKG of 02/21/16.   Recent Labs: 02/08/2016: ALT 12 03/14/2016: BUN 11; Creatinine, Ser 1.04; Hemoglobin 12.3; Platelets 172; Potassium 3.8; Sodium 138   Lipid Panel    Component Value Date/Time   CHOL 232 (H) 02/08/2016 1009  TRIG 91 02/08/2016 1009   HDL 48 02/08/2016 1009   CHOLHDL 4.8 02/08/2016 1009   VLDL 18 02/08/2016 1009   LDLCALC 166 (H) 02/08/2016 1009   LDLDIRECT 173.2 07/21/2013 0809    Additional studies/ records that were reviewed today include:   ETT-Echo 12/08/15 Impressions: Abnormal study after maximal exercise with reproduction of symptoms. Study suggests myocardial ischemia in the inferolateral and anteroseptal walls.   Cardiac Catheterization: 03/13/16 Left Heart Cath and Coronary Angiography  Conclusion    Ost RPDA to RPDA lesion, 50% stenosed.  RPDA lesion, 80% stenosed.  Mid Cx lesion, 100% stenosed.  Lat 1st Diag lesion, 90% stenosed.  2nd Diag lesion, 90% stenosed.  Dist LAD lesion, 90% stenosed.  1st Mrg-2 lesion, 25% stenosed. The lesion was previously treated with a stent (unknown type) greater than two years ago.  1st Mrg-1 lesion, 95% stenosed. Post intervention, there is a 0% residual stenosis.  The left ventricular systolic function is normal.   1. 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 Continued patency of the recently placed proximal LAD stent 3. Severe small  vessel CAD involving the distal PDA, diagonal branches of the LAD, and apical portion of the LAD 4. Hypokinesis of the anterolateral wall with preserved overall LV function, LVEF estimated at 55%       ASSESSMENT & PLAN:    1. CAD - Status post prior MI with stenting to the LCx and LHC in 4/17 with high-grade disease in the proximal LAD treated with DES. LHC 02/2016 with Dr. Burt Knack with DES placed to the first OM. Patent proximal LAD stent and severe small vessel CAD. Also showed hypokinesis of the anterolateral wall with preserved LV function to 55%.  - EKG with intermittent TWI as noted above. No chest pain except over exercise. Dicussed exercise regimen in detailed. Continue ASA, Plavix and amlodipine. Not on BB due to bradycardia and Imdur due to headache.   2. HTN - Stable and well controlled. Continue Norvasc.   3. HLD - 02/08/2016: Cholesterol 232; HDL 48; LDL Cholesterol 166; Triglycerides 91; VLDL 18  - He is followed in lipid clinic and being considered for CLEAR trial. Discussed diet in detailed.   4. Mild R radial hematoma - Improving. No bruit.    Medication Adjustments/Labs and Tests Ordered: Current medicines are reviewed at length with the patient today.  Concerns regarding medicines are outlined above.  Medication changes, Labs and Tests ordered today are listed in the Patient Instructions below. Patient Instructions  Medication Instructions:  Your physician recommends that you continue on your current medications as directed. Please refer to the Current Medication list given to you today.   Labwork: None ordered  Testing/Procedures: None ordered  Follow-Up: Your physician recommends that you schedule a follow-up appointment in: [place]   Any Other Special Instructions Will Be Listed Below (If Applicable).     If you need a refill on your cardiac medications before your next appointment, please call your pharmacy.       Jarrett Soho, [place]  03/24/2016 9:05 AM    [address], [place], [place]  [address] Phone: [phone]; Fax: [phone]

## 2016-03-29 ENCOUNTER — Ambulatory Visit: Payer: Commercial Managed Care - HMO | Admitting: Physician Assistant

## 2016-03-31 ENCOUNTER — Ambulatory Visit: Payer: Commercial Managed Care - HMO | Admitting: Physician Assistant

## 2016-04-17 ENCOUNTER — Other Ambulatory Visit: Payer: Self-pay | Admitting: Cardiovascular Disease

## 2016-04-18 NOTE — Telephone Encounter (Signed)
Metoprolol d/c 02/18/16 per Burt Knack

## 2016-04-25 ENCOUNTER — Encounter: Payer: Self-pay | Admitting: Cardiovascular Disease

## 2016-05-11 ENCOUNTER — Ambulatory Visit (INDEPENDENT_AMBULATORY_CARE_PROVIDER_SITE_OTHER): Payer: Commercial Managed Care - HMO | Admitting: Cardiovascular Disease

## 2016-05-11 ENCOUNTER — Encounter: Payer: Self-pay | Admitting: Cardiovascular Disease

## 2016-05-11 VITALS — BP 126/84 | HR 59 | Ht 73.0 in | Wt 200.0 lb

## 2016-05-11 DIAGNOSIS — E785 Hyperlipidemia, unspecified: Secondary | ICD-10-CM

## 2016-05-11 DIAGNOSIS — I10 Essential (primary) hypertension: Secondary | ICD-10-CM

## 2016-05-11 NOTE — Progress Notes (Signed)
Cardiology Office Note Date:  05/11/2016   ID:  WLLIAM PASINI, DOB 11/30/1945, MRN [id number]  PCP:  Binnie Rail, MD  Cardiologist:  Sherren Mocha, MD    Chief Complaint  Patient presents with  . Follow-up    CAD   History of Present Illness: Austin [name] is a 70 y.o. male who presents for Follow-up of coronary artery disease. The patient initially presented with a lateral wall MI in 2012 and was treated with PCI the left circumflex. He presented with progressive anginal symptoms in April 2017 and underwent cardiac catheterization showing severe proximal LAD stenosis and continued patency of the stented segment in the obtuse marginal branch the circumflex. There was moderate disease noted. PCI of the LAD was performed. Anginal symptoms then worsened and repeat catheterization was done. This demonstrated a new plaque rupture in the left circumflex with critical stenosis. A drug-eluting stent was placed. LVEF was preserved at 55% there was hypokinesis of the anterolateral wall.  The patient is doing very well. States that he feels very good and better than he has a long time. He is able to exercise regularly without any exertional angina. He denies shortness of breath, heart palpitations, or other complaints. He has lost weight. He's tolerating his medications well without side effects.   Past Medical History:  Diagnosis Date  . Allergy   . Coronary artery disease    03/13/2016 DES to first OM, EF 55%  . Heart murmur    "outgrew it" (03/13/2016)  . HTN (hypertension)   . Hx of cardiovascular stress test    ETT-Myoview (10/15): Nondiagnostic EKG changes, anterolateral and inferolateral scar, no ischemia, EF 48%, low risk  . Hyperlipemia   . Hyperlipidemia    NMR Lipoprofile 2008: LDL 168 ( 2410/ 1826), HDL 41, TG 71. LDL goal = <100, ideally <  70. Framingham Study LDL goal = < 130.  Marland Kitchen Kidney stones    "passed them"; Dr. Terance Hart (03/13/2016)  . Myocardial infarction  ([place]) 2001  . Non Hodgkin's lymphoma ([place])    [facility]  . TIA (transient ischemic attack) 1980s   "I was exercising when I had it"    Past Surgical History:  Procedure Laterality Date  . CARDIAC CATHETERIZATION N/A 12/22/2015   Procedure: Left Heart Cath and Coronary Angiography;  Surgeon: Sherren Mocha, MD;  Location: [facility];  Service: Cardiovascular;  Laterality: N/A;  . CARDIAC CATHETERIZATION N/A 12/22/2015   Procedure: Coronary Stent Intervention;  Surgeon: Sherren Mocha, MD;  Location: [place] [facility];  Service: Cardiovascular;  Laterality: N/A;  . CARDIAC CATHETERIZATION N/A 12/22/2015   Procedure: Intravascular Pressure Wire/FFR Study;  Surgeon: Sherren Mocha, MD;  Location: [facility];  Service: Cardiovascular;  Laterality: N/A;  . CARDIAC CATHETERIZATION N/A 03/13/2016   Procedure: Left Heart Cath and Coronary Angiography;  Surgeon: Sherren Mocha, MD;  Location: [facility];  Service: Cardiovascular;  Laterality: N/A;  . COLONOSCOPY  2002  . CORONARY ANGIOPLASTY WITH STENT PLACEMENT  ~ 2013   "1 stent"  . ESOPHAGOGASTRODUODENOSCOPY (EGD) WITH ESOPHAGEAL DILATION  2002  . KNEE ARTHROSCOPY Left 1990s    Current Outpatient Prescriptions  Medication Sig Dispense Refill  . amLODipine (NORVASC) 10 MG tablet Take 1 tablet (10 mg total) by mouth daily. 90 tablet 3  . aspirin 81 MG tablet Take 81 mg by mouth daily.    . clopidogrel (PLAVIX) 75 MG tablet TAKE 1 TABLET(75 MG) BY MOUTH DAILY 90  tablet 3  . dorzolamide-timolol (COSOPT) 22.3-6.8 MG/ML ophthalmic solution Place 1 drop into both eyes 2 (two) times daily.    Marland Kitchen doxazosin (CARDURA) 2 MG tablet Take 1 tablet (2 mg total) by mouth daily. 90 tablet 3  . nitroGLYCERIN (NITROSTAT) 0.4 MG SL tablet PLACE AND DISSOLVE 1 TABLET UNDER THE TONGUE EVERY 5 MINUTES AS NEEDED FOR CHEST PAIN 75 tablet 3  . timolol (BETIMOL) 0.25 % ophthalmic solution Place 1 drop into both eyes 2 (two) times daily.        No current facility-administered medications for this visit.     Allergies:   Zetia [ezetimibe]; Crestor [rosuvastatin]; Pravachol [pravastatin sodium]; Isosorbide mononitrate [isosorbide nitrate]; and Silver   Social History:  The patient  reports that he has never smoked. He has never used smokeless tobacco. He reports that he drinks alcohol. He reports that he does not use drugs.   Family History:  The patient's family history includes Cancer in his mother and sister; Diabetes in his mother; Hyperlipidemia in his mother; Stroke in his father.    ROS:  Please see the history of present illness.  Otherwise, review of systems is positive for muscle pain, leg pain.  All other systems are reviewed and negative.    PHYSICAL EXAM: VS:  BP 126/84   Pulse (!) 59   Ht 6\' 1"  (1.854 m)   Wt 90.7 kg (200 lb)   SpO2 98%   BMI 26.39 kg/m  , BMI Body mass index is 26.39 kg/m. GEN: Well nourished, well developed, in no acute distress  HEENT: normal  Neck: no JVD, no masses. No carotid bruits Cardiac: RRR without murmur or gallop                Respiratory:  clear to auscultation bilaterally, normal work of breathing GI: soft, nontender, nondistended, + BS MS: no deformity or atrophy  Ext: no pretibial edema, pedal pulses 2+= bilaterally Skin: warm and dry, no rash Neuro:  Strength and sensation are intact Psych: euthymic mood, full affect  EKG:  EKG is not ordered today.  Recent Labs: 02/08/2016: ALT 12 03/14/2016: BUN 11; Creatinine, Ser 1.04; Hemoglobin 12.3; Platelets 172; Potassium 3.8; Sodium 138   Lipid Panel     Component Value Date/Time   CHOL 232 (H) 02/08/2016 1009   TRIG 91 02/08/2016 1009   HDL 48 02/08/2016 1009   CHOLHDL 4.8 02/08/2016 1009   VLDL 18 02/08/2016 1009   LDLCALC 166 (H) 02/08/2016 1009   LDLDIRECT 173.2 07/21/2013 0809      Wt Readings from Last 3 Encounters:  05/11/16 90.7 kg (200 lb)  03/24/16 91.2 kg (201 lb)  03/14/16 91 kg (200 lb 9.9 oz)      Cardiac Studies Reviewed: Cardiac Cath 03-13-2016: Conclusion    Ost RPDA to RPDA lesion, 50% stenosed.  RPDA lesion, 80% stenosed.  Mid Cx lesion, 100% stenosed.  Lat 1st Diag lesion, 90% stenosed.  2nd Diag lesion, 90% stenosed.  Dist LAD lesion, 90% stenosed.  1st Mrg-2 lesion, 25% stenosed. The lesion was previously treated with a stent (unknown type) greater than two years ago.  1st Mrg-1 lesion, 95% stenosed. Post intervention, there is a 0% residual stenosis.  The left ventricular systolic function is normal.   1. 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 Continued patency of the recently placed proximal LAD stent 3. Severe small vessel CAD involving the distal PDA, diagonal branches of the  LAD, and apical portion of the LAD 4. Hypokinesis of the anterolateral wall with preserved overall LV function, LVEF estimated at 55%      ASSESSMENT AND PLAN: 1.  CAD, native vessel, without angina: The patient is doing well after PCI. I'm inclined to keep him on long-term dual antiplatelet therapy and less he has bleeding problems. He's had multiple ACS/ischemic events and has required drug-eluting stent implantation now on 3 separate occasions. He is currently doing very well. I would recommend follow-up in 6 months.  2. Hypercholesterolemia: Statin intolerant. He is planning to enroll in the Clear Trial.  3. HTN: BP controlled on amlodipine  Current medicines are reviewed with the patient today.  The patient does not have concerns regarding medicines.  Labs/ tests ordered today include:  No orders of the defined types were placed in this encounter.  Disposition:   FU 6 months with Richardson Dopp, PA-C.   Deatra James, MD  05/11/2016 9:57 AM    [facility] [address], [place], [place]  [address] Phone: [phone]; Fax: [phone]

## 2016-05-11 NOTE — Patient Instructions (Signed)
Medication Instructions:  Your physician recommends that you continue on your current medications as directed. Please refer to the Current Medication list given to you today.  Labwork: No new orders.  Testing/Procedures: No new orders.   Follow-Up: Your physician recommends that you schedule a follow-up appointment in: 6 MONTHS with Scott Weaver PA-C  Your physician wants you to follow-up in: 1 YEAR with Dr Cooper.  You will receive a reminder letter in the mail two months in advance. If you don't receive a letter, please call our office to schedule the follow-up appointment.   Any Other Special Instructions Will Be Listed Below (If Applicable).     If you need a refill on your cardiac medications before your next appointment, please call your pharmacy.   

## 2016-05-26 DIAGNOSIS — H401123 Primary open-angle glaucoma, left eye, severe stage: Secondary | ICD-10-CM | POA: Diagnosis not present

## 2016-05-26 DIAGNOSIS — H16223 Keratoconjunctivitis sicca, not specified as Sjogren's, bilateral: Secondary | ICD-10-CM | POA: Diagnosis not present

## 2016-08-02 DIAGNOSIS — C8298 Follicular lymphoma, unspecified, lymph nodes of multiple sites: Secondary | ICD-10-CM | POA: Diagnosis not present

## 2016-08-02 DIAGNOSIS — Z7982 Long term (current) use of aspirin: Secondary | ICD-10-CM | POA: Diagnosis not present

## 2016-08-02 DIAGNOSIS — Z923 Personal history of irradiation: Secondary | ICD-10-CM | POA: Diagnosis not present

## 2016-08-02 DIAGNOSIS — M1990 Unspecified osteoarthritis, unspecified site: Secondary | ICD-10-CM | POA: Diagnosis not present

## 2016-08-02 DIAGNOSIS — Z7902 Long term (current) use of antithrombotics/antiplatelets: Secondary | ICD-10-CM | POA: Diagnosis not present

## 2016-08-02 DIAGNOSIS — Z79899 Other long term (current) drug therapy: Secondary | ICD-10-CM | POA: Diagnosis not present

## 2016-08-02 DIAGNOSIS — Z9221 Personal history of antineoplastic chemotherapy: Secondary | ICD-10-CM | POA: Diagnosis not present

## 2016-08-02 DIAGNOSIS — I2510 Atherosclerotic heart disease of native coronary artery without angina pectoris: Secondary | ICD-10-CM | POA: Diagnosis not present

## 2016-08-02 DIAGNOSIS — C9110 Chronic lymphocytic leukemia of B-cell type not having achieved remission: Secondary | ICD-10-CM | POA: Diagnosis not present

## 2016-08-15 DIAGNOSIS — M25562 Pain in left knee: Secondary | ICD-10-CM | POA: Diagnosis not present

## 2016-08-28 ENCOUNTER — Observation Stay (HOSPITAL_COMMUNITY): Payer: Medicare HMO

## 2016-08-28 ENCOUNTER — Inpatient Hospital Stay (HOSPITAL_COMMUNITY)
Admission: EM | Admit: 2016-08-28 | Discharge: 2016-08-31 | DRG: 194 | Disposition: A | Discharge status: Home / Self Care | Payer: Medicare HMO | Attending: Internal Medicine | Admitting: Internal Medicine

## 2016-08-28 ENCOUNTER — Emergency Department (HOSPITAL_COMMUNITY): Payer: Medicare HMO

## 2016-08-28 ENCOUNTER — Encounter (HOSPITAL_COMMUNITY): Payer: Self-pay | Admitting: *Deleted

## 2016-08-28 DIAGNOSIS — J189 Pneumonia, unspecified organism: Secondary | ICD-10-CM | POA: Diagnosis not present

## 2016-08-28 DIAGNOSIS — I25119 Atherosclerotic heart disease of native coronary artery with unspecified angina pectoris: Secondary | ICD-10-CM | POA: Diagnosis present

## 2016-08-28 DIAGNOSIS — I10 Essential (primary) hypertension: Secondary | ICD-10-CM | POA: Diagnosis present

## 2016-08-28 DIAGNOSIS — Z801 Family history of malignant neoplasm of trachea, bronchus and lung: Secondary | ICD-10-CM | POA: Diagnosis not present

## 2016-08-28 DIAGNOSIS — Z8673 Personal history of transient ischemic attack (TIA), and cerebral infarction without residual deficits: Secondary | ICD-10-CM | POA: Diagnosis not present

## 2016-08-28 DIAGNOSIS — N179 Acute kidney failure, unspecified: Secondary | ICD-10-CM | POA: Diagnosis present

## 2016-08-28 DIAGNOSIS — I25118 Atherosclerotic heart disease of native coronary artery with other forms of angina pectoris: Secondary | ICD-10-CM | POA: Diagnosis present

## 2016-08-28 DIAGNOSIS — E785 Hyperlipidemia, unspecified: Secondary | ICD-10-CM | POA: Diagnosis present

## 2016-08-28 DIAGNOSIS — R319 Hematuria, unspecified: Secondary | ICD-10-CM | POA: Diagnosis not present

## 2016-08-28 DIAGNOSIS — N390 Urinary tract infection, site not specified: Secondary | ICD-10-CM | POA: Diagnosis present

## 2016-08-28 DIAGNOSIS — R739 Hyperglycemia, unspecified: Secondary | ICD-10-CM | POA: Diagnosis present

## 2016-08-28 DIAGNOSIS — Z833 Family history of diabetes mellitus: Secondary | ICD-10-CM | POA: Diagnosis not present

## 2016-08-28 DIAGNOSIS — I252 Old myocardial infarction: Secondary | ICD-10-CM

## 2016-08-28 DIAGNOSIS — J90 Pleural effusion, not elsewhere classified: Secondary | ICD-10-CM | POA: Diagnosis present

## 2016-08-28 DIAGNOSIS — Z888 Allergy status to other drugs, medicaments and biological substances status: Secondary | ICD-10-CM

## 2016-08-28 DIAGNOSIS — J181 Lobar pneumonia, unspecified organism: Secondary | ICD-10-CM

## 2016-08-28 DIAGNOSIS — Z823 Family history of stroke: Secondary | ICD-10-CM

## 2016-08-28 DIAGNOSIS — Z7982 Long term (current) use of aspirin: Secondary | ICD-10-CM

## 2016-08-28 DIAGNOSIS — D649 Anemia, unspecified: Secondary | ICD-10-CM | POA: Diagnosis present

## 2016-08-28 DIAGNOSIS — Z955 Presence of coronary angioplasty implant and graft: Secondary | ICD-10-CM | POA: Diagnosis not present

## 2016-08-28 DIAGNOSIS — R7303 Prediabetes: Secondary | ICD-10-CM | POA: Diagnosis present

## 2016-08-28 DIAGNOSIS — R0600 Dyspnea, unspecified: Secondary | ICD-10-CM | POA: Diagnosis not present

## 2016-08-28 DIAGNOSIS — Z8572 Personal history of non-Hodgkin lymphomas: Secondary | ICD-10-CM | POA: Diagnosis not present

## 2016-08-28 DIAGNOSIS — Z7902 Long term (current) use of antithrombotics/antiplatelets: Secondary | ICD-10-CM | POA: Diagnosis not present

## 2016-08-28 DIAGNOSIS — Z96652 Presence of left artificial knee joint: Secondary | ICD-10-CM | POA: Diagnosis present

## 2016-08-28 DIAGNOSIS — Z79899 Other long term (current) drug therapy: Secondary | ICD-10-CM

## 2016-08-28 DIAGNOSIS — I2510 Atherosclerotic heart disease of native coronary artery without angina pectoris: Secondary | ICD-10-CM | POA: Diagnosis present

## 2016-08-28 HISTORY — DX: Pleural effusion, not elsewhere classified: J90

## 2016-08-28 HISTORY — DX: Hematuria, unspecified: R31.9

## 2016-08-28 HISTORY — DX: Pneumonia, unspecified organism: J18.9

## 2016-08-28 LAB — URINALYSIS, ROUTINE W REFLEX MICROSCOPIC
Bilirubin Urine: NEGATIVE
GLUCOSE, UA: NEGATIVE mg/dL
Ketones, ur: NEGATIVE mg/dL
LEUKOCYTES UA: NEGATIVE
NITRITE: POSITIVE — AB
PH: 5 (ref 5.0–8.0)
Protein, ur: 100 mg/dL — AB
Specific Gravity, Urine: 1.017 (ref 1.005–1.030)

## 2016-08-28 LAB — TROPONIN I
TROPONIN I: 0.05 ng/mL — AB (ref <0.03)
Troponin I: 0.08 ng/mL (ref <0.03)

## 2016-08-28 LAB — BASIC METABOLIC PANEL
Anion gap: 10 (ref 5–15)
BUN: 29 mg/dL — AB (ref 6–20)
CO2: 25 mmol/L (ref 22–32)
Calcium: 9.2 mg/dL (ref 8.9–10.3)
Chloride: 96 mmol/L — ABNORMAL LOW (ref 101–111)
Creatinine, Ser: 2.03 mg/dL — ABNORMAL HIGH (ref 0.61–1.24)
GFR calc Af Amer: 36 mL/min — ABNORMAL LOW (ref >60)
GFR, EST NON AFRICAN AMERICAN: 31 mL/min — AB (ref >60)
Glucose, Bld: 150 mg/dL — ABNORMAL HIGH (ref 65–99)
POTASSIUM: 3.7 mmol/L (ref 3.5–5.1)
SODIUM: 131 mmol/L — AB (ref 135–145)

## 2016-08-28 LAB — CBC
HEMATOCRIT: 33.8 % — AB (ref 39.0–52.0)
Hemoglobin: 12 g/dL — ABNORMAL LOW (ref 13.0–17.0)
MCH: 30.5 pg (ref 26.0–34.0)
MCHC: 35.5 g/dL (ref 30.0–36.0)
MCV: 86 fL (ref 78.0–100.0)
Platelets: 163 10*3/uL (ref 150–400)
RBC: 3.93 MIL/uL — ABNORMAL LOW (ref 4.22–5.81)
RDW: 12.9 % (ref 11.5–15.5)
WBC: 16.2 10*3/uL — AB (ref 4.0–10.5)

## 2016-08-28 LAB — LACTIC ACID, PLASMA
LACTIC ACID, VENOUS: 1.3 mmol/L (ref 0.5–1.9)
Lactic Acid, Venous: 1.3 mmol/L (ref 0.5–1.9)

## 2016-08-28 LAB — INFLUENZA PANEL BY PCR (TYPE A & B)
INFLBPCR: NEGATIVE
Influenza A By PCR: NEGATIVE

## 2016-08-28 LAB — GLUCOSE, CAPILLARY
GLUCOSE-CAPILLARY: 155 mg/dL — AB (ref 65–99)
GLUCOSE-CAPILLARY: 175 mg/dL — AB (ref 65–99)

## 2016-08-28 LAB — STREP PNEUMONIAE URINARY ANTIGEN: Strep Pneumo Urinary Antigen: NEGATIVE

## 2016-08-28 MED ORDER — BENZONATATE 100 MG PO CAPS: 200.0000 mg | ORAL_CAPSULE | Freq: Two times a day (BID) | ORAL | Status: DC | PRN

## 2016-08-28 MED ORDER — DORZOLAMIDE HCL-TIMOLOL MAL 2-0.5 % OP SOLN: 1.0000 [drp] | Freq: Two times a day (BID) | OPHTHALMIC | Status: DC

## 2016-08-28 MED ORDER — ALBUTEROL SULFATE (2.5 MG/3ML) 0.083% IN NEBU
2.5000 mg | INHALATION_SOLUTION | RESPIRATORY_TRACT | Status: DC | PRN
  Administered 2016-08-31: 2.5 mg via RESPIRATORY_TRACT
  Filled 2016-08-28: qty 3

## 2016-08-28 MED ORDER — BENZONATATE 100 MG PO CAPS
200.0000 mg | ORAL_CAPSULE | Freq: Two times a day (BID) | ORAL | Status: DC | PRN
  Administered 2016-08-29 (×2): 200 mg via ORAL
  Filled 2016-08-28 (×3): qty 2

## 2016-08-28 MED ORDER — ONDANSETRON HCL 4 MG PO TABS: 4.0000 mg | ORAL_TABLET | Freq: Four times a day (QID) | ORAL | Status: DC | PRN

## 2016-08-28 MED ORDER — CEFTRIAXONE SODIUM 1 G IJ SOLR
1.0000 g | Freq: Once | INTRAMUSCULAR | Status: AC
  Administered 2016-08-28: 1 g via INTRAVENOUS
  Filled 2016-08-28: qty 10

## 2016-08-28 MED ORDER — TIMOLOL HEMIHYDRATE 0.25 % OP SOLN: 1.0000 [drp] | Freq: Two times a day (BID) | OPHTHALMIC | Status: DC

## 2016-08-28 MED ORDER — NITROGLYCERIN 0.4 MG SL SUBL: 0.4000 mg | SUBLINGUAL_TABLET | SUBLINGUAL | Status: DC | PRN

## 2016-08-28 MED ORDER — SODIUM CHLORIDE 0.9 % IV BOLUS (SEPSIS)
1000.0000 mL | Freq: Once | INTRAVENOUS | Status: AC
  Administered 2016-08-28: 1000 mL via INTRAVENOUS

## 2016-08-28 MED ORDER — SODIUM CHLORIDE 0.9 % IV SOLN: INTRAVENOUS | Status: DC

## 2016-08-28 MED ORDER — HYDROCODONE-ACETAMINOPHEN 5-325 MG PO TABS: 1.0000 | ORAL_TABLET | ORAL | Status: DC | PRN

## 2016-08-28 MED ORDER — DORZOLAMIDE HCL-TIMOLOL MAL 2-0.5 % OP SOLN
1.0000 [drp] | Freq: Two times a day (BID) | OPHTHALMIC | Status: DC
  Filled 2016-08-28 (×2): qty 10

## 2016-08-28 MED ORDER — SODIUM CHLORIDE 0.9 % IV SOLN
INTRAVENOUS | Status: DC
  Administered 2016-08-28: 18:00:00 via INTRAVENOUS
  Administered 2016-08-29: 100 mL/h via INTRAVENOUS

## 2016-08-28 MED ORDER — BENZONATATE 100 MG PO CAPS
100.0000 mg | ORAL_CAPSULE | Freq: Once | ORAL | Status: AC
  Administered 2016-08-28: 100 mg via ORAL
  Filled 2016-08-28: qty 1

## 2016-08-28 MED ORDER — HYDROCODONE-HOMATROPINE 5-1.5 MG/5ML PO SYRP: 5.0000 mL | ORAL_SOLUTION | ORAL | Status: DC | PRN

## 2016-08-28 MED ORDER — CLOPIDOGREL BISULFATE 75 MG PO TABS
75.0000 mg | ORAL_TABLET | Freq: Every day | ORAL | Status: DC
  Administered 2016-08-28 – 2016-08-31 (×4): 75 mg via ORAL
  Filled 2016-08-28 (×4): qty 1

## 2016-08-28 MED ORDER — GUAIFENESIN 100 MG/5ML PO SOLN
5.0000 mL | ORAL | Status: DC | PRN
  Administered 2016-08-29 – 2016-08-31 (×3): 100 mg via ORAL
  Filled 2016-08-28 (×3): qty 5

## 2016-08-28 MED ORDER — ASPIRIN EC 81 MG PO TBEC
81.0000 mg | DELAYED_RELEASE_TABLET | Freq: Every day | ORAL | Status: DC
  Administered 2016-08-28 – 2016-08-31 (×4): 81 mg via ORAL
  Filled 2016-08-28 (×4): qty 1

## 2016-08-28 MED ORDER — IPRATROPIUM-ALBUTEROL 0.5-2.5 (3) MG/3ML IN SOLN
3.0000 mL | Freq: Once | RESPIRATORY_TRACT | Status: AC
  Administered 2016-08-28: 3 mL via RESPIRATORY_TRACT
  Filled 2016-08-28: qty 3

## 2016-08-28 MED ORDER — SENNOSIDES-DOCUSATE SODIUM 8.6-50 MG PO TABS: 1.0000 | ORAL_TABLET | Freq: Every evening | ORAL | Status: DC | PRN

## 2016-08-28 MED ORDER — DOXAZOSIN MESYLATE 2 MG PO TABS
2.0000 mg | ORAL_TABLET | Freq: Every day | ORAL | Status: DC
  Administered 2016-08-28 – 2016-08-31 (×4): 2 mg via ORAL
  Filled 2016-08-28 (×4): qty 1

## 2016-08-28 MED ORDER — DEXTROSE 5 % IV SOLN
1.0000 g | INTRAVENOUS | Status: DC
  Administered 2016-08-29 – 2016-08-30 (×2): 1 g via INTRAVENOUS
  Filled 2016-08-28 (×3): qty 10

## 2016-08-28 MED ORDER — INSULIN ASPART 100 UNIT/ML ~~LOC~~ SOLN
0.0000 [IU] | Freq: Three times a day (TID) | SUBCUTANEOUS | Status: DC
  Administered 2016-08-29 – 2016-08-30 (×4): 1 [IU] via SUBCUTANEOUS

## 2016-08-28 MED ORDER — AZITHROMYCIN 250 MG PO TABS
500.0000 mg | ORAL_TABLET | ORAL | Status: DC
  Administered 2016-08-29 – 2016-08-30 (×2): 500 mg via ORAL
  Filled 2016-08-28 (×4): qty 2

## 2016-08-28 MED ORDER — ALBUTEROL SULFATE (2.5 MG/3ML) 0.083% IN NEBU
2.5000 mg | INHALATION_SOLUTION | Freq: Four times a day (QID) | RESPIRATORY_TRACT | Status: DC
  Administered 2016-08-28: 2.5 mg via RESPIRATORY_TRACT
  Filled 2016-08-28: qty 3

## 2016-08-28 MED ORDER — METHYLPREDNISOLONE SODIUM SUCC 125 MG IJ SOLR
60.0000 mg | Freq: Once | INTRAMUSCULAR | Status: AC
  Administered 2016-08-28: 60 mg via INTRAVENOUS
  Filled 2016-08-28: qty 2

## 2016-08-28 MED ORDER — SODIUM CHLORIDE 0.9% FLUSH
3.0000 mL | Freq: Two times a day (BID) | INTRAVENOUS | Status: DC
  Administered 2016-08-28 – 2016-08-31 (×6): 3 mL via INTRAVENOUS

## 2016-08-28 MED ORDER — ACETAMINOPHEN 650 MG RE SUPP: 650.0000 mg | Freq: Four times a day (QID) | RECTAL | Status: DC | PRN

## 2016-08-28 MED ORDER — DEXTROSE 5 % IV SOLN
500.0000 mg | Freq: Once | INTRAVENOUS | Status: AC
  Administered 2016-08-28: 500 mg via INTRAVENOUS
  Filled 2016-08-28: qty 500

## 2016-08-28 MED ORDER — HEPARIN SODIUM (PORCINE) 5000 UNIT/ML IJ SOLN
5000.0000 [IU] | Freq: Three times a day (TID) | INTRAMUSCULAR | Status: DC
  Administered 2016-08-28 – 2016-08-31 (×8): 5000 [IU] via SUBCUTANEOUS
  Filled 2016-08-28 (×8): qty 1

## 2016-08-28 MED ORDER — ONDANSETRON HCL 4 MG/2ML IJ SOLN: 4.0000 mg | Freq: Four times a day (QID) | INTRAMUSCULAR | Status: DC | PRN

## 2016-08-28 MED ORDER — ACETAMINOPHEN 325 MG PO TABS
650.0000 mg | ORAL_TABLET | Freq: Four times a day (QID) | ORAL | Status: DC | PRN
Start: 2016-08-28 — End: 2016-08-31

## 2016-08-28 MED ORDER — METHYLPREDNISOLONE SODIUM SUCC 125 MG IJ SOLR
60.0000 mg | Freq: Two times a day (BID) | INTRAMUSCULAR | Status: AC
  Administered 2016-08-28 – 2016-08-30 (×4): 60 mg via INTRAVENOUS
  Filled 2016-08-28 (×4): qty 2

## 2016-08-28 NOTE — ED Provider Notes (Signed)
[facility] DEPT Provider Note   CSN: [id number] Arrival date & time: 08/28/16  1149     History   Chief Complaint Chief Complaint  Patient presents with  . Abdominal Pain  . Weakness  . Emesis    HPI Austin [name] is a 71 y.o. male history of hypertension, MI, TIA, presenting with worsening nonproductive cough, body aches, and nausea 4 days. Patient reports worsening symptoms with associated chills, and centralized chest pain, worse with cough. Patient states that it is chest pain is related to his persistent cough. Patient states that he has tried tea and over-the-counter cough medicine without relief. Patient reports one episode of vomiting 2 days ago without evidence of blood. Patient states this is the first time this happens to him. Patient denies recent contacts. Patient denies smoking, asthma, COPD. Patient denies being hospitalized within the last 90 days. Patient admits to hematuria yesterday. He states that he often has to hold it in his urine because it takes so long for him to get to the toilet. He states this happens often. He says he try something over-the-counter helps him with that. Patient denies dysuria, urgency, and frequency today to me. Patient denies diarrhea, any changes in bowel movements.   HPI  Past Medical History:  Diagnosis Date  . Allergy   . Coronary artery disease    03/13/2016 DES to first OM, EF 55%  . Heart murmur    "outgrew it" (03/13/2016)  . HTN (hypertension)   . Hx of cardiovascular stress test    ETT-Myoview (10/15): Nondiagnostic EKG changes, anterolateral and inferolateral scar, no ischemia, EF 48%, low risk  . Hyperlipemia   . Hyperlipidemia    NMR Lipoprofile 2008: LDL 168 ( 2410/ 1826), HDL 41, TG 71. LDL goal = <100, ideally <  70. Framingham Study LDL goal = < 130.  Marland Kitchen Kidney stones    "passed them"; Dr. Terance Hart (03/13/2016)  . Myocardial infarction 2001  . Non Hodgkin's [facility])    [facility]  . TIA  (transient ischemic attack) 1980s   "I was exercising when I had it"    Patient Active Problem List   Diagnosis Date Noted  . CAP (community acquired pneumonia) 08/28/2016  . AKI (acute kidney injury) ([place]) 08/28/2016  . Hyperglycemia 08/28/2016  . Pneumonia 08/28/2016  . Ischemic chest pain ([place]) 03/13/2016  . Exertional angina ([place]) 12/22/2015  . Coronary artery disease 12/13/2010  . Hyperlipidemia 11/12/2010  . ALLERGIC RHINITIS 11/12/2010  . Anemia 12/28/2009  . THROMBOCYTOPENIA 12/28/2009  . 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 Essential hypertension 11/26/2008  . NEPHROLITHIASIS, HX OF 06/26/2007  . 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 CARDIAC CATHETERIZATION N/A 12/22/2015   Procedure: Left Heart Cath and Coronary Angiography;  Surgeon: Sherren Mocha, MD;  Location: [facility] LAB;  Service: Cardiovascular;  Laterality: N/A;  . CARDIAC CATHETERIZATION N/A 12/22/2015   Procedure: Coronary Stent Intervention;  Surgeon: Sherren Mocha, MD;  Location: [facility] LAB;  Service: Cardiovascular;  Laterality: N/A;  . CARDIAC CATHETERIZATION N/A 12/22/2015   Procedure: Intravascular Pressure Wire/FFR Study;  Surgeon: Sherren Mocha, MD;  Location: [facility];  Service: Cardiovascular;  Laterality: N/A;  . CARDIAC CATHETERIZATION N/A 03/13/2016   Procedure: Left Heart Cath and Coronary Angiography;  Surgeon: Sherren Mocha, MD;  Location: [facility];  Service: Cardiovascular;  Laterality: N/A;  . COLONOSCOPY  2002  .  CORONARY ANGIOPLASTY WITH STENT PLACEMENT  ~ 2013   "1 stent"  . ESOPHAGOGASTRODUODENOSCOPY (EGD) WITH ESOPHAGEAL DILATION  2002  . KNEE ARTHROSCOPY Left 1990s       Home Medications    Prior to Admission medications   Medication Sig Start Date End Date Taking? Authorizing Provider  amLODipine (NORVASC) 10 MG tablet Take 1 tablet (10 mg total) by mouth daily.  03/09/16  Yes Liliane Shi, PA-C  aspirin 81 MG tablet Take 81 mg by mouth daily.   Yes Historical Provider, MD  clopidogrel (PLAVIX) 75 MG tablet TAKE 1 TABLET(75 MG) BY MOUTH DAILY 03/24/16  Yes Bhavinkumar Bhagat, PA  dorzolamide-timolol (COSOPT) 22.3-6.8 MG/ML ophthalmic solution Place 1 drop into both eyes 2 (two) times daily. 12/15/15  Yes Historical Provider, MD  doxazosin (CARDURA) 2 MG tablet Take 1 tablet (2 mg total) by mouth daily. 07/02/15  Yes Sherren Mocha, MD  timolol (BETIMOL) 0.25 % ophthalmic solution Place 1 drop into both eyes 2 (two) times daily.    Yes Historical Provider, MD  nitroGLYCERIN (NITROSTAT) 0.4 MG SL tablet PLACE AND DISSOLVE 1 TABLET UNDER THE TONGUE EVERY 5 MINUTES AS NEEDED FOR CHEST PAIN 03/24/16   Sherren Mocha, MD    Family History Family History  Problem Relation Age of Onset  . Cancer Mother     lung cancer  . Diabetes Mother   . Hyperlipidemia Mother   . Stroke Father     age 3  . Cancer Sister     ?lymphoma    Social History Social History  Substance Use Topics  . Smoking status: Never Smoker  . Smokeless tobacco: Never Used  . Alcohol use Yes     Comment: drank some in my 20's"     Allergies   Zetia [ezetimibe]; Crestor [rosuvastatin]; Pravachol [pravastatin sodium]; Isosorbide mononitrate [isosorbide nitrate]; and Silver   Review of Systems Review of Systems  Constitutional: Positive for chills. Negative for fever.  HENT: Negative for trouble swallowing.   Respiratory: Positive for cough, shortness of breath and wheezing.   Cardiovascular: Positive for chest pain.  Gastrointestinal: Positive for abdominal pain, nausea and vomiting. Negative for blood in stool, constipation and diarrhea.  Endocrine: Negative for polyuria.  Genitourinary: Positive for hematuria. Negative for difficulty urinating and dysuria.  Musculoskeletal: Positive for arthralgias and myalgias. Negative for neck stiffness.  Skin: Negative for rash and  wound.  Neurological: Negative for speech difficulty and numbness.     Physical Exam Updated Vital Signs BP 131/68   Pulse 103   Temp 99.9 F (37.7 C) (Oral)   Resp 25   SpO2 94%   Physical Exam  Constitutional: He is oriented to person, place, and time. He appears well-developed and well-nourished.  HENT:  Head: Normocephalic and atraumatic.  Nose: Nose normal.  Mouth/Throat: Oropharynx is clear and moist.  Eyes: Conjunctivae and EOM are normal. Pupils are equal, round, and reactive to light.  Neck: Normal range of motion. Neck supple.  Cardiovascular: Normal rate and normal heart sounds.   Pulmonary/Chest: Effort normal. No respiratory distress. He has wheezes. He has rales. He exhibits no tenderness.  Abdominal: Soft. Bowel sounds are normal. There is tenderness (epigastric area). There is no rebound and no guarding.  Musculoskeletal: Normal range of motion.  Neurological: He is alert and oriented to person, place, and time.  Skin: Skin is warm. Capillary refill takes less than 2 seconds.  Psychiatric: He has a normal mood and affect. His behavior is normal.  Nursing  note and vitals reviewed.    ED Treatments / Results  Labs (all labs ordered are listed, but only abnormal results are displayed) Labs Reviewed  BASIC METABOLIC PANEL - Abnormal; Notable for the following:       Result Value   Sodium 131 (*)    Chloride 96 (*)    Glucose, Bld 150 (*)    BUN 29 (*)    Creatinine, Ser 2.03 (*)    GFR calc non Af Amer 31 (*)    GFR calc Af Amer 36 (*)    All other components within normal limits  CBC - Abnormal; Notable for the following:    WBC 16.2 (*)    RBC 3.93 (*)    Hemoglobin 12.0 (*)    HCT 33.8 (*)    All other components within normal limits  URINALYSIS, ROUTINE W REFLEX MICROSCOPIC - Abnormal; Notable for the following:    Color, Urine AMBER (*)    APPearance CLOUDY (*)    Hgb urine dipstick LARGE (*)    Protein, ur 100 (*)    Nitrite POSITIVE (*)     Bacteria, UA MANY (*)    Squamous Epithelial / LPF 0-5 (*)    All other components within normal limits  CULTURE, BLOOD (ROUTINE X 2)  CULTURE, BLOOD (ROUTINE X 2)  LACTIC ACID, PLASMA  LACTIC ACID, PLASMA  INFLUENZA PANEL BY PCR (TYPE A & B, H1N1)    EKG  EKG Interpretation  Date/Time:  Monday August 28 2016 14:03:15 EST Ventricular Rate:  106 PR Interval:    QRS Duration: 88 QT Interval:  315 QTC Calculation: 419 R Axis:   13 Text Interpretation:  Sinus tachycardia Lateral infarct, acute Confirmed by Lacinda Axon  MD, BRIAN ([id number]) on 08/28/2016 2:47:08 PM       Radiology Dg Chest 2 View  Result Date: 08/28/2016 CLINICAL DATA:  Pt came to ED for eval of general weakness, cough and n/v for the past 3 days. States he started 'passing blood clots in urine' 2 days ago. C/o slight cp but thinks this is due to cough. States he woke one night with fever. Hx of.*comment was truncated* EXAM: CHEST  2 VIEW COMPARISON:  Stable enlarged cardiac silhouette. Chronic elevation RIGHT hemidiaphragm. Increased atelectasis in fluid at the RIGHT lung base. The lateral projection there is consolidation within the RIGHT middle lobe. FINDINGS: The heart size and mediastinal contours are within normal limits. Both lungs are clear. The visualized skeletal structures are unremarkable. IMPRESSION: Concern for RIGHT middle lobe pneumonia. RIGHT pleural effusion and chronic elevation RIGHT hemidiaphragm Followup PA and lateral chest X-ray is recommended in 3-4 weeks following trial of antibiotic therapy to ensure resolution and exclude underlying malignancy. Electronically Signed   By: Suzy Bouchard M.D.   On: 08/28/2016 13:26    Procedures Procedures (including critical care time)  Medications Ordered in ED Medications  azithromycin (ZITHROMAX) 500 mg in dextrose 5 % 250 mL IVPB (500 mg Intravenous New Bag/Given 08/28/16 1435)  cefTRIAXone (ROCEPHIN) 1 g in dextrose 5 % 50 mL IVPB (0 g Intravenous Stopped 08/28/16  1505)  sodium chloride 0.9 % bolus 1,000 mL (0 mLs Intravenous Stopped 08/28/16 1601)  ipratropium-albuterol (DUONEB) 0.5-2.5 (3) MG/3ML nebulizer solution 3 mL (3 mLs Nebulization Given 08/28/16 1505)  methylPREDNISolone sodium succinate (SOLU-MEDROL) 125 mg/2 mL injection 60 mg (60 mg Intravenous Given 08/28/16 1502)  benzonatate (TESSALON) capsule 100 mg (100 mg Oral Given 08/28/16 1504)     Initial Impression / Assessment  and Plan / ED Course  I have reviewed the triage vital signs and the nursing notes.  Pertinent labs & imaging results that were available during my care of the patient were reviewed by me and considered in my medical decision making (see chart for details).  Clinical Course   Patient is a 71 year old male presenting with worsening cough, general weakness, nausea, and one episode of vomiting for the last 4 days. On exam Patient's temperature at 99.9, mildly tachycardic, mildly tachypneic. Patient coughing throughout most of exam, with no sputum. Heart sounds clear. Wheezing, and Rales heard on lung auscultation. Abdomen soft and mildly tender in epigastric region. Lab results show increase in white count to 16.2. Creatinine is increased to 2.03. BUN increased at 29. GFR 36. Urinalysis also shows evidence for urinary tract infection.   Chest x-ray shows concern for right middle lobe pneumonia, right pleural effusion and a chronic elevation of right hemidiaphragm. Antibiotics will be given to treat both, pneumonia and UTI.   Patient given fluids. DuoNeb nebulizer, Solu-Medrol Tessalon for cough. And started on inpatient CAP antibiotics.  I spoke with [facility] Representative and patient will be admitted to telemetry under Dr. Linna Darner.  Final Clinical Impressions(s) / ED Diagnoses   Final diagnoses:  Community acquired pneumonia of right middle lobe of lung ([place])  Pleural effusion    New Prescriptions New Prescriptions   No medications on file     [place], [place] 08/28/16 [place], MD 08/30/16 [phone]

## 2016-08-28 NOTE — H&P (Signed)
History and Physical    Austin [name] [id number] DOB: May 16, 1946 DOA: 08/28/2016  PCP: Binnie Rail, MD Patient coming from: home  Chief Complaint:   HPI: Austin [name] is a very pleasant 71 y.o. male with medical history significant for TIA on Plavix, MI 2001, hyperlipidemia, hypertension, CAD, non-Hodgkin's lymphoma in remission tends to the emergency department with chief complaint of 4 day history of generalized weakness intermittent fever/chills, intermittent hematuria worsening cough, nausea without emesis. Initial evaluation reveals community-acquired pneumonia possible urinary tract infection.  Information is obtained from the patient and his wife who is at the bedside. He reports 4 day history generalized body aches nausea without emesis persistent nonproductive cough. Associated symptoms include subjective fever chills chest pain with coughing located substernal nonradiating. Denies palpitations lower extremity edema or orthopnea. Denies dysuria or frequency but does endorse some urgency. He states 2 days ago he developed hematuria with clots. He denies abdominal pain diarrhea constipation melena or bright red blood per rectum.    ED Course: In the emergency department temperature is 99.9 he's tachycardia mild tachypnea not hypoxic. He is provided with 1 L normal saline and antibiotics  Review of Systems: As per HPI otherwise 10 point review of systems negative.   Ambulatory Status: Ambulates independently no recent falls  Past Medical History:  Diagnosis Date  . Allergy   . CAP (community acquired pneumonia)   . Coronary artery disease    03/13/2016 DES to first OM, EF 55%  . Heart murmur    "outgrew it" (03/13/2016)  . Hematuria   . HTN (hypertension)   . Hx of cardiovascular stress test    ETT-Myoview (10/15): Nondiagnostic EKG changes, anterolateral and inferolateral scar, no ischemia, EF 48%, low risk  . Hyperlipemia   . Hyperlipidemia    NMR Lipoprofile  2008: LDL 168 ( 2410/ 1826), HDL 41, TG 71. LDL goal = <100, ideally <  70. Framingham Study LDL goal = < 130.  Marland Kitchen Kidney stones    "passed them"; Dr. Terance Hart (03/13/2016)  . Myocardial infarction 2001  . Non Hodgkin's lymphoma ([place])    [facility]  . Pleural effusion   . TIA (transient ischemic attack) 1980s   "I was exercising when I had it"    Past Surgical History:  Procedure Laterality Date  . CARDIAC CATHETERIZATION N/A 12/22/2015   Procedure: Left Heart Cath and Coronary Angiography;  Surgeon: Sherren Mocha, MD;  Location: [facility];  Service: Cardiovascular;  Laterality: N/A;  . CARDIAC CATHETERIZATION N/A 12/22/2015   Procedure: Coronary Stent Intervention;  Surgeon: Sherren Mocha, MD;  Location: [facility];  Service: Cardiovascular;  Laterality: N/A;  . CARDIAC CATHETERIZATION N/A 12/22/2015   Procedure: Intravascular Pressure Wire/FFR Study;  Surgeon: Sherren Mocha, MD;  Location: [facility];  Service: Cardiovascular;  Laterality: N/A;  . CARDIAC CATHETERIZATION N/A 03/13/2016   Procedure: Left Heart Cath and Coronary Angiography;  Surgeon: Sherren Mocha, MD;  Location: [facility];  Service: Cardiovascular;  Laterality: N/A;  . COLONOSCOPY  2002  . CORONARY ANGIOPLASTY WITH STENT PLACEMENT  ~ 2013   "1 stent"  . ESOPHAGOGASTRODUODENOSCOPY (EGD) WITH ESOPHAGEAL DILATION  2002  . KNEE ARTHROSCOPY Left 1990s    Social History   Social History  . Marital status: Married    Spouse name: N/A  . Number of children: N/A  . Years of education: N/A   Occupational History  . Not on file.   Social History Main  Topics  . Smoking status: Never Smoker  . Smokeless tobacco: Never Used  . Alcohol use Yes     Comment: drank some in my 20's"  . Drug use: No  . Sexual activity: Yes   Other Topics Concern  . Not on file   Social History Narrative  . No narrative on file  Lives at home with his wife he is a retired Higher education careers adviser  Allergies   Allergen Reactions  . Zetia [Ezetimibe] Shortness Of Breath and Other (See Comments)    Chest pain  . Crestor [Rosuvastatin] Other (See Comments)    Myalgias  . Pravachol [Pravastatin Sodium] Other (See Comments)    Myalgias  . Isosorbide Mononitrate [Isosorbide Nitrate] Other (See Comments)    Headache, throat soreness and hoarse  . Silver Rash    Family History  Problem Relation Age of Onset  . Cancer Mother     lung cancer  . Diabetes Mother   . Hyperlipidemia Mother   . Stroke Father     age 27  . Cancer Sister     ?lymphoma    Prior to Admission medications   Medication Sig Start Date End Date Taking? Authorizing Provider  amLODipine (NORVASC) 10 MG tablet Take 1 tablet (10 mg total) by mouth daily. 03/09/16  Yes Liliane Shi, PA-C  aspirin 81 MG tablet Take 81 mg by mouth daily.   Yes Historical Provider, MD  clopidogrel (PLAVIX) 75 MG tablet TAKE 1 TABLET(75 MG) BY MOUTH DAILY 03/24/16  Yes Bhavinkumar Bhagat, PA  dorzolamide-timolol (COSOPT) 22.3-6.8 MG/ML ophthalmic solution Place 1 drop into both eyes 2 (two) times daily. 12/15/15  Yes Historical Provider, MD  doxazosin (CARDURA) 2 MG tablet Take 1 tablet (2 mg total) by mouth daily. 07/02/15  Yes Sherren Mocha, MD  timolol (BETIMOL) 0.25 % ophthalmic solution Place 1 drop into both eyes 2 (two) times daily.    Yes Historical Provider, MD  nitroGLYCERIN (NITROSTAT) 0.4 MG SL tablet PLACE AND DISSOLVE 1 TABLET UNDER THE TONGUE EVERY 5 MINUTES AS NEEDED FOR CHEST PAIN 03/24/16   Sherren Mocha, MD    Physical Exam: Vitals:   08/28/16 1445 08/28/16 1500 08/28/16 1515 08/28/16 1535  BP: 134/78 139/77 140/79 131/68  Pulse: 105 102 103 103  Resp: 21 (!) 29 24 25   Temp:      TempSrc:      SpO2: 96% 93% 94% 94%     General:  Appears calm and Somewhat uncomfortable and acutely ill but not toxic Eyes:  PERRL, EOMI, normal lids, iris ENT:  grossly normal hearing, lips & tongue, because membranes of his mouth are pink  slightly dry Neck:  no LAD, masses or thyromegaly Cardiovascular:  Tachycardic but regular, no m/r/g. No LE edema.  Respiratory:  Mild increased work of breathing with conversation breath sounds are quite diminished throughout faint end expiratory wheeze faint bilateral crackles Abdomen:  soft, ntnd, positive bowel sounds but sluggish Skin:  no rash or induration seen on limited exam Musculoskeletal:  grossly normal tone BUE/BLE, good ROM, no bony abnormality Psychiatric:  grossly normal mood and affect, speech fluent and appropriate, AOx3 Neurologic:  CN 2-12 grossly intact, moves all extremities in coordinated fashion, sensation intact  Labs on Admission: I have personally reviewed following labs and imaging studies  CBC:  Recent Labs Lab 08/28/16 1328  WBC 16.2*  HGB 12.0*  HCT 33.8*  MCV 86.0  PLT [id number]   Basic Metabolic Panel:  Recent Labs Lab 08/28/16 1328  NA  131*  K 3.7  CL 96*  CO2 25  GLUCOSE 150*  BUN 29*  CREATININE 2.03*  CALCIUM 9.2   GFR: CrCl cannot be calculated (Unknown ideal weight.). Liver Function Tests: No results for input(s): AST, ALT, ALKPHOS, BILITOT, PROT, ALBUMIN in the last 168 hours. No results for input(s): LIPASE, AMYLASE in the last 168 hours. No results for input(s): AMMONIA in the last 168 hours. Coagulation Profile: No results for input(s): INR, PROTIME in the last 168 hours. Cardiac Enzymes: No results for input(s): CKTOTAL, CKMB, CKMBINDEX, TROPONINI in the last 168 hours. BNP (last 3 results) No results for input(s): PROBNP in the last 8760 hours. HbA1C: No results for input(s): HGBA1C in the last 72 hours. CBG: No results for input(s): GLUCAP in the last 168 hours. Lipid Profile: No results for input(s): CHOL, HDL, LDLCALC, TRIG, CHOLHDL, LDLDIRECT in the last 72 hours. Thyroid Function Tests: No results for input(s): TSH, T4TOTAL, FREET4, T3FREE, THYROIDAB in the last 72 hours. Anemia Panel: No results for input(s):  VITAMINB12, FOLATE, FERRITIN, TIBC, IRON, RETICCTPCT in the last 72 hours. Urine analysis:    Component Value Date/Time   COLORURINE AMBER (A) 08/28/2016 1241   APPEARANCEUR CLOUDY (A) 08/28/2016 1241   LABSPEC 1.017 08/28/2016 1241   PHURINE 5.0 08/28/2016 1241   GLUCOSEU NEGATIVE 08/28/2016 1241   HGBUR LARGE (A) 08/28/2016 1241   HGBUR large 12/15/2009 1149   BILIRUBINUR NEGATIVE 08/28/2016 1241   BILIRUBINUR Neg 05/10/2012 1706   KETONESUR NEGATIVE 08/28/2016 1241   PROTEINUR 100 (A) 08/28/2016 1241   UROBILINOGEN 0.2 05/10/2012 1706   UROBILINOGEN 0.2 12/15/2009 1149   NITRITE POSITIVE (A) 08/28/2016 1241   LEUKOCYTESUR NEGATIVE 08/28/2016 1241    Creatinine Clearance: CrCl cannot be calculated (Unknown ideal weight.).  Sepsis Labs: @LABRCNTIP (procalcitonin:4,lacticidven:4) )No results found for this or any previous visit (from the past 240 hour(s)).   Radiological Exams on Admission: Dg Chest 2 View  Result Date: 08/28/2016 CLINICAL DATA:  Pt came to ED for eval of general weakness, cough and n/v for the past 3 days. States he started 'passing blood clots in urine' 2 days ago. C/o slight cp but thinks this is due to cough. States he woke one night with fever. Hx of.*comment was truncated* EXAM: CHEST  2 VIEW COMPARISON:  Stable enlarged cardiac silhouette. Chronic elevation RIGHT hemidiaphragm. Increased atelectasis in fluid at the RIGHT lung base. The lateral projection there is consolidation within the RIGHT middle lobe. FINDINGS: The heart size and mediastinal contours are within normal limits. Both lungs are clear. The visualized skeletal structures are unremarkable. IMPRESSION: Concern for RIGHT middle lobe pneumonia. RIGHT pleural effusion and chronic elevation RIGHT hemidiaphragm Followup PA and lateral chest X-ray is recommended in 3-4 weeks following trial of antibiotic therapy to ensure resolution and exclude underlying malignancy. Electronically Signed   By: Suzy Bouchard M.D.   On: 08/28/2016 13:26    EKG: Independently reviewed. Sinus tachycardia   Assessment/Plan Principal Problem:   CAP (community acquired pneumonia) Active Problems:   Anemia   Essential hypertension   Hematuria   Coronary artery disease   AKI (acute kidney injury) ([facility])   Hyperglycemia   Pleural effusion   #1. Community-acquired pneumonia. Chest x-ray concerning for right middle lobe pneumonia and reveals a right pleural effusion. Patient is not hypoxic, leukocytosis of 16.2, mildly tachycardic mild tachypnea. -Admit -Follow blood culture -Obtain sputum culture as able -Strep pneumo urine antigen -Influenza panel -Follow lactic acid -IV antibiotics per protocol -IV fluids -Oxygen  supplementation as indicated -Solu-Medrol -Scheduled nebs -Antitussive -Monitor  #2. Hematuria. 4 day history. Describes some urgency issues.  Patient denies trauma. Home medications does include Plavix. Hemoglobin 12.0. Hx lymphoma -Monitor urine output -Monitor hemoglobin -IV fluids -Renal ultrasound -Hold Plavix for now -If no improvement consider inpatient urology consult vs OP  #3. Chest pain. Likely related to coughing. Patient with a history of CAD status post lateral MI. Initial troponin negative. EKG as noted above. -Cycle troponin -Antitussive -Monitor  4. Acute kidney injury. Creatinine 2.03. Likely related to decreased oral intake secondary to above -IV fluids as noted above -Hold nephrotoxins -Renal ultrasound -Monitor urine output -Recheck in the morning  #5. Hypertension. Controlled in the emergency department Home medications include amlodipine and cardura. -Hold amlodipine for now -continue cardura -Resume amlodipine when indicated  #6. Hyperglycemia. Serum glucose 150 on admission. Expect it to continue to rise secondary to Solu-Medrol -Car modified diet -Obtain a hemoglobin A1c -Sliding scale for optimal control  #7. Pleural effusion per xray.  Report Concern for RIGHT middle lobe pneumonia. RIGHT pleural effusion and chronic elevation RIGHT hemidiaphragm Followup PA and lateral chest X-ray is recommended in 3-4 weeks following trial of antibiotic therapy to ensure resolution and exclude underlying malignancy. She is not hypoxic -OP follow up     DVT prophylaxis: scd  Code Status: full  Family Communication: wife at bedside  Disposition Plan: home  Consults called: none  Admission status: obs    Radene Gunning MD [facility]  If 7PM-7AM, please contact night-coverage [email or web address] Password [email or web address]  08/28/2016, 5:30 PM

## 2016-08-28 NOTE — Progress Notes (Signed)
Called ED for report. Nurse is busy and said she will call back. Austin [name]

## 2016-08-28 NOTE — Progress Notes (Signed)
Patient admitted from E.D with community acquired pneumonia and UTI. Alert and Oriented X4, placed on tele box 15, droplet precautions started, oriented to room, call bell and phone system. Skin intact. Will follow out MD orders. Elias Else D

## 2016-08-28 NOTE — Progress Notes (Addendum)
CRITICAL VALUE ALERT  Critical value received:  Troponin 0.05  Date of notification: 08/28/2016  Time of notification:  6:55 PM   Critical value read back:Yes.    Nurse who received alert:  Elias Else D       MD notified (1st page):  Dr. Marily Memos  Time of first page:  6:58 PM    MD notified (2nd page): Levi Aland, NP -- Done by Verdene Lennert (Night shift Nurse during report)  Time of second page: 7:34 PM   Responding [id number] Hospitalist Schorr NP  Time MD responded: [id number]

## 2016-08-28 NOTE — ED Notes (Signed)
Pt given water to drink per Pilar Plate, [place]

## 2016-08-28 NOTE — ED Triage Notes (Signed)
To ED for eval of general weakness, n/v for the past 3 days. States he started 'passing blood clots in urine' 2 days ago. C/o slight cp but thinks this is due to cough. States he woke one night with fever

## 2016-08-28 NOTE — ED Triage Notes (Signed)
ECG done and shown to Dr Tamera Punt. No STEMI ordered

## 2016-08-29 ENCOUNTER — Observation Stay (HOSPITAL_COMMUNITY): Payer: Medicare HMO

## 2016-08-29 DIAGNOSIS — N179 Acute kidney failure, unspecified: Secondary | ICD-10-CM

## 2016-08-29 DIAGNOSIS — Z79899 Other long term (current) drug therapy: Secondary | ICD-10-CM | POA: Diagnosis not present

## 2016-08-29 DIAGNOSIS — Z955 Presence of coronary angioplasty implant and graft: Secondary | ICD-10-CM | POA: Diagnosis not present

## 2016-08-29 DIAGNOSIS — J189 Pneumonia, unspecified organism: Principal | ICD-10-CM

## 2016-08-29 DIAGNOSIS — Z8673 Personal history of transient ischemic attack (TIA), and cerebral infarction without residual deficits: Secondary | ICD-10-CM | POA: Diagnosis not present

## 2016-08-29 DIAGNOSIS — J90 Pleural effusion, not elsewhere classified: Secondary | ICD-10-CM | POA: Diagnosis present

## 2016-08-29 DIAGNOSIS — Z888 Allergy status to other drugs, medicaments and biological substances status: Secondary | ICD-10-CM | POA: Diagnosis not present

## 2016-08-29 DIAGNOSIS — Z8572 Personal history of non-Hodgkin lymphomas: Secondary | ICD-10-CM | POA: Diagnosis not present

## 2016-08-29 DIAGNOSIS — D649 Anemia, unspecified: Secondary | ICD-10-CM | POA: Diagnosis present

## 2016-08-29 DIAGNOSIS — R739 Hyperglycemia, unspecified: Secondary | ICD-10-CM | POA: Diagnosis present

## 2016-08-29 DIAGNOSIS — Z96652 Presence of left artificial knee joint: Secondary | ICD-10-CM | POA: Diagnosis present

## 2016-08-29 DIAGNOSIS — N390 Urinary tract infection, site not specified: Secondary | ICD-10-CM | POA: Diagnosis present

## 2016-08-29 DIAGNOSIS — R319 Hematuria, unspecified: Secondary | ICD-10-CM | POA: Diagnosis present

## 2016-08-29 DIAGNOSIS — I10 Essential (primary) hypertension: Secondary | ICD-10-CM

## 2016-08-29 DIAGNOSIS — Z823 Family history of stroke: Secondary | ICD-10-CM | POA: Diagnosis not present

## 2016-08-29 DIAGNOSIS — Z833 Family history of diabetes mellitus: Secondary | ICD-10-CM | POA: Diagnosis not present

## 2016-08-29 DIAGNOSIS — E785 Hyperlipidemia, unspecified: Secondary | ICD-10-CM | POA: Diagnosis present

## 2016-08-29 DIAGNOSIS — Z7902 Long term (current) use of antithrombotics/antiplatelets: Secondary | ICD-10-CM | POA: Diagnosis not present

## 2016-08-29 DIAGNOSIS — R0600 Dyspnea, unspecified: Secondary | ICD-10-CM | POA: Diagnosis not present

## 2016-08-29 DIAGNOSIS — Z801 Family history of malignant neoplasm of trachea, bronchus and lung: Secondary | ICD-10-CM | POA: Diagnosis not present

## 2016-08-29 DIAGNOSIS — I2510 Atherosclerotic heart disease of native coronary artery without angina pectoris: Secondary | ICD-10-CM | POA: Diagnosis present

## 2016-08-29 DIAGNOSIS — I252 Old myocardial infarction: Secondary | ICD-10-CM | POA: Diagnosis not present

## 2016-08-29 DIAGNOSIS — Z7982 Long term (current) use of aspirin: Secondary | ICD-10-CM | POA: Diagnosis not present

## 2016-08-29 LAB — CBC
HCT: 34 % — ABNORMAL LOW (ref 39.0–52.0)
Hemoglobin: 11.7 g/dL — ABNORMAL LOW (ref 13.0–17.0)
MCH: 29.8 pg (ref 26.0–34.0)
MCHC: 34.4 g/dL (ref 30.0–36.0)
MCV: 86.7 fL (ref 78.0–100.0)
PLATELETS: 169 10*3/uL (ref 150–400)
RBC: 3.92 MIL/uL — ABNORMAL LOW (ref 4.22–5.81)
RDW: 13.3 % (ref 11.5–15.5)
WBC: 12 10*3/uL — AB (ref 4.0–10.5)

## 2016-08-29 LAB — COMPREHENSIVE METABOLIC PANEL
ALK PHOS: 69 U/L (ref 38–126)
ALT: 18 U/L (ref 17–63)
AST: 23 U/L (ref 15–41)
Albumin: 2.6 g/dL — ABNORMAL LOW (ref 3.5–5.0)
Anion gap: 11 (ref 5–15)
BILIRUBIN TOTAL: 0.9 mg/dL (ref 0.3–1.2)
BUN: 27 mg/dL — AB (ref 6–20)
CHLORIDE: 97 mmol/L — AB (ref 101–111)
CO2: 24 mmol/L (ref 22–32)
CREATININE: 1.54 mg/dL — AB (ref 0.61–1.24)
Calcium: 8.9 mg/dL (ref 8.9–10.3)
GFR calc Af Amer: 51 mL/min — ABNORMAL LOW (ref >60)
GFR, EST NON AFRICAN AMERICAN: 44 mL/min — AB (ref >60)
Glucose, Bld: 165 mg/dL — ABNORMAL HIGH (ref 65–99)
Potassium: 3.6 mmol/L (ref 3.5–5.1)
Sodium: 132 mmol/L — ABNORMAL LOW (ref 135–145)
Total Protein: 6.8 g/dL (ref 6.5–8.1)

## 2016-08-29 LAB — TROPONIN I: TROPONIN I: 0.03 ng/mL — AB (ref <0.03)

## 2016-08-29 LAB — GLUCOSE, CAPILLARY
GLUCOSE-CAPILLARY: 139 mg/dL — AB (ref 65–99)
GLUCOSE-CAPILLARY: 158 mg/dL — AB (ref 65–99)
Glucose-Capillary: 148 mg/dL — ABNORMAL HIGH (ref 65–99)
Glucose-Capillary: 142 mg/dL — ABNORMAL HIGH (ref 65–99)

## 2016-08-29 LAB — HEMOGLOBIN A1C
Hgb A1c MFr Bld: 5.9 % — ABNORMAL HIGH (ref 4.8–5.6)
Mean Plasma Glucose: 123 mg/dL

## 2016-08-29 MED ORDER — DORZOLAMIDE HCL 2 % OP SOLN
1.0000 [drp] | Freq: Two times a day (BID) | OPHTHALMIC | Status: DC
  Administered 2016-08-29 – 2016-08-31 (×5): 1 [drp] via OPHTHALMIC
  Filled 2016-08-29: qty 10

## 2016-08-29 MED ORDER — TIMOLOL MALEATE 0.5 % OP SOLN
1.0000 [drp] | Freq: Two times a day (BID) | OPHTHALMIC | Status: DC
  Administered 2016-08-29 – 2016-08-31 (×5): 1 [drp] via OPHTHALMIC
  Filled 2016-08-29: qty 5

## 2016-08-29 NOTE — Care Management Obs Status (Signed)
[place] NOTIFICATION   Patient Details  Name: Austin [name] MRN: [id number] Date of Birth: 1945/09/07   Medicare Observation Status Notification Given:  Yes    Carles Collet, RN 08/29/2016, 1:19 PM

## 2016-08-29 NOTE — Progress Notes (Signed)
PROGRESS NOTE    Austin [name]  [id number] DOB: 10/16/1945 DOA: 08/28/2016 PCP: Binnie Rail, MD   Outpatient Specialists:     Brief Narrative:  Austin [name] is a very pleasant 71 y.o. male with medical history significant for TIA on Plavix, MI 2001, hyperlipidemia, hypertension, CAD, non-Hodgkin's lymphoma in remission tends to the emergency department with chief complaint of 4 day history of generalized weakness intermittent fever/chills, intermittent hematuria worsening cough, nausea without emesis. Initial evaluation reveals community-acquired pneumonia possible urinary tract infection.  Information is obtained from the patient and his wife who is at the bedside. He reports 4 day history generalized body aches nausea without emesis persistent nonproductive cough. Associated symptoms include subjective fever chills chest pain with coughing located substernal nonradiating. Denies palpitations lower extremity edema or orthopnea. Denies dysuria or frequency but does endorse some urgency. He states 2 days ago he developed hematuria with clots. He denies abdominal pain diarrhea constipation melena or bright red blood per rectum.    Assessment & Plan:   Principal Problem:   CAP (community acquired pneumonia) Active Problems:   Anemia   Essential hypertension   Hematuria   Coronary artery disease   AKI (acute kidney injury) ([place])   Hyperglycemia   Pleural effusion   Community-acquired pneumonia. -Follow blood culture -Obtain sputum culture as able -Strep pneumo urine antigen negative -Influenza panel negative -IV antibiotics per protocol -Solu-Medrol -Scheduled nebs -Antitussive -Monitor  Hematuria. 4 day history. Describes some urgency issues.  Patient denies trauma. Home medications does include Plavix. Hemoglobin 12.0. Hx lymphoma U/S shows ? Bladder mass vs prostate lobe -discussed with urology who will arrange follow up in 2-4 weeks -suspect UTI--  await culture-- on rocephin for PNA already  Chest pain. Likely related to coughing. Patient with a history of CAD status post lateral MI. Initial troponin negative. EKG as noted above. -Cycle troponin- trending down -pain resolved  Acute kidney injury. Creatinine 2.03. Likely related to decreased oral intake secondary to above -IV fluids as noted above -BMp daily  Hypertension. Controlled in the emergency department Home medications include amlodipine and cardura. -Hold amlodipine for now -continue cardura -Resume amlodipine when indicated  Hyperglycemia. Serum glucose 150 on admission. Expect it to continue to rise secondary to Solu-Medrol -Carb modified diet -hemoglobin A1c 5.9 -Sliding scale   Pleural effusion per xray. Report Concern for RIGHT middle lobe pneumonia. RIGHT pleural effusion and chronic elevation RIGHT hemidiaphragm Followup PA and lateral chest X-ray is recommended in 3-4 weeks following trial of antibiotic therapy to ensure resolution and exclude underlying malignancy.  -check echo as well  DVT prophylaxis:  SQ Heparin  Code Status: Full Code   Family Communication:   Disposition Plan:     Consultants:   Urology (phone)-- to arrange outpatient follow up      Subjective: Feeling better than at admission  Objective: Vitals:   08/28/16 2050 08/28/16 2155 08/29/16 0218 08/29/16 0537  BP:  132/72 129/72 131/70  Pulse:  92 74 70  Resp:  18 18 18   Temp:  98.9 F (37.2 C) 98.4 F (36.9 C) 97.6 F (36.4 C)  TempSrc:  Oral Oral Oral  SpO2: 98% 94% 92% 98%    Intake/Output Summary (Last 24 hours) at 08/29/16 1011 Last data filed at 08/29/16 0919  Gross per 24 hour  Intake          3586.66 ml  Output              650 ml  Net          2936.66 ml   There were no vitals filed for this visit.  Examination:  General exam: Appears calm and comfortable  Respiratory system: right base diminished Respiratory effort normal. Cardiovascular  system: S1 & S2 heard, RRR. No JVD, murmurs, rubs, gallops or clicks. No pedal edema. Gastrointestinal system: Abdomen is nondistended, soft and nontender. No organomegaly or masses felt. Normal bowel sounds heard. Central nervous system: Alert and oriented. No focal neurological deficits.     Data Reviewed: I have personally reviewed following labs and imaging studies  CBC:  Recent Labs Lab 08/28/16 1328 08/29/16 0542  WBC 16.2* 12.0*  HGB 12.0* 11.7*  HCT 33.8* 34.0*  MCV 86.0 86.7  PLT 163 [id number]   Basic Metabolic Panel:  Recent Labs Lab 08/28/16 1328 08/29/16 0542  NA 131* 132*  K 3.7 3.6  CL 96* 97*  CO2 25 24  GLUCOSE 150* 165*  BUN 29* 27*  CREATININE 2.03* 1.54*  CALCIUM 9.2 8.9   GFR: CrCl cannot be calculated (Unknown ideal weight.). Liver Function Tests:  Recent Labs Lab 08/29/16 0542  AST 23  ALT 18  ALKPHOS 69  BILITOT 0.9  PROT 6.8  ALBUMIN 2.6*   No results for input(s): LIPASE, AMYLASE in the last 168 hours. No results for input(s): AMMONIA in the last 168 hours. Coagulation Profile: No results for input(s): INR, PROTIME in the last 168 hours. Cardiac Enzymes:  Recent Labs Lab 08/28/16 1751 08/28/16 2257 08/29/16 0542  TROPONINI 0.05* 0.08* 0.03*   BNP (last 3 results) No results for input(s): PROBNP in the last 8760 hours. HbA1C:  Recent Labs  08/28/16 1751  HGBA1C 5.9*   CBG:  Recent Labs Lab 08/28/16 1740 08/28/16 2153 08/29/16 0736  GLUCAP 155* 175* 148*   Lipid Profile: No results for input(s): CHOL, HDL, LDLCALC, TRIG, CHOLHDL, LDLDIRECT in the last 72 hours. Thyroid Function Tests: No results for input(s): TSH, T4TOTAL, FREET4, T3FREE, THYROIDAB in the last 72 hours. Anemia Panel: No results for input(s): VITAMINB12, FOLATE, FERRITIN, TIBC, IRON, RETICCTPCT in the last 72 hours. Urine analysis:    Component Value Date/Time   COLORURINE AMBER (A) 08/28/2016 1241   APPEARANCEUR CLOUDY (A) 08/28/2016 1241    LABSPEC 1.017 08/28/2016 1241   PHURINE 5.0 08/28/2016 1241   GLUCOSEU NEGATIVE 08/28/2016 1241   HGBUR LARGE (A) 08/28/2016 1241   HGBUR large 12/15/2009 1149   BILIRUBINUR NEGATIVE 08/28/2016 1241   BILIRUBINUR Neg 05/10/2012 1706   KETONESUR NEGATIVE 08/28/2016 1241   PROTEINUR 100 (A) 08/28/2016 1241   UROBILINOGEN 0.2 05/10/2012 1706   UROBILINOGEN 0.2 12/15/2009 1149   NITRITE POSITIVE (A) 08/28/2016 1241   LEUKOCYTESUR NEGATIVE 08/28/2016 1241     )No results found for this or any previous visit (from the past 240 hour(s)).    Anti-infectives    Start     Dose/Rate Route Frequency Ordered Stop   08/29/16 1400  cefTRIAXone (ROCEPHIN) 1 g in dextrose 5 % 50 mL IVPB     1 g 100 mL/hr over 30 Minutes Intravenous Every 24 hours 08/28/16 1715 09/04/16 1359   08/29/16 1400  azithromycin (ZITHROMAX) tablet 500 mg     500 mg Oral Every 24 hours 08/28/16 1715 09/04/16 1359   08/28/16 1415  azithromycin (ZITHROMAX) 500 mg in dextrose 5 % 250 mL IVPB     500 mg 250 mL/hr over 60 Minutes Intravenous  Once 08/28/16 1404 08/28/16 1535   08/28/16 1415  cefTRIAXone (  ROCEPHIN) 1 g in dextrose 5 % 50 mL IVPB     1 g 100 mL/hr over 30 Minutes Intravenous  Once 08/28/16 1404 08/28/16 1505       Radiology Studies: Dg Chest 2 View  Result Date: 08/28/2016 CLINICAL DATA:  Pt came to ED for eval of general weakness, cough and n/v for the past 3 days. States he started 'passing blood clots in urine' 2 days ago. C/o slight cp but thinks this is due to cough. States he woke one night with fever. Hx of.*comment was truncated* EXAM: CHEST  2 VIEW COMPARISON:  Stable enlarged cardiac silhouette. Chronic elevation RIGHT hemidiaphragm. Increased atelectasis in fluid at the RIGHT lung base. The lateral projection there is consolidation within the RIGHT middle lobe. FINDINGS: The heart size and mediastinal contours are within normal limits. Both lungs are clear. The visualized skeletal structures are  unremarkable. IMPRESSION: Concern for RIGHT middle lobe pneumonia. RIGHT pleural effusion and chronic elevation RIGHT hemidiaphragm Followup PA and lateral chest X-ray is recommended in 3-4 weeks following trial of antibiotic therapy to ensure resolution and exclude underlying malignancy. Electronically Signed   By: Suzy Bouchard M.D.   On: 08/28/2016 13:26   US Renal  Result Date: 08/28/2016 CLINICAL DATA:  Hematuria.  Passing clots for 2 days. EXAM: RENAL / URINARY TRACT ULTRASOUND COMPLETE COMPARISON:  None. FINDINGS: Right Kidney: Length: 11.5 cm. Normal renal cortical thickness and echogenicity without focal lesions or hydronephrosis. Left Kidney: Length: 10.8 cm. Normal renal cortical thickness and echogenicity without focal lesions or hydronephrosis. Bladder: 4.3 x 3.1 x 4.9 cm low bladder lesion with internal blood flow suspicious for bladder neoplasm. It appears to be separate from the prostate gland and I think it is unlikely median lobe hypertrophy. IMPRESSION: 1. 4 cm bladder lesions suspicious for neoplasm. Recommend urology consultation. 2. Normal kidneys. Electronically Signed   By: Marijo Sanes M.D.   On: 08/28/2016 20:47   Dg Chest 2 View  Result Date: 08/29/2016 CLINICAL DATA:  Fever with shortness of breath and pleural effusion EXAM: CHEST  2 VIEW COMPARISON:  August 28, 2016 FINDINGS: There is increased consolidation in the right lower lobe region with right effusion. The left lung is clear. Heart size and pulmonary vascularity are normal. No adenopathy. There is atherosclerotic calcification in the aorta. No bone lesions. IMPRESSION: Increased consolidation right lower lobe region with right pleural effusion. Left lung clear. Stable cardiac silhouette. Aortic atherosclerosis. Electronically Signed   By: Lowella Grip III M.D.   On: 08/29/2016 07:29        Scheduled Meds: . aspirin EC  81 mg Oral Daily  . azithromycin  500 mg Oral Q24H  . cefTRIAXone (ROCEPHIN)  IV  1 g  Intravenous Q24H  . clopidogrel  75 mg Oral Daily  . dorzolamide  1 drop Both Eyes BID  . doxazosin  2 mg Oral Daily  . heparin  5,000 Units Subcutaneous Q8H  . insulin aspart  0-9 Units Subcutaneous TID WC  . methylPREDNISolone (SOLU-MEDROL) injection  60 mg Intravenous Q12H  . sodium chloride flush  3 mL Intravenous Q12H  . timolol  1 drop Both Eyes BID   Continuous Infusions:   LOS: 1 day    Time spent: 35 min    [place], DO [facility] Pager [phone]  If 7PM-7AM, please contact night-coverage [email or web address] Password [email or web address] 08/29/2016, 10:11 AM

## 2016-08-29 NOTE — Care Management Obs Status (Signed)
[place] NOTIFICATION   Patient Details  Name: Austin [name] MRN: [id number] Date of Birth: 09-Dec-1945   Medicare Observation Status Notification Given:  Yes    Carles Collet, RN 08/29/2016, 1:18 PM

## 2016-08-29 NOTE — Care Management CC44 (Signed)
Condition Code 44 Documentation Completed  Patient Details  Name: ANTOAN LAGUNES MRN: [id number] Date of Birth: 01/07/1946   Condition Code 44 given:  Yes Patient signature on Condition Code 44 notice:  Yes Documentation of 2 MD's agreement:  Yes Code 44 added to claim:  Yes    Carles Collet, RN 08/29/2016, 1:19 PM

## 2016-08-29 NOTE — Care Management Note (Addendum)
Case Management Note  Patient Details  Name: Austin [name] MRN: [id number] Date of Birth: 03-12-1946  Subjective/Objective:                 Patient form home with wife in obs for CAP.  No O2 needs at DC. No HH needs.    Action/Plan:  CM will continue to follow for DC needs. 08/31/16 Will DC to home self care.   Expected Discharge Date:                  Expected Discharge Plan:  Home/Self Care  In-House Referral:     Discharge planning Services  CM Consult  Post Acute Care Choice:    Choice offered to:     DME Arranged:    DME Agency:     HH Arranged:    HH Agency:     Status of Service:  In process, will continue to follow  If discussed at Long Length of Stay Meetings, dates discussed:    Additional Comments:  Carles Collet, RN 08/29/2016, 1:20 PM

## 2016-08-30 ENCOUNTER — Inpatient Hospital Stay (HOSPITAL_COMMUNITY): Payer: Medicare HMO

## 2016-08-30 DIAGNOSIS — R0600 Dyspnea, unspecified: Secondary | ICD-10-CM

## 2016-08-30 DIAGNOSIS — R319 Hematuria, unspecified: Secondary | ICD-10-CM

## 2016-08-30 LAB — BASIC METABOLIC PANEL
Anion gap: 8 (ref 5–15)
BUN: 42 mg/dL — AB (ref 6–20)
CHLORIDE: 104 mmol/L (ref 101–111)
CO2: 25 mmol/L (ref 22–32)
Calcium: 9.1 mg/dL (ref 8.9–10.3)
Creatinine, Ser: 1.52 mg/dL — ABNORMAL HIGH (ref 0.61–1.24)
GFR calc Af Amer: 51 mL/min — ABNORMAL LOW (ref >60)
GFR calc non Af Amer: 44 mL/min — ABNORMAL LOW (ref >60)
GLUCOSE: 138 mg/dL — AB (ref 65–99)
POTASSIUM: 4.1 mmol/L (ref 3.5–5.1)
Sodium: 137 mmol/L (ref 135–145)

## 2016-08-30 LAB — CBC
HEMATOCRIT: 30.2 % — AB (ref 39.0–52.0)
HEMOGLOBIN: 10.4 g/dL — AB (ref 13.0–17.0)
MCH: 29.7 pg (ref 26.0–34.0)
MCHC: 34.4 g/dL (ref 30.0–36.0)
MCV: 86.3 fL (ref 78.0–100.0)
Platelets: 208 10*3/uL (ref 150–400)
RBC: 3.5 MIL/uL — AB (ref 4.22–5.81)
RDW: 13.5 % (ref 11.5–15.5)
WBC: 9.2 10*3/uL (ref 4.0–10.5)

## 2016-08-30 LAB — GLUCOSE, CAPILLARY
GLUCOSE-CAPILLARY: 146 mg/dL — AB (ref 65–99)
Glucose-Capillary: 149 mg/dL — ABNORMAL HIGH (ref 65–99)

## 2016-08-30 LAB — URINE CULTURE: Culture: NO GROWTH

## 2016-08-30 LAB — ECHOCARDIOGRAM COMPLETE: Weight: 3128.77 oz

## 2016-08-30 MED ORDER — BENZONATATE 100 MG PO CAPS
200.0000 mg | ORAL_CAPSULE | Freq: Three times a day (TID) | ORAL | Status: DC | PRN
Start: 2016-08-30 — End: 2016-08-31
  Administered 2016-08-30: 200 mg via ORAL
  Filled 2016-08-30 (×2): qty 2

## 2016-08-30 NOTE — Progress Notes (Signed)
  Echocardiogram 2D Echocardiogram has been performed.  Austin [name] 08/30/2016, 11:31 AM

## 2016-08-30 NOTE — Progress Notes (Signed)
PROGRESS NOTE    Austin [name]  [id number] DOB: 11/15/1945 DOA: 08/28/2016 PCP: Binnie Rail, MD   Outpatient Specialists:     Brief Narrative:  Austin [name] is a very pleasant 71 y.o. male with medical history significant for TIA on Plavix, MI 2001, hyperlipidemia, hypertension, CAD, non-Hodgkin's lymphoma in remission tends to the emergency department with chief complaint of 4 day history of generalized weakness intermittent fever/chills, intermittent hematuria worsening cough, nausea without emesis. Initial evaluation reveals community-acquired pneumonia possible urinary tract infection.  Information is obtained from the patient and his wife who is at the bedside. He reports 4 day history generalized body aches nausea without emesis persistent nonproductive cough. Associated symptoms include subjective fever chills chest pain with coughing located substernal nonradiating. Denies palpitations lower extremity edema or orthopnea. Denies dysuria or frequency but does endorse some urgency. He states 2 days ago he developed hematuria with clots. He denies abdominal pain diarrhea constipation melena or bright red blood per rectum.    Assessment & Plan:   Principal Problem:   CAP (community acquired pneumonia) Active Problems:   Anemia   Essential hypertension   Hematuria   Coronary artery disease   AKI (acute kidney injury) ([place])   Hyperglycemia   Pleural effusion   Community-acquired pneumonia. -Follow blood culture -Obtain sputum culture as able -Strep pneumo urine antigen negative -Influenza panel negative -IV antibiotics per protocol -Solu-Medrol- d/c'd -Scheduled nebs -Antitussive -Monitor -legionella pending  Hematuria. 4 day history. Describes some urgency issues.  Patient denies trauma. Home medications does include Plavix. Hemoglobin 12.0. Hx lymphoma U/S shows ? Bladder mass vs prostate lobe -discussed with urology who will arrange follow up in 2-4  weeks -suspect UTI-- but culture NGTD- on rocephin for PNA already  Chest pain. Likely related to coughing. Patient with a history of CAD status post lateral MI. Initial troponin negative. EKG as noted above. -Cycle troponin- trending down -pain resolved  Acute kidney injury.  - Likely related to decreased oral intake secondary to above -IV fluids as noted above -BMp daily  Hypertension. Controlled in the emergency department Home medications include amlodipine and cardura. -Hold amlodipine for now -continue cardura -Resume amlodipine when indicated  Hyperglycemia. -Carb modified diet -hemoglobin A1c 5.9 -Sliding scale   Pleural effusion per xray. Report Concern for RIGHT middle lobe pneumonia. RIGHT pleural effusion and chronic elevation RIGHT hemidiaphragm Followup PA and lateral chest X-ray is recommended in 3-4 weeks following trial of antibiotic therapy to ensure resolution and exclude underlying malignancy.  -echo pending -re-check 2 view in AM   DVT prophylaxis:  SQ Heparin  Code Status: Full Code   Family Communication:   Disposition Plan:     Consultants:   Urology (phone)-- to arrange outpatient follow up      Subjective: Still with cough  Objective: Vitals:   08/29/16 2252 08/30/16 0500 08/30/16 0506 08/30/16 0837  BP: 118/63  126/68 124/70  Pulse: 71  (!) 59   Resp: 17  16   Temp: 98.1 F (36.7 C)  97.8 F (36.6 C)   TempSrc: Oral  Oral   SpO2: 96%  97%   Weight:  88.7 kg (195 lb 8.8 oz)      Intake/Output Summary (Last 24 hours) at 08/30/16 1159 Last data filed at 08/29/16 2256  Gross per 24 hour  Intake              113 ml  Output  0 ml  Net              113 ml   Filed Weights   08/30/16 0500  Weight: 88.7 kg (195 lb 8.8 oz)    Examination:  General exam: Appears calm and comfortable  Respiratory system: right base diminished Respiratory effort normal. Cardiovascular system: S1 & S2 heard, RRR. No JVD,  murmurs, rubs, gallops or clicks. No pedal edema. Gastrointestinal system: Abdomen is nondistended, soft and nontender. No organomegaly or masses felt. Normal bowel sounds heard. Central nervous system: Alert and oriented. No focal neurological deficits.     Data Reviewed: I have personally reviewed following labs and imaging studies  CBC:  Recent Labs Lab 08/28/16 1328 08/29/16 0542 08/30/16 0743  WBC 16.2* 12.0* 9.2  HGB 12.0* 11.7* 10.4*  HCT 33.8* 34.0* 30.2*  MCV 86.0 86.7 86.3  PLT 163 169 [id number]   Basic Metabolic Panel:  Recent Labs Lab 08/28/16 1328 08/29/16 0542 08/30/16 0743  NA 131* 132* 137  K 3.7 3.6 4.1  CL 96* 97* 104  CO2 25 24 25   GLUCOSE 150* 165* 138*  BUN 29* 27* 42*  CREATININE 2.03* 1.54* 1.52*  CALCIUM 9.2 8.9 9.1   GFR: Estimated Creatinine Clearance: 50.4 mL/min (by C-G formula based on SCr of 1.52 mg/dL (H)). Liver Function Tests:  Recent Labs Lab 08/29/16 0542  AST 23  ALT 18  ALKPHOS 69  BILITOT 0.9  PROT 6.8  ALBUMIN 2.6*   No results for input(s): LIPASE, AMYLASE in the last 168 hours. No results for input(s): AMMONIA in the last 168 hours. Coagulation Profile: No results for input(s): INR, PROTIME in the last 168 hours. Cardiac Enzymes:  Recent Labs Lab 08/28/16 1751 08/28/16 2257 08/29/16 0542  TROPONINI 0.05* 0.08* 0.03*   BNP (last 3 results) No results for input(s): PROBNP in the last 8760 hours. HbA1C:  Recent Labs  08/28/16 1751  HGBA1C 5.9*   CBG:  Recent Labs Lab 08/29/16 0736 08/29/16 1229 08/29/16 1717 08/29/16 2252 08/30/16 0804  GLUCAP 148* 142* 139* 158* 146*   Lipid Profile: No results for input(s): CHOL, HDL, LDLCALC, TRIG, CHOLHDL, LDLDIRECT in the last 72 hours. Thyroid Function Tests: No results for input(s): TSH, T4TOTAL, FREET4, T3FREE, THYROIDAB in the last 72 hours. Anemia Panel: No results for input(s): VITAMINB12, FOLATE, FERRITIN, TIBC, IRON, RETICCTPCT in the last 72  hours. Urine analysis:    Component Value Date/Time   COLORURINE AMBER (A) 08/28/2016 1241   APPEARANCEUR CLOUDY (A) 08/28/2016 1241   LABSPEC 1.017 08/28/2016 1241   PHURINE 5.0 08/28/2016 1241   GLUCOSEU NEGATIVE 08/28/2016 1241   HGBUR LARGE (A) 08/28/2016 1241   HGBUR large 12/15/2009 1149   BILIRUBINUR NEGATIVE 08/28/2016 1241   BILIRUBINUR Neg 05/10/2012 1706   KETONESUR NEGATIVE 08/28/2016 1241   PROTEINUR 100 (A) 08/28/2016 1241   UROBILINOGEN 0.2 05/10/2012 1706   UROBILINOGEN 0.2 12/15/2009 1149   NITRITE POSITIVE (A) 08/28/2016 1241   LEUKOCYTESUR NEGATIVE 08/28/2016 1241     ) Recent Results (from the past 240 hour(s))  Blood culture (routine x 2)     Status: None (Preliminary result)   Collection Time: 08/28/16  2:20 PM  Result Value Ref Range Status   Specimen Description BLOOD RIGHT ANTECUBITAL  Final   Special Requests BOTTLES DRAWN AEROBIC AND ANAEROBIC  5CC  Final   Culture NO GROWTH 1 DAY  Final   Report Status PENDING  Incomplete  Blood culture (routine x 2)  Status: None (Preliminary result)   Collection Time: 08/28/16  2:32 PM  Result Value Ref Range Status   Specimen Description BLOOD RIGHT FOREARM  Final   Special Requests BOTTLES DRAWN AEROBIC AND ANAEROBIC 5CC  Final   Culture NO GROWTH < 24 HOURS  Final   Report Status PENDING  Incomplete  Urine culture     Status: None   Collection Time: 08/28/16  7:56 PM  Result Value Ref Range Status   Specimen Description URINE, RANDOM  Final   Special Requests NONE  Final   Culture NO GROWTH  Final   Report Status 08/30/2016 FINAL  Final      Anti-infectives    Start     Dose/Rate Route Frequency Ordered Stop   08/29/16 1400  cefTRIAXone (ROCEPHIN) 1 g in dextrose 5 % 50 mL IVPB     1 g 100 mL/hr over 30 Minutes Intravenous Every 24 hours 08/28/16 1715 09/04/16 1359   08/29/16 1400  azithromycin (ZITHROMAX) tablet 500 mg     500 mg Oral Every 24 hours 08/28/16 1715 09/04/16 1359   08/28/16 1415   azithromycin (ZITHROMAX) 500 mg in dextrose 5 % 250 mL IVPB     500 mg 250 mL/hr over 60 Minutes Intravenous  Once 08/28/16 1404 08/28/16 1535   08/28/16 1415  cefTRIAXone (ROCEPHIN) 1 g in dextrose 5 % 50 mL IVPB     1 g 100 mL/hr over 30 Minutes Intravenous  Once 08/28/16 1404 08/28/16 1505       Radiology Studies: Dg Chest 2 View  Result Date: 08/28/2016 CLINICAL DATA:  Pt came to ED for eval of general weakness, cough and n/v for the past 3 days. States he started 'passing blood clots in urine' 2 days ago. C/o slight cp but thinks this is due to cough. States he woke one night with fever. Hx of.*comment was truncated* EXAM: CHEST  2 VIEW COMPARISON:  Stable enlarged cardiac silhouette. Chronic elevation RIGHT hemidiaphragm. Increased atelectasis in fluid at the RIGHT lung base. The lateral projection there is consolidation within the RIGHT middle lobe. FINDINGS: The heart size and mediastinal contours are within normal limits. Both lungs are clear. The visualized skeletal structures are unremarkable. IMPRESSION: Concern for RIGHT middle lobe pneumonia. RIGHT pleural effusion and chronic elevation RIGHT hemidiaphragm Followup PA and lateral chest X-ray is recommended in 3-4 weeks following trial of antibiotic therapy to ensure resolution and exclude underlying malignancy. Electronically Signed   By: Suzy Bouchard M.D.   On: 08/28/2016 13:26   US Renal  Result Date: 08/28/2016 CLINICAL DATA:  Hematuria.  Passing clots for 2 days. EXAM: RENAL / URINARY TRACT ULTRASOUND COMPLETE COMPARISON:  None. FINDINGS: Right Kidney: Length: 11.5 cm. Normal renal cortical thickness and echogenicity without focal lesions or hydronephrosis. Left Kidney: Length: 10.8 cm. Normal renal cortical thickness and echogenicity without focal lesions or hydronephrosis. Bladder: 4.3 x 3.1 x 4.9 cm low bladder lesion with internal blood flow suspicious for bladder neoplasm. It appears to be separate from the prostate gland  and I think it is unlikely median lobe hypertrophy. IMPRESSION: 1. 4 cm bladder lesions suspicious for neoplasm. Recommend urology consultation. 2. Normal kidneys. Electronically Signed   By: Marijo Sanes M.D.   On: 08/28/2016 20:47   Dg Chest 2 View  Result Date: 08/29/2016 CLINICAL DATA:  Fever with shortness of breath and pleural effusion EXAM: CHEST  2 VIEW COMPARISON:  August 28, 2016 FINDINGS: There is increased consolidation in the right lower lobe  region with right effusion. The left lung is clear. Heart size and pulmonary vascularity are normal. No adenopathy. There is atherosclerotic calcification in the aorta. No bone lesions. IMPRESSION: Increased consolidation right lower lobe region with right pleural effusion. Left lung clear. Stable cardiac silhouette. Aortic atherosclerosis. Electronically Signed   By: Lowella Grip III M.D.   On: 08/29/2016 07:29        Scheduled Meds: . aspirin EC  81 mg Oral Daily  . azithromycin  500 mg Oral Q24H  . cefTRIAXone (ROCEPHIN)  IV  1 g Intravenous Q24H  . clopidogrel  75 mg Oral Daily  . dorzolamide  1 drop Both Eyes BID  . doxazosin  2 mg Oral Daily  . heparin  5,000 Units Subcutaneous Q8H  . insulin aspart  0-9 Units Subcutaneous TID WC  . sodium chloride flush  3 mL Intravenous Q12H  . timolol  1 drop Both Eyes BID   Continuous Infusions:   LOS: 2 days    Time spent: 35 min    [place], DO [facility] Pager [phone]  If 7PM-7AM, please contact night-coverage [email or web address] Password [email or web address] 08/30/2016, 11:59 AM

## 2016-08-31 ENCOUNTER — Inpatient Hospital Stay (HOSPITAL_COMMUNITY): Payer: Medicare HMO

## 2016-08-31 DIAGNOSIS — J90 Pleural effusion, not elsewhere classified: Secondary | ICD-10-CM

## 2016-08-31 LAB — BASIC METABOLIC PANEL
ANION GAP: 7 (ref 5–15)
BUN: 40 mg/dL — ABNORMAL HIGH (ref 6–20)
CO2: 28 mmol/L (ref 22–32)
Calcium: 8.9 mg/dL (ref 8.9–10.3)
Chloride: 102 mmol/L (ref 101–111)
Creatinine, Ser: 1.49 mg/dL — ABNORMAL HIGH (ref 0.61–1.24)
GFR calc non Af Amer: 45 mL/min — ABNORMAL LOW (ref >60)
GFR, EST AFRICAN AMERICAN: 53 mL/min — AB (ref >60)
GLUCOSE: 94 mg/dL (ref 65–99)
POTASSIUM: 3.6 mmol/L (ref 3.5–5.1)
Sodium: 137 mmol/L (ref 135–145)

## 2016-08-31 MED ORDER — DOXYCYCLINE HYCLATE 100 MG PO TABS
100.0000 mg | ORAL_TABLET | Freq: Two times a day (BID) | ORAL | Status: DC
  Administered 2016-08-31: 100 mg via ORAL
  Filled 2016-08-31: qty 1

## 2016-08-31 MED ORDER — DOXYCYCLINE HYCLATE 100 MG PO TABS: 100.0000 mg | ORAL_TABLET | Freq: Two times a day (BID) | ORAL | 0 refills | Status: DC

## 2016-08-31 MED ORDER — BENZONATATE 200 MG PO CAPS: 200.0000 mg | ORAL_CAPSULE | Freq: Three times a day (TID) | ORAL | 0 refills | Status: DC

## 2016-08-31 MED ORDER — GUAIFENESIN 100 MG/5ML PO SOLN: 5.0000 mL | ORAL | 0 refills | Status: DC | PRN

## 2016-08-31 MED ORDER — BENZONATATE 100 MG PO CAPS
200.0000 mg | ORAL_CAPSULE | Freq: Three times a day (TID) | ORAL | Status: DC
Start: 2016-08-31 — End: 2016-08-31
  Administered 2016-08-31: 200 mg via ORAL
  Filled 2016-08-31: qty 2

## 2016-08-31 MED ORDER — HYDROCODONE-HOMATROPINE 5-1.5 MG/5ML PO SYRP: 5.0000 mL | ORAL_SOLUTION | ORAL | 0 refills | Status: DC | PRN

## 2016-08-31 NOTE — Discharge Summary (Addendum)
Physician Discharge Summary  Austin [name] [id number] DOB: 1946-06-12 DOA: 08/28/2016  PCP: Binnie Rail, MD  Admit date: 08/28/2016 Discharge date: 08/31/2016   Recommendations for Outpatient Follow-Up:   1. Outpatient urology follow up- urology to contact patient with appointment 2. Repeat x ray to ensure resolution of PNA- -had elevated hemidiaphragm that may need further investigation if not resolved after PNA 3. BMP 1 week   Discharge Diagnosis:   Principal Problem:   CAP (community acquired pneumonia) Active Problems:   Anemia   Essential hypertension   Hematuria   Coronary artery disease   AKI (acute kidney injury) ([place])   Hyperglycemia   Pleural effusion   Discharge disposition:  Home.  Discharge Condition: Improved.  Diet recommendation: Low sodium, heart healthy.  Carbohydrate-modified.  Wound care: None.   History of Present Illness:   Austin [name] is a very pleasant 71 y.o. male with medical history significant for TIA on Plavix, MI 2001, hyperlipidemia, hypertension, CAD, non-Hodgkin's lymphoma in remission tends to the emergency department with chief complaint of 4 day history of generalized weakness intermittent fever/chills, intermittent hematuria worsening cough, nausea without emesis. Initial evaluation reveals community-acquired pneumonia possible urinary tract infection.  Information is obtained from the patient and his wife who is at the bedside. He reports 4 day history generalized body aches nausea without emesis persistent nonproductive cough. Associated symptoms include subjective fever chills chest pain with coughing located substernal nonradiating. Denies palpitations lower extremity edema or orthopnea. Denies dysuria or frequency but does endorse some urgency. He states 2 days ago he developed hematuria with clots. He denies abdominal pain diarrhea constipation melena or bright red blood per rectum.   Hospital Course by Problem:     Community-acquired pneumonia.: Pleural effusion per xray. Report Concern for RIGHT middle lobe pneumonia. RIGHT pleural effusion and chronic elevation RIGHT hemidiaphragm Followup PA and lateral chest X-ray is recommended in 3-4 weeks following trial of antibiotic therapy to ensure resolution and exclude underlying malignancy. -blood culture NGTD -Strep pneumo urine antigen negative -Influenza panel negative -IV antibiotics changed to PO to complete 7 days -Solu-Medrol- d/c'd -Antitussive PRN Echo done: Normal LV size and systolic function, EF 0000000. Normal RV size   and systolic function. No significant valvular abnormalities.  Hematuria. 4 day history. Describes some urgency issues. Patient denies trauma. Home medications does include Plavix. Hemoglobin 12.0. Hx lymphoma U/S shows ? Bladder mass vs prostate lobe -discussed with urology Louis Meckel) who will arrange follow up in 2-4 weeks -suspect UTI-- but culture NGTD- on rocephin for PNA already  Chest pain. Likely related to coughing. Patient with a history of CAD status post lateral MI. Initial troponin negative. EKG as noted above. -pain resolved  Acute kidney injury.  - Likely related to decreased oral intake secondary to above -IV fluids as noted above  Hypertension. Controlled in the emergency department Home medications include amlodipine and cardura. -BP controlled without home med currently -recheck in office 1 week  Hyperglycemia. -Carb modified diet -hemoglobin A1c 5.9      Medical Consultants:    None.   Discharge Exam:   Vitals:   08/31/16 1034 08/31/16 1336  BP: 131/65 124/68  Pulse:  66  Resp:  15  Temp:  98 F (36.7 C)   Vitals:   08/31/16 0626 08/31/16 1001 08/31/16 1034 08/31/16 1336  BP: 120/67  131/65 124/68  Pulse: (!) 54   66  Resp: 17   15  Temp: 98.1 F (36.7 C)  98 F (36.7 C)  TempSrc: Oral   Oral  SpO2: 97% 93%  99%  Weight: 89.7 kg (197 lb 12 oz)       Gen:   NAD   The results of significant diagnostics from this hospitalization (including imaging, microbiology, ancillary and laboratory) are listed below for reference.     Procedures and Diagnostic Studies:   Dg Chest 2 View  Result Date: 08/28/2016 CLINICAL DATA:  Pt came to ED for eval of general weakness, cough and n/v for the past 3 days. States he started 'passing blood clots in urine' 2 days ago. C/o slight cp but thinks this is due to cough. States he woke one night with fever. Hx of.*comment was truncated* EXAM: CHEST  2 VIEW COMPARISON:  Stable enlarged cardiac silhouette. Chronic elevation RIGHT hemidiaphragm. Increased atelectasis in fluid at the RIGHT lung base. The lateral projection there is consolidation within the RIGHT middle lobe. FINDINGS: The heart size and mediastinal contours are within normal limits. Both lungs are clear. The visualized skeletal structures are unremarkable. IMPRESSION: Concern for RIGHT middle lobe pneumonia. RIGHT pleural effusion and chronic elevation RIGHT hemidiaphragm Followup PA and lateral chest X-ray is recommended in 3-4 weeks following trial of antibiotic therapy to ensure resolution and exclude underlying malignancy. Electronically Signed   By: Suzy Bouchard M.D.   On: 08/28/2016 13:26   US Renal  Result Date: 08/28/2016 CLINICAL DATA:  Hematuria.  Passing clots for 2 days. EXAM: RENAL / URINARY TRACT ULTRASOUND COMPLETE COMPARISON:  None. FINDINGS: Right Kidney: Length: 11.5 cm. Normal renal cortical thickness and echogenicity without focal lesions or hydronephrosis. Left Kidney: Length: 10.8 cm. Normal renal cortical thickness and echogenicity without focal lesions or hydronephrosis. Bladder: 4.3 x 3.1 x 4.9 cm low bladder lesion with internal blood flow suspicious for bladder neoplasm. It appears to be separate from the prostate gland and I think it is unlikely median lobe hypertrophy. IMPRESSION: 1. 4 cm bladder lesions suspicious for neoplasm.  Recommend urology consultation. 2. Normal kidneys. Electronically Signed   By: Marijo Sanes M.D.   On: 08/28/2016 20:47   Dg Chest 2 View  Result Date: 08/29/2016 CLINICAL DATA:  Fever with shortness of breath and pleural effusion EXAM: CHEST  2 VIEW COMPARISON:  August 28, 2016 FINDINGS: There is increased consolidation in the right lower lobe region with right effusion. The left lung is clear. Heart size and pulmonary vascularity are normal. No adenopathy. There is atherosclerotic calcification in the aorta. No bone lesions. IMPRESSION: Increased consolidation right lower lobe region with right pleural effusion. Left lung clear. Stable cardiac silhouette. Aortic atherosclerosis. Electronically Signed   By: Lowella Grip III M.D.   On: 08/29/2016 07:29     Labs:   Basic Metabolic Panel:  Recent Labs Lab 08/28/16 1328 08/29/16 0542 08/30/16 0743 08/31/16 0513  NA 131* 132* 137 137  K 3.7 3.6 4.1 3.6  CL 96* 97* 104 102  CO2 25 24 25 28   GLUCOSE 150* 165* 138* 94  BUN 29* 27* 42* 40*  CREATININE 2.03* 1.54* 1.52* 1.49*  CALCIUM 9.2 8.9 9.1 8.9   GFR Estimated Creatinine Clearance: 51.4 mL/min (by C-G formula based on SCr of 1.49 mg/dL (H)). Liver Function Tests:  Recent Labs Lab 08/29/16 0542  AST 23  ALT 18  ALKPHOS 69  BILITOT 0.9  PROT 6.8  ALBUMIN 2.6*   No results for input(s): LIPASE, AMYLASE in the last 168 hours. No results for input(s): AMMONIA in the last 168  hours. Coagulation profile No results for input(s): INR, PROTIME in the last 168 hours.  CBC:  Recent Labs Lab 08/28/16 1328 08/29/16 0542 08/30/16 0743  WBC 16.2* 12.0* 9.2  HGB 12.0* 11.7* 10.4*  HCT 33.8* 34.0* 30.2*  MCV 86.0 86.7 86.3  PLT 163 169 208   Cardiac Enzymes:  Recent Labs Lab 08/28/16 1751 08/28/16 2257 08/29/16 0542  TROPONINI 0.05* 0.08* 0.03*   BNP: Invalid input(s): POCBNP CBG:  Recent Labs Lab 08/29/16 1229 08/29/16 1717 08/29/16 2252 08/30/16 0804  08/30/16 1156  GLUCAP 142* 139* 158* 146* 149*   D-Dimer No results for input(s): DDIMER in the last 72 hours. Hgb A1c  Recent Labs  08/28/16 1751  HGBA1C 5.9*   Lipid Profile No results for input(s): CHOL, HDL, LDLCALC, TRIG, CHOLHDL, LDLDIRECT in the last 72 hours. Thyroid function studies No results for input(s): TSH, T4TOTAL, T3FREE, THYROIDAB in the last 72 hours.  Invalid input(s): FREET3 Anemia work up No results for input(s): VITAMINB12, FOLATE, FERRITIN, TIBC, IRON, RETICCTPCT in the last 72 hours. Microbiology Recent Results (from the past 240 hour(s))  Blood culture (routine x 2)     Status: None (Preliminary result)   Collection Time: 08/28/16  2:20 PM  Result Value Ref Range Status   Specimen Description BLOOD RIGHT ANTECUBITAL  Final   Special Requests BOTTLES DRAWN AEROBIC AND ANAEROBIC  5CC  Final   Culture NO GROWTH 2 DAYS  Final   Report Status PENDING  Incomplete  Blood culture (routine x 2)     Status: None (Preliminary result)   Collection Time: 08/28/16  2:32 PM  Result Value Ref Range Status   Specimen Description BLOOD RIGHT FOREARM  Final   Special Requests BOTTLES DRAWN AEROBIC AND ANAEROBIC 5CC  Final   Culture NO GROWTH 2 DAYS  Final   Report Status PENDING  Incomplete  Urine culture     Status: None   Collection Time: 08/28/16  7:56 PM  Result Value Ref Range Status   Specimen Description URINE, RANDOM  Final   Special Requests NONE  Final   Culture NO GROWTH  Final   Report Status 08/30/2016 FINAL  Final     Discharge Instructions:   Discharge Instructions    Diet - low sodium heart healthy    Complete by:  As directed    Discharge instructions    Complete by:  As directed    Outpatient urology appointment for ? Prostate vs bladder mass BMP 1 week Repeat 2 view chest x ray to ensure resolution of PNA as wellas   Increase activity slowly    Complete by:  As directed      Allergies as of 08/31/2016      Reactions   Zetia  [ezetimibe] Shortness Of Breath, Other (See Comments)   Chest pain   Crestor [rosuvastatin] Other (See Comments)   Myalgias   Pravachol [pravastatin Sodium] Other (See Comments)   Myalgias   Isosorbide Mononitrate [isosorbide Nitrate] Other (See Comments)   Headache, throat soreness and hoarse   Silver Rash      Medication List    STOP taking these medications   amLODipine 10 MG tablet Commonly known as:  NORVASC     TAKE these medications   aspirin 81 MG tablet Take 81 mg by mouth daily.   benzonatate 200 MG capsule Commonly known as:  TESSALON Take 1 capsule (200 mg total) by mouth 3 (three) times daily.   clopidogrel 75 MG tablet Commonly known as:  PLAVIX TAKE 1 TABLET(75 MG) BY MOUTH DAILY   dorzolamide-timolol 22.3-6.8 MG/ML ophthalmic solution Commonly known as:  COSOPT Place 1 drop into both eyes 2 (two) times daily.   doxazosin 2 MG tablet Commonly known as:  CARDURA Take 1 tablet (2 mg total) by mouth daily.   doxycycline 100 MG tablet Commonly known as:  VIBRA-TABS Take 1 tablet (100 mg total) by mouth every 12 (twelve) hours.   guaiFENesin 100 MG/5ML Soln Commonly known as:  ROBITUSSIN Take 5 mLs (100 mg total) by mouth every 4 (four) hours as needed for cough or to loosen phlegm.   HYDROcodone-homatropine 5-1.5 MG/5ML syrup Commonly known as:  HYCODAN Take 5 mLs by mouth every 4 (four) hours as needed for cough.   nitroGLYCERIN 0.4 MG SL tablet Commonly known as:  NITROSTAT PLACE AND DISSOLVE 1 TABLET UNDER THE TONGUE EVERY 5 MINUTES AS NEEDED FOR CHEST PAIN   timolol 0.25 % ophthalmic solution Commonly known as:  BETIMOL Place 1 drop into both eyes 2 (two) times daily.      Follow-up Information    Binnie Rail, MD Follow up in 1 week(s).   Specialty:  Internal Medicine Why:  BMP 1 week re Cr and follow up chest x ray to ensure resolution of PNA Contact information: [address] [place] [phone]        urology  will be calling to set up follow up appointment Follow up.            Time coordinating discharge: 35 min  Signed:  Krishiv Sandler U Livan Hires   [facility] 08/31/2016, 2:11 PM

## 2016-08-31 NOTE — Progress Notes (Signed)
Patient ambulated in hallway on room approximately 159ft and oxygen saturation remained between 94-98%. Patient did not complaint of any shortness of breath. Will continue to monitor. Austin [name]

## 2016-08-31 NOTE — Progress Notes (Signed)
Patient's RN Rodman Pickle stated okay for patient to discharge. Paperwork and prescriptions given and reviewed with patient. All questions answered.

## 2016-09-01 LAB — LEGIONELLA PNEUMOPHILA SEROGP 1 UR AG: L. pneumophila Serogp 1 Ur Ag: NEGATIVE

## 2016-09-02 LAB — CULTURE, BLOOD (ROUTINE X 2)
CULTURE: NO GROWTH
Culture: NO GROWTH

## 2016-09-04 DIAGNOSIS — R310 Gross hematuria: Secondary | ICD-10-CM | POA: Diagnosis not present

## 2016-09-04 DIAGNOSIS — N138 Other obstructive and reflux uropathy: Secondary | ICD-10-CM | POA: Diagnosis not present

## 2016-09-05 ENCOUNTER — Emergency Department (HOSPITAL_COMMUNITY)
Admission: EM | Admit: 2016-09-05 | Discharge: 2016-09-06 | Disposition: A | Discharge status: Home / Self Care | Payer: Medicare HMO | Attending: Emergency Medicine | Admitting: Emergency Medicine

## 2016-09-05 ENCOUNTER — Telehealth: Payer: Self-pay | Admitting: Internal Medicine

## 2016-09-05 ENCOUNTER — Emergency Department (HOSPITAL_COMMUNITY): Payer: Medicare HMO

## 2016-09-05 ENCOUNTER — Encounter (HOSPITAL_COMMUNITY): Payer: Self-pay

## 2016-09-05 DIAGNOSIS — I10 Essential (primary) hypertension: Secondary | ICD-10-CM | POA: Diagnosis not present

## 2016-09-05 DIAGNOSIS — R079 Chest pain, unspecified: Secondary | ICD-10-CM | POA: Diagnosis not present

## 2016-09-05 DIAGNOSIS — R066 Hiccough: Secondary | ICD-10-CM | POA: Diagnosis not present

## 2016-09-05 DIAGNOSIS — Z8673 Personal history of transient ischemic attack (TIA), and cerebral infarction without residual deficits: Secondary | ICD-10-CM | POA: Insufficient documentation

## 2016-09-05 DIAGNOSIS — Z7982 Long term (current) use of aspirin: Secondary | ICD-10-CM | POA: Diagnosis not present

## 2016-09-05 DIAGNOSIS — I252 Old myocardial infarction: Secondary | ICD-10-CM | POA: Insufficient documentation

## 2016-09-05 DIAGNOSIS — I2510 Atherosclerotic heart disease of native coronary artery without angina pectoris: Secondary | ICD-10-CM | POA: Diagnosis not present

## 2016-09-05 DIAGNOSIS — Z79899 Other long term (current) drug therapy: Secondary | ICD-10-CM | POA: Diagnosis not present

## 2016-09-05 LAB — TROPONIN I: Troponin I: <0.03 ng/mL (ref <0.03)

## 2016-09-05 LAB — BASIC METABOLIC PANEL
Anion gap: 6 (ref 5–15)
BUN: 13 mg/dL (ref 6–20)
CO2: 28 mmol/L (ref 22–32)
Calcium: 9.5 mg/dL (ref 8.9–10.3)
Chloride: 101 mmol/L (ref 101–111)
Creatinine, Ser: 1.08 mg/dL (ref 0.61–1.24)
GFR calc Af Amer: >60 mL/min (ref >60)
GLUCOSE: 116 mg/dL — AB (ref 65–99)
POTASSIUM: 4.6 mmol/L (ref 3.5–5.1)
Sodium: 135 mmol/L (ref 135–145)

## 2016-09-05 LAB — CBC
HEMATOCRIT: 32.9 % — AB (ref 39.0–52.0)
Hemoglobin: 11.2 g/dL — ABNORMAL LOW (ref 13.0–17.0)
MCH: 30 pg (ref 26.0–34.0)
MCHC: 34 g/dL (ref 30.0–36.0)
MCV: 88.2 fL (ref 78.0–100.0)
Platelets: 425 10*3/uL — ABNORMAL HIGH (ref 150–400)
RBC: 3.73 MIL/uL — ABNORMAL LOW (ref 4.22–5.81)
RDW: 12.9 % (ref 11.5–15.5)
WBC: 7.4 10*3/uL (ref 4.0–10.5)

## 2016-09-05 NOTE — ED Triage Notes (Signed)
Pt presents with hiccups and chest pain x 5 days. Pt endorses some shob with hiccups. Pt observed by this RN to have hiccups repeatedly and became shob with this RN in triage.

## 2016-09-05 NOTE — Telephone Encounter (Signed)
What is he taking for the cough?  Can we move up his appt?

## 2016-09-05 NOTE — Telephone Encounter (Signed)
Please advise if anything can be done for this.

## 2016-09-05 NOTE — Telephone Encounter (Signed)
Pt called in and said that he is coughing and hiccupping all day since he got out of the [place] number [phone]

## 2016-09-05 NOTE — Telephone Encounter (Signed)
yes

## 2016-09-05 NOTE — Telephone Encounter (Signed)
Spoke with pts wife, appt has been change.

## 2016-09-05 NOTE — Telephone Encounter (Signed)
Do you want me to use the 2 same day slots of Friday? There are not any other appts open.

## 2016-09-06 MED ORDER — DIPHENHYDRAMINE HCL 50 MG/ML IJ SOLN
12.5000 mg | Freq: Once | INTRAMUSCULAR | Status: AC
  Administered 2016-09-06: 12.5 mg via INTRAVENOUS
  Filled 2016-09-06: qty 1

## 2016-09-06 MED ORDER — METOCLOPRAMIDE HCL 5 MG/ML IJ SOLN
10.0000 mg | Freq: Once | INTRAMUSCULAR | Status: AC
Start: 2016-09-06 — End: 2016-09-06
  Administered 2016-09-06: 10 mg via INTRAVENOUS
  Filled 2016-09-06: qty 2

## 2016-09-06 MED ORDER — METOCLOPRAMIDE HCL 10 MG PO TABS: 10.0000 mg | ORAL_TABLET | Freq: Three times a day (TID) | ORAL | 0 refills | Status: DC | PRN

## 2016-09-06 MED ORDER — METOCLOPRAMIDE HCL 5 MG/ML IJ SOLN
10.0000 mg | Freq: Once | INTRAMUSCULAR | Status: AC
  Administered 2016-09-06: 10 mg via INTRAVENOUS
  Filled 2016-09-06: qty 2

## 2016-09-06 NOTE — ED Notes (Signed)
Pt given ginger ale and water 

## 2016-09-06 NOTE — ED Provider Notes (Signed)
[facility] DEPT Provider Note   CSN: [id number] Arrival date & time: 09/05/16  2006   By signing my name below, I, Delton Prairie, attest that this documentation has been prepared under the direction and in the presence of Ripley Fraise, MD  Electronically Signed: Delton Prairie, ED Scribe. 09/06/16. 12:45 AM.   History   Chief Complaint Chief Complaint  Patient presents with  . Hiccups  . Chest Pain   The history is provided by the patient and the spouse. No language interpreter was used.  Chest Pain   This is a new problem. The current episode started yesterday. Associated with: hiccups. The pain is mild. The pain does not radiate. Associated symptoms include cough and shortness of breath. Pertinent negatives include no abdominal pain, no fever, no nausea and no vomiting. He has tried nothing for the symptoms.  His past medical history is significant for CAD, hypertension, MI and TIA.  Procedure history is positive for cardiac catheterization.   HPI Comments:  Austin [name] is a 71 y.o. male, with a hx of HTN, MI, and CAD, who presents to the Emergency Department complaining of sudden onset, persistent cough and hiccups x 5 days. Wife also reports a sore throat secondary to his hiccups x yesterday, chest pain and SOB. No alleviating factors noted. Pt denies syncope, abdominal pain, nausea, vomiting and a hx of similar symptoms. Pt has not taken any medication for his hiccups.   Past Medical History:  Diagnosis Date  . Allergy   . CAP (community acquired pneumonia)   . Coronary artery disease    03/13/2016 DES to first OM, EF 55%  . Heart murmur    "outgrew it" (03/13/2016)  . Hematuria   . HTN (hypertension)   . Hx of cardiovascular stress test    ETT-Myoview (10/15): Nondiagnostic EKG changes, anterolateral and inferolateral scar, no ischemia, EF 48%, low risk  . Hyperlipemia   . Hyperlipidemia    NMR Lipoprofile 2008: LDL 168 ( 2410/ 1826), HDL 41, TG 71. LDL goal =  <100, ideally <  70. Framingham Study LDL goal = < 130.  Marland Kitchen Kidney stones    "passed them"; Dr. Terance Hart (03/13/2016)  . Myocardial infarction 2001  . Non Hod[facility]  . Pleural effusion   . TIA (transient ischemic attack) 1980s   "I was exercising when I had it"    Patient Active Problem List   Diagnosis Date Noted  . CAP (community acquired pneumonia) 08/28/2016  . AKI (acute kidney injury) ([place]) 08/28/2016  . Hyperglycemia 08/28/2016  . Pneumonia 08/28/2016  . Pleural effusion 08/28/2016  . Ischemic chest pain ([place]) 03/13/2016  . Exertional angina ([place]) 12/22/2015  . Coronary artery disease 12/13/2010  . Hyperlipidemia 11/12/2010  . ALLERGIC RHINITIS 11/12/2010  . Anemia 12/28/2009  . THROMBOCYTOPENIA 12/28/2009  . 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 Hematuria 06/22/2009  . Essential hypertension 11/26/2008  . NEPHROLITHIASIS, HX OF 06/26/2007  . 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 CARDIAC CATHETERIZATION N/A 12/22/2015   Procedure: Left Heart Cath and Coronary Angiography;  Surgeon: Sherren Mocha, MD;  Location: [facility];  Service: Cardiovascular;  Laterality: N/A;  . CARDIAC CATHETERIZATION N/A 12/22/2015   Procedure: Coronary Stent Intervention;  Surgeon: Sherren Mocha, MD;  Location: [facility];  Service: Cardiovascular;  Laterality: N/A;  . CARDIAC CATHETERIZATION N/A 12/22/2015   Procedure:  Intravascular Pressure Wire/FFR Study;  Surgeon: Sherren Mocha, MD;  Location: [facility];  Service: Cardiovascular;  Laterality: N/A;  . CARDIAC CATHETERIZATION N/A 03/13/2016   Procedure: Left Heart Cath and Coronary Angiography;  Surgeon: Sherren Mocha, MD;  Location: [facility];  Service: Cardiovascular;  Laterality: N/A;  . COLONOSCOPY  2002  . CORONARY ANGIOPLASTY WITH STENT PLACEMENT  ~ 2013   "1 stent"  .  ESOPHAGOGASTRODUODENOSCOPY (EGD) WITH ESOPHAGEAL DILATION  2002  . KNEE ARTHROSCOPY Left 1990s    Home Medications    Prior to Admission medications   Medication Sig Start Date End Date Taking? Authorizing Provider  aspirin 81 MG tablet Take 81 mg by mouth daily.    Historical Provider, MD  benzonatate (TESSALON) 200 MG capsule Take 1 capsule (200 mg total) by mouth 3 (three) times daily. 08/31/16   Geradine Girt, DO  clopidogrel (PLAVIX) 75 MG tablet TAKE 1 TABLET(75 MG) BY MOUTH DAILY 03/24/16   Bhavinkumar Bhagat, PA  dorzolamide-timolol (COSOPT) 22.3-6.8 MG/ML ophthalmic solution Place 1 drop into both eyes 2 (two) times daily. 12/15/15   Historical Provider, MD  doxazosin (CARDURA) 2 MG tablet Take 1 tablet (2 mg total) by mouth daily. 07/02/15   Sherren Mocha, MD  doxycycline (VIBRA-TABS) 100 MG tablet Take 1 tablet (100 mg total) by mouth every 12 (twelve) hours. 08/31/16   Geradine Girt, DO  guaiFENesin (ROBITUSSIN) 100 MG/5ML SOLN Take 5 mLs (100 mg total) by mouth every 4 (four) hours as needed for cough or to loosen phlegm. 08/31/16   Geradine Girt, DO  HYDROcodone-homatropine (HYCODAN) 5-1.5 MG/5ML syrup Take 5 mLs by mouth every 4 (four) hours as needed for cough. 08/31/16   Geradine Girt, DO  nitroGLYCERIN (NITROSTAT) 0.4 MG SL tablet PLACE AND DISSOLVE 1 TABLET UNDER THE TONGUE EVERY 5 MINUTES AS NEEDED FOR CHEST PAIN 03/24/16   Sherren Mocha, MD  timolol (BETIMOL) 0.25 % ophthalmic solution Place 1 drop into both eyes 2 (two) times daily.     Historical Provider, MD    Family History Family History  Problem Relation Age of Onset  . Cancer Mother     lung cancer  . Diabetes Mother   . Hyperlipidemia Mother   . Stroke Father     age 68  . Cancer Sister     ?lymphoma    Social History Social History  Substance Use Topics  . Smoking status: Never Smoker  . Smokeless tobacco: Never Used  . Alcohol use Yes     Comment: drank some in my 20's"     Allergies   Zetia  [ezetimibe]; Crestor [rosuvastatin]; Pravachol [pravastatin sodium]; Isosorbide mononitrate [isosorbide nitrate]; and Silver   Review of Systems Review of Systems  Constitutional: Negative for fever.  HENT: Positive for sore throat.   Respiratory: Positive for cough and shortness of breath.   Cardiovascular: Positive for chest pain.  Gastrointestinal: Negative for abdominal pain, nausea and vomiting.  Neurological: Negative for syncope.  All other systems reviewed and are negative.   Physical Exam Updated Vital Signs BP 145/82   Pulse 70   Temp 98.6 F (37 C) (Oral)   Resp 18   Ht 6\' 1"  (1.854 m)   Wt 197 lb (89.4 kg)   SpO2 98%   BMI 25.99 kg/m   Physical Exam CONSTITUTIONAL: Well developed/well nourished. Pt hiccups frequently during exam HEAD: Normocephalic/atraumatic EYES: EOMI/PERRL ENMT: Mucous membranes moist. Uvula midline no edema noted, no stridor noted  NECK: supple no meningeal signs SPINE/BACK:entire spine nontender CV: S1/S2 noted, no murmurs/rubs/gallops noted LUNGS: Lungs are clear to auscultation bilaterally, no apparent distress ABDOMEN: soft, nontender, no rebound or guarding, bowel sounds noted throughout abdomen GU:no cva tenderness NEURO: Pt is awake/alert/appropriate, moves all extremitiesx4.  No facial droop.   EXTREMITIES: pulses normal/equal, full ROM SKIN: warm, color normal PSYCH: no abnormalities of mood noted, alert and oriented to situation  ED Treatments / Results  DIAGNOSTIC STUDIES:  Oxygen Saturation is 98% on RA, normal by my interpretation.    COORDINATION OF CARE:  12:43 AM Discussed treatment plan with pt at bedside and pt agreed to plan.  Labs (all labs ordered are listed, but only abnormal results are displayed) Labs Reviewed  BASIC METABOLIC PANEL - Abnormal; Notable for the following:       Result Value   Glucose, Bld 116 (*)    All other components within normal limits  CBC - Abnormal; Notable for the following:      RBC 3.73 (*)    Hemoglobin 11.2 (*)    HCT 32.9 (*)    Platelets 425 (*)    All other components within normal limits  TROPONIN I    EKG  EKG Interpretation  Date/Time:  Tuesday September 05 2016 20:42:12 EST Ventricular Rate:  73 PR Interval:  174 QRS Duration: 80 QT Interval:  368 QTC Calculation: 405 R Axis:   33 Text Interpretation:  Normal sinus rhythm Cannot rule out Inferior infarct , age undetermined Abnormal ECG No significant change since last tracing Confirmed by Christy Gentles  MD, [place] ([id number]) on 09/06/2016 12:24:47 AM       Radiology Dg Chest 2 View  Result Date: 09/05/2016 CLINICAL DATA:  Constant hiccups for 5 days mid chest pain EXAM: CHEST  2 VIEW COMPARISON:  08/31/2016, 10/31/2013 FINDINGS: The left lung is grossly clear. Elevation of the right diaphragm. Small right effusion and right basilar atelectasis or infiltrate not significantly changed. Stable cardiomediastinal silhouette. No pneumothorax. IMPRESSION: Stable elevation of the right diaphragm with small pleural effusion and right basilar atelectasis or infiltrate. Electronically Signed   By: Donavan Foil M.D.   On: 09/05/2016 21:18    Procedures Procedures (including critical care time)  Medications Ordered in ED Medications  metoCLOPramide (REGLAN) injection 10 mg (10 mg Intravenous Given 09/06/16 0109)  diphenhydrAMINE (BENADRYL) injection 12.5 mg (12.5 mg Intravenous Given 09/06/16 0322)  metoCLOPramide (REGLAN) injection 10 mg (10 mg Intravenous Given 09/06/16 0322)     Initial Impression / Assessment and Plan / ED Course  I have reviewed the triage vital signs and the nursing notes.  Pertinent labs & imaging results that were available during my care of the patient were reviewed by me and considered in my medical decision making (see chart for details).  Clinical Course     Pt in the ED for hiccups He is awake/alert He is improved with reglan He is taking PO No respiratory distress  noted Will d/c home He reports right sided chest wall pain, tender to palpation, suspect this is due to persistent hiccups I doubt ACS or other acute process Advised PCP f/u in one week   Final Clinical Impressions(s) / ED Diagnoses   Final diagnoses:  Singultus    New Prescriptions New Prescriptions   METOCLOPRAMIDE (REGLAN) 10 MG TABLET    Take 1 tablet (10 mg total) by mouth every 8 (eight) hours as needed for nausea.  I personally performed the services described in this documentation, which  was scribed in my presence. The recorded information has been reviewed and is accurate.        Ripley Fraise, MD 09/06/16 [phone]

## 2016-09-07 NOTE — Progress Notes (Signed)
Subjective:    Patient ID: Austin [name], male    DOB: 06-Aug-1946, 71 y.o.   MRN: [id number]  HPI He is here for follow up from the hospital.     In hospital 08/28/16 - 08/31/16 for CAP.  He went to ED for 4 days of generalized weakness, intermittent fever/chills, intermittent hematuria, cough, chest pain with coughing, nausea.  He was diagnosed with CAP and UTI.   CAP:  Pleural effusion on xray.  Likely RML PNA.  Chronic elevation of right hemidiaphragm. Blood cultures negative.  Strep penumo antigen negative.  Flu negative. Initially on IV abx then changed to PO.  Initially had solu-medrol.    Chest pain:  Echo normal.  Likely related to coughing.  Initial troponin negative.  Pain resolved in hospital. Will follow up with cardiology as an outpatient.   Hematuria with urgency.  US showed ? Bladder mass vs prostate lobe. Will see urology in 2-4 weeks as outpatient.  Likely UTI, but culture was negative.   AKI resolved with IVF.   Hypertension:  Stable on hold meds.  Hyperglycemia:  A1c 5.9%.  CXR 08/31/16:  IMPRESSION: Chronic volume loss on the right due to elevation of the hemidiaphragm. A small amount of basilar atelectasis on the right is suspected with small right pleural effusion.  Thoracic aortic atherosclerosis.   He developed hiccups and went back to ED 09/05/16.  He had a persistent cough.  He received reglan and benadryl and his hiccups improved.  He was discharged home with reglan.   CXR 09/05/16  IMPRESSION: Stable elevation of the right diaphragm with small pleural effusion and right basilar atelectasis or infiltrate.   He continues to have hiccups.  He has a dry cough and occasional wheezing.  He has decreased appetite.  The reglan has helped with his hiccups.  He has taken benadryl at night to help him sleep - if he does not he will not be able to sleep because of the hiccups.    He just wants the hiccups to completely go away.    Medications and allergies  reviewed with patient and updated if appropriate.  Patient Active Problem List   Diagnosis Date Noted  . CAP (community acquired pneumonia) 08/28/2016  . AKI (acute kidney injury) ([place]) 08/28/2016  . Hyperglycemia 08/28/2016  . Pneumonia 08/28/2016  . Pleural effusion 08/28/2016  . Ischemic chest pain ([place]) 03/13/2016  . Exertional angina ([place]) 12/22/2015  . Coronary artery disease 12/13/2010  . Hyperlipidemia 11/12/2010  . ALLERGIC RHINITIS 11/12/2010  . Anemia 12/28/2009  . THROMBOCYTOPENIA 12/28/2009  . 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 Hematuria 06/22/2009  . Essential hypertension 11/26/2008  . NEPHROLITHIASIS, HX OF 06/26/2007  . 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 aspirin 81 MG tablet Take 81 mg by mouth daily.    . clopidogrel (PLAVIX) 75 MG tablet TAKE 1 TABLET(75 MG) BY MOUTH DAILY 90 tablet 3  . dorzolamide-timolol (COSOPT) 22.3-6.8 MG/ML ophthalmic solution Place 1 drop into both eyes 2 (two) times daily.    Marland Kitchen doxazosin (CARDURA) 2 MG tablet Take 1 tablet (2 mg total) by mouth daily. 90 tablet 3  . metoCLOPramide (REGLAN) 10 MG tablet Take 1 tablet (10 mg total) by mouth every 8 (eight) hours as needed for nausea. 21 tablet 0  . nitroGLYCERIN (NITROSTAT) 0.4 MG SL tablet PLACE AND DISSOLVE 1 TABLET UNDER  THE TONGUE EVERY 5 MINUTES AS NEEDED FOR CHEST PAIN 75 tablet 3  . timolol (BETIMOL) 0.25 % ophthalmic solution Place 1 drop into both eyes 2 (two) times daily.      No current facility-administered medications on file prior to visit.     Past Medical History:  Diagnosis Date  . Allergy   . CAP (community acquired pneumonia)   . Coronary artery disease    03/13/2016 DES to first OM, EF 55%  . Heart murmur    "outgrew it" (03/13/2016)  . Hematuria   . HTN (hypertension)   . Hx of cardiovascular stress test    ETT-Myoview (10/15):  Nondiagnostic EKG changes, anterolateral and inferolateral scar, no ischemia, EF 48%, low risk  . Hyperlipemia   . Hyperlipidemia    NMR Lipoprofile 2008: LDL 168 ( 2410/ 1826), HDL 41, TG 71. LDL goal = <100, ideally <  70. Framingham Study LDL goal = < 130.  Marland Kitchen Kidney stones    "passed them"; Dr. Terance Hart (03/13/2016)  . Myocardial infarction 2001  . Non Hodgkin's lymphoma ([place])    [facility]  . Pleural effusion   . TIA (transient ischemic attack) 1980s   "I was exercising when I had it"    Past Surgical History:  Procedure Laterality Date  . CARDIAC CATHETERIZATION N/A 12/22/2015   Procedure: Left Heart Cath and Coronary Angiography;  Surgeon: Sherren Mocha, MD;  Location: [facility];  Service: Cardiovascular;  Laterality: N/A;  . CARDIAC CATHETERIZATION N/A 12/22/2015   Procedure: Coronary Stent Intervention;  Surgeon: Sherren Mocha, MD;  Location: [facility];  Service: Cardiovascular;  Laterality: N/A;  . CARDIAC CATHETERIZATION N/A 12/22/2015   Procedure: Intravascular Pressure Wire/FFR Study;  Surgeon: Sherren Mocha, MD;  Location: [facility];  Service: Cardiovascular;  Laterality: N/A;  . CARDIAC CATHETERIZATION N/A 03/13/2016   Procedure: Left Heart Cath and Coronary Angiography;  Surgeon: Sherren Mocha, MD;  Location: [facility];  Service: Cardiovascular;  Laterality: N/A;  . COLONOSCOPY  2002  . CORONARY ANGIOPLASTY WITH STENT PLACEMENT  ~ 2013   "1 stent"  . ESOPHAGOGASTRODUODENOSCOPY (EGD) WITH ESOPHAGEAL DILATION  2002  . KNEE ARTHROSCOPY Left 1990s    Social History   Social History  . Marital status: Married    Spouse name: N/A  . Number of children: N/A  . Years of education: N/A   Social History Main Topics  . Smoking status: Never Smoker  . Smokeless tobacco: Never Used  . Alcohol use Yes     Comment: drank some in my 20's"  . Drug use: No  . Sexual activity: Yes   Other Topics Concern  . Not on file   Social  History Narrative  . No narrative on file    Family History  Problem Relation Age of Onset  . Cancer Mother     lung cancer  . Diabetes Mother   . Hyperlipidemia Mother   . Stroke Father     age 33  . Cancer Sister     ?lymphoma    Review of Systems  Constitutional: Positive for appetite change (decreased). Negative for fever.  Respiratory: Positive for cough (dry ) and wheezing (rare). Negative for shortness of breath.        Hiccups  Cardiovascular: Positive for leg swelling (mild). Negative for chest pain (from hiccups) and palpitations.  Gastrointestinal: Negative for abdominal pain.       Denies gerd  Neurological: Positive for dizziness (  mild, occ). Negative for light-headedness and headaches.       Objective:   Vitals:   09/08/16 1552  BP: 110/64  Pulse: 83  Resp: 16  Temp: 97.9 F (36.6 C)   Filed Weights   09/08/16 1552  Weight: 193 lb (87.5 kg)   Body mass index is 25.46 kg/m.  Wt Readings from Last 3 Encounters:  09/08/16 193 lb (87.5 kg)  09/05/16 197 lb (89.4 kg)  08/31/16 197 lb 12 oz (89.7 kg)     Physical Exam  Constitutional: He appears well-developed and well-nourished. No distress (hiccupping intermittently).  HENT:  Head: Normocephalic and atraumatic.  Eyes: Conjunctivae are normal.  Neck: Neck supple. No thyromegaly present.  Cardiovascular: Normal rate and regular rhythm.   Pulmonary/Chest: Effort normal. No respiratory distress. He has no wheezes. He has no rales.  Decreased BS at right base  Musculoskeletal: He exhibits no edema.  Lymphadenopathy:    He has no cervical adenopathy.  Skin: Skin is warm and dry. He is not diaphoretic.          Assessment & Plan:   See Problem List for Assessment and Plan of chronic medical problems.

## 2016-09-08 ENCOUNTER — Ambulatory Visit (INDEPENDENT_AMBULATORY_CARE_PROVIDER_SITE_OTHER): Payer: Medicare HMO | Admitting: Internal Medicine

## 2016-09-08 ENCOUNTER — Other Ambulatory Visit: Payer: Self-pay | Admitting: Internal Medicine

## 2016-09-08 VITALS — BP 110/64 | HR 83 | Temp 97.9°F | Resp 16 | Wt 193.0 lb

## 2016-09-08 DIAGNOSIS — R066 Hiccough: Secondary | ICD-10-CM | POA: Diagnosis not present

## 2016-09-08 DIAGNOSIS — J90 Pleural effusion, not elsewhere classified: Secondary | ICD-10-CM | POA: Diagnosis not present

## 2016-09-08 DIAGNOSIS — J189 Pneumonia, unspecified organism: Secondary | ICD-10-CM | POA: Diagnosis not present

## 2016-09-08 MED ORDER — BACLOFEN 20 MG PO TABS: 20.0000 mg | ORAL_TABLET | Freq: Three times a day (TID) | ORAL | 0 refills | Status: DC

## 2016-09-08 NOTE — Patient Instructions (Signed)
Hold metoclopramide (reglan) - try Baclofen three times a day for the hiccups.   Take benadryl at night.    Have repeat chest xray in 3 weeks  A referral was ordered for pulmonary.

## 2016-09-08 NOTE — Progress Notes (Signed)
Pre visit review using our clinic review tool, if applicable. No additional management support is needed unless otherwise documented below in the visit note. 

## 2016-09-09 ENCOUNTER — Encounter: Payer: Self-pay | Admitting: Internal Medicine

## 2016-09-09 NOTE — Assessment & Plan Note (Signed)
He denies GERD Likely related to elevated diaphragm and recent PNA reglan has helped and he feels they have gotten better, but still hiccupping Benadryl at night Hold reglan, try baclofen - if not effective return to reglan Can try thorazine if needed

## 2016-09-09 NOTE — Assessment & Plan Note (Signed)
Related to CAP Will order follow up CXR

## 2016-09-09 NOTE — Assessment & Plan Note (Signed)
Symptoms improved, but still with residual dry cough, occ wheeze and dec appetite, in addition to hiccups Repeat cxr in 2-3 weeks Will refer to pulmonary - in case pna does not resolve  - may need Ct of chest

## 2016-09-11 NOTE — Telephone Encounter (Signed)
Please advise if pt can have 90 day supply?

## 2016-09-12 ENCOUNTER — Inpatient Hospital Stay: Payer: PRIVATE HEALTH INSURANCE | Admitting: Internal Medicine

## 2016-09-12 ENCOUNTER — Other Ambulatory Visit: Payer: Self-pay | Admitting: Cardiovascular Disease

## 2016-09-21 ENCOUNTER — Ambulatory Visit (INDEPENDENT_AMBULATORY_CARE_PROVIDER_SITE_OTHER)
Admission: RE | Admit: 2016-09-21 | Discharge: 2016-09-21 | Disposition: A | Discharge status: Home / Self Care | Payer: Medicare HMO | Source: Ambulatory Visit | Attending: Internal Medicine | Admitting: Internal Medicine

## 2016-09-21 DIAGNOSIS — J90 Pleural effusion, not elsewhere classified: Secondary | ICD-10-CM

## 2016-09-21 DIAGNOSIS — J189 Pneumonia, unspecified organism: Secondary | ICD-10-CM

## 2016-09-22 ENCOUNTER — Ambulatory Visit (INDEPENDENT_AMBULATORY_CARE_PROVIDER_SITE_OTHER): Payer: Medicare HMO | Admitting: Emergency Medicine

## 2016-09-22 ENCOUNTER — Encounter: Payer: Self-pay | Admitting: Emergency Medicine

## 2016-09-22 ENCOUNTER — Telehealth: Payer: Self-pay | Admitting: Internal Medicine

## 2016-09-22 DIAGNOSIS — J189 Pneumonia, unspecified organism: Secondary | ICD-10-CM

## 2016-09-22 NOTE — Telephone Encounter (Signed)
noted 

## 2016-09-22 NOTE — Telephone Encounter (Signed)
Pt informed of results. Pt stated understanding about returning in 3 weeks for a repeat cxr. Pt stated that he is feeling better.

## 2016-09-22 NOTE — Patient Instructions (Signed)
Your chest x-ray shows improvement in your right basilar pneumonia. The pleural fluid surrounding the lung has resolved on your most recent film. This is good news. I believe that you are recovering from the pneumonia at the expected pace. Work on slowly building back up your stamina. It may take a few months before you feel completely back to normal. If your improvement plateaus or if you worsen in any way please contact either Dr Quay Burow or Dr Lamonte Sakai to discuss.

## 2016-09-22 NOTE — Assessment & Plan Note (Signed)
Recent right middle lobe community aquired pneumonia with an associated small right pleural effusion. He is clinically improving. A chest x-ray performed on 09/21/16 was reviewed and shows almost full resolution of his right middle lobe infiltrate and full resolution of his pleural effusion. I don't think there is any role for further imaging or thoracentesis. I believe he is improving at the expected pace. If he backtracks or plateaus then we will need to repeat his chest x-ray to ensure that there is no pleural fluid reaccumulation.  Your chest x-ray shows improvement in your right basilar pneumonia. The pleural fluid surrounding the lung has resolved on your most recent film. This is good news. I believe that you are recovering from the pneumonia at the expected pace. Work on slowly building back up your stamina. It may take a few months before you feel completely back to normal. If your improvement plateaus or if you worsen in any way please contact either Dr Quay Burow or Dr Lamonte Sakai to discuss.

## 2016-09-22 NOTE — Progress Notes (Signed)
Subjective:    Patient ID: Austin [name], male    DOB: 1945/12/18, 71 y.o.   MRN: [id number]  HPI 71 year old man, never smoker, with a history of coronary artery disease and PTCI, CLL, allergic rhinitis, TIA. He was admitted to [facility] from 1/1-08/31/16 for RML CAP with an associated effusion. He states that he had malaise, chills, malaise, no appetite, cough non-productive. He was also noted to have an enlarged prostate, underwent cystoscopy as an outpt. He has plans to follow further with Urology.  He is feeling better but is not completely back to baseline. No cough, no CP. No SOB but he does have some continued decreased stamina.   Review of Systems  Constitutional: Negative.  Negative for fever and unexpected weight change.  HENT: Negative.  Negative for congestion, dental problem, ear pain, nosebleeds, postnasal drip, rhinorrhea, sinus pressure, sneezing, sore throat and trouble swallowing.   Eyes: Negative.  Negative for redness and itching.  Respiratory: Negative.  Negative for cough, chest tightness, shortness of breath and wheezing.   Cardiovascular: Negative.  Negative for palpitations and leg swelling.  Gastrointestinal: Negative.  Negative for nausea and vomiting.  Genitourinary: Negative.  Negative for dysuria.  Musculoskeletal: Negative.  Negative for joint swelling.  Skin: Negative.  Negative for rash.  Neurological: Negative.  Negative for headaches.  Hematological: Bruises/bleeds easily.  Psychiatric/Behavioral: Negative.  Negative for dysphoric mood. The patient is not nervous/anxious.    Past Medical History:  Diagnosis Date  . Allergy   . CAP (community acquired pneumonia)   . Coronary artery disease    03/13/2016 DES to first OM, EF 55%  . Heart murmur    "outgrew it" (03/13/2016)  . Hematuria   . HTN (hypertension)   . Hx of cardiovascular stress test    ETT-Myoview (10/15): Nondiagnostic EKG changes, anterolateral and inferolateral scar, no ischemia, EF 48%,  low risk  . Hyperlipemia   . Hyperlipidemia    NMR Lipoprofile 2008: LDL 168 ( 2410/ 1826), HDL 41, TG 71. LDL goal = <100, ideally <  70. Framingham Study LDL goal = < 130.  Marland Kitchen Kidney stones    "passed them"; Dr. Terance Hart (03/13/2016)  . Myocardial infarction 2001  . Non Hodgkin's lymphoma ([place])    [facility]  . Pleural effusion   . TIA (transient ischemic attack) 1980s   "I was exercising when I had it"     Family History  Problem Relation Age of Onset  . Cancer Mother     lung cancer  . Diabetes Mother   . Hyperlipidemia Mother   . Stroke Father     age 8  . Cancer Sister     ?lymphoma     Social History   Social History  . Marital status: Married    Spouse name: N/A  . Number of children: N/A  . Years of education: N/A   Occupational History  . Not on file.   Social History Main Topics  . Smoking status: Never Smoker  . Smokeless tobacco: Never Used  . Alcohol use Yes     Comment: drank some in my 20's"  . Drug use: No  . Sexual activity: Yes   Other Topics Concern  . Not on file   Social History Narrative  . No narrative on file     Allergies  Allergen Reactions  . Zetia [Ezetimibe] Shortness Of Breath and Other (See Comments)    Chest pain  . Crestor [Rosuvastatin] Other (See Comments)  Myalgias  . Pravachol [Pravastatin Sodium] Other (See Comments)    Myalgias  . Isosorbide Mononitrate [Isosorbide Nitrate] Other (See Comments)    Headache, throat soreness and hoarse  . Silver Rash     Outpatient Medications Prior to Visit  Medication Sig Dispense Refill  . amLODipine (NORVASC) 5 MG tablet TAKE 1 TABLET ONE TIME DAILY 90 tablet 3  . aspirin 81 MG tablet Take 81 mg by mouth daily.    . baclofen (LIORESAL) 20 MG tablet Take 1 tablet (20 mg total) by mouth 3 (three) times daily. 30 each 0  . clopidogrel (PLAVIX) 75 MG tablet TAKE 1 TABLET(75 MG) BY MOUTH DAILY 90 tablet 3  . dorzolamide-timolol (COSOPT) 22.3-6.8 MG/ML ophthalmic  solution Place 1 drop into both eyes 2 (two) times daily.    Marland Kitchen doxazosin (CARDURA) 2 MG tablet TAKE 1 TABLET (2 MG TOTAL) BY MOUTH DAILY. 90 tablet 3  . metoprolol tartrate (LOPRESSOR) 25 MG tablet TAKE 1/2 TABLET TWICE DAILY 90 tablet 3  . nitroGLYCERIN (NITROSTAT) 0.4 MG SL tablet PLACE AND DISSOLVE 1 TABLET UNDER THE TONGUE EVERY 5 MINUTES AS NEEDED FOR CHEST PAIN 75 tablet 3  . timolol (BETIMOL) 0.25 % ophthalmic solution Place 1 drop into both eyes 2 (two) times daily.     . metoCLOPramide (REGLAN) 10 MG tablet Take 1 tablet (10 mg total) by mouth every 8 (eight) hours as needed for nausea. (Patient not taking: Reported on 09/22/2016) 21 tablet 0   No facility-administered medications prior to visit.         Objective:   Physical Exam Vitals:   09/22/16 1525  BP: 102/70  Pulse: 82  SpO2: 98%  Weight: 199 lb (90.3 kg)  Height: 6\' 1"  (1.854 m)   Gen: Pleasant, well-nourished, in no distress,  normal affect  ENT: No lesions,  mouth clear,  oropharynx clear, no postnasal drip  Neck: No JVD, no TMG, no carotid bruits  Lungs: No use of accessory muscles, decreased at the R base, clear without rales or rhonchi  Cardiovascular: RRR, heart sounds normal, no murmur or gallops, no peripheral edema  Musculoskeletal: No deformities, no cyanosis or clubbing  Neuro: alert, non focal  Skin: Warm, no lesions or rashes    Assessment & Plan:  CAP (community acquired pneumonia) Recent right middle lobe community aquired pneumonia with an associated small right pleural effusion. He is clinically improving. A chest x-ray performed on 09/21/16 was reviewed and shows almost full resolution of his right middle lobe infiltrate and full resolution of his pleural effusion. I don't think there is any role for further imaging or thoracentesis. I believe he is improving at the expected pace. If he backtracks or plateaus then we will need to repeat his chest x-ray to ensure that there is no pleural fluid  reaccumulation.  Your chest x-ray shows improvement in your right basilar pneumonia. The pleural fluid surrounding the lung has resolved on your most recent film. This is good news. I believe that you are recovering from the pneumonia at the expected pace. Work on slowly building back up your stamina. It may take a few months before you feel completely back to normal. If your improvement plateaus or if you worsen in any way please contact either Dr Quay Burow or Dr Lamonte Sakai to discuss.  Baltazar Apo, MD, PhD 09/22/2016, 3:54 PM [facility] [phone] or if no answer [phone]

## 2016-09-22 NOTE — Telephone Encounter (Signed)
Let him know his chest xray shows that his pneumonia has resolved.  His diaphragm is still elevated.  He needs to have another repeat chest xray in 3 weeks - I have ordered this.  Please ask how he is feeling.

## 2016-10-04 ENCOUNTER — Ambulatory Visit: Payer: Medicare HMO | Admitting: Internal Medicine

## 2016-10-07 ENCOUNTER — Other Ambulatory Visit: Payer: Self-pay | Admitting: Cardiovascular Disease

## 2016-10-16 DIAGNOSIS — R310 Gross hematuria: Secondary | ICD-10-CM | POA: Diagnosis not present

## 2016-10-23 DIAGNOSIS — R3915 Urgency of urination: Secondary | ICD-10-CM | POA: Diagnosis not present

## 2016-10-23 DIAGNOSIS — R972 Elevated prostate specific antigen [PSA]: Secondary | ICD-10-CM | POA: Diagnosis not present

## 2016-10-23 DIAGNOSIS — N401 Enlarged prostate with lower urinary tract symptoms: Secondary | ICD-10-CM | POA: Diagnosis not present

## 2016-11-08 ENCOUNTER — Ambulatory Visit (INDEPENDENT_AMBULATORY_CARE_PROVIDER_SITE_OTHER): Payer: Medicare HMO | Admitting: Physician Assistant

## 2016-11-08 ENCOUNTER — Encounter: Payer: Self-pay | Admitting: Physician Assistant

## 2016-11-08 VITALS — BP 130/70 | HR 80 | Ht 73.0 in | Wt 202.1 lb

## 2016-11-08 DIAGNOSIS — E785 Hyperlipidemia, unspecified: Secondary | ICD-10-CM | POA: Diagnosis not present

## 2016-11-08 DIAGNOSIS — I10 Essential (primary) hypertension: Secondary | ICD-10-CM

## 2016-11-08 DIAGNOSIS — I25119 Atherosclerotic heart disease of native coronary artery with unspecified angina pectoris: Secondary | ICD-10-CM | POA: Diagnosis not present

## 2016-11-08 NOTE — Progress Notes (Signed)
Cardiology Office Note:    Date:  11/08/2016   ID:  Austin [name], DOB [birth year], MRN [id number]  PCP:  Binnie Rail, MD  Cardiologist:  Dr. Sherren Mocha   Electrophysiologist:  n/a  Referring MD: Binnie Rail, MD   Chief Complaint  Patient presents with  . Follow-up    CAD    History of Present Illness:    Austin [name] is a 71 y.o. male with a hx of CAD status post lateral wall MI in 2012 treated with PCI of the LCx with a BMS, follicular lymphoma, HTN, HL. LHC in 4/17 demonstrated severe proximal LAD stenosis and continued patency of the stented segment OM branch with moderate stenosis of the branch before the stented segment and patent RCA with mild diffuse disease. FFR of disease and OM was not hemodynamically significant. He was treated with PCI with DES to the proximal LAD. He had recurrent anginal symptoms in 6/17 and failed medical therapy. Repeat cardiac catheterization demonstrated severe de novo stenosis of OM1 which was treated with a DES. Proximal LAD stent and previous OM1 stents were both patent. Severe small vessel disease with distal PDA, diagonal branches of LAD and apical portion of the LAD was again demonstrated. Last seen by Dr. Burt Knack 9/17. He was doing well at that time without recurrent anginal symptoms on current medical therapy.  He was admitted 1/18 with pneumonia complicated by AKI. He did have minimally elevated troponin levels without clear trend. Cardiology was not consulted. Echo demonstrated normal LV function.  He returns for follow-up. He is here alone. He is feeling better. He denies any residual cough. He denies shortness of breath. He denies chest discomfort. He denies syncope, orthopnea, edema. He's working out a couple of times a week without difficulty.  Prior CV studies:   The following studies were reviewed today:  Echo 1/18 EF 29-51, grade 1 diastolic dysfunction, trivial AI, dilated aortic root (42), normal RVSF, trivial  pericardial effusion  LHC7/17/17 LAD proximal stent patent, distal 90 (unchanged from previous study), lateral D1 90-unchanged from previous study; D2 90-unchanged in previous study LCx mid 100-CTO; OM1 95 (de novo), stent patent with 25 ISR RPDA ostial 50, 80 Normal EF 55 PCI: 2.75 x 18 mm Xience Alpine DES to OM1        [facility] 12/22/15 LAD prox 90%, dist 90%; D1 95% LCx - OM1 60% (The resting FFR is 0.98 and the FFR at peak hyperemia is 0.87), stent patent RCA prox 25%, dist 60%, RPDA 95% PCI 4 x 15 Resolute DES to pLAD 1. Severe proximal LAD stenosis, treated successfully with PCI using a drug-eluting stent 2. Continued patency of the stented segment in the obtuse marginal branch with moderate stenosis of that branch before the stent, negative pressure wire analysis 3. Patent RCA with mild diffuse disease 4. Significant small vessel coronary artery disease with severe stenoses of the PDA branch, diagonal subbranch, and apical LAD, all appropriate for medical therapy 5. Normal LV function by echo Recommend: Aggressive medical therapy for her residual CAD, continue dual antiplatelet therapy with aspirin and Plavix.  ETT-Echo 12/08/15 Impressions: Abnormal study after maximal exercise with reproduction of symptoms. Study suggests myocardial ischemia in the inferolateral and anteroseptal walls.  Myoview 10/15 Overall Impression: The study is abnormal. This is a low risk scan. There is mild reduction in the ejection fraction. There is a medium-sized scar of moderate severity affecting the anterolateral wall and the mid-inferolateral wall. There is no definite ischemia.  LV Ejection Fraction: 48%. LV Wall Motion: Mild hypokinesis of the lateral wall.  Echo 5/08 EF 55-65%, normal wall motion, mild aortic root dilatation, mild LAE  Past Medical History:  Diagnosis Date  . Allergy   . CAP (community acquired pneumonia)   . Coronary artery disease    03/13/2016 DES to first OM, EF 55%    . Heart murmur    "outgrew it" (03/13/2016)  . Hematuria   . HTN (hypertension)   . Hx of cardiovascular stress test    ETT-Myoview (10/15): Nondiagnostic EKG changes, anterolateral and inferolateral scar, no ischemia, EF 48%, low risk  . Hyperlipemia   . Hyperlipidemia    NMR Lipoprofile 2008: LDL 168 ( 2410/ 1826), HDL 41, TG 71. LDL goal = <100, ideally <  70. Framingham Study LDL goal = < 130.  Marland Kitchen Kidney stones    "passed them"; Dr. Terance Hart (03/13/2016)  . Myocardial infarction 2001  . Non Hodgkin's lymphoma ([place])    [facility]  . Pleural effusion   . TIA (transient ischemic attack) 1980s   "I was exercising when I had it"    Past Surgical History:  Procedure Laterality Date  . CARDIAC CATHETERIZATION N/A 12/22/2015   Procedure: Left Heart Cath and Coronary Angiography;  Surgeon: Sherren Mocha, MD;  Location: [place] [facility];  Service: Cardiovascular;  Laterality: N/A;  . CARDIAC CATHETERIZATION N/A 12/22/2015   Procedure: Coronary Stent Intervention;  Surgeon: Sherren Mocha, MD;  Location: [place] [facility];  Service: Cardiovascular;  Laterality: N/A;  . CARDIAC CATHETERIZATION N/A 12/22/2015   Procedure: Intravascular Pressure Wire/FFR Study;  Surgeon: Sherren Mocha, MD;  Location: [place] [facility];  Service: Cardiovascular;  Laterality: N/A;  . CARDIAC CATHETERIZATION N/A 03/13/2016   Procedure: Left Heart Cath and Coronary Angiography;  Surgeon: Sherren Mocha, MD;  Location: [place] [facility];  Service: Cardiovascular;  Laterality: N/A;  . COLONOSCOPY  2002  . CORONARY ANGIOPLASTY WITH STENT PLACEMENT  ~ 2013   "1 stent"  . ESOPHAGOGASTRODUODENOSCOPY (EGD) WITH ESOPHAGEAL DILATION  2002  . KNEE ARTHROSCOPY Left 1990s    Current Medications: Current Meds  Medication Sig  . amLODipine (NORVASC) 5 MG tablet TAKE 1 TABLET ONE TIME DAILY  . aspirin 81 MG tablet Take 81 mg by mouth daily.  . baclofen (LIORESAL) 20 MG tablet Take 1 tablet (20 mg total) by  mouth 3 (three) times daily.  . clopidogrel (PLAVIX) 75 MG tablet TAKE 1 TABLET(75 MG) BY MOUTH DAILY  . dorzolamide-timolol (COSOPT) 22.3-6.8 MG/ML ophthalmic solution Place 1 drop into both eyes 2 (two) times daily.  Marland Kitchen doxazosin (CARDURA) 2 MG tablet TAKE 1 TABLET (2 MG TOTAL) BY MOUTH DAILY.  Marland Kitchen metoCLOPramide (REGLAN) 10 MG tablet Take 1 tablet (10 mg total) by mouth every 8 (eight) hours as needed for nausea.  . metoprolol tartrate (LOPRESSOR) 25 MG tablet TAKE 1/2 TABLET TWICE DAILY  . nitroGLYCERIN (NITROSTAT) 0.4 MG SL tablet PLACE AND DISSOLVE 1 TABLET UNDER THE TONGUE EVERY 5 MINUTES AS NEEDED FOR CHEST PAIN  . timolol (BETIMOL) 0.25 % ophthalmic solution Place 1 drop into both eyes 2 (two) times daily.      Allergies:   Zetia [ezetimibe]; Crestor [rosuvastatin]; Pravachol [pravastatin sodium]; Isosorbide mononitrate [isosorbide nitrate]; and Silver   Social History   Social History  . Marital status: Married    Spouse name: N/A  . Number of children: N/A  . Years of education: N/A   Social History Main Topics  .  Smoking status: Never Smoker  . Smokeless tobacco: Never Used  . Alcohol use Yes     Comment: drank some in my 20's"  . Drug use: No  . Sexual activity: Yes   Other Topics Concern  . None   Social History Narrative  . None     Family History  Problem Relation Age of Onset  . Cancer Mother     lung cancer  . Diabetes Mother   . Hyperlipidemia Mother   . Stroke Father     age 48  . Cancer Sister     ?lymphoma     ROS:   Please see the history of present illness.    ROS All other systems reviewed and are negative.   EKGs/Labs/Other Test Reviewed:    EKG:  EKG is not ordered today.  The ekg ordered today demonstrates n/a  Recent Labs: 08/29/2016: ALT 18 09/05/2016: BUN 13; Creatinine, Ser 1.08; Hemoglobin 11.2; Platelets 425; Potassium 4.6; Sodium 135   Recent Lipid Panel    Component Value Date/Time   CHOL 232 (H) 02/08/2016 1009   TRIG 91  02/08/2016 1009   HDL 48 02/08/2016 1009   CHOLHDL 4.8 02/08/2016 1009   VLDL 18 02/08/2016 1009   LDLCALC 166 (H) 02/08/2016 1009   LDLDIRECT 173.2 07/21/2013 0809     Physical Exam:    VS:  BP 130/70 (BP Location: Right Arm, Patient Position: Sitting, Cuff Size: Normal)   Pulse 80   Ht 6\' 1"  (1.854 m)   Wt 202 lb 1.9 oz (91.7 kg)   BMI 26.67 kg/m     Wt Readings from Last 3 Encounters:  11/08/16 202 lb 1.9 oz (91.7 kg)  09/22/16 199 lb (90.3 kg)  09/08/16 193 lb (87.5 kg)     Physical Exam  Constitutional: He is oriented to person, place, and time. He appears well-developed and well-nourished. No distress.  HENT:  Head: Normocephalic and atraumatic.  Eyes: No scleral icterus.  Neck: No JVD present.  Cardiovascular: Normal rate and regular rhythm.   No murmur heard. Pulmonary/Chest: He has no wheezes. He has no rales.  Abdominal: There is no tenderness.  Musculoskeletal: He exhibits no edema.  Neurological: He is alert and oriented to person, place, and time.  Skin: Skin is warm and dry.  Psychiatric: He has a normal mood and affect.    ASSESSMENT:    1. Coronary artery disease involving native coronary artery of native heart with angina pectoris ([place])   2. Essential hypertension   3. Hyperlipidemia, unspecified hyperlipidemia type    PLAN:    In order of problems listed above:  1. Coronary artery disease involving native coronary artery of native heart with angina pectoris [facility]) -  Status post multiple PCI procedures with most recent being in 7/17 with a DES to the OM1. He also has distal vessel disease that is treated medically. He is stable without recurrent angina on amlodipine, aspirin, Plavix, metoprolol. Continue current therapy.  2. Essential hypertension -  Blood pressure is well controlled. Continue current therapy.  3. Hyperlipidemia, unspecified hyperlipidemia type -  He is intolerant to statins. He was enrolled for a research trial but this was  placed on hold when he developed pneumonia. Research has notified him that he will be contacted again this month.  Dispo:  Return in about 6 months (around 05/11/2017) for Routine Follow Up, w/ Dr. Burt Knack.   Medication Adjustments/Labs and Tests Ordered: Current medicines are reviewed at length with the patient  today.  Concerns regarding medicines are outlined above.  Medication changes, Labs and Tests ordered today are outlined in the Patient Instructions noted below. Patient Instructions  Medication Instructions:  None ordered  Labwork: None ordered  Testing/Procedures: None ordered   Follow-Up: Your physician wants you to follow-up in: 6 months with Dr. Burt Knack. You will receive a reminder letter in the mail two months in advance. If you don't receive a letter, please call our office to schedule the follow-up appointment.  Any Other Special Instructions Will Be Listed Below (If Applicable).  If you need a refill on your cardiac medications before your next appointment, please call your pharmacy.  Signed, Richardson Dopp, PA-C  11/08/2016 9:49 AM    [facility] [address], [place], [place]  [address] Phone: [phone]; Fax: [phone]

## 2016-11-08 NOTE — Patient Instructions (Addendum)
Medication Instructions:  None ordered  Labwork: None ordered  Testing/Procedures: None ordered   Follow-Up: Your physician wants you to follow-up in: 6 months with Dr. Burt Knack. You will receive a reminder letter in the mail two months in advance. If you don't receive a letter, please call our office to schedule the follow-up appointment.  Any Other Special Instructions Will Be Listed Below (If Applicable).  If you need a refill on your cardiac medications before your next appointment, please call your pharmacy.

## 2016-11-11 ENCOUNTER — Encounter (HOSPITAL_COMMUNITY): Payer: Self-pay | Admitting: Emergency Medicine

## 2016-11-11 ENCOUNTER — Inpatient Hospital Stay (HOSPITAL_COMMUNITY)
Admission: EM | Admit: 2016-11-11 | Discharge: 2016-11-14 | DRG: 312 | Disposition: A | Discharge status: Home / Self Care | Payer: Medicare HMO | Attending: Family Medicine | Admitting: Family Medicine

## 2016-11-11 ENCOUNTER — Emergency Department (HOSPITAL_COMMUNITY): Payer: Medicare HMO

## 2016-11-11 DIAGNOSIS — Z955 Presence of coronary angioplasty implant and graft: Secondary | ICD-10-CM

## 2016-11-11 DIAGNOSIS — R55 Syncope and collapse: Secondary | ICD-10-CM | POA: Diagnosis present

## 2016-11-11 DIAGNOSIS — I10 Essential (primary) hypertension: Secondary | ICD-10-CM | POA: Diagnosis present

## 2016-11-11 DIAGNOSIS — Z833 Family history of diabetes mellitus: Secondary | ICD-10-CM | POA: Diagnosis not present

## 2016-11-11 DIAGNOSIS — Z8673 Personal history of transient ischemic attack (TIA), and cerebral infarction without residual deficits: Secondary | ICD-10-CM

## 2016-11-11 DIAGNOSIS — E782 Mixed hyperlipidemia: Secondary | ICD-10-CM | POA: Diagnosis present

## 2016-11-11 DIAGNOSIS — R404 Transient alteration of awareness: Secondary | ICD-10-CM | POA: Diagnosis not present

## 2016-11-11 DIAGNOSIS — Z8572 Personal history of non-Hodgkin lymphomas: Secondary | ICD-10-CM

## 2016-11-11 DIAGNOSIS — Z888 Allergy status to other drugs, medicaments and biological substances status: Secondary | ICD-10-CM

## 2016-11-11 DIAGNOSIS — R001 Bradycardia, unspecified: Secondary | ICD-10-CM | POA: Diagnosis not present

## 2016-11-11 DIAGNOSIS — J309 Allergic rhinitis, unspecified: Secondary | ICD-10-CM | POA: Diagnosis present

## 2016-11-11 DIAGNOSIS — I252 Old myocardial infarction: Secondary | ICD-10-CM | POA: Diagnosis not present

## 2016-11-11 DIAGNOSIS — Z7982 Long term (current) use of aspirin: Secondary | ICD-10-CM

## 2016-11-11 DIAGNOSIS — E785 Hyperlipidemia, unspecified: Secondary | ICD-10-CM | POA: Diagnosis not present

## 2016-11-11 DIAGNOSIS — I25118 Atherosclerotic heart disease of native coronary artery with other forms of angina pectoris: Secondary | ICD-10-CM | POA: Diagnosis present

## 2016-11-11 DIAGNOSIS — J9811 Atelectasis: Secondary | ICD-10-CM | POA: Diagnosis not present

## 2016-11-11 DIAGNOSIS — Z823 Family history of stroke: Secondary | ICD-10-CM | POA: Diagnosis not present

## 2016-11-11 DIAGNOSIS — I5032 Chronic diastolic (congestive) heart failure: Secondary | ICD-10-CM | POA: Diagnosis present

## 2016-11-11 DIAGNOSIS — Z7902 Long term (current) use of antithrombotics/antiplatelets: Secondary | ICD-10-CM | POA: Diagnosis not present

## 2016-11-11 DIAGNOSIS — J101 Influenza due to other identified influenza virus with other respiratory manifestations: Secondary | ICD-10-CM | POA: Diagnosis present

## 2016-11-11 DIAGNOSIS — I951 Orthostatic hypotension: Secondary | ICD-10-CM | POA: Diagnosis not present

## 2016-11-11 DIAGNOSIS — I25119 Atherosclerotic heart disease of native coronary artery with unspecified angina pectoris: Secondary | ICD-10-CM | POA: Diagnosis not present

## 2016-11-11 DIAGNOSIS — D649 Anemia, unspecified: Secondary | ICD-10-CM | POA: Diagnosis not present

## 2016-11-11 DIAGNOSIS — I2510 Atherosclerotic heart disease of native coronary artery without angina pectoris: Secondary | ICD-10-CM | POA: Diagnosis not present

## 2016-11-11 DIAGNOSIS — Z801 Family history of malignant neoplasm of trachea, bronchus and lung: Secondary | ICD-10-CM | POA: Diagnosis not present

## 2016-11-11 DIAGNOSIS — I110 Hypertensive heart disease with heart failure: Secondary | ICD-10-CM | POA: Diagnosis not present

## 2016-11-11 DIAGNOSIS — D696 Thrombocytopenia, unspecified: Secondary | ICD-10-CM | POA: Diagnosis present

## 2016-11-11 DIAGNOSIS — Z8571 Personal history of Hodgkin lymphoma: Secondary | ICD-10-CM | POA: Diagnosis not present

## 2016-11-11 DIAGNOSIS — R059 Cough, unspecified: Secondary | ICD-10-CM

## 2016-11-11 DIAGNOSIS — N400 Enlarged prostate without lower urinary tract symptoms: Secondary | ICD-10-CM | POA: Diagnosis present

## 2016-11-11 DIAGNOSIS — R05 Cough: Secondary | ICD-10-CM

## 2016-11-11 DIAGNOSIS — D72819 Decreased white blood cell count, unspecified: Secondary | ICD-10-CM | POA: Diagnosis present

## 2016-11-11 DIAGNOSIS — R531 Weakness: Secondary | ICD-10-CM | POA: Diagnosis not present

## 2016-11-11 DIAGNOSIS — R509 Fever, unspecified: Secondary | ICD-10-CM

## 2016-11-11 LAB — COMPREHENSIVE METABOLIC PANEL
ALBUMIN: 3.6 g/dL (ref 3.5–5.0)
ALT: 14 U/L — ABNORMAL LOW (ref 17–63)
ANION GAP: 9 (ref 5–15)
AST: 20 U/L (ref 15–41)
Alkaline Phosphatase: 69 U/L (ref 38–126)
BILIRUBIN TOTAL: 0.9 mg/dL (ref 0.3–1.2)
BUN: 13 mg/dL (ref 6–20)
CO2: 27 mmol/L (ref 22–32)
Calcium: 9.2 mg/dL (ref 8.9–10.3)
Chloride: 101 mmol/L (ref 101–111)
Creatinine, Ser: 1.38 mg/dL — ABNORMAL HIGH (ref 0.61–1.24)
GFR calc Af Amer: 58 mL/min — ABNORMAL LOW (ref >60)
GFR calc non Af Amer: 50 mL/min — ABNORMAL LOW (ref >60)
GLUCOSE: 151 mg/dL — AB (ref 65–99)
Potassium: 3.6 mmol/L (ref 3.5–5.1)
Sodium: 137 mmol/L (ref 135–145)
TOTAL PROTEIN: 6.5 g/dL (ref 6.5–8.1)

## 2016-11-11 LAB — URINALYSIS, ROUTINE W REFLEX MICROSCOPIC
BILIRUBIN URINE: NEGATIVE
Bacteria, UA: NONE SEEN
Glucose, UA: NEGATIVE mg/dL
KETONES UR: NEGATIVE mg/dL
Leukocytes, UA: NEGATIVE
NITRITE: NEGATIVE
Protein, ur: NEGATIVE mg/dL
Specific Gravity, Urine: 1.01 (ref 1.005–1.030)
pH: 5 (ref 5.0–8.0)

## 2016-11-11 LAB — CBC
HCT: 34.2 % — ABNORMAL LOW (ref 39.0–52.0)
Hemoglobin: 11.9 g/dL — ABNORMAL LOW (ref 13.0–17.0)
MCH: 30.4 pg (ref 26.0–34.0)
MCHC: 34.8 g/dL (ref 30.0–36.0)
MCV: 87.5 fL (ref 78.0–100.0)
PLATELETS: 155 10*3/uL (ref 150–400)
RBC: 3.91 MIL/uL — AB (ref 4.22–5.81)
RDW: 14 % (ref 11.5–15.5)
WBC: 3.5 10*3/uL — AB (ref 4.0–10.5)

## 2016-11-11 LAB — CBC WITH DIFFERENTIAL/PLATELET
BASOS PCT: 1 %
Basophils Absolute: 0 10*3/uL (ref 0.0–0.1)
EOS ABS: 0 10*3/uL (ref 0.0–0.7)
EOS PCT: 0 %
HEMATOCRIT: 31.8 % — AB (ref 39.0–52.0)
Hemoglobin: 10.8 g/dL — ABNORMAL LOW (ref 13.0–17.0)
Lymphocytes Relative: 25 %
Lymphs Abs: 0.8 10*3/uL (ref 0.7–4.0)
MCH: 29.8 pg (ref 26.0–34.0)
MCHC: 34 g/dL (ref 30.0–36.0)
MCV: 87.8 fL (ref 78.0–100.0)
MONO ABS: 0.4 10*3/uL (ref 0.1–1.0)
MONOS PCT: 11 %
NEUTROS ABS: 2.1 10*3/uL (ref 1.7–7.7)
Neutrophils Relative %: 63 %
Platelets: 149 10*3/uL — ABNORMAL LOW (ref 150–400)
RBC: 3.62 MIL/uL — ABNORMAL LOW (ref 4.22–5.81)
RDW: 14.1 % (ref 11.5–15.5)
WBC: 3.3 10*3/uL — ABNORMAL LOW (ref 4.0–10.5)

## 2016-11-11 LAB — CREATININE, SERUM
CREATININE: 1.11 mg/dL (ref 0.61–1.24)
GFR calc non Af Amer: >60 mL/min (ref >60)

## 2016-11-11 LAB — TROPONIN I

## 2016-11-11 LAB — I-STAT TROPONIN, ED: Troponin i, poc: 0.01 ng/mL (ref 0.00–0.08)

## 2016-11-11 MED ORDER — METOCLOPRAMIDE HCL 10 MG PO TABS: 10.0000 mg | ORAL_TABLET | Freq: Three times a day (TID) | ORAL | Status: DC | PRN

## 2016-11-11 MED ORDER — SODIUM CHLORIDE 0.9% FLUSH
3.0000 mL | Freq: Two times a day (BID) | INTRAVENOUS | Status: DC
  Administered 2016-11-11 – 2016-11-14 (×5): 3 mL via INTRAVENOUS

## 2016-11-11 MED ORDER — TIMOLOL MALEATE 0.25 % OP SOLN
1.0000 [drp] | Freq: Two times a day (BID) | OPHTHALMIC | Status: DC
  Administered 2016-11-11 – 2016-11-12 (×3): 1 [drp] via OPHTHALMIC
  Filled 2016-11-11: qty 5

## 2016-11-11 MED ORDER — BACLOFEN 20 MG PO TABS
20.0000 mg | ORAL_TABLET | Freq: Three times a day (TID) | ORAL | Status: DC
  Administered 2016-11-11 – 2016-11-14 (×8): 20 mg via ORAL
  Filled 2016-11-11 (×8): qty 1

## 2016-11-11 MED ORDER — DORZOLAMIDE HCL 2 % OP SOLN
1.0000 [drp] | Freq: Two times a day (BID) | OPHTHALMIC | Status: DC
  Administered 2016-11-11 – 2016-11-12 (×3): 1 [drp] via OPHTHALMIC
  Filled 2016-11-11: qty 10

## 2016-11-11 MED ORDER — DORZOLAMIDE HCL-TIMOLOL MAL 2-0.5 % OP SOLN
1.0000 [drp] | Freq: Two times a day (BID) | OPHTHALMIC | Status: DC
  Filled 2016-11-11 (×2): qty 10

## 2016-11-11 MED ORDER — ASPIRIN 81 MG PO TABS: 81.0000 mg | ORAL_TABLET | Freq: Every day | ORAL | Status: DC

## 2016-11-11 MED ORDER — SODIUM CHLORIDE 0.9 % IV BOLUS (SEPSIS)
500.0000 mL | Freq: Once | INTRAVENOUS | Status: AC
  Administered 2016-11-11: 500 mL via INTRAVENOUS

## 2016-11-11 MED ORDER — SODIUM CHLORIDE 0.9 % IV SOLN
INTRAVENOUS | Status: DC
  Administered 2016-11-11: 1000 mL via INTRAVENOUS

## 2016-11-11 MED ORDER — ASPIRIN 81 MG PO CHEW
81.0000 mg | CHEWABLE_TABLET | Freq: Every day | ORAL | Status: DC
  Administered 2016-11-12 – 2016-11-14 (×3): 81 mg via ORAL
  Filled 2016-11-11 (×3): qty 1

## 2016-11-11 MED ORDER — TIMOLOL HEMIHYDRATE 0.25 % OP SOLN: 1.0000 [drp] | Freq: Two times a day (BID) | OPHTHALMIC | Status: DC

## 2016-11-11 MED ORDER — HEPARIN SODIUM (PORCINE) 5000 UNIT/ML IJ SOLN
5000.0000 [IU] | Freq: Three times a day (TID) | INTRAMUSCULAR | Status: DC
  Administered 2016-11-11 – 2016-11-14 (×8): 5000 [IU] via SUBCUTANEOUS
  Filled 2016-11-11 (×8): qty 1

## 2016-11-11 MED ORDER — METOPROLOL TARTRATE 25 MG PO TABS
12.5000 mg | ORAL_TABLET | Freq: Two times a day (BID) | ORAL | Status: DC
  Administered 2016-11-11 – 2016-11-13 (×4): 12.5 mg via ORAL
  Filled 2016-11-11 (×4): qty 1

## 2016-11-11 MED ORDER — NITROGLYCERIN 0.4 MG SL SUBL: 0.4000 mg | SUBLINGUAL_TABLET | SUBLINGUAL | Status: DC | PRN

## 2016-11-11 MED ORDER — CLOPIDOGREL BISULFATE 75 MG PO TABS
75.0000 mg | ORAL_TABLET | Freq: Every day | ORAL | Status: DC
  Administered 2016-11-12 – 2016-11-14 (×3): 75 mg via ORAL
  Filled 2016-11-11 (×3): qty 1

## 2016-11-11 NOTE — H&P (Signed)
History and Physical    Austin [name] WCB:[id number] DOB: [birth year] DOA: 11/11/2016  PCP: Binnie Rail, MD Patient coming from: home    Chief Complaint: syncope  HPI: Austin [name] is a 71 y.o. male with medical history significant of coronary artery disease status post 4 stents, essential hypertension, hyperlipidemia, and history of prior MI. Presents to the hospital after developing syncope. Patient reports he was sitting down and stood up and carried a heavy box and subsequently passed out. He denies any other symptoms reports that he had some chills today but denies any fevers. No witnessed seizure-like activity per my discussion with patient and wife at bedside.  ED Course: Patient was found to have serum creatinine 1.38 and mild thrombocytopenia as well as leukocytopenia. We were subsequently consulted for further evaluation recommendations given history of syncope.  Review of Systems: As per HPI otherwise 10 point review of systems negative.    Past Medical History:  Diagnosis Date  . Allergy   . CAP (community acquired pneumonia)   . Coronary artery disease    03/13/2016 DES to first OM, EF 55%  . Heart murmur    "outgrew it" (03/13/2016)  . Hematuria   . HTN (hypertension)   . Hx of cardiovascular stress test    ETT-Myoview (10/15): Nondiagnostic EKG changes, anterolateral and inferolateral scar, no ischemia, EF 48%, low risk  . Hyperlipemia   . Hyperlipidemia    NMR Lipoprofile 2008: LDL 168 ( 2410/ 1826), HDL 41, TG 71. LDL goal = <100, ideally <  70. Framingham Study LDL goal = < 130.  Marland Kitchen Kidney stones    "passed them"; Dr. Terance Hart (03/13/2016)  . Myocardial infarction 2001  . Non Hodgkin's lymphoma ([place])    [facility]  . Pleural effusion   . TIA (transient ischemic attack) 1980s   "I was exercising when I had it"    Past Surgical History:  Procedure Laterality Date  . CARDIAC CATHETERIZATION N/A 12/22/2015   Procedure: Left Heart Cath and  Coronary Angiography;  Surgeon: Sherren Mocha, MD;  Location: [place] [facility];  Service: Cardiovascular;  Laterality: N/A;  . CARDIAC CATHETERIZATION N/A 12/22/2015   Procedure: Coronary Stent Intervention;  Surgeon: Sherren Mocha, MD;  Location: [place] [facility];  Service: Cardiovascular;  Laterality: N/A;  . CARDIAC CATHETERIZATION N/A 12/22/2015   Procedure: Intravascular Pressure Wire/FFR Study;  Surgeon: Sherren Mocha, MD;  Location: [place] [facility];  Service: Cardiovascular;  Laterality: N/A;  . CARDIAC CATHETERIZATION N/A 03/13/2016   Procedure: Left Heart Cath and Coronary Angiography;  Surgeon: Sherren Mocha, MD;  Location: [place] [facility];  Service: Cardiovascular;  Laterality: N/A;  . COLONOSCOPY  2002  . CORONARY ANGIOPLASTY WITH STENT PLACEMENT  ~ 2013   "1 stent"  . ESOPHAGOGASTRODUODENOSCOPY (EGD) WITH ESOPHAGEAL DILATION  2002  . KNEE ARTHROSCOPY Left 1990s     reports that he has never smoked. He has never used smokeless tobacco. He reports that he drinks alcohol. He reports that he does not use drugs.  Allergies  Allergen Reactions  . Zetia [Ezetimibe] Shortness Of Breath and Other (See Comments)    Chest pain  . Crestor [Rosuvastatin] Other (See Comments)    Myalgias  . Pravachol [Pravastatin Sodium] Other (See Comments)    Myalgias  . Isosorbide Mononitrate [Isosorbide Nitrate] Other (See Comments)    Headache, throat soreness and hoarse  . Silver Rash    Family History  Problem Relation Age of Onset  .  Cancer Mother     lung cancer  . Diabetes Mother   . Hyperlipidemia Mother   . Stroke Father     age 32  . Cancer Sister     ?lymphoma     Prior to Admission medications   Medication Sig Start Date End Date Taking? Authorizing Provider  amLODipine (NORVASC) 5 MG tablet TAKE 1 TABLET ONE TIME DAILY 09/15/16   Sherren Mocha, MD  aspirin 81 MG tablet Take 81 mg by mouth daily.    Historical Provider, MD  baclofen (LIORESAL) 20 MG tablet Take 1  tablet (20 mg total) by mouth 3 (three) times daily. 09/08/16   Binnie Rail, MD  clopidogrel (PLAVIX) 75 MG tablet TAKE 1 TABLET(75 MG) BY MOUTH DAILY 10/09/16   Sherren Mocha, MD  dorzolamide-timolol (COSOPT) 22.3-6.8 MG/ML ophthalmic solution Place 1 drop into both eyes 2 (two) times daily. 12/15/15   Historical Provider, MD  doxazosin (CARDURA) 2 MG tablet TAKE 1 TABLET (2 MG TOTAL) BY MOUTH DAILY. 09/15/16   Sherren Mocha, MD  metoCLOPramide (REGLAN) 10 MG tablet Take 1 tablet (10 mg total) by mouth every 8 (eight) hours as needed for nausea. 09/06/16   Ripley Fraise, MD  metoprolol tartrate (LOPRESSOR) 25 MG tablet TAKE 1/2 TABLET TWICE DAILY 09/15/16   Sherren Mocha, MD  nitroGLYCERIN (NITROSTAT) 0.4 MG SL tablet PLACE AND DISSOLVE 1 TABLET UNDER THE TONGUE EVERY 5 MINUTES AS NEEDED FOR CHEST PAIN 03/24/16   Sherren Mocha, MD  timolol (BETIMOL) 0.25 % ophthalmic solution Place 1 drop into both eyes 2 (two) times daily.     Historical Provider, MD    Physical Exam: Vitals:   11/11/16 1600 11/11/16 1700 11/11/16 1730 11/11/16 1800  BP: 121/63 131/83 (!) 150/73 (!) 144/76  Pulse: 66 87 86 85  Resp: (!) 21 (!) 24 20 (!) 23  Temp:      TempSrc:      SpO2: 100% 100% 99% 99%    Constitutional: NAD, calm, comfortable Vitals:   11/11/16 1600 11/11/16 1700 11/11/16 1730 11/11/16 1800  BP: 121/63 131/83 (!) 150/73 (!) 144/76  Pulse: 66 87 86 85  Resp: (!) 21 (!) 24 20 (!) 23  Temp:      TempSrc:      SpO2: 100% 100% 99% 99%   Eyes: PERRL, lids and conjunctivae normal ENMT: Mucous membranes are moist. Posterior pharynx clear of any exudate or lesions. Neck: normal, supple, no masses, no thyromegaly Respiratory: clear to auscultation bilaterally, no wheezing, no crackles. Normal respiratory effort. No accessory muscle use.  Cardiovascular: Regular rate and rhythm, no rubs / gallops. No extremity edema. 2+ pedal pulses. No carotid bruits.  Abdomen: no tenderness, no masses palpated. No  hepatosplenomegaly. Bowel sounds positive.  Musculoskeletal: no clubbing / cyanosis. No joint deformity upper and lower extremities. Good ROM, no contractures. Normal muscle tone.  Skin: no rashes, lesions, ulcers. No induration Neurologic:  Sensation intact,  Strength 5/5 in all 4.  Psychiatric: Normal judgment and insight. Alert and oriented x 3. Normal mood.     Labs on Admission: I have personally reviewed following labs and imaging studies  CBC:  Recent Labs Lab 11/11/16 1606  WBC 3.3*  NEUTROABS 2.1  HGB 10.8*  HCT 31.8*  MCV 87.8  PLT 283*   Basic Metabolic Panel:  Recent Labs Lab 11/11/16 1606  NA 137  K 3.6  CL 101  CO2 27  GLUCOSE 151*  BUN 13  CREATININE 1.38*  CALCIUM 9.2  GFR: Estimated Creatinine Clearance: 55.5 mL/min (A) (by C-G formula based on SCr of 1.38 mg/dL (H)). Liver Function Tests:  Recent Labs Lab 11/11/16 1606  AST 20  ALT 14*  ALKPHOS 69  BILITOT 0.9  PROT 6.5  ALBUMIN 3.6   No results for input(s): LIPASE, AMYLASE in the last 168 hours. No results for input(s): AMMONIA in the last 168 hours. Coagulation Profile: No results for input(s): INR, PROTIME in the last 168 hours. Cardiac Enzymes: No results for input(s): CKTOTAL, CKMB, CKMBINDEX, TROPONINI in the last 168 hours. BNP (last 3 results) No results for input(s): PROBNP in the last 8760 hours. HbA1C: No results for input(s): HGBA1C in the last 72 hours. CBG: No results for input(s): GLUCAP in the last 168 hours. Lipid Profile: No results for input(s): CHOL, HDL, LDLCALC, TRIG, CHOLHDL, LDLDIRECT in the last 72 hours. Thyroid Function Tests: No results for input(s): TSH, T4TOTAL, FREET4, T3FREE, THYROIDAB in the last 72 hours. Anemia Panel: No results for input(s): VITAMINB12, FOLATE, FERRITIN, TIBC, IRON, RETICCTPCT in the last 72 hours. Urine analysis:    Component Value Date/Time   COLORURINE YELLOW 11/11/2016 1737   APPEARANCEUR CLEAR 11/11/2016 1737    LABSPEC 1.010 11/11/2016 1737   PHURINE 5.0 11/11/2016 1737   GLUCOSEU NEGATIVE 11/11/2016 1737   HGBUR SMALL (A) 11/11/2016 1737   HGBUR large 12/15/2009 1149   BILIRUBINUR NEGATIVE 11/11/2016 1737   BILIRUBINUR Neg 05/10/2012 1706   KETONESUR NEGATIVE 11/11/2016 1737   PROTEINUR NEGATIVE 11/11/2016 1737   UROBILINOGEN 0.2 05/10/2012 1706   UROBILINOGEN 0.2 12/15/2009 1149   NITRITE NEGATIVE 11/11/2016 1737   LEUKOCYTESUR NEGATIVE 11/11/2016 1737    Radiological Exams on Admission: Dg Chest 2 View  Result Date: 11/11/2016 CLINICAL DATA:  Syncopal episode.  Hypotensive EXAM: CHEST  2 VIEW COMPARISON:  09/21/2016 FINDINGS: Normal cardiac silhouette. Chronic elevation of the RIGHT hemidiaphragm. No effusion, infiltrate or pneumothorax. IMPRESSION: Chronic elevation RIGHT hemidiaphragm.  No acute findings. Electronically Signed   By: Suzy Bouchard M.D.   On: 11/11/2016 16:53    EKG: Independently reviewed.Sinus rhythm No ST elevations or depressions  Assessment/Plan Syncope and collapse - Most likely secondary to orthostatic hypotension. We'll discontinue doxazosin. Monitor on telemetry, obtain troponins, obtain vascular evaluation of carotids - History is most consistent with orthostatic hypotension. We'll obtain orthostatic vital signs  Active Problems:   Anemia - Stable no active bleeding   Essential hypertension - Hold amlodipine and doxazosin    Hyperlipidemia - Stable no chest pain reported   Allergic rhinitis - Symptoms stable   Coronary artery disease involving native coronary artery with angina pectoris (HCC) - Continue Plavix patient has history of 4 stents, continue beta blocker and aspirin      DVT prophylaxis: Heparin Code Status: Full Family Communication: Discussed with patient and spouse Disposition Plan: Pending improvement in condition Consults called: None Admission status: Observation   Velvet Bathe MD [facility] Pager [phone]  If 7PM-7AM, please contact night-coverage [email or web address] Password [facility]  11/11/2016, 6:21 PM

## 2016-11-11 NOTE — Progress Notes (Signed)
Received report from [place]

## 2016-11-11 NOTE — Progress Notes (Signed)
KESTER STIMPSON [id number] Admission Data: 11/11/2016 7:48 PM Attending Provider: Velvet Bathe, MD  VUY:EBXID J Burns, MD Consults/ Treatment Team:   RUDOLFO BRANDOW is a 71 y.o. male patient admitted from ED awake, alert  & orientated  X 3,  Full Code, VSS - Blood pressure 139/77, pulse 90, temperature 99.5 F (37.5 C), temperature source Oral, resp. rate 18, SpO2 100 %.,  Tele # 25 placed and pt is currently running:normal sinus rhythm.   IV site WDL:  antecubital left, condition patent and no redness with a transparent dsg that's clean dry and intact.  Allergies:   Allergies  Allergen Reactions  . Zetia [Ezetimibe] Shortness Of Breath and Other (See Comments)    Chest pain  . Crestor [Rosuvastatin] Other (See Comments)    Myalgias  . Pravachol [Pravastatin Sodium] Other (See Comments)    Myalgias  . Isosorbide Mononitrate [Isosorbide Nitrate] Other (See Comments)    Headache, throat soreness and hoarse  . Silver Rash     Past Medical History:  Diagnosis Date  . Allergy   . CAP (community acquired pneumonia)   . Coronary artery disease    03/13/2016 DES to first OM, EF 55%  . Heart murmur    "outgrew it" (03/13/2016)  . Hematuria   . HTN (hypertension)   . Hx of cardiovascular stress test    ETT-Myoview (10/15): Nondiagnostic EKG changes, anterolateral and inferolateral scar, no ischemia, EF 48%, low risk  . Hyperlipemia   . Hyperlipidemia    NMR Lipoprofile 2008: LDL 168 ( 2410/ 1826), HDL 41, TG 71. LDL goal = <100, ideally <  70. Framingham Study LDL goal = < 130.  Marland Kitchen Kidney stones    "passed them"; Dr. Terance Hart (03/13/2016)  . Myocardial infarction 2001  . Non Hodgkin's lymphoma ([place])    [facility]  . Pleural effusion   . TIA (transient ischemic attack) 1980s   "I was exercising when I had it"    Pt orientation to unit, room and routine. Information packet given to patient/family and safety video watched.  Admission INP armband ID verified with  patient/family, and in place. SR up x 2, fall risk assessment complete with Patient and family verbalizing understanding of risks associated with falls. Pt verbalizes an understanding of how to use the call bell and to call for help before getting out of bed.  Skin, clean-dry- intact without evidence of bruising, or skin tears.   No evidence of skin break down noted on exam. no rashes, no ecchymoses, no petechiae    Will cont to monitor and assist as needed.  Serita Degroote Margaretha Sheffield, RN 11/11/2016 7:48 PM

## 2016-11-11 NOTE — ED Triage Notes (Addendum)
Per EMS: pt comes from home, was helping family move a heavy object into the house.  Once pt entered the house he started to feel weak. Pt was assisted to the floor by his son. Pt was unconscious for 2 mins. Had pos ortho statics. Sitting 112/60 and 86/50 standing, pt got weak when standing.  Hx of Stents VSS pt given 500cc NS

## 2016-11-11 NOTE — ED Provider Notes (Signed)
Austin [name] DEPT Provider Note   CSN: [id number] Arrival date & time: 11/11/16  1547     History   Chief Complaint Chief Complaint  Patient presents with  . Loss of Consciousness    HPI [name] is a 71 y.o. male.  HPI Austin [name] is a 71 y.o. male with hx of CAD, HTN, hyperlipidemia, hx of hodgkin's' lymphoma, TIA, presents to ED with complaint of a syncopal episode. Pt states he was taking a heavy box out of his care and carrying to the house, when he had lost consciousness after taking just 4 steps. Pt does not remember the episode and states he does not recall feeling dizzy or light headed. His wife said he was unconscious for about a minute. When regained consciousness, pt was alert, oriented, but did not know what happened. Pt denies any complaints at this time. Per EMS, orthostatic at the scene, was given 560mL bolus of saline. Pt states he has been eating and drinking well. Denies headache, neck pain, back pain, chest pain, abdominal pain. No dizziness at this time. States he has had dry cough over last 3 days. No other complaints.   Past Medical History:  Diagnosis Date  . Allergy   . CAP (community acquired pneumonia)   . Coronary artery disease    03/13/2016 DES to first OM, EF 55%  . Heart murmur    "outgrew it" (03/13/2016)  . Hematuria   . HTN (hypertension)   . Hx of cardiovascular stress test    ETT-Myoview (10/15): Nondiagnostic EKG changes, anterolateral and inferolateral scar, no ischemia, EF 48%, low risk  . Hyperlipemia   . Hyperlipidemia    NMR Lipoprofile 2008: LDL 168 ( 2410/ 1826), HDL 41, TG 71. LDL goal = <100, ideally <  70. Framingham Study LDL goal = < 130.  Marland Kitchen Kidney stones    "passed them"; Dr. Terance Hart (03/13/2016)  . Myocardial infarction 2001  . Non Hodgkin's lymphoma ([place])    [facility]  . Pleural effusion   . TIA (transient ischemic attack) 1980s   "I was exercising when I had it"    Patient Active Problem List    Diagnosis Date Noted  . Intractable hiccups 09/08/2016  . CAP (community acquired pneumonia) 08/28/2016  . AKI (acute kidney injury) ([place]) 08/28/2016  . Hyperglycemia 08/28/2016  . Pneumonia 08/28/2016  . Pleural effusion 08/28/2016  . Ischemic chest pain ([place]) 03/13/2016  . Coronary artery disease involving native coronary artery with angina pectoris ([place]) 12/13/2010  . Hyperlipidemia 11/12/2010  . ALLERGIC RHINITIS 11/12/2010  . Anemia 12/28/2009  . THROMBOCYTOPENIA 12/28/2009  . 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 Hematuria 06/22/2009  . Essential hypertension 11/26/2008  . NEPHROLITHIASIS, HX OF 06/26/2007  . 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 CARDIAC CATHETERIZATION N/A 12/22/2015   Procedure: Left Heart Cath and Coronary Angiography;  Surgeon: Sherren Mocha, MD;  Location: [place] [facility];  Service: Cardiovascular;  Laterality: N/A;  . CARDIAC CATHETERIZATION N/A 12/22/2015   Procedure: Coronary Stent Intervention;  Surgeon: Sherren Mocha, MD;  Location: [place] [facility];  Service: Cardiovascular;  Laterality: N/A;  . CARDIAC CATHETERIZATION N/A 12/22/2015   Procedure: Intravascular Pressure Wire/FFR Study;  Surgeon: Sherren Mocha, MD;  Location: [place] [facility];  Service: Cardiovascular;  Laterality: N/A;  . CARDIAC CATHETERIZATION N/A 03/13/2016   Procedure: Left Heart Cath and Coronary Angiography;  Surgeon: Legrand Como  Burt Knack, MD;  Location: [place] [facility];  Service: Cardiovascular;  Laterality: N/A;  . COLONOSCOPY  2002  . CORONARY ANGIOPLASTY WITH STENT PLACEMENT  ~ 2013   "1 stent"  . ESOPHAGOGASTRODUODENOSCOPY (EGD) WITH ESOPHAGEAL DILATION  2002  . KNEE ARTHROSCOPY Left 1990s       Home Medications    Prior to Admission medications   Medication Sig Start Date End Date Taking? Authorizing Provider  amLODipine (NORVASC) 5 MG tablet TAKE 1 TABLET ONE TIME DAILY 09/15/16    Sherren Mocha, MD  aspirin 81 MG tablet Take 81 mg by mouth daily.    Historical Provider, MD  baclofen (LIORESAL) 20 MG tablet Take 1 tablet (20 mg total) by mouth 3 (three) times daily. 09/08/16   Binnie Rail, MD  clopidogrel (PLAVIX) 75 MG tablet TAKE 1 TABLET(75 MG) BY MOUTH DAILY 10/09/16   Sherren Mocha, MD  dorzolamide-timolol (COSOPT) 22.3-6.8 MG/ML ophthalmic solution Place 1 drop into both eyes 2 (two) times daily. 12/15/15   Historical Provider, MD  doxazosin (CARDURA) 2 MG tablet TAKE 1 TABLET (2 MG TOTAL) BY MOUTH DAILY. 09/15/16   Sherren Mocha, MD  metoCLOPramide (REGLAN) 10 MG tablet Take 1 tablet (10 mg total) by mouth every 8 (eight) hours as needed for nausea. 09/06/16   Ripley Fraise, MD  metoprolol tartrate (LOPRESSOR) 25 MG tablet TAKE 1/2 TABLET TWICE DAILY 09/15/16   Sherren Mocha, MD  nitroGLYCERIN (NITROSTAT) 0.4 MG SL tablet PLACE AND DISSOLVE 1 TABLET UNDER THE TONGUE EVERY 5 MINUTES AS NEEDED FOR CHEST PAIN 03/24/16   Sherren Mocha, MD  timolol (BETIMOL) 0.25 % ophthalmic solution Place 1 drop into both eyes 2 (two) times daily.     Historical Provider, MD    Family History Family History  Problem Relation Age of Onset  . Cancer Mother     lung cancer  . Diabetes Mother   . Hyperlipidemia Mother   . Stroke Father     age 23  . Cancer Sister     ?lymphoma    Social History Social History  Substance Use Topics  . Smoking status: Never Smoker  . Smokeless tobacco: Never Used  . Alcohol use Yes     Comment: drank some in my 20's"     Allergies   Zetia [ezetimibe]; Crestor [rosuvastatin]; Pravachol [pravastatin sodium]; Isosorbide mononitrate [isosorbide nitrate]; and Silver   Review of Systems Review of Systems  Constitutional: Negative for chills and fever.  Respiratory: Positive for cough. Negative for chest tightness and shortness of breath.   Cardiovascular: Negative for chest pain, palpitations and leg swelling.  Gastrointestinal: Negative  for abdominal distention, abdominal pain, diarrhea, nausea and vomiting.  Genitourinary: Negative for dysuria, frequency, hematuria and urgency.  Musculoskeletal: Negative for arthralgias, myalgias, neck pain and neck stiffness.  Skin: Negative for rash.  Allergic/Immunologic: Negative for immunocompromised state.  Neurological: Positive for syncope. Negative for dizziness, weakness, light-headedness, numbness and headaches.  All other systems reviewed and are negative.    Physical Exam Updated Vital Signs BP 136/67   Pulse 74   Temp 98.3 F (36.8 C) (Oral)   Resp 17   SpO2 100%   Physical Exam  Constitutional: He is oriented to person, place, and time. He appears well-developed and well-nourished. No distress.  HENT:  Head: Normocephalic and atraumatic.  Eyes: Conjunctivae and EOM are normal. Pupils are equal, round, and reactive to light.  Neck: Normal range of motion. Neck supple.  No midline tenderness.   Cardiovascular: Normal  rate, regular rhythm and normal heart sounds.   Pulmonary/Chest: Effort normal. No respiratory distress. He has no wheezes. He has no rales.  Abdominal: Soft. Bowel sounds are normal. He exhibits no distension. There is no tenderness. There is no rebound.  Musculoskeletal: He exhibits no edema.  Neurological: He is alert and oriented to person, place, and time.  Skin: Skin is warm and dry.  Nursing note and vitals reviewed.    ED Treatments / Results  Labs (all labs ordered are listed, but only abnormal results are displayed) Labs Reviewed  CBC WITH DIFFERENTIAL/PLATELET - Abnormal; Notable for the following:       Result Value   WBC 3.3 (*)    RBC 3.62 (*)    Hemoglobin 10.8 (*)    HCT 31.8 (*)    Platelets 149 (*)    All other components within normal limits  COMPREHENSIVE METABOLIC PANEL - Abnormal; Notable for the following:    Glucose, Bld 151 (*)    Creatinine, Ser 1.38 (*)    ALT 14 (*)    GFR calc non Af Amer 50 (*)    GFR calc  Af Amer 58 (*)    All other components within normal limits  URINALYSIS, ROUTINE W REFLEX MICROSCOPIC - Abnormal; Notable for the following:    Hgb urine dipstick SMALL (*)    Squamous Epithelial / LPF 0-5 (*)    All other components within normal limits  CBC - Abnormal; Notable for the following:    WBC 3.5 (*)    RBC 3.91 (*)    Hemoglobin 11.9 (*)    HCT 34.2 (*)    All other components within normal limits  CREATININE, SERUM  TROPONIN I  PHOSPHORUS  MAGNESIUM  BASIC METABOLIC PANEL  CBC  TROPONIN I  TROPONIN I  I-STAT TROPOININ, ED    EKG  EKG Interpretation  Date/Time:  Saturday November 11 2016 15:51:03 EDT Ventricular Rate:  76 PR Interval:    QRS Duration: 100 QT Interval:  387 QTC Calculation: 436 R Axis:   20 Text Interpretation:  Sinus rhythm Ventricular premature complex Abnormal R-wave progression, early transition Inferior infarct, old Abnormal lateral Q waves no brugada, prolonged qt, or wpw Confirmed by FLOYD MD, DANIEL [phone]) on 11/11/2016 4:38:32 PM       Radiology Dg Chest 2 View  Result Date: 11/11/2016 CLINICAL DATA:  Syncopal episode.  Hypotensive EXAM: CHEST  2 VIEW COMPARISON:  09/21/2016 FINDINGS: Normal cardiac silhouette. Chronic elevation of the RIGHT hemidiaphragm. No effusion, infiltrate or pneumothorax. IMPRESSION: Chronic elevation RIGHT hemidiaphragm.  No acute findings. Electronically Signed   By: Suzy Bouchard M.D.   On: 11/11/2016 16:53    Procedures Procedures (including critical care time)  Medications Ordered in ED Medications - No data to display   Initial Impression / Assessment and Plan / ED Course  I have reviewed the triage vital signs and the nursing notes.  Pertinent labs & imaging results that were available during my care of the patient were reviewed by me and considered in my medical decision making (see chart for details).     Patient in emergency department after a syncopal episode. No complaints at this  time. Question vasovagal syncope versus arrhythmia versus orthostatic hypotension. We will check labs, EKG, chest x-ray, troponin. Will give some more IV fluids. Will monitor on the cardiac monitor.  Patient's workup under emergency department is unremarkable. Discussed with Dr. Tyrone Nine. Given no prodrome to his syncopal episode concerning for arrhythmia.  Will admit for observation.   Discussed with hospitalist who will admit. VS normal.   Vitals:   11/11/16 2222     BP: (!) 158/86     Pulse: 95     Resp: 18     Temp: 99.5 F (37.5 C)     TempSrc: Oral     SpO2: 99%        Final Clinical Impressions(s) / ED Diagnoses   Final diagnoses:  Syncope, unspecified syncope type    New Prescriptions Current Discharge Medication List       Jeannett Senior, PA-C 11/11/16 [place], DO 11/12/16 2323

## 2016-11-12 ENCOUNTER — Observation Stay (HOSPITAL_BASED_OUTPATIENT_CLINIC_OR_DEPARTMENT_OTHER): Payer: Medicare HMO

## 2016-11-12 ENCOUNTER — Observation Stay (HOSPITAL_COMMUNITY): Payer: Medicare HMO

## 2016-11-12 DIAGNOSIS — R55 Syncope and collapse: Secondary | ICD-10-CM | POA: Diagnosis not present

## 2016-11-12 DIAGNOSIS — I25119 Atherosclerotic heart disease of native coronary artery with unspecified angina pectoris: Secondary | ICD-10-CM | POA: Diagnosis not present

## 2016-11-12 DIAGNOSIS — J9811 Atelectasis: Secondary | ICD-10-CM | POA: Diagnosis not present

## 2016-11-12 LAB — CBC
HEMATOCRIT: 32.9 % — AB (ref 39.0–52.0)
HEMOGLOBIN: 11.2 g/dL — AB (ref 13.0–17.0)
MCH: 29.8 pg (ref 26.0–34.0)
MCHC: 34 g/dL (ref 30.0–36.0)
MCV: 87.5 fL (ref 78.0–100.0)
Platelets: 149 10*3/uL — ABNORMAL LOW (ref 150–400)
RBC: 3.76 MIL/uL — ABNORMAL LOW (ref 4.22–5.81)
RDW: 14.1 % (ref 11.5–15.5)
WBC: 3.5 10*3/uL — ABNORMAL LOW (ref 4.0–10.5)

## 2016-11-12 LAB — BASIC METABOLIC PANEL
ANION GAP: 8 (ref 5–15)
BUN: 10 mg/dL (ref 6–20)
CO2: 27 mmol/L (ref 22–32)
Calcium: 9.1 mg/dL (ref 8.9–10.3)
Chloride: 104 mmol/L (ref 101–111)
Creatinine, Ser: 1.08 mg/dL (ref 0.61–1.24)
GFR calc Af Amer: >60 mL/min (ref >60)
GLUCOSE: 102 mg/dL — AB (ref 65–99)
POTASSIUM: 3.5 mmol/L (ref 3.5–5.1)
Sodium: 139 mmol/L (ref 135–145)

## 2016-11-12 LAB — TROPONIN I

## 2016-11-12 LAB — MAGNESIUM: Magnesium: 2 mg/dL (ref 1.7–2.4)

## 2016-11-12 LAB — PHOSPHORUS: Phosphorus: 3.2 mg/dL (ref 2.5–4.6)

## 2016-11-12 MED ORDER — BRINZOLAMIDE 1 % OP SUSP
1.0000 [drp] | Freq: Two times a day (BID) | OPHTHALMIC | Status: DC
  Administered 2016-11-13 – 2016-11-14 (×3): 1 [drp] via OPHTHALMIC
  Filled 2016-11-12 (×2): qty 10

## 2016-11-12 MED ORDER — TIMOLOL MALEATE 0.5 % OP SOLN
1.0000 [drp] | Freq: Two times a day (BID) | OPHTHALMIC | Status: DC
  Administered 2016-11-13: 1 [drp] via OPHTHALMIC
  Filled 2016-11-12 (×2): qty 5

## 2016-11-12 MED ORDER — FUROSEMIDE 10 MG/ML IJ SOLN
INTRAMUSCULAR | Status: AC
  Filled 2016-11-12: qty 2

## 2016-11-12 NOTE — Care Management Note (Signed)
Case Management Note  Patient Details  Name: Austin [name] MRN: [id number] Date of Birth: 01/29/1946  Subjective/Objective:    Pt admitted with CAD                Action/Plan:   PTA independent from home with wife.  Pt has active relationship with PCP and denied barriers to obtaining medications.  CM will continue to follow for discharge needs   Expected Discharge Date:                  Expected Discharge Plan:  Home/Self Care  In-House Referral:     Discharge planning Services  CM Consult  Post Acute Care Choice:    Choice offered to:     DME Arranged:    DME Agency:     HH Arranged:    HH Agency:     Status of Service:  In process, will continue to follow  If discussed at Long Length of Stay Meetings, dates discussed:    Additional Comments:  Maryclare Labrador, RN 11/12/2016, 10:26 AM

## 2016-11-12 NOTE — Progress Notes (Signed)
*  PRELIMINARY RESULTS* Vascular Ultrasound Carotid Duplex (Doppler) has been completed.   Findings suggest 1-39% internal carotid artery stenosis bilaterally. Vertebral arteries are patent with antegrade flow.  11/12/2016 3:24 PM Maudry Mayhew, BS, RVT, RDCS, RDMS

## 2016-11-12 NOTE — Evaluation (Signed)
Physical Therapy Evaluation Patient Details Name: Austin [name] MRN: [id number] DOB: Apr 23, 1946 Today's Date: 11/12/2016   History of Present Illness  Patient is a 71 y/o male with hx of MI, HTN, HLD, non Hodgkins lymphoma, CAD, CAP, TIA presents with syncope most likely due to orthostatic hypotension.  Clinical Impression  Patient presents with orthostasis when going from supine to sitting position but asymptomatic. Tolerated gait training and standing activities without LOB or difficulty. Pt reports no dizziness throughout session. Orthostatic vitals are below: Supine BP 128/69  71 bpm Sitting BP 108/71  83 bpm Standing BP 117/70  86 bpm Sitting BP 140/73 post ambulation Pt is independent PTA and eager to return home. 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 Encouraged daily mobility with nursing to maintain strength/mobility while in hospital. Pt does not require skilled therapy services as pt functioning at baseline. Education re: waiting prior to mobility when changing position, staying hydrated etc. Discharge from therapy.    Follow Up Recommendations No PT follow up;Supervision - Intermittent    Equipment Recommendations  None recommended by PT    Recommendations for Other Services       Precautions / Restrictions Precautions Precautions: Fall Precaution Comments: secondary to orthostatic hypotension Restrictions Weight Bearing Restrictions: No      Mobility  Bed Mobility Overal bed mobility: Modified Independent             General bed mobility comments: No assist needed.   Transfers Overall transfer level: Modified independent Equipment used: None             General transfer comment: Stood from EOB without difficulty. Transferred to chair post ambulation bout. No dizziness standing.  Ambulation/Gait Ambulation/Gait assistance: Modified independent (Device/Increase time);Supervision Ambulation Distance (Feet): 150  Feet Assistive device: None Gait Pattern/deviations: Step-through pattern     General Gait Details: Slow, steady gait with no evidence of imbalance. No dizziness.  Stairs            Wheelchair Mobility    Modified Rankin (Stroke Patients Only)       Balance Overall balance assessment: Needs assistance Sitting-balance support: Feet supported;No upper extremity supported Sitting balance-Leahy Scale: Good     Standing balance support: During functional activity Standing balance-Leahy Scale: Good Standing balance comment: Able to stand at sink and wash hands, brush teeth- reaching outside BoS without difficulty or LOB.                             Pertinent Vitals/Pain Pain Assessment: No/denies pain    Home Living Family/patient expects to be discharged to:: Private residence Living Arrangements: Spouse/significant other;Other relatives Available Help at Discharge: Family;Available PRN/intermittently Type of Home: House Home Access: Stairs to enter Entrance Stairs-Rails: Right Entrance Stairs-Number of Steps: 4 Home Layout: One level Home Equipment: None      Prior Function Level of Independence: Independent         Comments: Retired Engineer, structural; cooks, drives. Active.     Hand Dominance        Extremity/Trunk Assessment   Upper Extremity Assessment Upper Extremity Assessment: Defer to OT evaluation    Lower Extremity Assessment Lower Extremity Assessment: Overall WFL for tasks assessed    Cervical / Trunk Assessment Cervical / Trunk Assessment: Normal  Communication   Communication: No difficulties  Cognition Arousal/Alertness: Awake/alert Behavior During Therapy: WFL for tasks assessed/performed Overall Cognitive Status: Within Functional Limits for tasks  assessed                      General Comments General comments (skin integrity, edema, etc.): See orthostatic vitals in impression statement.    Exercises      Assessment/Plan    PT Assessment Patent does not need any further PT services  PT Problem List         PT Treatment Interventions      PT Goals (Current goals can be found in the Care Plan section)  Acute Rehab PT Goals Patient Stated Goal: to go home today PT Goal Formulation: All assessment and education complete, DC therapy    Frequency     Barriers to discharge        Co-evaluation               End of Session Equipment Utilized During Treatment: Gait belt Activity Tolerance: Patient tolerated treatment well Patient left: in chair;with call bell/phone within reach;with chair alarm set Nurse Communication: Mobility status PT Visit Diagnosis: Unsteadiness on feet (R26.81)    Functional Assessment Tool Used: Clinical judgement Functional Limitation: Mobility: Walking and moving around Mobility: Walking and Moving Around Current Status (B7628): At least 1 percent but less than 20 percent impaired, limited or restricted Mobility: Walking and Moving Around Goal Status [phone]): At least 1 percent but less than 20 percent impaired, limited or restricted Mobility: Walking and Moving Around Discharge Status [phone]): At least 1 percent but less than 20 percent impaired, limited or restricted    Time: 1014-1040 PT Time Calculation (min) (ACUTE ONLY): 26 min   Charges:   PT Evaluation $PT Eval Low Complexity: 1 Procedure PT Treatments $Gait Training: 8-22 mins   PT G Codes:   PT G-Codes **NOT FOR INPATIENT CLASS** Functional Assessment Tool Used: Clinical judgement Functional Limitation: Mobility: Walking and moving around Mobility: Walking and Moving Around Current Status (P7106): At least 1 percent but less than 20 percent impaired, limited or restricted Mobility: Walking and Moving Around Goal Status [phone]): At least 1 percent but less than 20 percent impaired, limited or restricted Mobility: Walking and Moving Around Discharge Status [phone]): At least 1  percent but less than 20 percent impaired, limited or restricted     [place] 11/12/2016, 10:49 AM  Wray Kearns, PT, DPT [phone]

## 2016-11-12 NOTE — Care Management Obs Status (Signed)
[facility] NOTIFICATION   Patient Details  Name: KHUSH PASION MRN: [id number] Date of Birth: 04-13-1946   Medicare Observation Status Notification Given:  Yes    Maryclare Labrador, RN 11/12/2016, 10:27 AM

## 2016-11-12 NOTE — Progress Notes (Addendum)
Patient ID: Austin [name], male   DOB: 1945-11-14, 71 y.o.   MRN: [id number]     PROGRESS NOTE  Austin [name]  SVX:[id number] DOB: Dec 16, 1945 DOA: 11/11/2016  PCP: Binnie Rail, MD   Brief Narrative:   71 y.o. male with known coronary artery disease status post 4 stents, essential hypertension, hyperlipidemia, and history of prior MI, presented to the hospital after developing syncope. Patient reported he was sitting down and stood up and carried a heavy box and subsequently passed out. He denies any other symptoms reports that he had some chills today but denies any fevers. No witnessed seizure-like activity per family.   Assessment & Plan:   Active Problems:   Syncopal event - unclear etiology but pt noted to be orthostatic on admission - IVF were provided and pt reports feeling better - ECHO done recently 08/2016 and with stable EF, grade 1 diastolic CHF - CE's x 3 negative  - overnight noted fevers and with pt's cough I noted this AM, rhonchi at the bases L > R, will ask for CT chest to rule out underlying PNA - hold off on ABX for now - repeat orthostatic vitals  - PT eval done, no further recommendations     Leukopenia - mild, possibly reactive - repeat CBC in AM    Thrombocytopenia - mild, will repeat CBC in AM    Essential hypertension - reasonably stable today     Coronary artery disease involving native coronary artery with angina pectoris (HCC) - no chest pain this AM   DVT prophylaxis: Heparin SQ Code Status: Full  Family Communication: Patient at bedside  Disposition Plan: Home in AM   Consultants:   None  Procedures:   None  Antimicrobials:   None   Subjective: No events overnight.   Objective: Vitals:   11/11/16 2307 11/11/16 2310 11/12/16 0540 11/12/16 1147  BP: 118/63 123/68 (!) 147/78 136/71  Pulse:  89 68 75  Resp: 16 18 20    Temp: (!) 100.9 F (38.3 C) (!) 100.7 F (38.2 C) 100.1 F (37.8 C)   TempSrc: Oral Oral Oral     SpO2:  100% 98%     Intake/Output Summary (Last 24 hours) at 11/12/16 1525 Last data filed at 11/12/16 0900  Gross per 24 hour  Intake              740 ml  Output              150 ml  Net              590 ml   There were no vitals filed for this visit.  Examination:  General exam: Appears calm and comfortable  Respiratory system: Respiratory effort normal. Rhonchi at bases L > R with noted somewhat dry cough  Cardiovascular system: S1 & S2 heard, RRR. No  rubs, gallops or clicks. No pedal edema. Gastrointestinal system: Abdomen is nondistended, soft and nontender. No organomegaly or masses felt.  Central nervous system: Alert and oriented. No focal neurological deficits. Extremities: Symmetric 5 x 5 power.  Data Reviewed: I have personally reviewed following labs and imaging studies  CBC:  Recent Labs Lab 11/11/16 1606 11/11/16 1904 11/12/16 0635  WBC 3.3* 3.5* 3.5*  NEUTROABS 2.1  --   --   HGB 10.8* 11.9* 11.2*  HCT 31.8* 34.2* 32.9*  MCV 87.8 87.5 87.5  PLT 149* 155 233*   Basic Metabolic Panel:  Recent Labs Lab 11/11/16 1606 11/11/16 1904  11/12/16 0635  NA 137  --  139  K 3.6  --  3.5  CL 101  --  104  CO2 27  --  27  GLUCOSE 151*  --  102*  BUN 13  --  10  CREATININE 1.38* 1.11 1.08  CALCIUM 9.2  --  9.1  MG  --   --  2.0  PHOS  --   --  3.2   Liver Function Tests:  Recent Labs Lab 11/11/16 1606  AST 20  ALT 14*  ALKPHOS 69  BILITOT 0.9  PROT 6.5  ALBUMIN 3.6   Cardiac Enzymes:  Recent Labs Lab 11/11/16 1904 11/12/16 0032 11/12/16 0635  TROPONINI <0.03 <0.03 <0.03   Urine analysis:    Component Value Date/Time   COLORURINE YELLOW 11/11/2016 1737   APPEARANCEUR CLEAR 11/11/2016 1737   LABSPEC 1.010 11/11/2016 1737   PHURINE 5.0 11/11/2016 1737   GLUCOSEU NEGATIVE 11/11/2016 1737   HGBUR SMALL (A) 11/11/2016 1737   HGBUR large 12/15/2009 1149   BILIRUBINUR NEGATIVE 11/11/2016 1737   BILIRUBINUR Neg 05/10/2012 1706    KETONESUR NEGATIVE 11/11/2016 1737   PROTEINUR NEGATIVE 11/11/2016 1737   UROBILINOGEN 0.2 05/10/2012 1706   UROBILINOGEN 0.2 12/15/2009 1149   NITRITE NEGATIVE 11/11/2016 1737   LEUKOCYTESUR NEGATIVE 11/11/2016 1737   Radiology Studies: Dg Chest 2 View  Result Date: 11/11/2016 CLINICAL DATA:  Syncopal episode.  Hypotensive EXAM: CHEST  2 VIEW COMPARISON:  09/21/2016 FINDINGS: Normal cardiac silhouette. Chronic elevation of the RIGHT hemidiaphragm. No effusion, infiltrate or pneumothorax. IMPRESSION: Chronic elevation RIGHT hemidiaphragm.  No acute findings. Electronically Signed   By: Suzy Bouchard M.D.   On: 11/11/2016 16:53    Scheduled Meds: . aspirin  81 mg Oral Daily  . baclofen  20 mg Oral TID  . clopidogrel  75 mg Oral Daily  . dorzolamide  1 drop Both Eyes BID  . dorzolamide-timolol  1 drop Both Eyes BID  . furosemide      . heparin  5,000 Units Subcutaneous Q8H  . metoprolol tartrate  12.5 mg Oral BID  . sodium chloride flush  3 mL Intravenous Q12H  . timolol  1 drop Both Eyes BID   Continuous Infusions:   LOS: 0 days   Time spent: 20 minutes   Faye Ramsay, MD [facility] Pager [phone]  If 7PM-7AM, please contact night-coverage [email or web address] Password [facility] 11/12/2016, 3:25 PM

## 2016-11-13 ENCOUNTER — Encounter (HOSPITAL_COMMUNITY): Payer: Self-pay | Admitting: *Deleted

## 2016-11-13 DIAGNOSIS — Z801 Family history of malignant neoplasm of trachea, bronchus and lung: Secondary | ICD-10-CM | POA: Diagnosis not present

## 2016-11-13 DIAGNOSIS — Z7982 Long term (current) use of aspirin: Secondary | ICD-10-CM | POA: Diagnosis not present

## 2016-11-13 DIAGNOSIS — Z7902 Long term (current) use of antithrombotics/antiplatelets: Secondary | ICD-10-CM | POA: Diagnosis not present

## 2016-11-13 DIAGNOSIS — Z823 Family history of stroke: Secondary | ICD-10-CM | POA: Diagnosis not present

## 2016-11-13 DIAGNOSIS — R55 Syncope and collapse: Secondary | ICD-10-CM | POA: Diagnosis present

## 2016-11-13 DIAGNOSIS — Z833 Family history of diabetes mellitus: Secondary | ICD-10-CM | POA: Diagnosis not present

## 2016-11-13 DIAGNOSIS — N400 Enlarged prostate without lower urinary tract symptoms: Secondary | ICD-10-CM | POA: Diagnosis present

## 2016-11-13 DIAGNOSIS — D696 Thrombocytopenia, unspecified: Secondary | ICD-10-CM | POA: Diagnosis present

## 2016-11-13 DIAGNOSIS — I10 Essential (primary) hypertension: Secondary | ICD-10-CM | POA: Diagnosis not present

## 2016-11-13 DIAGNOSIS — I951 Orthostatic hypotension: Secondary | ICD-10-CM | POA: Diagnosis present

## 2016-11-13 DIAGNOSIS — Z8572 Personal history of non-Hodgkin lymphomas: Secondary | ICD-10-CM | POA: Diagnosis not present

## 2016-11-13 DIAGNOSIS — J101 Influenza due to other identified influenza virus with other respiratory manifestations: Secondary | ICD-10-CM | POA: Diagnosis present

## 2016-11-13 DIAGNOSIS — Z888 Allergy status to other drugs, medicaments and biological substances status: Secondary | ICD-10-CM | POA: Diagnosis not present

## 2016-11-13 DIAGNOSIS — Z8571 Personal history of Hodgkin lymphoma: Secondary | ICD-10-CM | POA: Diagnosis not present

## 2016-11-13 DIAGNOSIS — Z8673 Personal history of transient ischemic attack (TIA), and cerebral infarction without residual deficits: Secondary | ICD-10-CM | POA: Diagnosis not present

## 2016-11-13 DIAGNOSIS — D649 Anemia, unspecified: Secondary | ICD-10-CM | POA: Diagnosis present

## 2016-11-13 DIAGNOSIS — R001 Bradycardia, unspecified: Secondary | ICD-10-CM | POA: Diagnosis present

## 2016-11-13 DIAGNOSIS — I110 Hypertensive heart disease with heart failure: Secondary | ICD-10-CM | POA: Diagnosis present

## 2016-11-13 DIAGNOSIS — D72819 Decreased white blood cell count, unspecified: Secondary | ICD-10-CM | POA: Diagnosis present

## 2016-11-13 DIAGNOSIS — I2510 Atherosclerotic heart disease of native coronary artery without angina pectoris: Secondary | ICD-10-CM | POA: Diagnosis present

## 2016-11-13 DIAGNOSIS — Z955 Presence of coronary angioplasty implant and graft: Secondary | ICD-10-CM | POA: Diagnosis not present

## 2016-11-13 DIAGNOSIS — I252 Old myocardial infarction: Secondary | ICD-10-CM | POA: Diagnosis not present

## 2016-11-13 DIAGNOSIS — E785 Hyperlipidemia, unspecified: Secondary | ICD-10-CM | POA: Diagnosis present

## 2016-11-13 DIAGNOSIS — I25119 Atherosclerotic heart disease of native coronary artery with unspecified angina pectoris: Secondary | ICD-10-CM | POA: Diagnosis not present

## 2016-11-13 DIAGNOSIS — I5032 Chronic diastolic (congestive) heart failure: Secondary | ICD-10-CM | POA: Diagnosis present

## 2016-11-13 LAB — BASIC METABOLIC PANEL
ANION GAP: 10 (ref 5–15)
BUN: 12 mg/dL (ref 6–20)
CO2: 28 mmol/L (ref 22–32)
Calcium: 9.3 mg/dL (ref 8.9–10.3)
Chloride: 100 mmol/L — ABNORMAL LOW (ref 101–111)
Creatinine, Ser: 1.24 mg/dL (ref 0.61–1.24)
GFR, EST NON AFRICAN AMERICAN: 57 mL/min — AB (ref >60)
GLUCOSE: 104 mg/dL — AB (ref 65–99)
POTASSIUM: 3.4 mmol/L — AB (ref 3.5–5.1)
Sodium: 138 mmol/L (ref 135–145)

## 2016-11-13 LAB — GLUCOSE, CAPILLARY: Glucose-Capillary: 138 mg/dL — ABNORMAL HIGH (ref 65–99)

## 2016-11-13 LAB — CORTISOL-AM, BLOOD: Cortisol - AM: 21.4 ug/dL (ref 6.7–22.6)

## 2016-11-13 LAB — VAS US CAROTID
LCCADDIAS: -19 cm/s
LEFT ECA DIAS: 14 cm/s
LEFT VERTEBRAL DIAS: 16 cm/s
LICADSYS: -37 cm/s
LICAPDIAS: -17 cm/s
Left CCA dist sys: -99 cm/s
Left CCA prox dias: 23 cm/s
Left CCA prox sys: 113 cm/s
Left ICA dist dias: -17 cm/s
Left ICA prox sys: -61 cm/s
RCCADSYS: -54 cm/s
RIGHT ECA DIAS: -12 cm/s
RIGHT VERTEBRAL DIAS: 16 cm/s
Right CCA prox dias: 11 cm/s
Right CCA prox sys: 89 cm/s

## 2016-11-13 LAB — CBC
HEMATOCRIT: 38.6 % — AB (ref 39.0–52.0)
Hemoglobin: 12.9 g/dL — ABNORMAL LOW (ref 13.0–17.0)
MCH: 29.5 pg (ref 26.0–34.0)
MCHC: 33.4 g/dL (ref 30.0–36.0)
MCV: 88.3 fL (ref 78.0–100.0)
PLATELETS: 160 10*3/uL (ref 150–400)
RBC: 4.37 MIL/uL (ref 4.22–5.81)
RDW: 13.8 % (ref 11.5–15.5)
WBC: 4.3 10*3/uL (ref 4.0–10.5)

## 2016-11-13 LAB — TROPONIN I

## 2016-11-13 LAB — TSH: TSH: 5.35 u[IU]/mL — AB (ref 0.350–4.500)

## 2016-11-13 LAB — INFLUENZA PANEL BY PCR (TYPE A & B)
Influenza A By PCR: NEGATIVE
Influenza B By PCR: POSITIVE — AB

## 2016-11-13 MED ORDER — OSELTAMIVIR PHOSPHATE 75 MG PO CAPS
75.0000 mg | ORAL_CAPSULE | Freq: Two times a day (BID) | ORAL | Status: DC
  Administered 2016-11-13 – 2016-11-14 (×3): 75 mg via ORAL
  Filled 2016-11-13 (×3): qty 1

## 2016-11-13 MED ORDER — SODIUM CHLORIDE 0.9 % IV BOLUS (SEPSIS)
500.0000 mL | Freq: Once | INTRAVENOUS | Status: AC
  Administered 2016-11-13: 500 mL via INTRAVENOUS

## 2016-11-13 NOTE — Progress Notes (Signed)
MD was informed about patient having his HR in mid 20s. Will continue to monitor.

## 2016-11-13 NOTE — Progress Notes (Signed)
   11/13/16 0500  What Happened  Was fall witnessed? No  Was patient injured? Unsure  Patient found on floor;in bathroom  Found by Staff-comment (primary RN after HR dropped on telemetry)  Stated prior activity to/from bed, chair, or stretcher  Follow Up  MD notified [place]  Time MD notified 0500  Family notified No- patient refusal  Additional tests Yes-comment (MD ordered EKG and bolus)  Progress note created (see row info) Yes  Adult Fall Risk Assessment  Risk Factor Category (scoring not indicated) History of more than one fall within 6 months before admission (document High fall risk);Fall has occurred during this admission (document High fall risk)  Patient's Fall Risk High Fall Risk (>13 points)  Adult Fall Risk Interventions  Required Bundle Interventions *See Row Information* High fall risk - low, moderate, and high requirements implemented  Additional Interventions Individualized elimination schedule;Use of appropriate toileting equipment (bedpan, BSC, etc.)  Screening for Fall Injury Risk  Risk For Fall Injury- See Row Information  F;Nurse judgement  Injury Prevention Interventions Floor Mat  Vitals  Temp 98.6 F (37 C)  Temp Source Oral  BP (!) 71/36  MAP (mmHg) (!) 45  BP Location Right Arm  BP Method Automatic  Patient Position (if appropriate) Sitting  Pulse Rate (!) 44  Pulse Rate Source Dinamap  Resp 18  Oxygen Therapy  SpO2 100 %  O2 Device Room Air  Pain Assessment  Pain Assessment No/denies pain  PCA/Epidural/Spinal Assessment  Respiratory Pattern Regular  Neurological  Neuro (WDL) X  Level of Consciousness Responds to Voice  Orientation Level Oriented to person;Disoriented to time;Disoriented to situation;Disoriented to place  Cognition Poor attention/concentration;Poor judgement;Poor safety awareness;Unable to follow commands  Speech Delayed responses  Pupil Assessment  Yes  R Pupil Size (mm) 2  R Pupil Shape Round  R Pupil Reaction Brisk  L  Pupil Size (mm) 2  L Pupil Shape Round  L Pupil Reaction Brisk  Additional Pupil Assessments No  Motor Function/Sensation Assessment Grip  Facial Symmetry Symmetrical  R Hand Grip Weak  L Hand Grip Weak   Neuro Additional Assessments No  Musculoskeletal  Musculoskeletal (WDL) X  Assistive Device BSC  Generalized Weakness Yes  Weight Bearing Restrictions No  Integumentary  Integumentary (WDL) WDL   Follow up VS taken and neurovascular checks completed and documented.  Patient is now alert and oriented times four.  Will continue to monitor patient.

## 2016-11-13 NOTE — Progress Notes (Signed)
Patient ID: Austin [name], male   DOB: 1945/12/17, 71 y.o.   MRN: [id number]     PROGRESS NOTE  Austin [name]  GEZ:[id number] DOB: 1945-11-29 DOA: 11/11/2016  PCP: Binnie Rail, MD   Brief Narrative:   71 y.o. male with known coronary artery disease status post 4 stents, essential hypertension, hyperlipidemia, and history of prior MI, presented to the hospital after developing syncope. Patient reported he was sitting down and stood up and carried a heavy box and subsequently passed out. He denies any other symptoms reports that he had some chills today but denies any fevers. No witnessed seizure-like activity per family.   Assessment & Plan:   Active Problems:   Syncopal event - unclear etiology but pt noted to be orthostatic on admission, beta blocker, Influenza B + - IVF were provided and pt reports feeling better but had another syncopal event this AM  - ECHO done recently 08/2016 and with stable EF, grade 1 diastolic CHF - CE's x 3 negative  - repeat orthostatic vitals today  - PT eval done, no further recommendations     Bradycardia - possibly contributing to syncopal events - stopped metoprolol, cardiology consulted - keep on tele     Fever, influenza B + - started Tamiflu today     Leukopenia - mild, possibly reactive - resolved  - repeat CBC in AM    Thrombocytopenia - mild, reactive - resolved  - CBC in AM    Essential hypertension - reasonably stable today     Coronary artery disease involving native coronary artery with angina pectoris (HCC) - no chest pain this AM   DVT prophylaxis: Heparin SQ Code Status: Full  Family Communication: Patient at bedside  Disposition Plan: Home in AM if no further syncopal events   Consultants:   None  Procedures:   None  Antimicrobials:   Tamiflu 3/19 -->  Subjective: Syncopal event this AM, fell in the bathroom and had urinary incontinence.   Objective: Vitals:   11/13/16 1127 11/13/16 1131  11/13/16 1133 11/13/16 1517  BP: (!) 118/54   (!) 106/56  Pulse: (!) 48   62  Resp: 18   19  Temp: 97.7 F (36.5 C)   98.3 F (36.8 C)  TempSrc: Oral   Oral  SpO2: 96% 100% 100% 98%    Intake/Output Summary (Last 24 hours) at 11/13/16 1734 Last data filed at 11/13/16 6503  Gross per 24 hour  Intake               60 ml  Output              350 ml  Net             -290 ml   There were no vitals filed for this visit.  Examination:  General exam: Appears calm and comfortable  Respiratory system: Respiratory effort normal. Rhonchi at bases L > R with noted somewhat dry cough  Cardiovascular system: S1 & S2 heard, bradycardia. No  rubs, gallops or clicks. No pedal edema. Gastrointestinal system: Abdomen is nondistended, soft and nontender. No organomegaly or masses felt.  Central nervous system: Alert and oriented. No focal neurological deficits. Extremities: Symmetric 5 x 5 power.  Data Reviewed: I have personally reviewed following labs and imaging studies  CBC:  Recent Labs Lab 11/11/16 1606 11/11/16 1904 11/12/16 0635 11/13/16 0758  WBC 3.3* 3.5* 3.5* 4.3  NEUTROABS 2.1  --   --   --  HGB 10.8* 11.9* 11.2* 12.9*  HCT 31.8* 34.2* 32.9* 38.6*  MCV 87.8 87.5 87.5 88.3  PLT 149* 155 149* 782   Basic Metabolic Panel:  Recent Labs Lab 11/11/16 1606 11/11/16 1904 11/12/16 0635 11/13/16 0758  NA 137  --  139 138  K 3.6  --  3.5 3.4*  CL 101  --  104 100*  CO2 27  --  27 28  GLUCOSE 151*  --  102* 104*  BUN 13  --  10 12  CREATININE 1.38* 1.11 1.08 1.24  CALCIUM 9.2  --  9.1 9.3  MG  --   --  2.0  --   PHOS  --   --  3.2  --    Liver Function Tests:  Recent Labs Lab 11/11/16 1606  AST 20  ALT 14*  ALKPHOS 69  BILITOT 0.9  PROT 6.5  ALBUMIN 3.6   Cardiac Enzymes:  Recent Labs Lab 11/11/16 1904 11/12/16 0032 11/12/16 0635 11/13/16 0758  TROPONINI <0.03 <0.03 <0.03 <0.03   Urine analysis:    Component Value Date/Time   COLORURINE YELLOW  11/11/2016 1737   APPEARANCEUR CLEAR 11/11/2016 1737   LABSPEC 1.010 11/11/2016 1737   PHURINE 5.0 11/11/2016 1737   GLUCOSEU NEGATIVE 11/11/2016 1737   HGBUR SMALL (A) 11/11/2016 1737   HGBUR large 12/15/2009 1149   BILIRUBINUR NEGATIVE 11/11/2016 1737   BILIRUBINUR Neg 05/10/2012 1706   KETONESUR NEGATIVE 11/11/2016 1737   PROTEINUR NEGATIVE 11/11/2016 1737   UROBILINOGEN 0.2 05/10/2012 1706   UROBILINOGEN 0.2 12/15/2009 1149   NITRITE NEGATIVE 11/11/2016 1737   LEUKOCYTESUR NEGATIVE 11/11/2016 1737   Radiology Studies: Ct Chest Wo Contrast  Result Date: 11/12/2016 CLINICAL DATA:  Fever and cough. EXAM: CT CHEST WITHOUT CONTRAST TECHNIQUE: Multidetector CT imaging of the chest was performed following the standard protocol without IV contrast. COMPARISON:  Chest radiograph 11/11/2016 FINDINGS: Cardiovascular: Calcific atherosclerotic disease of the coronary arteries and aorta. Coronary stents in place. Small pericardial effusion, likely trivial. Normal heart size. Mediastinum/Nodes: No enlarged mediastinal or axillary lymph nodes. Thyroid gland, trachea, and esophagus demonstrate no significant findings. Lungs/Pleura: Lungs are clear. No pleural effusion or pneumothorax. Minimal right basilar atelectasis with elevation of the right hemidiaphragm. Upper Abdomen: No acute abnormality. 14 mm hypoattenuated lesion within liver measures water density and therefore is considered benign. Musculoskeletal: No chest wall mass or suspicious bone lesions identified. IMPRESSION: Calcific atherosclerotic disease of the aorta and coronary arteries. Minimal right basilar atelectasis with chronic elevation of right hemidiaphragm. Electronically Signed   By: Fidela Salisbury M.D.   On: 11/12/2016 16:51    Scheduled Meds: . aspirin  81 mg Oral Daily  . baclofen  20 mg Oral TID  . brinzolamide  1 drop Both Eyes BID   Or  . timolol  1 drop Both Eyes BID  . clopidogrel  75 mg Oral Daily  . heparin  5,000  Units Subcutaneous Q8H  . oseltamivir  75 mg Oral BID  . sodium chloride flush  3 mL Intravenous Q12H   Continuous Infusions:   LOS: 0 days   Time spent: 20 minutes   Faye Ramsay, MD [facility] Pager [phone]  If 7PM-7AM, please contact night-coverage [email or web address] Password [facility] 11/13/2016, 5:34 PM

## 2016-11-13 NOTE — Progress Notes (Signed)
Patient was not placed on restraints.

## 2016-11-13 NOTE — Consult Note (Addendum)
Cardiology Consult    Patient ID: Austin [name] MRN: [id number], DOB/AGE: [birth year]   Admit date: 11/11/2016 Date of Consult: 11/13/2016  Primary Physician: Binnie Rail, MD Reason for Consult: Syncope and bradycardia Primary Cardiologist: Dr. Burt Knack Requesting Provider: Dr. Doyle Askew  History of Present Illness     Austin [name] is a 71 y.o. 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 LHC in 4/17 demonstrated severe proximal LAD stenosis and continued patency of the stented segment OM branch with moderate stenosis of the branch before the stented segment and patent RCA with mild diffuse disease. FFR of disease and OM was not hemodynamically significant. He was treated with PCI with DES to the proximal LAD. He had recurrent anginal symptoms in 6/17 and failed medical therapy. Repeat cardiac catheterization demonstrated severe de novo stenosis of OM1 which was treated with a DES. Proximal LAD stent and previous OM1 stents were both patent. Severe small vessel disease with distal PDA, diagonal branches of LAD and apical portion of the LAD was again demonstrated.  He resented to the [facility] ED with complaints of syncope. He had picked up a heavy box of food from the car and was carrying it into the house. After several feet he put the box down and appeared to be passing out. His son grabbed him and assisted him to a chair at which time he was unresponsive, limp and lost control of his bladder and was diaphoretic. His wife call EMS who instructed them to lay him on the floor. Once on the floor he regained consciousness, but was groggy. Prior to the event the patient recalls having chills but no chest pain, dizziness, palpitations.   He was to be discharged today, however, he developed bradycardia with HR in the 40's. We have been asked to evaluate for bradycardia.  He tested positive for the FLu. Currently he  is very sleepy and difficult to get answers from. No chest pain, shortness of breath,    Past Medical History   Past Medical History:  Diagnosis Date  . Allergy   . CAP (community acquired pneumonia)   . Coronary artery disease    03/13/2016 DES to first OM, EF 55%  . Heart murmur    "outgrew it" (03/13/2016)  . Hematuria   . HTN (hypertension)   . Hx of cardiovascular stress test    ETT-Myoview (10/15): Nondiagnostic EKG changes, anterolateral and inferolateral scar, no ischemia, EF 48%, low risk  . Hyperlipemia   . Hyperlipidemia    NMR Lipoprofile 2008: LDL 168 ( 2410/ 1826), HDL 41, TG 71. LDL goal = <100, ideally <  70. Framingham Study LDL goal = < 130.  Marland Kitchen Kidney stones    "passed them"; Dr. Terance Hart (03/13/2016)  . Myocardial infarction 2001  . Non Hodgkin's lymphoma [facility])    [facility]  . Pleural effusion   . TIA (transient ischemic attack) 1980s   "I was exercising when I had it"    Past Surgical History:  Procedure Laterality Date  . CARDIAC CATHETERIZATION N/A 12/22/2015   Procedure: Left Heart Cath and Coronary Angiography;  Surgeon: Sherren Mocha, MD;  Location: [place] [facility];  Service: Cardiovascular;  Laterality: N/A;  . CARDIAC CATHETERIZATION N/A 12/22/2015   Procedure: Coronary Stent Intervention;  Surgeon: Sherren Mocha, MD;  Location: [place] [facility];  Service: Cardiovascular;  Laterality: N/A;  . CARDIAC CATHETERIZATION N/A 12/22/2015  Procedure: Intravascular Pressure Wire/FFR Study;  Surgeon: Sherren Mocha, MD;  Location: [place] [facility];  Service: Cardiovascular;  Laterality: N/A;  . CARDIAC CATHETERIZATION N/A 03/13/2016   Procedure: Left Heart Cath and Coronary Angiography;  Surgeon: Sherren Mocha, MD;  Location: [place] [facility];  Service: Cardiovascular;  Laterality: N/A;  . COLONOSCOPY  2002  . CORONARY ANGIOPLASTY WITH STENT PLACEMENT  ~ 2013   "1 stent"  . ESOPHAGOGASTRODUODENOSCOPY (EGD) WITH ESOPHAGEAL DILATION  2002  .  KNEE ARTHROSCOPY Left 1990s     Allergies  Allergies  Allergen Reactions  . Zetia [Ezetimibe] Shortness Of Breath and Other (See Comments)    Chest pain  . Crestor [Rosuvastatin] Other (See Comments)    Myalgias  . Isosorbide Mononitrate [Isosorbide Nitrate] Other (See Comments)    Headache, throat soreness and hoarse  . Pravachol [Pravastatin Sodium] Other (See Comments)    Myalgias  . Silver Rash    Inpatient Medications    . aspirin  81 mg Oral Daily  . baclofen  20 mg Oral TID  . brinzolamide  1 drop Both Eyes BID   Or  . timolol  1 drop Both Eyes BID  . clopidogrel  75 mg Oral Daily  . heparin  5,000 Units Subcutaneous Q8H  . oseltamivir  75 mg Oral BID  . sodium chloride flush  3 mL Intravenous Q12H    Family History    Family History  Problem Relation Age of Onset  . Cancer Mother     lung cancer  . Diabetes Mother   . Hyperlipidemia Mother   . Stroke Father     age 93  . Cancer Sister     ?lymphoma    Social History    Social History   Social History  . Marital status: Married    Spouse name: N/A  . Number of children: N/A  . Years of education: N/A   Occupational History  . Not on file.   Social History Main Topics  . Smoking status: Never Smoker  . Smokeless tobacco: Never Used  . Alcohol use Yes     Comment: drank some in my 20's"  . Drug use: No  . Sexual activity: Yes   Other Topics Concern  . Not on file   Social History Narrative  . No narrative on file     Review of Systems    General:  Positive for  chills, No fever, night sweats or weight changes.  Cardiovascular:  No chest pain, dyspnea on exertion, edema, orthopnea, palpitations, paroxysmal nocturnal dyspnea. Dermatological: No rash, lesions/masses Respiratory: No cough, dyspnea Urologic: No hematuria, dysuria Abdominal:   No nausea, vomiting, diarrhea, bright red blood per rectum, melena, or hematemesis Neurologic:  No visual changes, wkns, changes in mental  status. All other systems reviewed and are otherwise negative except as noted above.  Physical Exam    Blood pressure (!) 118/54, pulse (!) 48, temperature 97.7 F (36.5 C), temperature source Oral, resp. rate 18, SpO2 100 %.  General: Pleasant, NAD Psych: Normal affect. Neuro: Alert and oriented X 3. Moves all extremities spontaneously. HEENT: Normal  Neck: Supple without bruits or JVD. Lungs:  Resp regular and unlabored, CTA. Heart: RRR no s3, s4, or murmurs. Abdomen: Soft, non-tender, non-distended, BS + x 4.  Extremities: No clubbing, cyanosis or edema. DP/PT/Radials 2+ and equal bilaterally.  Labs    Troponin [facility] Test)  Recent Labs  11/11/16 1633  TROPIPOC 0.01    Recent  Labs  11/11/16 1904 11/12/16 0032 11/12/16 0635 11/13/16 0758  TROPONINI <0.03 <0.03 <0.03 <0.03   Lab Results  Component Value Date   WBC 4.3 11/13/2016   HGB 12.9 (L) 11/13/2016   HCT 38.6 (L) 11/13/2016   MCV 88.3 11/13/2016   PLT 160 11/13/2016    Recent Labs Lab 11/11/16 1606  11/13/16 0758  NA 137  < > 138  K 3.6  < > 3.4*  CL 101  < > 100*  CO2 27  < > 28  BUN 13  < > 12  CREATININE 1.38*  < > 1.24  CALCIUM 9.2  < > 9.3  PROT 6.5  --   --   BILITOT 0.9  --   --   ALKPHOS 69  --   --   ALT 14*  --   --   AST 20  --   --   GLUCOSE 151*  < > 104*  < > = values in this interval not displayed. Lab Results  Component Value Date   CHOL 232 (H) 02/08/2016   HDL 48 02/08/2016   LDLCALC 166 (H) 02/08/2016   TRIG 91 02/08/2016   Lab Results  Component Value Date   DDIMER 0.46 10/31/2013     Radiology Studies    Dg Chest 2 View  Result Date: 11/11/2016 CLINICAL DATA:  Syncopal episode.  Hypotensive EXAM: CHEST  2 VIEW COMPARISON:  09/21/2016 FINDINGS: Normal cardiac silhouette. Chronic elevation of the RIGHT hemidiaphragm. No effusion, infiltrate or pneumothorax. IMPRESSION: Chronic elevation RIGHT hemidiaphragm.  No acute findings. Electronically Signed   By:  Suzy Bouchard M.D.   On: 11/11/2016 16:53   Ct Chest Wo Contrast  Result Date: 11/12/2016 CLINICAL DATA:  Fever and cough. EXAM: CT CHEST WITHOUT CONTRAST TECHNIQUE: Multidetector CT imaging of the chest was performed following the standard protocol without IV contrast. COMPARISON:  Chest radiograph 11/11/2016 FINDINGS: Cardiovascular: Calcific atherosclerotic disease of the coronary arteries and aorta. Coronary stents in place. Small pericardial effusion, likely trivial. Normal heart size. Mediastinum/Nodes: No enlarged mediastinal or axillary lymph nodes. Thyroid gland, trachea, and esophagus demonstrate no significant findings. Lungs/Pleura: Lungs are clear. No pleural effusion or pneumothorax. Minimal right basilar atelectasis with elevation of the right hemidiaphragm. Upper Abdomen: No acute abnormality. 14 mm hypoattenuated lesion within liver measures water density and therefore is considered benign. Musculoskeletal: No chest wall mass or suspicious bone lesions identified. IMPRESSION: Calcific atherosclerotic disease of the aorta and coronary arteries. Minimal right basilar atelectasis with chronic elevation of right hemidiaphragm. Electronically Signed   By: Fidela Salisbury M.D.   On: 11/12/2016 16:51    EKG & Cardiac Imaging    EKG: Sinus bradycardia at 48 bpm  Echocardiogram:   08/30/2016 Study Conclusions  - Left ventricle: The cavity size was normal. Wall thickness was   normal. Systolic function was normal. The estimated ejection   fraction was in the range of 55% to 60%. Although no diagnostic   regional wall motion abnormality was identified, this possibility   cannot be completely excluded on the basis of this study. Doppler   parameters are consistent with abnormal left ventricular   relaxation (grade 1 diastolic dysfunction). - Aortic valve: There was no stenosis. There was trivial   regurgitation. - Aorta: Dilated aortic root. Aortic root dimension: 42 mm (ED). -  Mitral valve: There was no significant regurgitation. - Right ventricle: The cavity size was normal. Systolic function   was normal. - Pericardium, extracardiac: A trivial pericardial  effusion was   identified.  Impressions:  - Normal LV size and systolic function, EF 47-82%. Normal RV size   and systolic function. No significant valvular abnormalities.  LHC  03/13/16  Ost RPDA to RPDA lesion, 50% stenosed.  RPDA lesion, 80% stenosed.  Mid Cx lesion, 100% stenosed.  Lat 1st Diag lesion, 90% stenosed.  2nd Diag lesion, 90% stenosed.  Dist LAD lesion, 90% stenosed.  1st Mrg-2 lesion, 25% stenosed. The lesion was previously treated with a stent (unknown type) greater than two years ago.  1st Mrg-1 lesion, 95% stenosed. Post intervention, there is a 0% residual stenosis.  The left ventricular systolic function is normal.   1. 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 Continued patency of the recently placed proximal LAD stent 3. Severe small vessel CAD involving the distal PDA, diagonal branches of the LAD, and apical portion of the LAD 4. Hypokinesis of the anterolateral wall with preserved overall LV function, LVEF estimated at 55%   Assessment & Plan    Bradycardia -Heart rate was in the 80's on admission. HR began to decrease to the 60's overnight and slowly decreased to the 40's through today.  -Home meds include amlodipine, doxazosin 2 mg, metoprolol 25 mg bid, and he also takes baclofen  -Has been on metoprolol 12.5 here. Hold for now.   Syncope -Occurred while walking after just lifting a heavy box. Found to be hypotensive by EMS. This would be consistent with vasovagal reaction with hypotension. Also differentials include sinus node dysfunction considering today's bradycardia and valvular stenosis causing syncope with valsalva (however, echo in 08/2016 showed no valvular abnormalities). -Had another episode of syncope  this morning. HR 44, BP 73/4. 500 ml NS IV fluids given -Troponins have been negative  -TSH mildly elevated 5.350 -Likely related to acute Flu syndrome while being on several med that lower BP and HR.  HTN -Currently hypotensive. Hold HTN meds. May be related to Flu.  HLD -LDL 166 in 01/2016 -He is intolerant to statins. He was enrolled for a research trial but this was placed on hold when he developed pneumonia  Signed, Austin Perch, NP-C 11/13/2016, 12:44 PM Pager: [phone]  As above, patient seen and examined. Briefly he is a 71 year old male with past medical history of coronary artery disease, hypertension, hyperlipidemia, non-Hodgkin's lymphoma for evaluation of syncope. Patient had last PCI in July 2017 with a drug-eluting stent to his first obtuse marginal. Echocardiogram January 2018 showed normal LV function. Patient was admitted March 17 with a syncopal episode. He was asleep on his couch and his granddaughter woke him up. He stated and picked up a box and after walking 5 steps had syncope. No preceding dyspnea, chest pain, nausea or palpitations. Patient was taken to the emergency room and his sitting blood pressure was 784 systolic and 86 standing. He has been treated with IV fluids with improvement. This morning he arose to go to the bathroom and became somewhat weak. He could not hold his urine and then slipped. He did not have recurrent syncope but his systolic blood pressure was in the 70s. His heart rate has been in the mid to high 40s. Cardiology asked to evaluate. Electrocardiogram shows sinus rhythm with occasional PVC. RV conduction delay. Inferior infarct. Lateral T-wave inversion. Enzymes negative.  1 syncope-this is likely orthostatic mediated based on history and blood pressure on arrival to the emergency room. Note he is also testing positive for influenza.  This could contribute as well. LV function normal on recent echocardiogram. Agree with hydration. He is  somewhat bradycardic on telemetry but without symptoms. Would discontinue metoprolol. Patient instructed not to drive until he is at least reassessed in the office.   2 hypertension-I agree withholding antihypertensive medications for now. Follow blood pressure and adjust regimen as needed.  3 coronary artery disease-continue aspirin and Plavix. He is intolerant to statins.   If blood pressure improved in the morning without recurrent symptoms he can be discharged and follow-up with Dr. Burt Knack.  Kirk Ruths, MD

## 2016-11-13 NOTE — Progress Notes (Addendum)
Pt had another syncopal event this AM, asked RN to check orthostatics again, HR in 40's, will stop metoprolol for now and consult with cardiology as pt with significant cardiac issues. Hold off on discharge until pt seen by cardio team.   Addendum: Flu +, added tamiflu.   Faye Ramsay, MD  [facility] Pager [phone]  If 7PM-7AM, please contact night-coverage [email or web address] Password [email or web address]

## 2016-11-13 NOTE — Progress Notes (Signed)
Evaluated patient after being noted to have unwitnessed fall. Initial patient noted to be disoriented. Vital signs temperature 98.6, heart rate 44, respirations 18, blood pressure 73/41, and O2 saturation maintained on room air. On evaluation patient is cool to the touch, but AOX3 at the time of my evaluation. Ordered for the patient given 1 L bolus. Check troponin, TSH, orthostatic VS after bolus, and a.m. cortisol level. Question cause of symptoms at this time.

## 2016-11-14 DIAGNOSIS — R55 Syncope and collapse: Secondary | ICD-10-CM

## 2016-11-14 DIAGNOSIS — R001 Bradycardia, unspecified: Secondary | ICD-10-CM

## 2016-11-14 LAB — CBC
HEMATOCRIT: 36.2 % — AB (ref 39.0–52.0)
HEMOGLOBIN: 12.2 g/dL — AB (ref 13.0–17.0)
MCH: 29.3 pg (ref 26.0–34.0)
MCHC: 33.7 g/dL (ref 30.0–36.0)
MCV: 86.8 fL (ref 78.0–100.0)
Platelets: 169 10*3/uL (ref 150–400)
RBC: 4.17 MIL/uL — ABNORMAL LOW (ref 4.22–5.81)
RDW: 13.7 % (ref 11.5–15.5)
WBC: 3.1 10*3/uL — ABNORMAL LOW (ref 4.0–10.5)

## 2016-11-14 LAB — BASIC METABOLIC PANEL
ANION GAP: 9 (ref 5–15)
BUN: 13 mg/dL (ref 6–20)
CHLORIDE: 101 mmol/L (ref 101–111)
CO2: 29 mmol/L (ref 22–32)
CREATININE: 1.21 mg/dL (ref 0.61–1.24)
Calcium: 9.5 mg/dL (ref 8.9–10.3)
GFR calc non Af Amer: 58 mL/min — ABNORMAL LOW (ref >60)
GLUCOSE: 95 mg/dL (ref 65–99)
Potassium: 3.6 mmol/L (ref 3.5–5.1)
Sodium: 139 mmol/L (ref 135–145)

## 2016-11-14 MED ORDER — OSELTAMIVIR PHOSPHATE 75 MG PO CAPS: 75.0000 mg | ORAL_CAPSULE | Freq: Two times a day (BID) | ORAL | 0 refills | Status: DC

## 2016-11-14 MED ORDER — FINASTERIDE 5 MG PO TABS
5.0000 mg | ORAL_TABLET | Freq: Every day | ORAL | Status: DC
  Administered 2016-11-14: 5 mg via ORAL
  Filled 2016-11-14: qty 1

## 2016-11-14 MED ORDER — FINASTERIDE 5 MG PO TABS: 5.0000 mg | ORAL_TABLET | Freq: Every day | ORAL | 0 refills | Status: DC

## 2016-11-14 MED ORDER — AMLODIPINE BESYLATE 10 MG PO TABS: 5.0000 mg | ORAL_TABLET | Freq: Every day | ORAL | 0 refills | Status: DC

## 2016-11-14 MED ORDER — AMLODIPINE BESYLATE 5 MG PO TABS
5.0000 mg | ORAL_TABLET | Freq: Every day | ORAL | Status: DC
  Administered 2016-11-14: 5 mg via ORAL
  Filled 2016-11-14: qty 1

## 2016-11-14 NOTE — Progress Notes (Signed)
NURSING PROGRESS NOTE  Austin [name] [id number] Discharge Data: 11/14/2016 1:20 PM Attending Provider: Patrecia Pour, MD [id number] Lorretta Harp, MD     [name] to be D/C'd Home per MD order.  Discussed with the patient the After Visit Summary and all questions fully answered. All IV's discontinued with no bleeding noted. All belongings returned to patient for patient to take home.   Last Vital Signs:  Blood pressure (!) 148/77, pulse (!) 51, temperature 98.2 F (36.8 C), temperature source Oral, resp. rate 18, SpO2 100 %.  Discharge Medication List Allergies as of 11/14/2016      Reactions   Zetia [ezetimibe] Shortness Of Breath, Other (See Comments)   Chest pain   Crestor [rosuvastatin] Other (See Comments)   Myalgias   Isosorbide Mononitrate [isosorbide Nitrate] Other (See Comments)   Headache, throat soreness and hoarse   Pravachol [pravastatin Sodium] Other (See Comments)   Myalgias   Silver Rash      Medication List    STOP taking these medications   doxazosin 2 MG tablet Commonly known as:  CARDURA   metoprolol tartrate 25 MG tablet Commonly known as:  LOPRESSOR     TAKE these medications   amLODipine 10 MG tablet Commonly known as:  NORVASC Take 0.5 tablets (5 mg total) by mouth daily. What changed:  how much to take   aspirin 81 MG tablet Take 81 mg by mouth daily.   baclofen 20 MG tablet Commonly known as:  LIORESAL Take 1 tablet (20 mg total) by mouth 3 (three) times daily. What changed:  when to take this  reasons to take this   clopidogrel 75 MG tablet Commonly known as:  PLAVIX TAKE 1 TABLET(75 MG) BY MOUTH DAILY   COUGH DROPS MT Use as directed 1 lozenge in the mouth or throat daily as needed (cough).   dorzolamide-timolol 22.3-6.8 MG/ML ophthalmic solution Commonly known as:  COSOPT Place 1 drop into both eyes 2 (two) times daily.   finasteride 5 MG tablet Commonly known as:  PROSCAR Take 1 tablet (5 mg total) by mouth  daily. Start taking on:  11/15/2016   nitroGLYCERIN 0.4 MG SL tablet Commonly known as:  NITROSTAT PLACE AND DISSOLVE 1 TABLET UNDER THE TONGUE EVERY 5 MINUTES AS NEEDED FOR CHEST PAIN   oseltamivir 75 MG capsule Commonly known as:  TAMIFLU Take 1 capsule (75 mg total) by mouth 2 (two) times daily.   timolol 0.25 % ophthalmic solution Commonly known as:  BETIMOL Place 1 drop into both eyes 2 (two) times daily.

## 2016-11-14 NOTE — Discharge Summary (Signed)
Physician Discharge Summary  Austin [name] XBD:[id number] DOB: [birth year] DOA: 11/11/2016  PCP: Binnie Rail, MD  Admit date: 11/11/2016 Discharge date: 11/16/2016  Admitted From: Home Disposition: Home   Recommendations for Outpatient Follow-up:  1. Follow up with PCP in 1-2 weeks. 2. Follow up with cardiology. Dr. Jacalyn Lefevre note from 3/20 states they will arrange follow up with Dr. Burt Knack or one of their assistants. Patient is not to drive until that follow up appointment.  3. Please obtain BMP/CBC in one week 4. Recheck thyroid function. TSH very mildly elevated at 5.350 in setting of bradycardia.    Home Health: None Equipment/Devices: None Discharge Condition: Stable CODE STATUS: Full Diet recommendation: Heart healthy  Brief/Interim Summary: Austin [name] is a 71 y.o.malewith known coronary artery disease status post 4 stents, essential hypertension, hyperlipidemia, who presented to the hospital after developing syncope. Patient reported he was sitting down and stood up and carried a heavy box and subsequently passed out. He denies any other symptoms other than chills. Presentation was thought to be due to orthostasis. Flu B was positive and tamiflu was started. He had a single other episode of weakness/syncope on 3/19, thought to be related to bradycardia. Cardiology was consulted and recommended stopping beta blocker and changing cardura to finasteride. Amlodipine was restarted at lower dose to accommodate these changes. Physical therapy evaluated the patient and made no follow up or DME recommendations prior to discharge. He will follow up in cardiology clinic, and is counseled not to drive in the interim.   Discharge Diagnoses:  Active Problems:   Anemia   Essential hypertension   Hyperlipidemia   Allergic rhinitis   Coronary artery disease involving native coronary artery with angina pectoris (HCC)   Syncope and collapse   Syncope   Bradycardia  Syncopal  event: Orthostatic with +influenza and possibly symptomatic bradycardia - Improved subjectively with IVF's and had no further syncopal episodes after stopping metoprolol.  - ECHO done recently 08/2016 and with stable EF, grade 1 diastolic CHF - CE's x 3 negative  - PT eval done, no further recommendations     Bradycardia - possibly contributing to syncopal events - stopped metoprolol, cardiology consulted    Fever, influenza B + - started Tamiflu    Leukopenia - mild, possibly reactive - resolved  - Consider repeat CBC at follow up    Thrombocytopenia - mild, reactive - resolved  - Consider repeat CBC in AM    Essential hypertension - reasonably stable with addition of amlodipine 5mg   BPH: Changed cardura to finasteride.     Coronary artery disease involving native coronary artery with angina pectoris ([place]) - no chest pain  Discharge Instructions Discharge Instructions    Discharge instructions    Complete by:  As directed    You were admitted with orthostatic syncope (passing out due to decreased blood pressure) which has resolved. You also tested positive for the flu. You are stable for discharge with the following recommendations:  - Take tamiflu twice daily as directed to complete a 5 day course.  - STOP taking metoprolol - CHANGE amlodipine (norvasc) to 5mg  (down from 10mg ) daily - STOP taking cardura and REPLACE it with finasteride daily to treat enlarged prostate - Follow up with cardiology as scheduled.  - Do not drive until you follow up.  - If your symptoms return, seek medical attention right away.   Increase activity slowly    Complete by:  As directed      Allergies as  of 11/14/2016      Reactions   Zetia [ezetimibe] Shortness Of Breath, Other (See Comments)   Chest pain   Crestor [rosuvastatin] Other (See Comments)   Myalgias   Isosorbide Mononitrate [isosorbide Nitrate] Other (See Comments)   Headache, throat soreness and hoarse   Pravachol  [pravastatin Sodium] Other (See Comments)   Myalgias   Silver Rash      Medication List    STOP taking these medications   doxazosin 2 MG tablet Commonly known as:  CARDURA   metoprolol tartrate 25 MG tablet Commonly known as:  LOPRESSOR     TAKE these medications   amLODipine 10 MG tablet Commonly known as:  NORVASC Take 0.5 tablets (5 mg total) by mouth daily. What changed:  how much to take   aspirin 81 MG tablet Take 81 mg by mouth daily.   baclofen 20 MG tablet Commonly known as:  LIORESAL Take 1 tablet (20 mg total) by mouth 3 (three) times daily. What changed:  when to take this  reasons to take this   clopidogrel 75 MG tablet Commonly known as:  PLAVIX TAKE 1 TABLET(75 MG) BY MOUTH DAILY   COUGH DROPS MT Use as directed 1 lozenge in the mouth or throat daily as needed (cough).   dorzolamide-timolol 22.3-6.8 MG/ML ophthalmic solution Commonly known as:  COSOPT Place 1 drop into both eyes 2 (two) times daily.   finasteride 5 MG tablet Commonly known as:  PROSCAR Take 1 tablet (5 mg total) by mouth daily.   nitroGLYCERIN 0.4 MG SL tablet Commonly known as:  NITROSTAT PLACE AND DISSOLVE 1 TABLET UNDER THE TONGUE EVERY 5 MINUTES AS NEEDED FOR CHEST PAIN   oseltamivir 75 MG capsule Commonly known as:  TAMIFLU Take 1 capsule (75 mg total) by mouth 2 (two) times daily.   timolol 0.25 % ophthalmic solution Commonly known as:  BETIMOL Place 1 drop into both eyes 2 (two) times daily.      Follow-up Information    Bhagat,Bhavinkumar, PA Follow up on 11/30/2016.   Specialty:  Cardiology Why:  at 3:00 for cardiology hospital follow up.  No driving until seen at this follow up appointment.  Contact information: [address] [place] [address] [phone]        Binnie Rail, MD. Schedule an appointment as soon as possible for a visit in 1 week(s).   Specialty:  Internal Medicine Contact information: [place]  [address] [phone]          Allergies  Allergen Reactions  . Zetia [Ezetimibe] Shortness Of Breath and Other (See Comments)    Chest pain  . Crestor [Rosuvastatin] Other (See Comments)    Myalgias  . Isosorbide Mononitrate [Isosorbide Nitrate] Other (See Comments)    Headache, throat soreness and hoarse  . Pravachol [Pravastatin Sodium] Other (See Comments)    Myalgias  . Silver Rash    Consultations: Cardiology, Dr. Stanford Breed  Procedures/Studies: Dg Chest 2 View  Result Date: 11/11/2016 CLINICAL DATA:  Syncopal episode.  Hypotensive EXAM: CHEST  2 VIEW COMPARISON:  09/21/2016 FINDINGS: Normal cardiac silhouette. Chronic elevation of the RIGHT hemidiaphragm. No effusion, infiltrate or pneumothorax. IMPRESSION: Chronic elevation RIGHT hemidiaphragm.  No acute findings. Electronically Signed   By: Suzy Bouchard M.D.   On: 11/11/2016 16:53   Ct Chest Wo Contrast  Result Date: 11/12/2016 CLINICAL DATA:  Fever and cough. EXAM: CT CHEST WITHOUT CONTRAST TECHNIQUE: Multidetector CT imaging of the chest was  performed following the standard protocol without IV contrast. COMPARISON:  Chest radiograph 11/11/2016 FINDINGS: Cardiovascular: Calcific atherosclerotic disease of the coronary arteries and aorta. Coronary stents in place. Small pericardial effusion, likely trivial. Normal heart size. Mediastinum/Nodes: No enlarged mediastinal or axillary lymph nodes. Thyroid gland, trachea, and esophagus demonstrate no significant findings. Lungs/Pleura: Lungs are clear. No pleural effusion or pneumothorax. Minimal right basilar atelectasis with elevation of the right hemidiaphragm. Upper Abdomen: No acute abnormality. 14 mm hypoattenuated lesion within liver measures water density and therefore is considered benign. Musculoskeletal: No chest wall mass or suspicious bone lesions identified. IMPRESSION: Calcific atherosclerotic disease of the aorta and coronary arteries. Minimal right basilar  atelectasis with chronic elevation of right hemidiaphragm. Electronically Signed   By: Fidela Salisbury M.D.   On: 11/12/2016 16:51    Subjective: Pt feels well, no further syncope. Wants to go home.   Discharge Exam: Vitals:   11/14/16 0607 11/14/16 1051  BP: (!) 142/70 (!) 148/77  Pulse: (!) 50 (!) 51  Resp: 18   Temp: 98.2 F (36.8 C)    General: Pt is alert, awake, not in acute distress Cardiovascular: RRR, S1/S2 +, no rubs, no gallops Respiratory: CTA bilaterally, no wheezing, no rhonchi Abdominal: Soft, NT, ND, bowel sounds + Extremities: No edema, no cyanosis  The results of significant diagnostics from this hospitalization (including imaging, microbiology, ancillary and laboratory) are listed below for reference.    Labs: Basic Metabolic Panel:  Recent Labs Lab 11/11/16 1606 11/11/16 1904 11/12/16 0635 11/13/16 0758 11/14/16 0625  NA 137  --  139 138 139  K 3.6  --  3.5 3.4* 3.6  CL 101  --  104 100* 101  CO2 27  --  27 28 29   GLUCOSE 151*  --  102* 104* 95  BUN 13  --  10 12 13   CREATININE 1.38* 1.11 1.08 1.24 1.21  CALCIUM 9.2  --  9.1 9.3 9.5  MG  --   --  2.0  --   --   PHOS  --   --  3.2  --   --    Liver Function Tests:  Recent Labs Lab 11/11/16 1606  AST 20  ALT 14*  ALKPHOS 69  BILITOT 0.9  PROT 6.5  ALBUMIN 3.6   CBC:  Recent Labs Lab 11/11/16 1606 11/11/16 1904 11/12/16 0635 11/13/16 0758 11/14/16 0625  WBC 3.3* 3.5* 3.5* 4.3 3.1*  NEUTROABS 2.1  --   --   --   --   HGB 10.8* 11.9* 11.2* 12.9* 12.2*  HCT 31.8* 34.2* 32.9* 38.6* 36.2*  MCV 87.8 87.5 87.5 88.3 86.8  PLT 149* 155 149* 160 169   Cardiac Enzymes:  Recent Labs Lab 11/11/16 1904 11/12/16 0032 11/12/16 0635 11/13/16 0758  TROPONINI <0.03 <0.03 <0.03 <0.03   CBG:  Recent Labs Lab 11/13/16 0506  GLUCAP 138*   Thyroid function studies No results for input(s): TSH, T4TOTAL, T3FREE, THYROIDAB in the last 72 hours.  Invalid input(s):  FREET3 Urinalysis    Component Value Date/Time   COLORURINE YELLOW 11/11/2016 1737   APPEARANCEUR CLEAR 11/11/2016 1737   LABSPEC 1.010 11/11/2016 1737   PHURINE 5.0 11/11/2016 1737   GLUCOSEU NEGATIVE 11/11/2016 1737   HGBUR SMALL (A) 11/11/2016 1737   HGBUR large 12/15/2009 1149   BILIRUBINUR NEGATIVE 11/11/2016 1737   BILIRUBINUR Neg 05/10/2012 1706   KETONESUR NEGATIVE 11/11/2016 1737   PROTEINUR NEGATIVE 11/11/2016 1737   UROBILINOGEN 0.2 05/10/2012 1706   UROBILINOGEN 0.2  12/15/2009 1149   NITRITE NEGATIVE 11/11/2016 1737   LEUKOCYTESUR NEGATIVE 11/11/2016 1737   Time coordinating discharge: Approximately 40 minutes  Vance Gather, MD  [facility] 11/16/2016, 5:09 PM Pager [phone]  If 7PM-7AM, please contact night-coverage [email or web address] Password [email or web address]

## 2016-11-14 NOTE — Progress Notes (Addendum)
Progress Note  Patient Name: Austin [name] Date of Encounter: 11/14/2016  Primary Cardiologist: Dr Burt Knack  Subjective   No further syncope  Inpatient Medications    Scheduled Meds: . aspirin  81 mg Oral Daily  . baclofen  20 mg Oral TID  . brinzolamide  1 drop Both Eyes BID   Or  . timolol  1 drop Both Eyes BID  . clopidogrel  75 mg Oral Daily  . heparin  5,000 Units Subcutaneous Q8H  . oseltamivir  75 mg Oral BID  . sodium chloride flush  3 mL Intravenous Q12H   Continuous Infusions:  PRN Meds: nitroGLYCERIN   Vital Signs    Vitals:   11/13/16 2334 11/13/16 2338 11/13/16 2339 11/14/16 0607  BP: 125/73 117/77 118/70 (!) 142/70  Pulse: 65 76 77 (!) 50  Resp: 18 18 18 18   Temp:    98.2 F (36.8 C)  TempSrc:    Oral  SpO2: 100% 100% 100% 100%    Intake/Output Summary (Last 24 hours) at 11/14/16 0912 Last data filed at 11/14/16 8921  Gross per 24 hour  Intake               60 ml  Output              225 ml  Net             -165 ml   There were no vitals filed for this visit.  Telemetry    NSR, SB 45-50, rare PVC- Personally Reviewed  ECG    NSR, SB 48, QTc 410 - Personally Reviewed  Physical Exam    GEN: No acute distress.   Neck: No JVD Cardiac: RRR, no murmurs, rubs, or gallops.  Respiratory: Clear to auscultation bilaterally. GI: Soft, nontender, non-distended  MS: No edema; No deformity. Neuro:  Nonfocal  Psych: Normal affect   Labs    Chemistry Recent Labs Lab 11/11/16 1606  11/12/16 0635 11/13/16 0758 11/14/16 0625  NA 137  --  139 138 139  K 3.6  --  3.5 3.4* 3.6  CL 101  --  104 100* 101  CO2 27  --  27 28 29   GLUCOSE 194*  --  102* 104* 95  BUN 13  --  10 12 13   CREATININE 1.38*  < > 1.08 1.24 1.21  CALCIUM 9.2  --  9.1 9.3 9.5  PROT 6.5  --   --   --   --   ALBUMIN 3.6  --   --   --   --   AST 20  --   --   --   --   ALT 14*  --   --   --   --   ALKPHOS 69  --   --   --   --   BILITOT 0.9  --   --   --   --     GFRNONAA 50*  < > >60 57* 58*  GFRAA 58*  < > >60 >60 >60  ANIONGAP 9  --  8 10 9   < > = values in this interval not displayed.   Hematology Recent Labs Lab 11/12/16 0635 11/13/16 0758 11/14/16 0625  WBC 3.5* 4.3 3.1*  RBC 3.76* 4.37 4.17*  HGB 11.2* 12.9* 12.2*  HCT 32.9* 38.6* 36.2*  MCV 87.5 88.3 86.8  MCH 29.8 29.5 29.3  MCHC 34.0 33.4 33.7  RDW 14.1 13.8 13.7  PLT 149* 160 169  Cardiac Enzymes Recent Labs Lab 11/11/16 1904 11/12/16 0032 11/12/16 0635 11/13/16 0758  TROPONINI <0.03 <0.03 <0.03 <0.03    Recent Labs Lab 11/11/16 1633  TROPIPOC 0.01     BNPNo results for input(s): BNP, PROBNP in the last 168 hours.   DDimer No results for input(s): DDIMER in the last 168 hours.   Radiology    Ct Chest Wo Contrast  Result Date: 11/12/2016 CLINICAL DATA:  Fever and cough. EXAM: CT CHEST WITHOUT CONTRAST TECHNIQUE: Multidetector CT imaging of the chest was performed following the standard protocol without IV contrast. COMPARISON:  Chest radiograph 11/11/2016 FINDINGS: Cardiovascular: Calcific atherosclerotic disease of the coronary arteries and aorta. Coronary stents in place. Small pericardial effusion, likely trivial. Normal heart size. Mediastinum/Nodes: No enlarged mediastinal or axillary lymph nodes. Thyroid gland, trachea, and esophagus demonstrate no significant findings. Lungs/Pleura: Lungs are clear. No pleural effusion or pneumothorax. Minimal right basilar atelectasis with elevation of the right hemidiaphragm. Upper Abdomen: No acute abnormality. 14 mm hypoattenuated lesion within liver measures water density and therefore is considered benign. Musculoskeletal: No chest wall mass or suspicious bone lesions identified. IMPRESSION: Calcific atherosclerotic disease of the aorta and coronary arteries. Minimal right basilar atelectasis with chronic elevation of right hemidiaphragm. Electronically Signed   By: Fidela Salisbury M.D.   On: 11/12/2016 16:51     Cardiac Studies     Patient Profile     71 y.o. male with CAD as described. Admitted after a syncopal spell. He tested positive for the Flu. He was hypotensive and bradycardic and his medications were adjusted.  Assessment & Plan    Bradycardia -Heart rate was in the 80's on admission. HR began to decrease to the 60's overnight and slowly decreased to the 40's.  -Home meds included amlodipine, doxazosin 2 mg, metoprolol 25 mg bid, and he also takes baclofen  -Metoprolol has been stopped  Syncope -Occurred while walking after just lifting a heavy box. Found to be hypotensive by EMS. This would be consistent with vasovagal reaction with hypotension and possible bradycardia possible related to acute Flu syndrome while being on several med that lower BP and HR.  HTN -B/P stable this am. Consider starting Norvasc 5 mg  HLD -LDL 166 in 01/2016 -He is intolerant to statins. He was enrolled for a research trial but this was placed on hold after he developed pneumonia in Jan  CAD- CFX BMS 2012, LAD DES April 2017, OM1 DES June 2017.   Plan- Resume Norvasc 5 mg, Stop Cardura- try Proscar 5 mg. F/U with Dr Burt Knack to be arranged.   Signed, Kerin Ransom, PA-C  11/14/2016, 9:12 AM    As above, patient seen and examined. Patient denies dyspnea, chest pain, dizziness or syncope. Telemetry has been reviewed. He has sinus bradycardia with rates in the 40s at times but no related symptoms. His presentation is was most consistent with orthostatic hypotension; also positive for influenza. His LV function is normal. We will discontinue his metoprolol given bradycardia. His blood pressure is increasing. We will resume amlodipine at lower dose of 5 mg daily. Discontinue Cardura and treat with Proscar 5 mg daily for BPH. Patient can be discharged from a cardiac standpoint. We will arrange follow-up with Dr. Burt Knack or one of our assistants. Patient instructed not to drive until seen back in the office.  Given he has a reason for his syncope I do not think he needs to be prohibited for 6 months.  Kirk Ruths, MD

## 2016-11-15 ENCOUNTER — Telehealth: Payer: Self-pay

## 2016-11-15 NOTE — Telephone Encounter (Addendum)
Telephone call made by accident. SG

## 2016-11-17 ENCOUNTER — Encounter: Payer: Self-pay | Admitting: Physician Assistant

## 2016-11-30 ENCOUNTER — Encounter: Payer: Self-pay | Admitting: Physician Assistant

## 2016-11-30 ENCOUNTER — Ambulatory Visit (INDEPENDENT_AMBULATORY_CARE_PROVIDER_SITE_OTHER): Payer: Medicare HMO | Admitting: Physician Assistant

## 2016-11-30 VITALS — BP 120/70 | HR 74 | Ht 73.0 in | Wt 202.4 lb

## 2016-11-30 DIAGNOSIS — I10 Essential (primary) hypertension: Secondary | ICD-10-CM

## 2016-11-30 DIAGNOSIS — I25119 Atherosclerotic heart disease of native coronary artery with unspecified angina pectoris: Secondary | ICD-10-CM | POA: Diagnosis not present

## 2016-11-30 DIAGNOSIS — R55 Syncope and collapse: Secondary | ICD-10-CM

## 2016-11-30 DIAGNOSIS — E785 Hyperlipidemia, unspecified: Secondary | ICD-10-CM

## 2016-11-30 MED ORDER — CLOPIDOGREL BISULFATE 75 MG PO TABS: ORAL_TABLET | ORAL | 3 refills | Status: DC

## 2016-11-30 MED ORDER — ASPIRIN 81 MG PO TABS: 81.0000 mg | ORAL_TABLET | Freq: Every day | ORAL | Status: AC

## 2016-11-30 NOTE — Patient Instructions (Signed)
Medication Instructions:  Please restart Plavix 75 mg a day, ASA 81 mg a day and as needed Nitroglycerin. Continue all other medications as listed.  Follow-Up: Follow up in 4 to 6 months with Dr. Burt Knack.  You will receive a letter in the mail 2 months before you are due.  Please call us when you receive this letter to schedule your follow up appointment.  If you need a refill on your cardiac medications before your next appointment, please call your pharmacy.  Thank you for choosing [facility]!!

## 2016-11-30 NOTE — Progress Notes (Signed)
Cardiology Office Note    Date:  11/30/2016   ID:  Austin [name], DOB [birth year], MRN [id number]  PCP:  Binnie Rail, MD  Cardiologist:  Dr. Burt Knack  Chief Complaint: Hospital follow up for syncope and bradycardia  History of Present Illness:   Austin [name] is a 71 y.o. male with hx of CAD, HTN, HLD, TIA, non-Hodgkin lymphoma presents for hospital follow up.   Hx of lateral wall MI in 2012 treated with BMS to LCx. LHC in 4/17 demonstrated severe proximal LAD stenosis and continued patency of the stented segment OM branch with moderate stenosis of the branch before the stented segment and patent RCA with mild diffuse disease. FFR of disease and OM was not hemodynamically significant. He was treated with PCI with DES to the proximal LAD. He had recurrent anginal symptoms in 6/17 and failed medical therapy. Repeat cardiac catheterization demonstrated severe de novo stenosis of OM1 which was treated with a DES. Proximal LAD stent and previous OM1 stentswere both patent. Severe small vessel disease with distal PDA, diagonal branches of LAD and apical portion of the LAD was again demonstrated.  Admitted 3/17-3/22 for syncope while walking after just lifting a heavy box. This is likely orthostatic mediated based on history. Resting positive for influenza. Noted somewhat bradycardic. Discontinued metoprolol. Normal echo 08/2016. Resume amlodipine at lower dose of 5 mg daily. Discontinued Cardura and treat with Proscar 5 mg daily for BPH. Symptoms improved with IV hydration.   He had today for follow-up. Further episode of syncope. Denies lightheadedness, dizziness, chest pain, palpitation, shortness of breath, orthopnea, PND, syncope, lower extremity edema, melena or blood in her stool or urine. Patient has not taken aspirin and Plavix since discharge. He states this is advised by his doctor at discharge, however not documentation.   Past Medical History:  Diagnosis Date  . Allergy   .  CAP (community acquired pneumonia)   . Coronary artery disease    03/13/2016 DES to first OM, EF 55%  . Heart murmur    "outgrew it" (03/13/2016)  . Hematuria   . HTN (hypertension)   . Hx of cardiovascular stress test    ETT-Myoview (10/15): Nondiagnostic EKG changes, anterolateral and inferolateral scar, no ischemia, EF 48%, low risk  . Hyperlipemia   . Hyperlipidemia    NMR Lipoprofile 2008: LDL 168 ( 2410/ 1826), HDL 41, TG 71. LDL goal = <100, ideally <  70. Framingham Study LDL goal = < 130.  Marland Kitchen Kidney stones    "passed them"; Dr. Terance Hart (03/13/2016)  . Myocardial infarction 2001  . Non Hodgkin's [facility])    [facility]  . Pleural effusion   . TIA (transient ischemic attack) 1980s   "I was exercising when I had it"    Past Surgical History:  Procedure Laterality Date  . CARDIAC CATHETERIZATION N/A 12/22/2015   Procedure: Left Heart Cath and Coronary Angiography;  Surgeon: Sherren Mocha, MD;  Location: [facility];  Service: Cardiovascular;  Laterality: N/A;  . CARDIAC CATHETERIZATION N/A 12/22/2015   Procedure: Coronary Stent Intervention;  Surgeon: Sherren Mocha, MD;  Location: [place] [facility];  Service: Cardiovascular;  Laterality: N/A;  . CARDIAC CATHETERIZATION N/A 12/22/2015   Procedure: Intravascular Pressure Wire/FFR Study;  Surgeon: Sherren Mocha, MD;  Location: [place] [facility];  Service: Cardiovascular;  Laterality: N/A;  . CARDIAC CATHETERIZATION N/A 03/13/2016   Procedure: Left Heart Cath and Coronary Angiography;  Surgeon: Sherren Mocha, MD;  Location[facility]  INVASIVE [facility];  Service: Cardiovascular;  Laterality: N/A;  . COLONOSCOPY  2002  . CORONARY ANGIOPLASTY WITH STENT PLACEMENT  ~ 2013   "1 stent"  . ESOPHAGOGASTRODUODENOSCOPY (EGD) WITH ESOPHAGEAL DILATION  2002  . KNEE ARTHROSCOPY Left 1990s    Current Medications:  Prior to Admission medications   Medication Sig Start Date End Date Taking? Authorizing Provider  amLODipine  (NORVASC) 10 MG tablet Take 0.5 tablets (5 mg total) by mouth daily. 11/14/16  Yes Patrecia Pour, MD  finasteride (PROSCAR) 5 MG tablet Take 1 tablet (5 mg total) by mouth daily. 11/15/16  Yes Patrecia Pour, MD  timolol (BETIMOL) 0.25 % ophthalmic solution Place 1 drop into both eyes 2 (two) times daily.    Yes Historical Provider, MD    Allergies:   Zetia [ezetimibe]; Crestor [rosuvastatin]; Isosorbide mononitrate [isosorbide nitrate]; Pravachol [pravastatin sodium]; and Silver   Social History   Social History  . Marital status: Married    Spouse name: N/A  . Number of children: N/A  . Years of education: N/A   Social History Main Topics  . Smoking status: Never Smoker  . Smokeless tobacco: Never Used  . Alcohol use Yes     Comment: drank some in my 20's"  . Drug use: No  . Sexual activity: Yes   Other Topics Concern  . None   Social History Narrative  . None     Family History:  The patient's family history includes Cancer in his mother and sister; Diabetes in his mother; Hyperlipidemia in his mother; Stroke in his father.   ROS:   Please see the history of present illness.    ROS All other systems reviewed and are negative.   PHYSICAL EXAM:   VS:  BP 120/70 (BP Location: Right Arm, Patient Position: Sitting, Cuff Size: Normal)   Pulse 74   Ht 6\' 1"  (1.854 m)   Wt 202 lb 6.4 oz (91.8 kg)   SpO2 98%   BMI 26.70 kg/m    GEN: Well nourished, well developed, in no acute distress  HEENT: normal  Neck: no JVD, carotid bruits, or masses Cardiac: RRR; no murmurs, rubs, or gallops,no edema  Respiratory:  clear to auscultation bilaterally, normal work of breathing GI: soft, nontender, nondistended, + BS MS: no deformity or atrophy  Skin: warm and dry, no rash Neuro:  Alert and Oriented x 3, Strength and sensation are intact Psych: euthymic mood, full affect  Wt Readings from Last 3 Encounters:  11/30/16 202 lb 6.4 oz (91.8 kg)  11/08/16 202 lb 1.9 oz (91.7 kg)    09/22/16 199 lb (90.3 kg)      Studies/Labs Reviewed:   EKG:  EKG is not ordered today.    Recent Labs: 11/11/2016: ALT 14 11/12/2016: Magnesium 2.0 11/13/2016: TSH 5.350 11/14/2016: BUN 13; Creatinine, Ser 1.21; Hemoglobin 12.2; Platelets 169; Potassium 3.6; Sodium 139   Lipid Panel    Component Value Date/Time   CHOL 232 (H) 02/08/2016 1009   TRIG 91 02/08/2016 1009   HDL 48 02/08/2016 1009   CHOLHDL 4.8 02/08/2016 1009   VLDL 18 02/08/2016 1009   LDLCALC 166 (H) 02/08/2016 1009   LDLDIRECT 173.2 07/21/2013 0809    Additional studies/ records that were reviewed today include:   Echocardiogram: 08/30/16 - Left ventricle: The cavity size was normal. Wall thickness was normal. Systolic function was normal. The estimated ejection fraction was in the range of 55% to 60%. Although no diagnostic regional wall motion abnormality  was identified, this possibility cannot be completely excluded on the basis of this study. Doppler parameters are consistent with abnormal left ventricular relaxation (grade 1 diastolic dysfunction). - Aortic valve: There was no stenosis. There was trivial regurgitation. - Aorta: Dilated aortic root. Aortic root dimension: 42 mm (ED). - Mitral valve: There was no significant regurgitation. - Right ventricle: The cavity size was normal. Systolic function was normal. - Pericardium, extracardiac: A trivial pericardial effusion was identified.  Impressions:  - Normal LV size and systolic function, EF 47-65%. Normal RV size and systolic function. No significant valvular abnormalities.  Cardiac Catheterization: 03/13/16   Ost RPDA to RPDA lesion, 50% stenosed.  RPDA lesion, 80% stenosed.  Mid Cx lesion, 100% stenosed.  Lat 1st Diag lesion, 90% stenosed.  2nd Diag lesion, 90% stenosed.  Dist LAD lesion, 90% stenosed.  1st Mrg-2 lesion, 25% stenosed. The lesion was previously treated with a stent (unknown type) greater than  two years ago.  1st Mrg-1 lesion, 95% stenosed. Post intervention, there is a 0% residual stenosis.  The left ventricular systolic function is normal.  1. 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 Continued patency of the recently placed proximal LAD stent 3. Severe small vessel CAD involving the distal PDA, diagonal branches of the LAD, and apical portion of the LAD 4. Hypokinesis of the anterolateral wall with preserved overall LV function, LVEF estimated at 55%  ASSESSMENT & PLAN:    1. CAD- CFX BMS 2012, LAD DES April 2017, OM1 DES June 2017.  - He also has distal vessel disease that is treated medically. Not taking ASA and Plavix since discharge. No chest pain or dyspnea. Will resume.   2. Syncope - No further episode. Likely orthostatic.   3. Bradycardia - Discontinue BB. Pulse stable.   4. HTN - Stable on amlodipine 5mg  qd  5. HLD - 02/08/2016: Cholesterol 232; HDL 48; LDL Cholesterol 166; Triglycerides 91; VLDL 18  - Hx of mayagis on statin. SOB on Zetia. Consider lipid clinic referral.     Medication Adjustments/Labs and Tests Ordered: Current medicines are reviewed at length with the patient today.  Concerns regarding medicines are outlined above.  Medication changes, Labs and Tests ordered today are listed in the Patient Instructions below. Patient Instructions  Medication Instructions:  Please restart Plavix 75 mg a day, ASA 81 mg a day and as needed Nitroglycerin. Continue all other medications as listed.  Follow-Up: Follow up in 4 to 6 months with Dr. Burt Knack.  You will receive a letter in the mail 2 months before you are due.  Please call us when you receive this letter to schedule your follow up appointment.  If you need a refill on your cardiac medications before your next appointment, please call your pharmacy.  Thank you for choosing [facility]!!        Jarrett Soho, [place]    11/30/2016 4:07 PM    [facility] [address], [place], [place]  [address] Phone: [phone]; Fax: [phone]

## 2016-12-05 ENCOUNTER — Encounter: Payer: Self-pay | Admitting: *Deleted

## 2016-12-05 NOTE — Progress Notes (Unsigned)
Late entry:  Subject met inclusion and exclusion criteria. The informed consent form, study requirements and expectations were reviewed with the subject and questions and concerns were addressed prior to the signing of the consent form. The subject verbalized understanding of the trail requirements. The subject agreed to participate in the CLEARtrial and signed the informed consent. The informed consent was obtained prior to performance of any protocol-specific procedures for the subject. A copy of the signed informed consent was given to the subject and a copy was placed in the subject's medical record.  Jake Bathe, RN 07/13/16 [id number]

## 2016-12-14 ENCOUNTER — Other Ambulatory Visit: Payer: Self-pay | Admitting: Internal Medicine

## 2016-12-20 ENCOUNTER — Other Ambulatory Visit: Payer: Self-pay | Admitting: Cardiovascular Disease

## 2016-12-20 NOTE — Telephone Encounter (Signed)
Okay to refill? Please advise. Thanks, MI 

## 2016-12-21 ENCOUNTER — Other Ambulatory Visit: Payer: Self-pay

## 2016-12-21 MED ORDER — FINASTERIDE 5 MG PO TABS: 5.0000 mg | ORAL_TABLET | Freq: Every day | ORAL | 3 refills | Status: DC

## 2016-12-21 MED ORDER — CLOPIDOGREL BISULFATE 75 MG PO TABS: ORAL_TABLET | ORAL | 3 refills | Status: DC

## 2016-12-21 NOTE — Addendum Note (Signed)
Addended by: Derl Barrow on: 12/21/2016 03:29 PM   Modules accepted: Orders

## 2016-12-21 NOTE — Telephone Encounter (Signed)
Please forward to PCP to address Rx since this is being used for prostate enlargement.  The pt is also overdue for follow-up with PCP after hospitalization.

## 2016-12-21 NOTE — Telephone Encounter (Signed)
Pt's medication was resent, because medication was sent to wrong pharmacy. Medication sent to right pharmacy as requested by the pt. Confirmation received.

## 2016-12-30 DIAGNOSIS — Z0100 Encounter for examination of eyes and vision without abnormal findings: Secondary | ICD-10-CM | POA: Diagnosis not present

## 2016-12-30 DIAGNOSIS — H524 Presbyopia: Secondary | ICD-10-CM | POA: Diagnosis not present

## 2017-01-29 DIAGNOSIS — Z08 Encounter for follow-up examination after completed treatment for malignant neoplasm: Secondary | ICD-10-CM | POA: Diagnosis not present

## 2017-01-29 DIAGNOSIS — Z8572 Personal history of non-Hodgkin lymphomas: Secondary | ICD-10-CM | POA: Diagnosis not present

## 2017-03-13 DIAGNOSIS — M25561 Pain in right knee: Secondary | ICD-10-CM | POA: Diagnosis not present

## 2017-05-10 ENCOUNTER — Other Ambulatory Visit: Payer: Self-pay | Admitting: *Deleted

## 2017-05-10 MED ORDER — AMLODIPINE BESYLATE 10 MG PO TABS: 10.0000 mg | ORAL_TABLET | Freq: Every day | ORAL | 0 refills | Status: DC

## 2017-05-14 ENCOUNTER — Ambulatory Visit: Payer: Medicare HMO | Admitting: Cardiovascular Disease

## 2017-05-22 DIAGNOSIS — H16223 Keratoconjunctivitis sicca, not specified as Sjogren's, bilateral: Secondary | ICD-10-CM | POA: Diagnosis not present

## 2017-05-22 DIAGNOSIS — H401132 Primary open-angle glaucoma, bilateral, moderate stage: Secondary | ICD-10-CM | POA: Diagnosis not present

## 2017-05-25 ENCOUNTER — Encounter: Payer: Self-pay | Admitting: Cardiovascular Disease

## 2017-06-07 ENCOUNTER — Ambulatory Visit: Payer: Medicare HMO | Admitting: Cardiovascular Disease

## 2017-06-13 NOTE — Progress Notes (Signed)
Subjective:    Patient ID: Austin [name], male    DOB: 01-Jan-1946, 71 y.o.   MRN: [id number]  HPI He is here for a physical exam.   He has no concerns. He denies any changes since he was here last.  Medications and allergies reviewed with patient and updated if appropriate.  Patient Active Problem List   Diagnosis Date Noted  . Syncope   . Bradycardia   . Syncope and collapse 11/11/2016  . Intractable hiccups 09/08/2016  . CAP (community acquired pneumonia) 08/28/2016  . AKI (acute kidney injury) ([place]) 08/28/2016  . Hyperglycemia 08/28/2016  . Pneumonia 08/28/2016  . Pleural effusion 08/28/2016  . Ischemic chest pain 03/13/2016  . Coronary artery disease involving native coronary artery with angina pectoris ([place]) 12/13/2010  . Hyperlipidemia 11/12/2010  . Allergic rhinitis 11/12/2010  . Anemia 12/28/2009  . THROMBOCYTOPENIA 12/28/2009  . 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 Hematuria 06/22/2009  . Essential hypertension 11/26/2008  . NEPHROLITHIASIS, HX OF 06/26/2007  . 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 amLODipine (NORVASC) 10 MG tablet Take 1 tablet (10 mg total) by mouth daily. 90 tablet 0  . aspirin 81 MG tablet Take 1 tablet (81 mg total) by mouth daily. 30 tablet   . clopidogrel (PLAVIX) 75 MG tablet TAKE 1 TABLET(75 MG) BY MOUTH DAILY 90 tablet 3  . finasteride (PROSCAR) 5 MG tablet Take 1 tablet (5 mg total) by mouth daily. 90 tablet 3  . nitroGLYCERIN (NITROSTAT) 0.4 MG SL tablet PLACE AND DISSOLVE 1 TABLET UNDER THE TONGUE EVERY 5 MINUTES AS NEEDED FOR CHEST PAIN 75 tablet 3  . timolol (BETIMOL) 0.25 % ophthalmic solution Place 1 drop into both eyes 2 (two) times daily.      No current facility-administered medications on file prior to visit.     Past Medical History:  Diagnosis Date  . Allergy   . CAP (community acquired  pneumonia)   . Coronary artery disease    03/13/2016 DES to first OM, EF 55%  . Heart murmur    "outgrew it" (03/13/2016)  . Hematuria   . HTN (hypertension)   . Hx of cardiovascular stress test    ETT-Myoview (10/15): Nondiagnostic EKG changes, anterolateral and inferolateral scar, no ischemia, EF 48%, low risk  . Hyperlipemia   . Hyperlipidemia    NMR Lipoprofile 2008: LDL 168 ( 2410/ 1826), HDL 41, TG 71. LDL goal = <100, ideally <  70. Framingham Study LDL goal = < 130.  Marland Kitchen Kidney stones    "passed them"; Dr. Terance Hart (03/13/2016)  . Myocardial infarction ([place]) 2001  . Non Hodgkin's lymphoma ([place])    [facility]  . Pleural effusion   . TIA (transient ischemic attack) 1980s   "I was exercising when I had it"    Past Surgical History:  Procedure Laterality Date  . CARDIAC CATHETERIZATION N/A 12/22/2015   Procedure: Left Heart Cath and Coronary Angiography;  Surgeon: Sherren Mocha, MD;  Location: [place] [facility];  Service: Cardiovascular;  Laterality: N/A;  . CARDIAC CATHETERIZATION N/A 12/22/2015   Procedure: Coronary Stent Intervention;  Surgeon: Sherren Mocha, MD;  Location: [place] [facility];  Service: Cardiovascular;  Laterality: N/A;  . CARDIAC CATHETERIZATION N/A 12/22/2015   Procedure: Intravascular Pressure Wire/FFR Study;  Surgeon: Sherren Mocha, MD;  Location: [place] [facility];  Service: Cardiovascular;  Laterality: N/A;  . CARDIAC CATHETERIZATION N/A 03/13/2016   Procedure: Left Heart Cath and Coronary Angiography;  Surgeon: Sherren Mocha, MD;  Location: [place] [facility];  Service: Cardiovascular;  Laterality: N/A;  . COLONOSCOPY  2002  . CORONARY ANGIOPLASTY WITH STENT PLACEMENT  ~ 2013   "1 stent"  . ESOPHAGOGASTRODUODENOSCOPY (EGD) WITH ESOPHAGEAL DILATION  2002  . KNEE ARTHROSCOPY Left 1990s    Social History   Social History  . Marital status: Married    Spouse name: N/A  . Number of children: N/A  . Years of education: N/A   Social History  Main Topics  . Smoking status: Never Smoker  . Smokeless tobacco: Never Used  . Alcohol use Yes     Comment: drank some in my 20's"  . Drug use: No  . Sexual activity: Yes   Other Topics Concern  . None   Social History Narrative  . None    Family History  Problem Relation Age of Onset  . Cancer Mother        lung cancer  . Diabetes Mother   . Hyperlipidemia Mother   . Stroke Father        age 69  . Cancer Sister        ?lymphoma    Review of Systems  Constitutional: Negative for chills and fever.  Eyes: Negative for visual disturbance.  Respiratory: Negative for cough, shortness of breath and wheezing.   Cardiovascular: Negative for chest pain, palpitations and leg swelling.  Gastrointestinal: Negative for abdominal pain, blood in stool, constipation, diarrhea and nausea.  Endocrine: Positive for cold intolerance.  Genitourinary: Positive for frequency (nocutria 2-3). Negative for dysuria and hematuria.  Musculoskeletal: Negative for arthralgias and back pain.  Skin: Negative for color change and rash.  Neurological: Negative for light-headedness and headaches.  Psychiatric/Behavioral: Negative for dysphoric mood. The patient is not nervous/anxious.        Objective:   Vitals:   06/14/17 0911  BP: 138/70  Pulse: 69  Resp: 16  Temp: 97.8 F (36.6 C)  SpO2: 98%   Filed Weights   06/14/17 0911  Weight: 203 lb (92.1 kg)   Body mass index is 26.78 kg/m.  Wt Readings from Last 3 Encounters:  06/14/17 203 lb (92.1 kg)  11/30/16 202 lb 6.4 oz (91.8 kg)  11/08/16 202 lb 1.9 oz (91.7 kg)     Physical Exam Constitutional: He appears well-developed and well-nourished. No distress.  HENT:  Head: Normocephalic and atraumatic.  Right Ear: External ear normal.  Left Ear: External ear normal.  Mouth/Throat: Oropharynx is clear and moist.  Normal ear canals and TM b/l  Eyes: Conjunctivae and EOM are normal.  Neck: Neck supple. No tracheal deviation present.  No thyromegaly present.  No carotid bruit  Cardiovascular: Normal rate, regular rhythm, normal heart sounds and intact distal pulses.   No murmur heard. Pulmonary/Chest: Effort normal and breath sounds normal. No respiratory distress. He has no wheezes. He has no rales.  Abdominal: Soft. He exhibits no distension. There is no tenderness.  Genitourinary: deferred  Musculoskeletal: He exhibits no edema.  Lymphadenopathy:   He has no cervical adenopathy.  Skin: Skin is warm and dry. He is not diaphoretic.  Psychiatric: He has a normal mood and affect. His behavior is normal.         Assessment & Plan:   Physical exam: Screening blood work ordered Immunizations  Discussed recommended vaccines Colonoscopy - has not had one in years -  will refer today Eye exams  Up to date  EKG  Last done 10/2016 Exercise   Goes to gym regularly - weights, cardio Weight   Good for age Skin    No concerns Substance abuse   none  See Problem List for Assessment and Plan of chronic medical problems.  FU annually

## 2017-06-13 NOTE — Patient Instructions (Addendum)
Test(s) ordered today. Your results will be released to MyChart (or called to you) after review, usually within 72hours after test completion. If any changes need to be made, you will be notified at that same time.  All other Health Maintenance issues reviewed.   All recommended immunizations and age-appropriate screenings are up-to-date or discussed.  No immunizations administered today.   Medications reviewed and updated.  No changes recommended at this time.   Please followup in one year    Health Maintenance, Male A healthy lifestyle and preventive care is important for your health and wellness. Ask your health care provider about what schedule of regular examinations is right for you. What should I know about weight and diet? Eat a Healthy Diet  Eat plenty of vegetables, fruits, whole grains, low-fat dairy products, and lean protein.  Do not eat a lot of foods high in solid fats, added sugars, or salt.  Maintain a Healthy Weight Regular exercise can help you achieve or maintain a healthy weight. You should:  Do at least 150 minutes of exercise each week. The exercise should increase your heart rate and make you sweat (moderate-intensity exercise).  Do strength-training exercises at least twice a week.  Watch Your Levels of Cholesterol and Blood Lipids  Have your blood tested for lipids and cholesterol every 5 years starting at 72 years of age. If you are at high risk for heart disease, you should start having your blood tested when you are 72 years old. You may need to have your cholesterol levels checked more often if: ? Your lipid or cholesterol levels are high. ? You are older than 72 years of age. ? You are at high risk for heart disease.  What should I know about cancer screening? Many types of cancers can be detected early and may often be prevented. Lung Cancer  You should be screened every year for lung cancer if: ? You are a current smoker who has smoked for  at least 30 years. ? You are a former smoker who has quit within the past 15 years.  Talk to your health care provider about your screening options, when you should start screening, and how often you should be screened.  Colorectal Cancer  Routine colorectal cancer screening usually begins at 72 years of age and should be repeated every 5-10 years until you are 72 years old. You may need to be screened more often if early forms of precancerous polyps or small growths are found. Your health care provider may recommend screening at an earlier age if you have risk factors for colon cancer.  Your health care provider may recommend using home test kits to check for hidden blood in the stool.  A small camera at the end of a tube can be used to examine your colon (sigmoidoscopy or colonoscopy). This checks for the earliest forms of colorectal cancer.  Prostate and Testicular Cancer  Depending on your age and overall health, your health care provider may do certain tests to screen for prostate and testicular cancer.  Talk to your health care provider about any symptoms or concerns you have about testicular or prostate cancer.  Skin Cancer  Check your skin from head to toe regularly.  Tell your health care provider about any new moles or changes in moles, especially if: ? There is a change in a mole's size, shape, or color. ? You have a mole that is larger than a pencil eraser.  Always use sunscreen. Apply sunscreen liberally   and repeat throughout the day.  Protect yourself by wearing long sleeves, pants, a wide-brimmed hat, and sunglasses when outside.  What should I know about heart disease, diabetes, and high blood pressure?  If you are 18-39 years of age, have your blood pressure checked every 3-5 years. If you are 40 years of age or older, have your blood pressure checked every year. You should have your blood pressure measured twice-once when you are at a hospital or clinic, and once  when you are not at a hospital or clinic. Record the average of the two measurements. To check your blood pressure when you are not at a hospital or clinic, you can use: ? An automated blood pressure machine at a pharmacy. ? A home blood pressure monitor.  Talk to your health care provider about your target blood pressure.  If you are between 45-79 years old, ask your health care provider if you should take aspirin to prevent heart disease.  Have regular diabetes screenings by checking your fasting blood sugar level. ? If you are at a normal weight and have a low risk for diabetes, have this test once every three years after the age of 45. ? If you are overweight and have a high risk for diabetes, consider being tested at a younger age or more often.  A one-time screening for abdominal aortic aneurysm (AAA) by ultrasound is recommended for men aged 65-75 years who are current or former smokers. What should I know about preventing infection? Hepatitis B If you have a higher risk for hepatitis B, you should be screened for this virus. Talk with your health care provider to find out if you are at risk for hepatitis B infection. Hepatitis C Blood testing is recommended for:  Everyone born from 1945 through 1965.  Anyone with known risk factors for hepatitis C.  Sexually Transmitted Diseases (STDs)  You should be screened each year for STDs including gonorrhea and chlamydia if: ? You are sexually active and are younger than 72 years of age. ? You are older than 72 years of age and your health care provider tells you that you are at risk for this type of infection. ? Your sexual activity has changed since you were last screened and you are at an increased risk for chlamydia or gonorrhea. Ask your health care provider if you are at risk.  Talk with your health care provider about whether you are at high risk of being infected with HIV. Your health care provider may recommend a prescription  medicine to help prevent HIV infection.  What else can I do?  Schedule regular health, dental, and eye exams.  Stay current with your vaccines (immunizations).  Do not use any tobacco products, such as cigarettes, chewing tobacco, and e-cigarettes. If you need help quitting, ask your health care provider.  Limit alcohol intake to no more than 2 drinks per day. One drink equals 12 ounces of beer, 5 ounces of wine, or 1 ounces of hard liquor.  Do not use street drugs.  Do not share needles.  Ask your health care provider for help if you need support or information about quitting drugs.  Tell your health care provider if you often feel depressed.  Tell your health care provider if you have ever been abused or do not feel safe at home. This information is not intended to replace advice given to you by your health care provider. Make sure you discuss any questions you have with your health   care provider. Document Released: 02/10/2008 Document Revised: 04/12/2016 Document Reviewed: 05/18/2015 Elsevier Interactive Patient Education  2018 Elsevier Inc.  

## 2017-06-14 ENCOUNTER — Encounter: Payer: Self-pay | Admitting: Internal Medicine

## 2017-06-14 ENCOUNTER — Ambulatory Visit (INDEPENDENT_AMBULATORY_CARE_PROVIDER_SITE_OTHER): Payer: Medicare HMO | Admitting: Internal Medicine

## 2017-06-14 ENCOUNTER — Other Ambulatory Visit (INDEPENDENT_AMBULATORY_CARE_PROVIDER_SITE_OTHER): Payer: Medicare HMO

## 2017-06-14 VITALS — BP 138/70 | HR 69 | Temp 97.8°F | Resp 16 | Ht 73.0 in | Wt 203.0 lb

## 2017-06-14 DIAGNOSIS — C8290 Follicular lymphoma, unspecified, unspecified site: Secondary | ICD-10-CM | POA: Diagnosis not present

## 2017-06-14 DIAGNOSIS — R739 Hyperglycemia, unspecified: Secondary | ICD-10-CM | POA: Diagnosis not present

## 2017-06-14 DIAGNOSIS — E785 Hyperlipidemia, unspecified: Secondary | ICD-10-CM | POA: Diagnosis not present

## 2017-06-14 DIAGNOSIS — Z1159 Encounter for screening for other viral diseases: Secondary | ICD-10-CM

## 2017-06-14 DIAGNOSIS — I10 Essential (primary) hypertension: Secondary | ICD-10-CM

## 2017-06-14 DIAGNOSIS — I25119 Atherosclerotic heart disease of native coronary artery with unspecified angina pectoris: Secondary | ICD-10-CM | POA: Diagnosis not present

## 2017-06-14 DIAGNOSIS — Z0000 Encounter for general adult medical examination without abnormal findings: Secondary | ICD-10-CM

## 2017-06-14 DIAGNOSIS — Z1211 Encounter for screening for malignant neoplasm of colon: Secondary | ICD-10-CM

## 2017-06-14 LAB — CBC WITH DIFFERENTIAL/PLATELET
BASOS PCT: 0.9 % (ref 0.0–3.0)
Basophils Absolute: 0 10*3/uL (ref 0.0–0.1)
EOS ABS: 0.1 10*3/uL (ref 0.0–0.7)
Eosinophils Relative: 1.6 % (ref 0.0–5.0)
HEMATOCRIT: 40.2 % (ref 39.0–52.0)
HEMOGLOBIN: 13.6 g/dL (ref 13.0–17.0)
LYMPHS PCT: 29.4 % (ref 12.0–46.0)
Lymphs Abs: 1 10*3/uL (ref 0.7–4.0)
MCHC: 33.7 g/dL (ref 30.0–36.0)
MCV: 89.5 fl (ref 78.0–100.0)
MONO ABS: 0.5 10*3/uL (ref 0.1–1.0)
Monocytes Relative: 12.8 % — ABNORMAL HIGH (ref 3.0–12.0)
Neutro Abs: 2 10*3/uL (ref 1.4–7.7)
Neutrophils Relative %: 55.3 % (ref 43.0–77.0)
Platelets: 256 10*3/uL (ref 150.0–400.0)
RBC: 4.5 Mil/uL (ref 4.22–5.81)
RDW: 12.9 % (ref 11.5–15.5)
WBC: 3.6 10*3/uL — AB (ref 4.0–10.5)

## 2017-06-14 LAB — COMPREHENSIVE METABOLIC PANEL
ALBUMIN: 4.4 g/dL (ref 3.5–5.2)
ALT: 13 U/L (ref 0–53)
AST: 20 U/L (ref 0–37)
Alkaline Phosphatase: 84 U/L (ref 39–117)
BUN: 16 mg/dL (ref 6–23)
CHLORIDE: 103 meq/L (ref 96–112)
CO2: 31 meq/L (ref 19–32)
CREATININE: 1.07 mg/dL (ref 0.40–1.50)
Calcium: 10.2 mg/dL (ref 8.4–10.5)
GFR: 87.24 mL/min (ref >60.00)
Glucose, Bld: 62 mg/dL — ABNORMAL LOW (ref 70–99)
Potassium: 4.2 mEq/L (ref 3.5–5.1)
SODIUM: 140 meq/L (ref 135–145)
Total Bilirubin: 0.6 mg/dL (ref 0.2–1.2)
Total Protein: 7.4 g/dL (ref 6.0–8.3)

## 2017-06-14 LAB — LIPID PANEL
CHOL/HDL RATIO: 7
Cholesterol: 286 mg/dL — ABNORMAL HIGH (ref 0–200)
HDL: 43.1 mg/dL (ref >39.00)
LDL CALC: 207 mg/dL — AB (ref 0–99)
NONHDL: 242.72
Triglycerides: 180 mg/dL — ABNORMAL HIGH (ref 0.0–149.0)
VLDL: 36 mg/dL (ref 0.0–40.0)

## 2017-06-14 LAB — TSH: TSH: 2.43 u[IU]/mL (ref 0.35–4.50)

## 2017-06-14 LAB — HEMOGLOBIN A1C: HEMOGLOBIN A1C: 6 % (ref 4.6–6.5)

## 2017-06-14 NOTE — Assessment & Plan Note (Signed)
S/p stent On aspirin and Plavix Unable to tolerate statins No concerning symptoms Continue regular exercise Following with cardiology

## 2017-06-14 NOTE — Assessment & Plan Note (Signed)
In remission Follows with oncology twice a year

## 2017-06-14 NOTE — Assessment & Plan Note (Signed)
BP well controlled Current regimen effective and well tolerated Continue current medications at current doses cmp  

## 2017-06-14 NOTE — Assessment & Plan Note (Signed)
Intolerant to statins Check lipid panel Continue regular exercise and healthy diet

## 2017-06-14 NOTE — Assessment & Plan Note (Signed)
Will check A1c

## 2017-06-15 ENCOUNTER — Encounter: Payer: Self-pay | Admitting: Internal Medicine

## 2017-06-15 LAB — HEPATITIS C ANTIBODY
HEP C AB: NONREACTIVE
SIGNAL TO CUT-OFF: 0.02 (ref <1.00)

## 2017-07-04 ENCOUNTER — Encounter: Payer: Self-pay | Admitting: Internal Medicine

## 2017-07-13 ENCOUNTER — Other Ambulatory Visit: Payer: Self-pay | Admitting: Physician Assistant

## 2017-07-17 ENCOUNTER — Other Ambulatory Visit: Payer: Self-pay | Admitting: Cardiovascular Disease

## 2017-08-01 ENCOUNTER — Encounter: Payer: Self-pay | Admitting: Cardiovascular Disease

## 2017-08-01 ENCOUNTER — Ambulatory Visit (INDEPENDENT_AMBULATORY_CARE_PROVIDER_SITE_OTHER): Payer: Medicare HMO | Admitting: Cardiovascular Disease

## 2017-08-01 VITALS — BP 122/72 | HR 61 | Ht 73.0 in | Wt 201.8 lb

## 2017-08-01 DIAGNOSIS — I25119 Atherosclerotic heart disease of native coronary artery with unspecified angina pectoris: Secondary | ICD-10-CM

## 2017-08-01 DIAGNOSIS — I10 Essential (primary) hypertension: Secondary | ICD-10-CM

## 2017-08-01 DIAGNOSIS — E785 Hyperlipidemia, unspecified: Secondary | ICD-10-CM | POA: Diagnosis not present

## 2017-08-01 NOTE — Patient Instructions (Signed)
Medication Instructions:  Your provider recommends that you continue on your current medications as directed. Please refer to the Current Medication list given to you today.    Labwork: None  Testing/Procedures: None  Follow-Up: You have been referred to LIPID CLINIC.  Your provider wants you to follow-up in: 1 year with Dr. Burt Knack. You will receive a reminder letter in the mail two months in advance. If you don't receive a letter, please call our office to schedule the follow-up appointment.    Any Other Special Instructions Will Be Listed Below (If Applicable).     If you need a refill on your cardiac medications before your next appointment, please call your pharmacy.

## 2017-08-01 NOTE — Progress Notes (Signed)
Cardiology Office Note Date:  08/01/2017   ID:  Austin [name], DOB [birth year], MRN [id number]  PCP:  Binnie Rail, MD  Cardiologist:  Sherren Mocha, MD    Chief Complaint  Patient presents with  . Follow-up    cad     History of Present Illness: Austin [name] is a 71 y.o. male who presents for follow-up of CAD. He's had recurrent angina and multiple PCI procedures in the past, last in June 2017.  The patient is here with his wife today.  He is doing well from a cardiac perspective.  No recent anginal symptoms.  Specifically denies chest pain or pressure, shortness of breath, heart palpitations, lightheadedness, or syncope.  He still exercises on a treadmill without exertional symptoms.  He has been compliant with his medications.  Denies any bleeding or bruising problems on dual antiplatelet therapy with aspirin and clopidogrel.  Past Medical History:  Diagnosis Date  . Allergy   . CAP (community acquired pneumonia)   . Coronary artery disease    03/13/2016 DES to first OM, EF 55%  . Heart murmur    "outgrew it" (03/13/2016)  . Hematuria   . HTN (hypertension)   . Hx of cardiovascular stress test    ETT-Myoview (10/15): Nondiagnostic EKG changes, anterolateral and inferolateral scar, no ischemia, EF 48%, low risk  . Hyperlipemia   . Hyperlipidemia    NMR Lipoprofile 2008: LDL 168 ( 2410/ 1826), HDL 41, TG 71. LDL goal = <100, ideally <  70. Framingham Study LDL goal = < 130.  Marland Kitchen Kidney stones    "passed them"; Dr. Terance Hart (03/13/2016)  . Myocardial infarction ([place]) 2001  . [facility]  . Pleural effusion   . TIA (transient ischemic attack) 1980s   "I was exercising when I had it"    Past Surgical History:  Procedure Laterality Date  . CARDIAC CATHETERIZATION N/A 12/22/2015   Procedure: Left Heart Cath and Coronary Angiography;  Surgeon: Sherren Mocha, MD;  Location: [facility];  Service: Cardiovascular;   Laterality: N/A;  . CARDIAC CATHETERIZATION N/A 12/22/2015   Procedure: Coronary Stent Intervention;  Surgeon: Sherren Mocha, MD;  Location: [facility];  Service: Cardiovascular;  Laterality: N/A;  . CARDIAC CATHETERIZATION N/A 12/22/2015   Procedure: Intravascular Pressure Wire/FFR Study;  Surgeon: Sherren Mocha, MD;  Location: [facility];  Service: Cardiovascular;  Laterality: N/A;  . CARDIAC CATHETERIZATION N/A 03/13/2016   Procedure: Left Heart Cath and Coronary Angiography;  Surgeon: Sherren Mocha, MD;  Location: [facility];  Service: Cardiovascular;  Laterality: N/A;  . COLONOSCOPY  2002  . CORONARY ANGIOPLASTY WITH STENT PLACEMENT  ~ 2013   "1 stent"  . ESOPHAGOGASTRODUODENOSCOPY (EGD) WITH ESOPHAGEAL DILATION  2002  . KNEE ARTHROSCOPY Left 1990s    Current Outpatient Medications  Medication Sig Dispense Refill  . amLODipine (NORVASC) 10 MG tablet TAKE 1 TABLET (10 MG TOTAL) BY MOUTH DAILY. 90 tablet 1  . aspirin 81 MG tablet Take 1 tablet (81 mg total) by mouth daily. 30 tablet   . clopidogrel (PLAVIX) 75 MG tablet TAKE 1 TABLET(75 MG) BY MOUTH DAILY 90 tablet 3  . finasteride (PROSCAR) 5 MG tablet Take 1 tablet (5 mg total) by mouth daily. 90 tablet 3  . nitroGLYCERIN (NITROSTAT) 0.4 MG SL tablet PLACE AND DISSOLVE 1 TABLET UNDER THE TONGUE EVERY 5 MINUTES AS NEEDED FOR CHEST PAIN 25 tablet 3  .  timolol (BETIMOL) 0.25 % ophthalmic solution Place 1 drop into both eyes 2 (two) times daily.      No current facility-administered medications for this visit.     Allergies:   Zetia [ezetimibe]; Crestor [rosuvastatin]; Isosorbide mononitrate [isosorbide nitrate]; Pravachol [pravastatin sodium]; and Silver   Social History:  The patient  reports that  has never smoked. he has never used smokeless tobacco. He reports that he does not drink alcohol or use drugs.   Family History:  The patient's family history includes Cancer in his mother and sister; Diabetes in  his mother; Hyperlipidemia in his mother; Stroke in his father.    ROS:  Please see the history of present illness.  Otherwise, review of systems is positive for abdominal pain, back pain, leg pain, joint swelling.  All other systems are reviewed and negative.    PHYSICAL EXAM: VS:  BP 122/72   Pulse 61   Ht 6\' 1"  (1.854 m)   Wt 201 lb 12.8 oz (91.5 kg)   SpO2 99%   BMI 26.62 kg/m  , BMI Body mass index is 26.62 kg/m. GEN: Well nourished, well developed, in no acute distress  HEENT: normal  Neck: no JVD, no masses. No carotid bruits Cardiac: RRR without murmur or gallop     Respiratory:  clear to auscultation bilaterally, normal work of breathing GI: soft, nontender, nondistended, + BS MS: no deformity or atrophy  Ext: no pretibial edema, pedal pulses 2+= bilaterally Skin: warm and dry, no rash Neuro:  Strength and sensation are intact Psych: euthymic mood, full affect  EKG:  EKG is ordered today. The ekg ordered today shows normal sinus rhythm 61 bpm, age-indeterminate inferolateral infarct, no significant change from previous.  Recent Labs: 11/12/2016: Magnesium 2.0 06/14/2017: ALT 13; BUN 16; Creatinine, Ser 1.07; Hemoglobin 13.6; Platelets 256.0; Potassium 4.2; Sodium 140; TSH 2.43   Lipid Panel     Component Value Date/Time   CHOL 286 (H) 06/14/2017 1003   TRIG 180.0 (H) 06/14/2017 1003   HDL 43.10 06/14/2017 1003   CHOLHDL 7 06/14/2017 1003   VLDL 36.0 06/14/2017 1003   LDLCALC 207 (H) 06/14/2017 1003   LDLDIRECT 173.2 07/21/2013 0809      Wt Readings from Last 3 Encounters:  08/01/17 201 lb 12.8 oz (91.5 kg)  06/14/17 203 lb (92.1 kg)  11/30/16 202 lb 6.4 oz (91.8 kg)     ASSESSMENT AND PLAN: 1.  Coronary artery disease, native vessel, with angina: The patient's symptoms are stable and minimal at present.  He continues on dual antiplatelet therapy which I would favor continuing long-term and less he has a change in his bleeding risk.  He has had recurrent  ischemic events over the years.  Angina seems to be well controlled on amlodipine.  I would like to see him back in 1 year.  2.  Hyperlipidemia: The patient is statin intolerant.  His lipids are uncontrolled with an LDL cholesterol greater than 200.  I would like him to consider a PCSK9 inhibitor and we discussed this today.  He has explored this through the research group in the past.  Will refer to the lipid clinic.  3.  Hypertension: Blood pressure is well controlled.  Current medicines are reviewed with the patient today.  The patient does not have concerns regarding medicines.  Labs/ tests ordered today include:   Orders Placed This Encounter  Procedures  . EKG 12-Lead    Disposition:   FU one year  Signed, Sherren Mocha,  MD  08/01/2017 3:57 PM    [address], [place], [place]  [address] Phone: [phone]; Fax: [phone]

## 2017-08-02 ENCOUNTER — Telehealth: Payer: Self-pay | Admitting: *Deleted

## 2017-08-02 NOTE — Telephone Encounter (Signed)
Called patient to make an appointment with the [facility] or Dr. Hilarie Fredrickson, as he is on Plavix and was scheduled for a direct colonoscopy.  There was no answer so I left a message on VM for him to call the office to schedule office appointment.  Advised we would keep appointments scheduled for colonoscopy for 09/04/17 as long as he is seen in office prior.   B.Ridgely Anastacio, CMA  [facility]

## 2017-08-15 ENCOUNTER — Ambulatory Visit: Payer: Medicare HMO | Admitting: Physician Assistant

## 2017-08-15 ENCOUNTER — Telehealth: Payer: Self-pay | Admitting: Emergency Medicine

## 2017-08-15 ENCOUNTER — Telehealth: Payer: Self-pay | Admitting: Cardiovascular Disease

## 2017-08-15 ENCOUNTER — Encounter: Payer: Self-pay | Admitting: Physician Assistant

## 2017-08-15 VITALS — BP 128/72 | HR 61 | Ht 73.0 in | Wt 203.0 lb

## 2017-08-15 DIAGNOSIS — Z1211 Encounter for screening for malignant neoplasm of colon: Secondary | ICD-10-CM | POA: Diagnosis not present

## 2017-08-15 DIAGNOSIS — Z7901 Long term (current) use of anticoagulants: Secondary | ICD-10-CM

## 2017-08-15 DIAGNOSIS — Z01818 Encounter for other preprocedural examination: Secondary | ICD-10-CM

## 2017-08-15 MED ORDER — NA SULFATE-K SULFATE-MG SULF 17.5-3.13-1.6 GM/177ML PO SOLN: 1.0000 | ORAL | 0 refills | Status: DC

## 2017-08-15 NOTE — Progress Notes (Addendum)
Chief Complaint: Consult for a screening colonoscopy on chronic anticoagulation  HPI:    Austin [name] is a 71 year old African-American male with a past medical history of CAD status post stent placement in July 2017 maintained on Plavix, who was referred to me by Binnie Rail, MD for a consult of a screening colonoscopy in a patient on chronic anticoagulation.      Today, the patient presents to clinic accompanied by his wife and tells me that his last colonoscopy was more than 10 years ago and he believes it was done on "[facility]".  Patient tells me he did not have any polyps but was told recently that he needed to come back for screening as it has been over 10 years since his last procedure.     Patient denies fever, chills, blood in stool, melena, weight loss, fatigue, anorexia, nausea, vomiting, heartburn, reflux, change in bowel habits or abdominal pain.  Past Medical History:  Diagnosis Date  . Allergy   . CAP (community acquired pneumonia)   . Coronary artery disease    03/13/2016 DES to first OM, EF 55%  . Heart murmur    "outgrew it" (03/13/2016)  . Hematuria   . HTN (hypertension)   . Hx of cardiovascular stress test    ETT-Myoview (10/15): Nondiagnostic EKG changes, anterolateral and inferolateral scar, no ischemia, EF 48%, low risk  . Hyperlipemia   . Hyperlipidemia    NMR Lipoprofile 2008: LDL 168 ( 2410/ 1826), HDL 41, TG 71. LDL goal = <100, ideally <  70. Framingham Study LDL goal = < 130.  Marland Kitchen Kidney stones    "passed them"; Dr. Terance Hart (03/13/2016)  . Myocardial infarction ([place]) 2001  . Non Hodgkin's lymphoma ([place])    [facility]  . Pleural effusion   . TIA (transient ischemic attack) 1980s   "I was exercising when I had it"    Past Surgical History:  Procedure Laterality Date  . CARDIAC CATHETERIZATION N/A 12/22/2015   Procedure: Left Heart Cath and Coronary Angiography;  Surgeon: Sherren Mocha, MD;  Location: [facility];  Service:  Cardiovascular;  Laterality: N/A;  . CARDIAC CATHETERIZATION N/A 12/22/2015   Procedure: Coronary Stent Intervention;  Surgeon: Sherren Mocha, MD;  Location: [facility];  Service: Cardiovascular;  Laterality: N/A;  . CARDIAC CATHETERIZATION N/A 12/22/2015   Procedure: Intravascular Pressure Wire/FFR Study;  Surgeon: Sherren Mocha, MD;  Location: [facility];  Service: Cardiovascular;  Laterality: N/A;  . CARDIAC CATHETERIZATION N/A 03/13/2016   Procedure: Left Heart Cath and Coronary Angiography;  Surgeon: Sherren Mocha, MD;  Location: [facility];  Service: Cardiovascular;  Laterality: N/A;  . COLONOSCOPY  2002  . CORONARY ANGIOPLASTY WITH STENT PLACEMENT  ~ 2013   "1 stent"  . ESOPHAGOGASTRODUODENOSCOPY (EGD) WITH ESOPHAGEAL DILATION  2002  . KNEE ARTHROSCOPY Left 1990s    Current Outpatient Medications  Medication Sig Dispense Refill  . amLODipine (NORVASC) 10 MG tablet TAKE 1 TABLET (10 MG TOTAL) BY MOUTH DAILY. 90 tablet 1  . aspirin 81 MG tablet Take 1 tablet (81 mg total) by mouth daily. 30 tablet   . clopidogrel (PLAVIX) 75 MG tablet TAKE 1 TABLET(75 MG) BY MOUTH DAILY 90 tablet 3  . finasteride (PROSCAR) 5 MG tablet Take 1 tablet (5 mg total) by mouth daily. 90 tablet 3  . nitroGLYCERIN (NITROSTAT) 0.4 MG SL tablet PLACE AND DISSOLVE 1 TABLET UNDER THE TONGUE EVERY 5 MINUTES AS NEEDED FOR CHEST PAIN  25 tablet 3  . timolol (BETIMOL) 0.25 % ophthalmic solution Place 1 drop into both eyes 2 (two) times daily.      No current facility-administered medications for this visit.     Allergies as of 08/15/2017 - Review Complete 08/15/2017  Allergen Reaction Noted  . Zetia [ezetimibe] Shortness Of Breath and Other (See Comments) 05/01/2013  . Crestor [rosuvastatin] Other (See Comments) 07/29/2013  . Isosorbide mononitrate [isosorbide nitrate] Other (See Comments) 03/01/2016  . Pravachol [pravastatin sodium] Other (See Comments) 07/29/2013  . Silver Rash 02/18/2016     Family History  Problem Relation Age of Onset  . Cancer Mother        lung cancer  . Diabetes Mother   . Hyperlipidemia Mother   . Stroke Father        age 73  . Cancer Sister        ?lymphoma    Social History   Socioeconomic History  . Marital status: Married    Spouse name: Not on file  . Number of children: Not on file  . Years of education: Not on file  . Highest education level: Not on file  Social Needs  . Financial resource strain: Not on file  . Food insecurity - worry: Not on file  . Food insecurity - inability: Not on file  . Transportation needs - medical: Not on file  . Transportation needs - non-medical: Not on file  Occupational History  . Not on file  Tobacco Use  . Smoking status: Never Smoker  . Smokeless tobacco: Never Used  Substance and Sexual Activity  . Alcohol use: No    Comment: drank some in my 20's"  . Drug use: No  . Sexual activity: Yes  Other Topics Concern  . Not on file  Social History Narrative  . Not on file    Review of Systems:    Constitutional: No weight loss, fever or chills Skin: No rash Cardiovascular: No chest pain  Respiratory: No SOB Gastrointestinal: See HPI and otherwise negative Genitourinary: No dysuria  Neurological: No headache, dizziness or syncope Musculoskeletal: No new muscle or joint pain Hematologic: No bleeding Psychiatric: No history of depression or anxiety   Physical Exam:  Vital signs: BP 128/72   Pulse 61   Ht 6\' 1"  (1.854 m)   Wt 203 lb (92.1 kg)   BMI 26.78 kg/m   Constitutional:   Pleasant AA male appears to be in NAD, Well developed, Well nourished, alert and cooperative Head:  Normocephalic and atraumatic. Eyes:   PEERL, EOMI. No icterus. Conjunctiva pink. Ears:  Normal auditory acuity. Neck:  Supple Throat: Oral cavity and pharynx without inflammation, swelling or lesion.  Respiratory: Respirations even and unlabored. Lungs clear to auscultation bilaterally.   No wheezes,  crackles, or rhonchi.  Cardiovascular: Normal S1, S2. No MRG. Regular rate and rhythm. No peripheral edema, cyanosis or pallor.  Gastrointestinal:  Soft, nondistended, nontender. No rebound or guarding. Normal bowel sounds. No appreciable masses or hepatomegaly. Rectal:  Not performed.  Msk:  Symmetrical without gross deformities. Without edema, no deformity or joint abnormality.  Neurologic:  Alert and  oriented x4;  grossly normal neurologically.  Skin:   Dry and intact without significant lesions or rashes. Psychiatric:  Demonstrates good judgement and reason without abnormal affect or behaviors.  MOST RECENT LABS AND IMAGING: CBC    Component Value Date/Time   WBC 3.6 (L) 06/14/2017 1003   RBC 4.50 06/14/2017 1003   HGB 13.6 06/14/2017 1003  HCT 40.2 06/14/2017 1003   PLT 256.0 06/14/2017 1003   MCV 89.5 06/14/2017 1003   MCH 29.3 11/14/2016 0625   MCHC 33.7 06/14/2017 1003   RDW 12.9 06/14/2017 1003   LYMPHSABS 1.0 06/14/2017 1003   MONOABS 0.5 06/14/2017 1003   EOSABS 0.1 06/14/2017 1003   BASOSABS 0.0 06/14/2017 1003    CMP     Component Value Date/Time   NA 140 06/14/2017 1003   K 4.2 06/14/2017 1003   CL 103 06/14/2017 1003   CO2 31 06/14/2017 1003   GLUCOSE 62 (L) 06/14/2017 1003   BUN 16 06/14/2017 1003   CREATININE 1.07 06/14/2017 1003   CREATININE 1.08 03/09/2016 1631   CALCIUM 10.2 06/14/2017 1003   PROT 7.4 06/14/2017 1003   ALBUMIN 4.4 06/14/2017 1003   AST 20 06/14/2017 1003   ALT 13 06/14/2017 1003   ALKPHOS 84 06/14/2017 1003   BILITOT 0.6 06/14/2017 1003   GFRNONAA 58 (L) 11/14/2016 0625   GFRAA >60 11/14/2016 0625    Assessment: 1.  Screening colonoscopy: Patient's last colonoscopy was more than 10 years ago per his report, patient is overdue 2.  Chronic anticoagulation: For CAD status post stent placement in July 2017, maintained on Plavix  Plan: 1.  Patient is already scheduled for a screening colonoscopy by Dr. Hilarie Fredrickson in the [facility] on  January 8 at 11:30 AM.  Reviewed risks, benefits, limitations and alternatives and the patient agrees to proceed. 2.  Patient was advised to hold his Plavix for 5 days prior to time of procedure.  We will communicate with his cardiologist to ensure that holding his Plavix is acceptable. 3.  Patient to follow in clinic per recommendations from Dr. Hilarie Fredrickson after time of procedure.  Ellouise Newer, PA-C [facility] 08/15/2017, 10:40 AM  Cc: Binnie Rail, MD   Addendum: Reviewed and agree with initial management. Pyrtle, Lajuan Lines, MD

## 2017-08-15 NOTE — Patient Instructions (Signed)

## 2017-08-15 NOTE — Telephone Encounter (Signed)
   [name] [birth year] [id number]    We have scheduled the above named patient for a colonoscopy procedure. Our records show that he is on anticoagulation therapy.  Please advise as to whether the patient may come off their therapy of Plavix 5 days prior to their procedure which is scheduled for 09-04-17.  Please route your response to Tinnie Gens, CMA or fax response to [phone].  Sincerely,    [facility]

## 2017-08-15 NOTE — Telephone Encounter (Signed)
° °  [facility] Pre-operative Risk Assessment    Request for surgical clearance:  1. What type of surgery is being performed? colonoscopy  2. When is this surgery scheduled? 09-04-16   3. Are there any medications that need to be held prior to surgery and how long? Plavix for 5 days prior   4. Practice name and name of physician performing surgery? Dr. Hilarie Fredrickson   5. What is your office phone and fax number? Office # [phone]  Fax [phone]  6. Anesthesia type (None, local, MAC, general) ? General    Austin [name] 08/15/2017, 4:26 PM  _________________________________________________________________   (provider comments below)

## 2017-08-16 NOTE — Telephone Encounter (Signed)
Correction note was not faxed waiting for Dr.Cooper's advice.

## 2017-08-16 NOTE — Telephone Encounter (Signed)
Dr Burt Knack are you OK with holding Plavix for colonoscopy?   Kerin Ransom PA-C 08/16/2017 4:47 PM

## 2017-08-16 NOTE — Telephone Encounter (Signed)
This pt has a complicated history. He has been stable and actually just saw Dr Burt Knack. I will check with Dr Burt Knack about stopping Plavix.  Kerin Ransom PA-C 08/16/2017 2:47 PM

## 2017-08-16 NOTE — Telephone Encounter (Signed)
Clearance note faxed to Dr.Pyrtle at fax # [phone].

## 2017-08-16 NOTE — Telephone Encounter (Signed)
Yes ok to hold plavix 5 days as requested. thanks

## 2017-08-17 NOTE — Telephone Encounter (Signed)
Spoke to patient and informed him to stop his Plavix 5 days before his procedure. He is aware that he will stop on 08-29-17 and continue after his procedure as directed at discharge by his provider. Patient agrees with treatment plan.

## 2017-08-17 NOTE — Telephone Encounter (Signed)
Message sent to Tinnie Gens at [facility]

## 2017-08-24 NOTE — Telephone Encounter (Signed)
This is fine. Please continue ASA 81 mg daily. Resume plavix when safe from GI perspective. thanks

## 2017-08-24 NOTE — Telephone Encounter (Signed)
Clearance faxed to Dr. Hilarie Fredrickson at [phone]

## 2017-08-27 DIAGNOSIS — C8290 Follicular lymphoma, unspecified, unspecified site: Secondary | ICD-10-CM | POA: Diagnosis not present

## 2017-08-29 ENCOUNTER — Encounter: Payer: Self-pay | Admitting: Internal Medicine

## 2017-09-04 ENCOUNTER — Other Ambulatory Visit: Payer: Self-pay

## 2017-09-04 ENCOUNTER — Ambulatory Visit (AMBULATORY_SURGERY_CENTER): Payer: Medicare HMO | Admitting: Internal Medicine

## 2017-09-04 ENCOUNTER — Encounter: Payer: Self-pay | Admitting: Internal Medicine

## 2017-09-04 VITALS — BP 135/80 | HR 52 | Temp 97.1°F | Resp 16 | Ht 73.0 in | Wt 203.0 lb

## 2017-09-04 DIAGNOSIS — D122 Benign neoplasm of ascending colon: Secondary | ICD-10-CM | POA: Diagnosis not present

## 2017-09-04 DIAGNOSIS — Z1211 Encounter for screening for malignant neoplasm of colon: Secondary | ICD-10-CM | POA: Diagnosis present

## 2017-09-04 DIAGNOSIS — D123 Benign neoplasm of transverse colon: Secondary | ICD-10-CM

## 2017-09-04 DIAGNOSIS — Z1212 Encounter for screening for malignant neoplasm of rectum: Secondary | ICD-10-CM | POA: Diagnosis not present

## 2017-09-04 DIAGNOSIS — K635 Polyp of colon: Secondary | ICD-10-CM | POA: Diagnosis not present

## 2017-09-04 DIAGNOSIS — I2510 Atherosclerotic heart disease of native coronary artery without angina pectoris: Secondary | ICD-10-CM | POA: Diagnosis not present

## 2017-09-04 DIAGNOSIS — Z8673 Personal history of transient ischemic attack (TIA), and cerebral infarction without residual deficits: Secondary | ICD-10-CM | POA: Diagnosis not present

## 2017-09-04 MED ORDER — SODIUM CHLORIDE 0.9 % IV SOLN: 500.0000 mL | Freq: Once | INTRAVENOUS | Status: DC

## 2017-09-04 NOTE — Op Note (Signed)
[facility] Patient Name: Hassen Bruun Procedure Date: 09/04/2017 11:34 AM MRN: [id number] Endoscopist: Jerene Bears , MD Age: 72 Referring MD:  Date of Birth: [birth year] Gender: Male Account #: [id number] Procedure:                Colonoscopy Indications:              Screening for colorectal malignant neoplasm, Last                            colonoscopy 10 years ago Medicines:                Monitored Anesthesia Care Procedure:                Pre-Anesthesia Assessment:                           - Prior to the procedure, a History and Physical                            was performed, and patient medications and                            allergies were reviewed. The patient's tolerance of                            previous anesthesia was also reviewed. The risks                            and benefits of the procedure and the sedation                            options and risks were discussed with the patient.                            All questions were answered, and informed consent                            was obtained. Prior Anticoagulants: The patient has                            taken Plavix (clopidogrel), last dose was 5 days                            prior to procedure. ASA Grade Assessment: III - A                            patient with severe systemic disease. After                            reviewing the risks and benefits, the patient was                            deemed in satisfactory condition to undergo the  procedure.                           After obtaining informed consent, the colonoscope                            was passed under direct vision. Throughout the                            procedure, the patient's blood pressure, pulse, and                            oxygen saturations were monitored continuously. The                            Colonoscope was introduced through the anus and        advanced to the the cecum, identified by                            appendiceal orifice and ileocecal valve. The                            colonoscopy was performed without difficulty. The                            patient tolerated the procedure well. The quality                            of the bowel preparation was excellent. The                            ileocecal valve, appendiceal orifice, and rectum                            were photographed. Scope In: 11:48:20 AM Scope Out: 12:05:43 PM Scope Withdrawal Time: 0 hours 12 minutes 59 seconds  Total Procedure Duration: 0 hours 17 minutes 23 seconds  Findings:                 The digital rectal exam was normal.                           Two sessile polyps were found in the ascending                            colon. The polyps were 4 to 5 mm in size. These                            polyps were removed with a cold snare. Resection                            and retrieval were complete.                           A 5 mm polyp was found in  the transverse colon. The                            polyp was pedunculated. The polyp was removed with                            a cold snare. Resection and retrieval were complete.                           A 3 mm polyp was found in the transverse colon. The                            polyp was sessile. The polyp was removed with a                            cold snare. Resection and retrieval were complete.                           A few small-mouthed diverticula were found in the                            sigmoid colon.                           Internal hemorrhoids were found during                            retroflexion. The hemorrhoids were small. Complications:            No immediate complications. Estimated Blood Loss:     Estimated blood loss was minimal. Impression:               - Two 4 to 5 mm polyps in the ascending colon,                            removed with a cold  snare. Resected and retrieved.                           - One 5 mm polyp in the transverse colon, removed                            with a cold snare. Resected and retrieved.                           - One 3 mm polyp in the transverse colon, removed                            with a cold snare. Resected and retrieved.                           - Mild diverticulosis in the sigmoid colon.                           - Small internal hemorrhoids. Recommendation:           -  Patient has a contact number available for                            emergencies. The signs and symptoms of potential                            delayed complications were discussed with the                            patient. Return to normal activities tomorrow.                            Written discharge instructions were provided to the                            patient.                           - Resume previous diet.                           - Continue present medications.                           - Await pathology results.                           - Repeat colonoscopy is recommended. The                            colonoscopy date will be determined after pathology                            results from today's exam become available for                            review.                           - Resume Plavix (clopidogrel) at prior dose today.                            Refer to managing physician for further adjustment                            of therapy. Jerene Bears, MD 09/04/2017 12:09:24 PM This report has been signed electronically.

## 2017-09-04 NOTE — Progress Notes (Signed)
Called to room to assist during endoscopic procedure.  Patient ID and intended procedure confirmed with present staff. Received instructions for my participation in the procedure from the performing physician.  

## 2017-09-04 NOTE — Patient Instructions (Signed)
YOU HAD AN ENDOSCOPIC PROCEDURE TODAY AT [facility]:   Refer to the procedure report that was given to you for any specific questions about what was found during the examination.  If the procedure report does not answer your questions, please call your gastroenterologist to clarify.  If you requested that your care partner not be given the details of your procedure findings, then the procedure report has been included in a sealed envelope for you to review at your convenience later.  YOU SHOULD EXPECT: Some feelings of bloating in the abdomen. Passage of more gas than usual.  Walking can help get rid of the air that was put into your GI tract during the procedure and reduce the bloating. If you had a lower endoscopy (such as a colonoscopy or flexible sigmoidoscopy) you may notice spotting of blood in your stool or on the toilet paper. If you underwent a bowel prep for your procedure, you may not have a normal bowel movement for a few days.  Please Note:  You might notice some irritation and congestion in your nose or some drainage.  This is from the oxygen used during your procedure.  There is no need for concern and it should clear up in a day or so.  SYMPTOMS TO REPORT IMMEDIATELY:   Following lower endoscopy (colonoscopy or flexible sigmoidoscopy):  Excessive amounts of blood in the stool  Significant tenderness or worsening of abdominal pains  Swelling of the abdomen that is new, acute  Fever of 100F or higher   For urgent or emergent issues, a gastroenterologist can be reached at any hour by calling [phone].   DIET:  We do recommend a small meal at first, but then you may proceed to your regular diet.  Drink plenty of fluids but you should avoid alcoholic beverages for 24 hours.  ACTIVITY:  You should plan to take it easy for the rest of today and you should NOT DRIVE or use heavy machinery until tomorrow (because of the sedation medicines used during the test).     FOLLOW UP: Our staff will call the number listed on your records the next business day following your procedure to check on you and address any questions or concerns that you may have regarding the information given to you following your procedure. If we do not reach you, we will leave a message.  However, if you are feeling well and you are not experiencing any problems, there is no need to return our call.  We will assume that you have returned to your regular daily activities without incident.  If any biopsies were taken you will be contacted by phone or by letter within the next 1-3 weeks.  Please call us at [phone] if you have not heard about the biopsies in 3 weeks.    SIGNATURES/CONFIDENTIALITY: You and/or your care partner have signed paperwork which will be entered into your electronic medical record.  These signatures attest to the fact that that the information above on your After Visit Summary has been reviewed and is understood.  Full responsibility of the confidentiality of this discharge information lies with you and/or your care-partner.   Resume Plavix At prior dose today,continue remainder of medications. Information given on polyps,diverticulosis and hemorrhoids.

## 2017-09-04 NOTE — Progress Notes (Signed)
Report given to PACU, vss 

## 2017-09-05 ENCOUNTER — Telehealth: Payer: Self-pay

## 2017-09-05 NOTE — Telephone Encounter (Signed)
  Follow up Call-  Call back number 09/04/2017  Post procedure Call Back phone  # [phone]  Permission to leave phone message Yes  comments may speak with wife  Some recent data might be hidden     Patient questions:  Do you have a fever, pain , or abdominal swelling? No. Pain Score  0 *  Have you tolerated food without any problems? Yes.    Have you been able to return to your normal activities? Yes.    Do you have any questions about your discharge instructions: Diet   No. Medications  No. Follow up visit  No.  Do you have questions or concerns about your Care? No.  Actions: * If pain score is 4 or above: No action needed, pain <4.

## 2017-09-10 ENCOUNTER — Encounter: Payer: Self-pay | Admitting: Internal Medicine

## 2017-09-17 ENCOUNTER — Ambulatory Visit: Payer: Medicare HMO

## 2017-10-01 ENCOUNTER — Ambulatory Visit: Payer: Medicare HMO

## 2017-10-01 NOTE — Progress Notes (Deleted)
Patient ID: Austin [name]                 DOB: July 17, 1946                    MRN: [id number]     HPI: MELDON HANZLIK is a 72 y.o. male patient referred to lipid clinic by Dr Burt Knack. PMH is significant for CAD, recurrent angina, multiple PCI procedures with most recent in June 2017, HTN, and TIA. He has a history of statin intolerance and presents to lipid clinic for further management.  PCSK9i [employer]  Current Medications:  Intolerances: Zetia 10mg  daily (shortness of breath, chest pain), Crestor 5mg  daily and 40mg  daily, pravastatin 20mg  daily - myalgias Risk Factors: CAD s/p PCI, TIA, age LDL goal: 70mg /dL  Diet:   Exercise:   Family History: The patient's family history includes Cancer in his mother and sister; Diabetes in his mother; Hyperlipidemia in his mother; Stroke in his father.   Social History: The patient  reports that  has never smoked. he has never used smokeless tobacco. He reports that he does not drink alcohol or use drugs.   Labs: 06/14/17: TC 286, TG 180, HDL 43.1, LDL 207 (no therapy)  Past Medical History:  Diagnosis Date  . Allergy   . Arthritis   . CAP (community acquired pneumonia)   . Cataract    bilateral  . Coronary artery disease    03/13/2016 DES to first OM, EF 55%  . Heart murmur    "outgrew it" (03/13/2016)  . Hematuria   . HTN (hypertension)   . Hx of cardiovascular stress test    ETT-Myoview (10/15): Nondiagnostic EKG changes, anterolateral and inferolateral scar, no ischemia, EF 48%, low risk  . Hyperlipemia   . Hyperlipidemia    NMR Lipoprofile 2008: LDL 168 ( 2410/ 1826), HDL 41, TG 71. LDL goal = <100, ideally <  70. Framingham Study LDL goal = < 130.  Marland Kitchen Kidney stones    "passed them"; Dr. Terance Hart (03/13/2016)  . Myocardial infarction ([place]) 2001  . Non Hodgkin's lymphoma [facility])    [facility]  . Pleural effusion   . TIA (transient ischemic attack) 1980s   "I was exercising when I had it"    Current  Outpatient Medications on File Prior to Visit  Medication Sig Dispense Refill  . amLODipine (NORVASC) 10 MG tablet TAKE 1 TABLET (10 MG TOTAL) BY MOUTH DAILY. 90 tablet 1  . aspirin 81 MG tablet Take 1 tablet (81 mg total) by mouth daily. 30 tablet   . clopidogrel (PLAVIX) 75 MG tablet TAKE 1 TABLET(75 MG) BY MOUTH DAILY 90 tablet 3  . finasteride (PROSCAR) 5 MG tablet Take 1 tablet (5 mg total) by mouth daily. 90 tablet 3  . nitroGLYCERIN (NITROSTAT) 0.4 MG SL tablet PLACE AND DISSOLVE 1 TABLET UNDER THE TONGUE EVERY 5 MINUTES AS NEEDED FOR CHEST PAIN 25 tablet 3  . timolol (BETIMOL) 0.25 % ophthalmic solution Place 1 drop into both eyes 2 (two) times daily.      No current facility-administered medications on file prior to visit.     Allergies  Allergen Reactions  . Zetia [Ezetimibe] Shortness Of Breath and Other (See Comments)    Chest pain  . Crestor [Rosuvastatin] Other (See Comments)    Myalgias  . Isosorbide Mononitrate [Isosorbide Nitrate] Other (See Comments)    Headache, throat soreness and hoarse  . Pravachol [Pravastatin Sodium] Other (See Comments)  Myalgias  . Silver Rash    Assessment/Plan:  1. Hyperlipidemia -

## 2017-10-21 IMAGING — DX DG CHEST 2V
2 series · 2 of 2 positions shown · non-contrast
Comparison: 08/31/2016, 10/31/2013

CLINICAL DATA: Constant hiccups for 5 days mid chest pain

EXAM:
CHEST  2 VIEW

[chest pa]
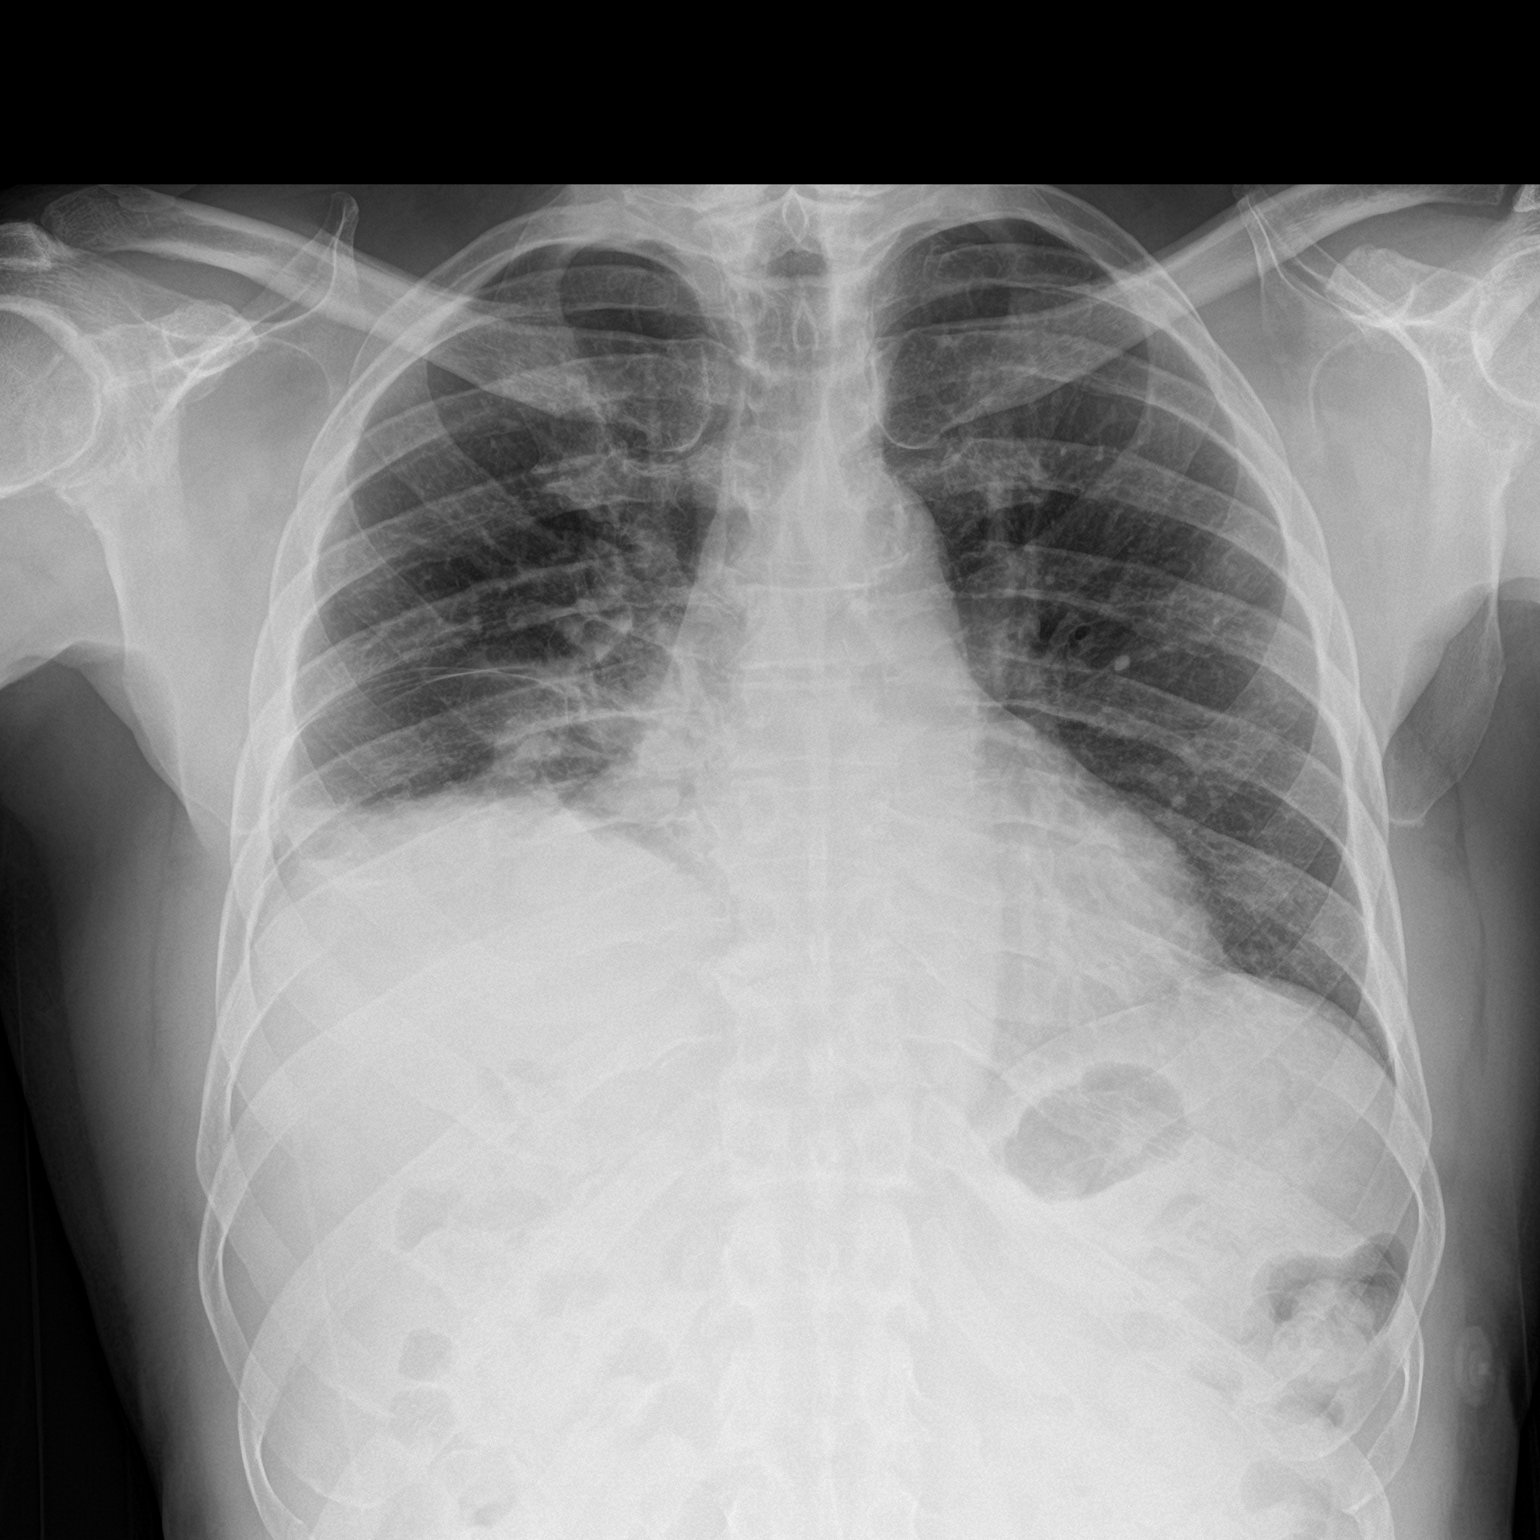

[chest lat]
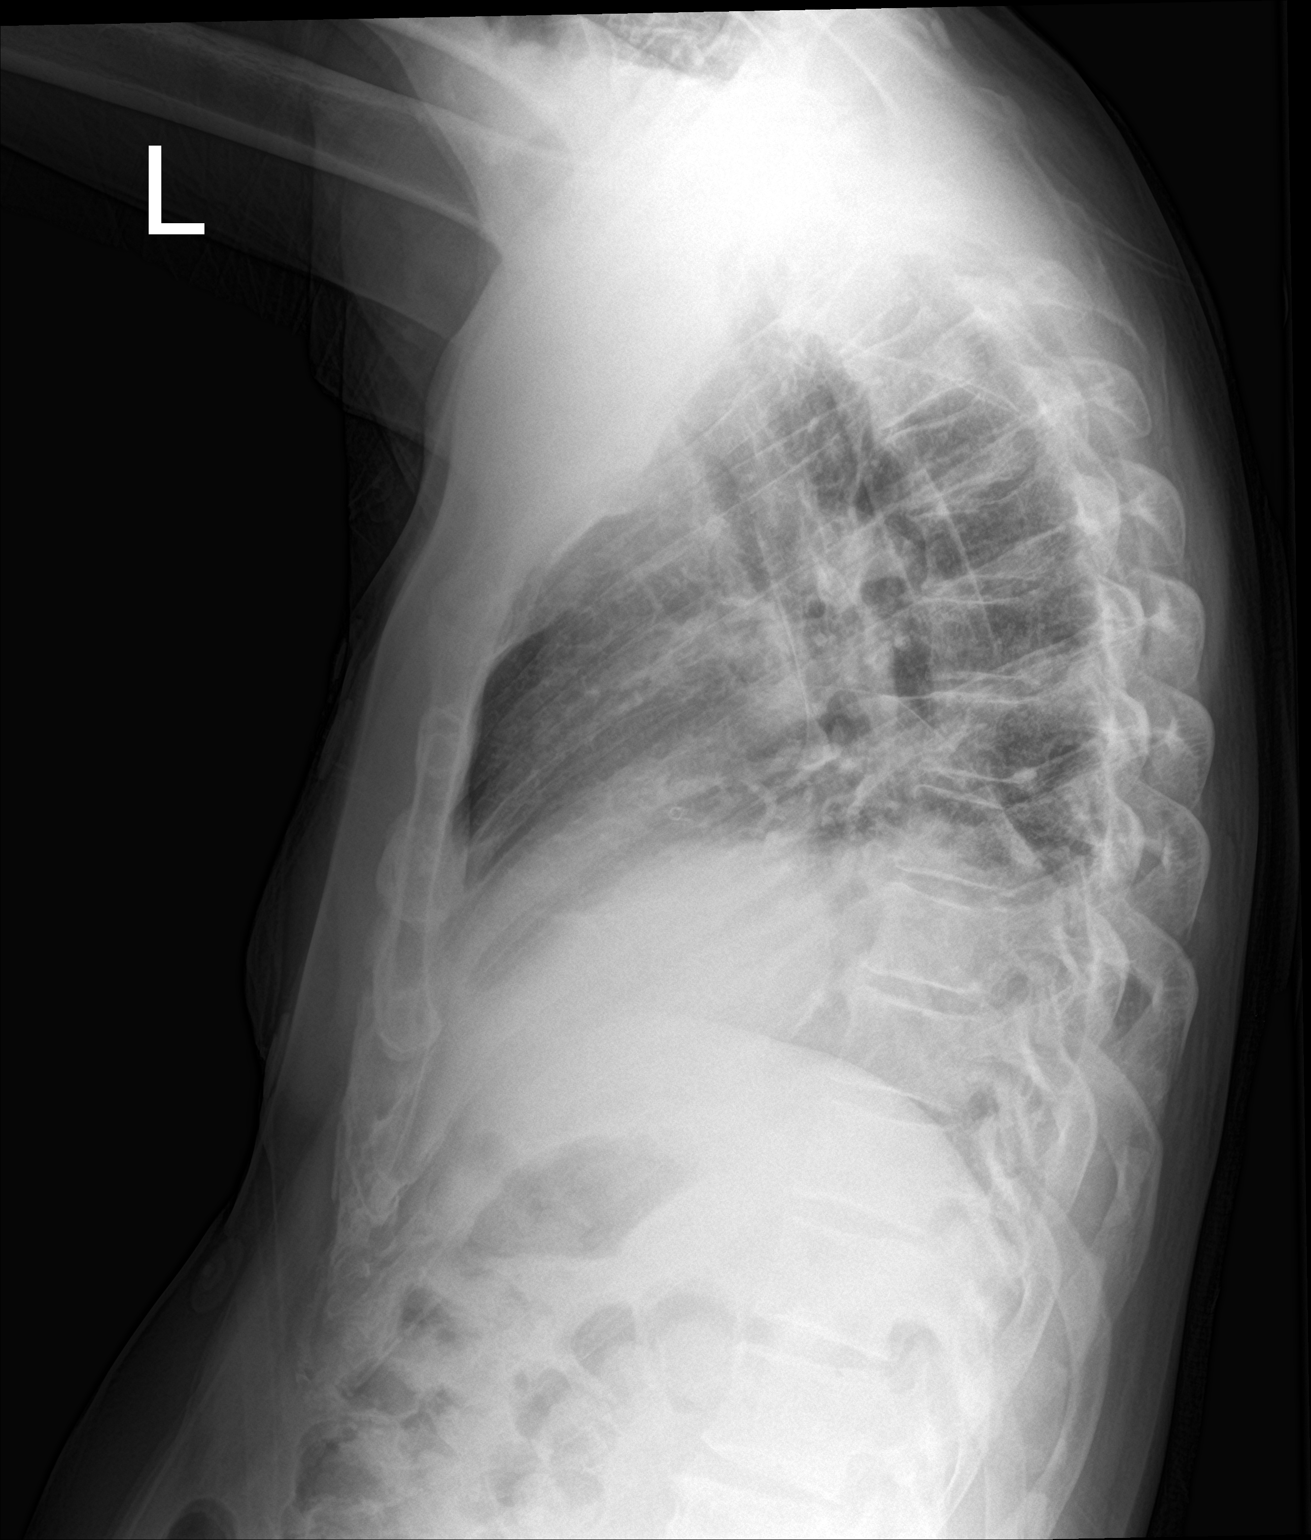

[2 of 2 positions shown; findings below may reference images not displayed]

FINDINGS: The left lung is grossly clear. Elevation of the right diaphragm.
Small right effusion and right basilar atelectasis or infiltrate not
significantly changed. Stable cardiomediastinal silhouette. No
pneumothorax.
IMPRESSION: Stable elevation of the right diaphragm with small pleural effusion
and right basilar atelectasis or infiltrate.

## 2017-11-06 IMAGING — DX DG CHEST 2V
2 series · 2 of 2 positions shown · non-contrast
Comparison: Chest x-rays September 05, 2016 and October 31, 2013

CLINICAL DATA: Follow-up of pneumonia. History of coronary artery
disease, previous MI, stent placement.

EXAM:
CHEST  2 VIEW

[chest pa]
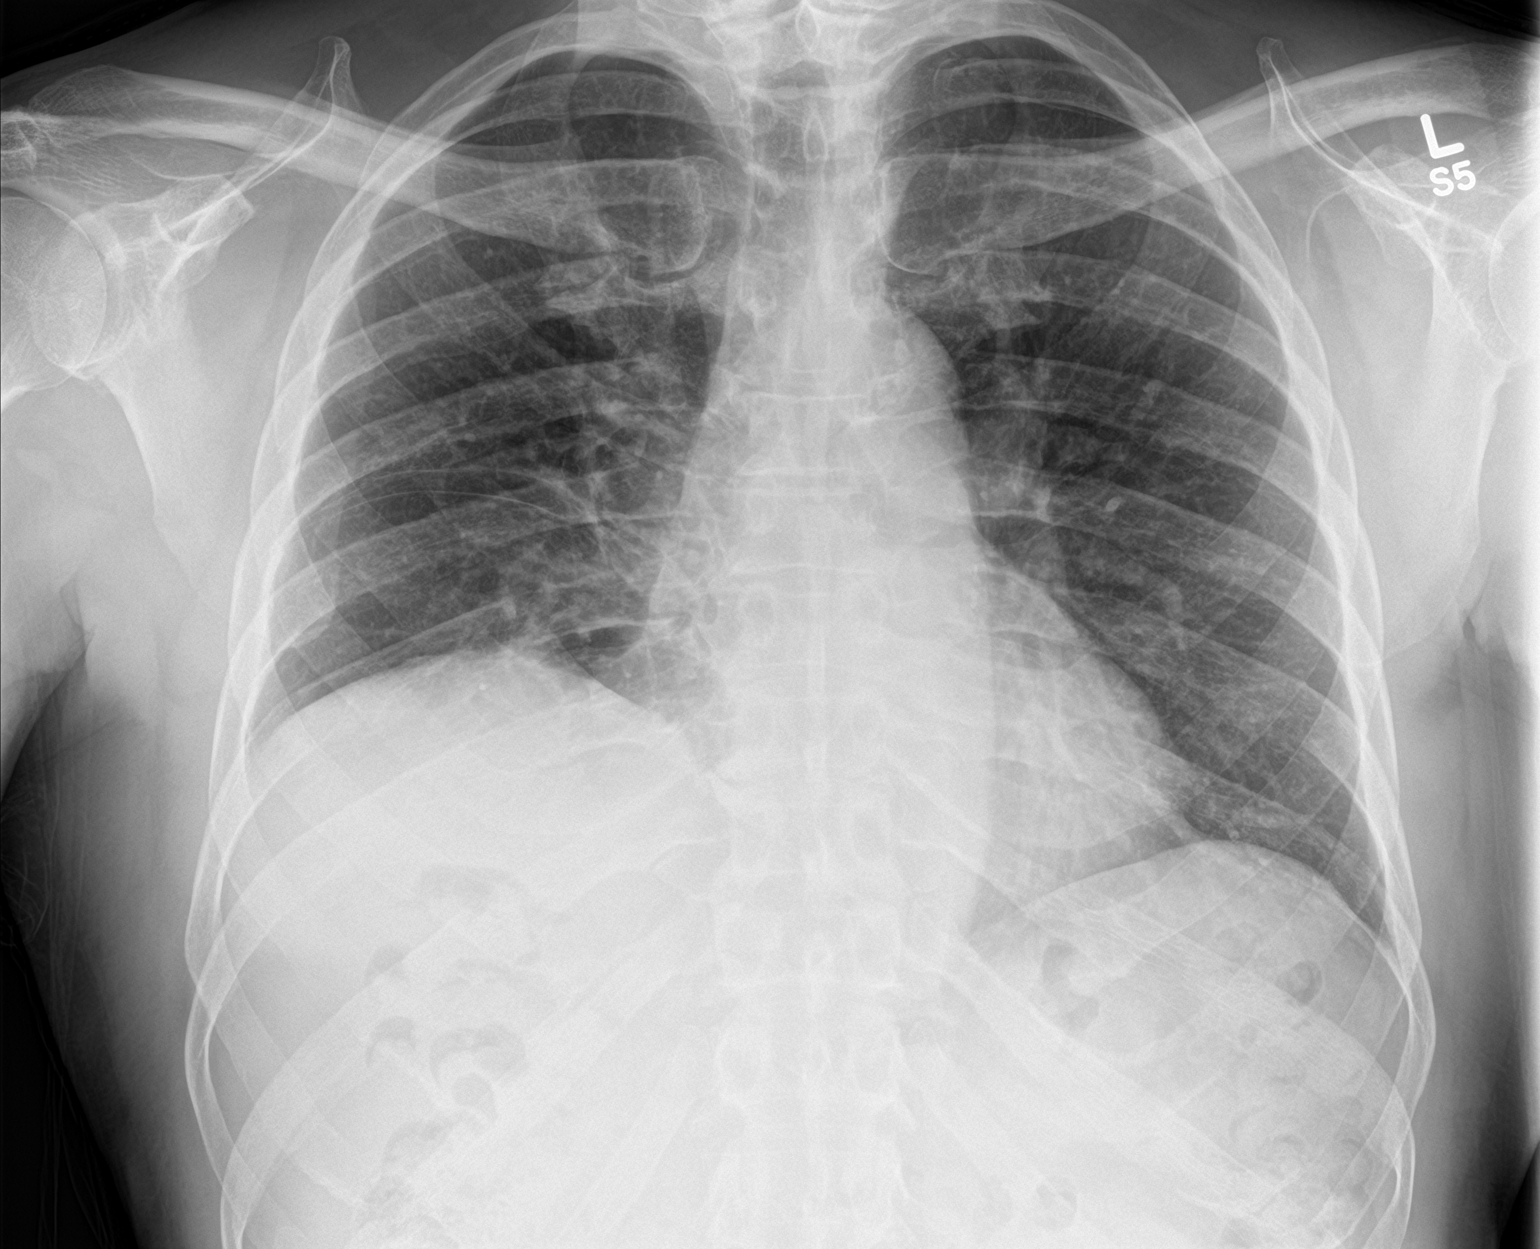

[chest lat]
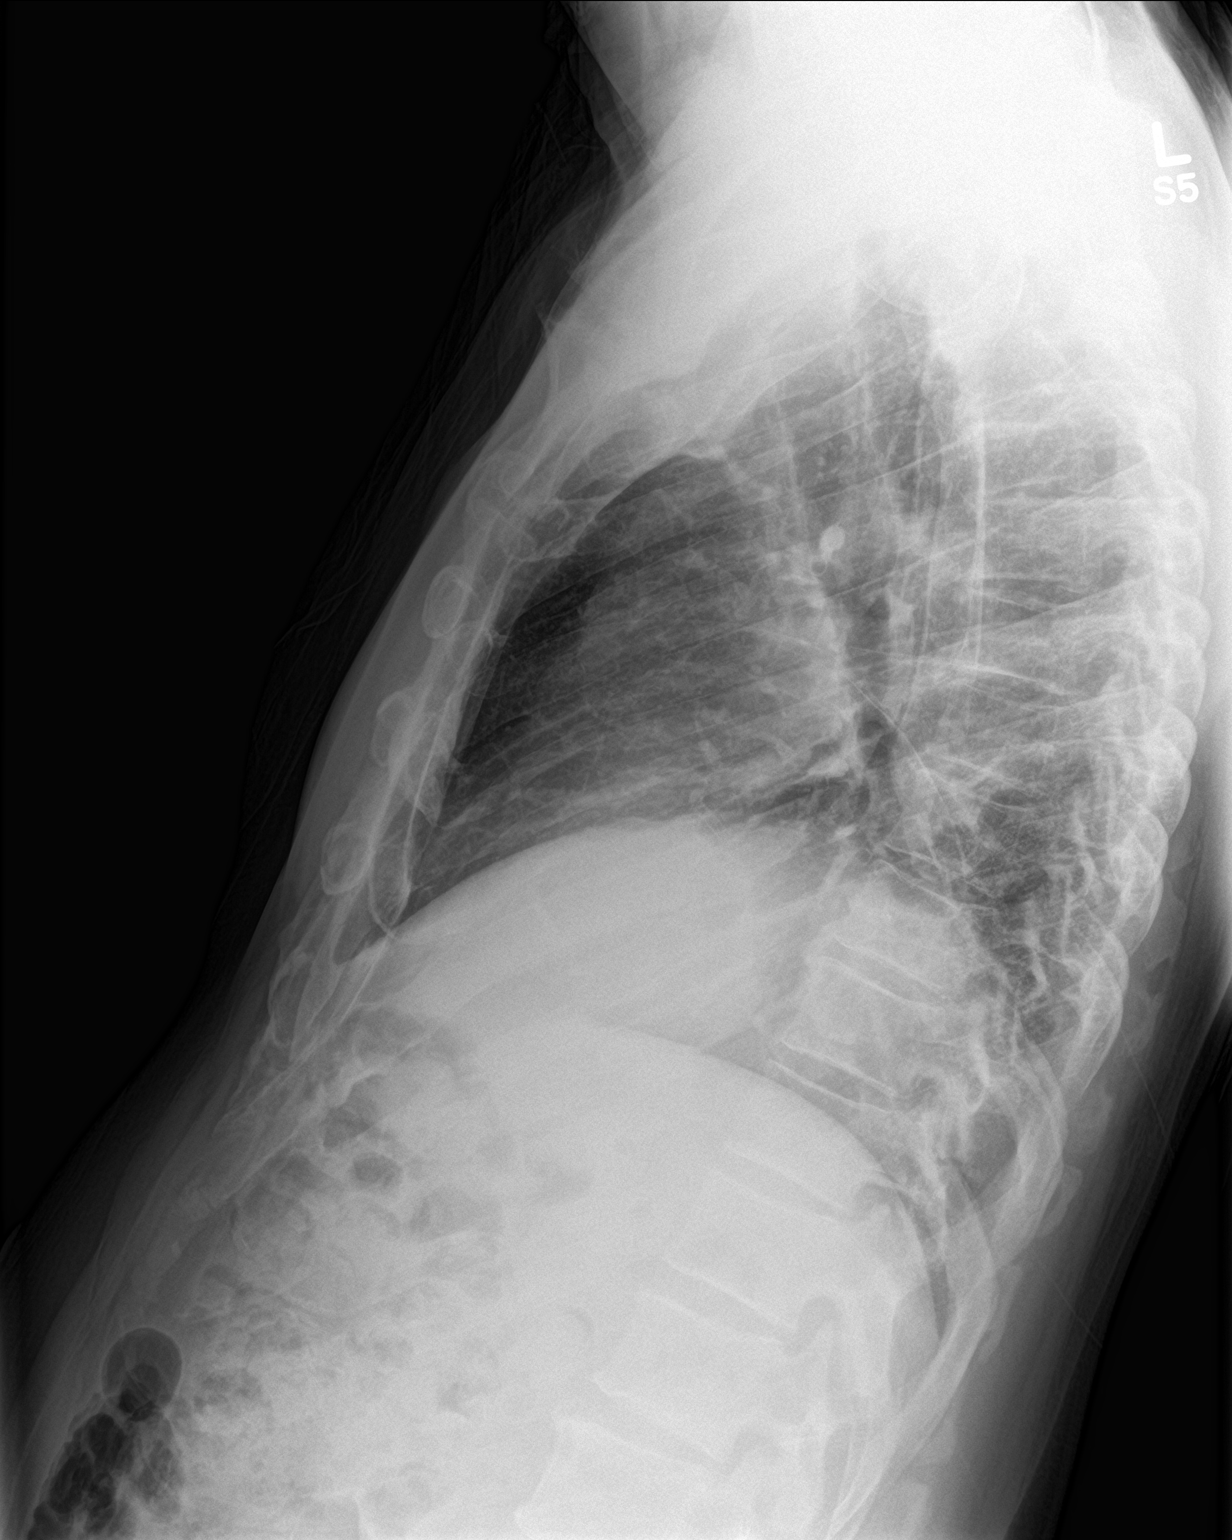

[2 of 2 positions shown; findings below may reference images not displayed]

FINDINGS: The right hemidiaphragm remains higher than the left. Infiltrate at
the right lung base has nearly totally resolved. No new infiltrate
is observed. There is no pleural effusion. The heart and pulmonary
vascularity are normal. The bony thorax exhibits no acute
abnormality.
IMPRESSION: Interval clearing of right lower lobe pneumonia. Chronic elevation
of the right hemidiaphragm. Additional follow-up chest x-ray in 2-3
weeks is recommended to assure complete clearing.

## 2017-12-03 ENCOUNTER — Other Ambulatory Visit: Payer: Self-pay | Admitting: Cardiovascular Disease

## 2017-12-03 ENCOUNTER — Ambulatory Visit: Payer: Medicare HMO

## 2017-12-03 NOTE — Progress Notes (Deleted)
Subjective:   Austin [name] is a 72 y.o. male who presents for an Initial Medicare Annual Wellness Visit.  Review of Systems  No ROS.  Medicare Wellness Visit. Additional risk factors are reflected in the social history.    Sleep patterns: {SX; SLEEP PATTERNS:18802::"feels rested on waking","does not get up to void","gets up *** times nightly to void","sleeps *** hours nightly"}.    Home Safety/Smoke Alarms: Feels safe in home. Smoke alarms in place.  Living environment; residence and Firearm Safety: {Rehab home environment / accessibility:30080::"no firearms","firearms stored safely"}. Seat Belt Safety/Bike Helmet: Wears seat belt.   PSA-  Lab Results  Component Value Date   PSA 2.58 03/03/2010   PSA 7.45 (H) 12/28/2009   PSA 8.78 (H) 12/15/2009      Objective:    There were no vitals filed for this visit. There is no height or weight on file to calculate BMI.  Advanced Directives 09/04/2017 11/11/2016 09/05/2016 03/13/2016 12/22/2015  Does Patient Have a Medical Advance Directive? No No No Yes No  Does patient want to make changes to medical advance directive? - - - No - Patient declined -  Copy of [place] in Chart? - - - No - copy requested -  Would patient like information on creating a medical advance directive? - - No - Patient declined No - patient declined information No - patient declined information    Current Medications (verified) Outpatient Encounter Medications as of 12/03/2017  Medication Sig  . amLODipine (NORVASC) 10 MG tablet TAKE 1 TABLET (10 MG TOTAL) BY MOUTH DAILY.  Marland Kitchen aspirin 81 MG tablet Take 1 tablet (81 mg total) by mouth daily.  . clopidogrel (PLAVIX) 75 MG tablet TAKE 1 TABLET(75 MG) BY MOUTH DAILY  . finasteride (PROSCAR) 5 MG tablet Take 1 tablet (5 mg total) by mouth daily.  . nitroGLYCERIN (NITROSTAT) 0.4 MG SL tablet PLACE AND DISSOLVE 1 TABLET UNDER THE TONGUE EVERY 5 MINUTES AS NEEDED FOR CHEST PAIN  . timolol (BETIMOL)  0.25 % ophthalmic solution Place 1 drop into both eyes 2 (two) times daily.    No facility-administered encounter medications on file as of 12/03/2017.     Allergies (verified) Zetia [ezetimibe]; Crestor [rosuvastatin]; Isosorbide mononitrate [isosorbide nitrate]; Pravachol [pravastatin sodium]; and Silver   History: Past Medical History:  Diagnosis Date  . Allergy   . Arthritis   . CAP (community acquired pneumonia)   . Cataract    bilateral  . Coronary artery disease    03/13/2016 DES to first OM, EF 55%  . Heart murmur    "outgrew it" (03/13/2016)  . Hematuria   . HTN (hypertension)   . Hx of cardiovascular stress test    ETT-Myoview (10/15): Nondiagnostic EKG changes, anterolateral and inferolateral scar, no ischemia, EF 48%, low risk  . Hyperlipemia   . Hyperlipidemia    NMR Lipoprofile 2008: LDL 168 ( 2410/ 1826), HDL 41, TG 71. LDL goal = <100, ideally <  70. Framingham Study LDL goal = < 130.  Marland Kitchen Kidney stones    "passed them"; Dr. Terance Hart (03/13/2016)  . Myocardial infarction ([place]) 2001  . Non Hodgkin's lymphoma ([place])    [facility]  . Pleural effusion   . TIA (transient ischemic attack) 1980s   "I was exercising when I had it"   Past Surgical History:  Procedure Laterality Date  . CARDIAC CATHETERIZATION N/A 12/22/2015   Procedure: Left Heart Cath and Coronary Angiography;  Surgeon: Sherren Mocha, MD;  Location:  [facility] [facility];  Service: Cardiovascular;  Laterality: N/A;  . CARDIAC CATHETERIZATION N/A 12/22/2015   Procedure: Coronary Stent Intervention;  Surgeon: Sherren Mocha, MD;  Location: [place] [facility];  Service: Cardiovascular;  Laterality: N/A;  . CARDIAC CATHETERIZATION N/A 12/22/2015   Procedure: Intravascular Pressure Wire/FFR Study;  Surgeon: Sherren Mocha, MD;  Location: [place] [facility];  Service: Cardiovascular;  Laterality: N/A;  . CARDIAC CATHETERIZATION N/A 03/13/2016   Procedure: Left Heart Cath and Coronary Angiography;  Surgeon:  Sherren Mocha, MD;  Location: [place] [facility];  Service: Cardiovascular;  Laterality: N/A;  . COLONOSCOPY  2002  . CORONARY ANGIOPLASTY WITH STENT PLACEMENT  ~ 2013   "1 stent"  . ESOPHAGOGASTRODUODENOSCOPY (EGD) WITH ESOPHAGEAL DILATION  2002  . KNEE ARTHROSCOPY Left 1990s   Family History  Problem Relation Age of Onset  . Cancer Mother        lung cancer  . Diabetes Mother   . Hyperlipidemia Mother   . Stroke Father        age 36  . Prostate cancer Father   . Cancer Sister        ?lymphoma   Social History   Socioeconomic History  . Marital status: Married    Spouse name: Not on file  . Number of children: Not on file  . Years of education: Not on file  . Highest education level: Not on file  Occupational History  . Not on file  Social Needs  . Financial resource strain: Not on file  . Food insecurity:    Worry: Not on file    Inability: Not on file  . Transportation needs:    Medical: Not on file    Non-medical: Not on file  Tobacco Use  . Smoking status: Never Smoker  . Smokeless tobacco: Never Used  Substance and Sexual Activity  . Alcohol use: No    Comment: drank some in my 20's"  . Drug use: No  . Sexual activity: Yes  Lifestyle  . Physical activity:    Days per week: Not on file    Minutes per session: Not on file  . Stress: Not on file  Relationships  . Social connections:    Talks on phone: Not on file    Gets together: Not on file    Attends religious service: Not on file    Active member of club or organization: Not on file    Attends meetings of clubs or organizations: Not on file    Relationship status: Not on file  Other Topics Concern  . Not on file  Social History Narrative  . Not on file   Tobacco Counseling Counseling given: Not Answered   Activities of Daily Living No flowsheet data found.   Immunizations and Health Maintenance Immunization History  Administered Date(s) Administered  . Pneumococcal Polysaccharide-23  08/28/2010  . Td 05/16/2006   Health Maintenance Due  Topic Date Due  . PNA vac Low Risk Adult (2 of 2 - PCV13) 08/29/2011  . TETANUS/TDAP  05/16/2016    Patient Care Team: Binnie Rail, MD as PCP - General (Internal Medicine)  Indicate any recent Medical Services you may have received from other than [facility] providers in the past year (date may be approximate).    Assessment:   This is a routine wellness examination for Austin [name]. Physical assessment deferred to PCP.   Hearing/Vision screen No exam data present  Dietary issues and exercise activities discussed:   Diet (meal  preparation, eat out, water intake, caffeinated beverages, dairy products, fruits and vegetables): {Desc; diets:16563}   Goals    . LDL CALC < 70      Depression Screen PHQ 2/9 Scores 06/14/2017 09/08/2016 10/31/2013  PHQ - 2 Score 0 0 0    Fall Risk Fall Risk  06/14/2017 09/08/2016 10/31/2013  Falls in the past year? No No No    Cognitive Function:        Screening Tests Health Maintenance  Topic Date Due  . PNA vac Low Risk Adult (2 of 2 - PCV13) 08/29/2011  . TETANUS/TDAP  05/16/2016  . INFLUENZA VACCINE  03/28/2018  . COLONOSCOPY  09/04/2020  . Hepatitis C Screening  Completed      Plan:     I have personally reviewed and noted the following in the patient's chart:   . Medical and social history . Use of alcohol, tobacco or illicit drugs  . Current medications and supplements . Functional ability and status . Nutritional status . Physical activity . Advanced directives . List of other physicians . Vitals . Screenings to include cognitive, depression, and falls . Referrals and appointments  In addition, I have reviewed and discussed with patient certain preventive protocols, quality metrics, and best practice recommendations. A written personalized care plan for preventive services as well as general preventive health recommendations were provided to patient.     Michiel Cowboy,  RN   12/03/2017

## 2017-12-05 ENCOUNTER — Ambulatory Visit (INDEPENDENT_AMBULATORY_CARE_PROVIDER_SITE_OTHER): Payer: Medicare HMO | Admitting: *Deleted

## 2017-12-05 ENCOUNTER — Other Ambulatory Visit: Payer: Self-pay | Admitting: Cardiovascular Disease

## 2017-12-05 VITALS — BP 126/72 | HR 59 | Resp 18 | Ht 73.0 in | Wt 207.0 lb

## 2017-12-05 DIAGNOSIS — Z23 Encounter for immunization: Secondary | ICD-10-CM

## 2017-12-05 DIAGNOSIS — Z0000 Encounter for general adult medical examination without abnormal findings: Secondary | ICD-10-CM | POA: Diagnosis not present

## 2017-12-05 NOTE — Patient Instructions (Addendum)
Continue doing brain stimulating activities (puzzles, reading, adult coloring books, staying active) to keep memory sharp.   Continue to eat heart healthy diet (full of fruits, vegetables, whole grains, lean protein, water--limit salt, fat, and sugar intake) and increase physical activity as tolerated.   Austin [name] , Thank you for taking time to come for your Medicare Wellness Visit. I appreciate your ongoing commitment to your health goals. Please review the following plan we discussed and let me know if I can assist you in the future.   These are the goals we discussed: Goals    . LDL CALC < 70    . Patient Stated     Continue to go to the gym and exercise and modify my diet to lower my cholesterol and lower HGB A1c       This is a list of the screening recommended for you and due dates:  Health Maintenance  Topic Date Due  . Pneumonia vaccines (2 of 2 - PCV13) 08/29/2011  . Tetanus Vaccine  05/16/2016  . Flu Shot  03/28/2018  . Colon Cancer Screening  09/04/2020  .  Hepatitis C: One time screening is recommended by Center for Disease Control  (CDC) for  adults born from [birth year] through 1966.   Completed     Carbohydrate Counting for Diabetes Mellitus, Adult Carbohydrate counting is a method for keeping track of how many carbohydrates you eat. Eating carbohydrates naturally increases the amount of sugar (glucose) in the blood. Counting how many carbohydrates you eat helps keep your blood glucose within normal limits, which helps you manage your diabetes (diabetes mellitus). It is important to know how many carbohydrates you can safely have in each meal. This is different for every person. A diet and nutrition specialist (registered dietitian) can help you make a meal plan and calculate how many carbohydrates you should have at each meal and snack. Carbohydrates are found in the following foods:  Grains, such as breads and cereals.  Dried beans and soy products.  Starchy  vegetables, such as potatoes, peas, and corn.  Fruit and fruit juices.  Milk and yogurt.  Sweets and snack foods, such as cake, cookies, candy, chips, and soft drinks.  How do I count carbohydrates? There are two ways to count carbohydrates in food. You can use either of the methods or a combination of both. Reading "Nutrition Facts" on packaged food The "Nutrition Facts" list is included on the labels of almost all packaged foods and beverages in the [place] It includes:  The serving size.  Information about nutrients in each serving, including the grams (g) of carbohydrate per serving.  To use the "Nutrition Facts":  Decide how many servings you will have.  Multiply the number of servings by the number of carbohydrates per serving.  The resulting number is the total amount of carbohydrates that you will be having.  Learning standard serving sizes of other foods When you eat foods containing carbohydrates that are not packaged or do not include "Nutrition Facts" on the label, you need to measure the servings in order to count the amount of carbohydrates:  Measure the foods that you will eat with a food scale or measuring cup, if needed.  Decide how many standard-size servings you will eat.  Multiply the number of servings by 15. Most carbohydrate-rich foods have about 15 g of carbohydrates per serving. ? For example, if you eat 8 oz (170 g) of strawberries, you will have eaten 2 servings and 30 g  of carbohydrates (2 servings x 15 g = 30 g).  For foods that have more than one food mixed, such as soups and casseroles, you must count the carbohydrates in each food that is included.  The following list contains standard serving sizes of common carbohydrate-rich foods. Each of these servings has about 15 g of carbohydrates:   hamburger bun or  English muffin.   oz (15 mL) syrup.   oz (14 g) jelly.  1 slice of bread.  1 six-inch tortilla.  3 oz (85 g) cooked rice or  pasta.  4 oz (113 g) cooked dried beans.  4 oz (113 g) starchy vegetable, such as peas, corn, or potatoes.  4 oz (113 g) hot cereal.  4 oz (113 g) mashed potatoes or  of a large baked potato.  4 oz (113 g) canned or frozen fruit.  4 oz (120 mL) fruit juice.  4-6 crackers.  6 chicken nuggets.  6 oz (170 g) unsweetened dry cereal.  6 oz (170 g) plain fat-free yogurt or yogurt sweetened with artificial sweeteners.  8 oz (240 mL) milk.  8 oz (170 g) fresh fruit or one small piece of fruit.  24 oz (680 g) popped popcorn.  Example of carbohydrate counting Sample meal  3 oz (85 g) chicken breast.  6 oz (170 g) brown rice.  4 oz (113 g) corn.  8 oz (240 mL) milk.  8 oz (170 g) strawberries with sugar-free whipped topping. Carbohydrate calculation 1. Identify the foods that contain carbohydrates: ? Rice. ? Corn. ? Milk. ? Strawberries. 2. Calculate how many servings you have of each food: ? 2 servings rice. ? 1 serving corn. ? 1 serving milk. ? 1 serving strawberries. 3. Multiply each number of servings by 15 g: ? 2 servings rice x 15 g = 30 g. ? 1 serving corn x 15 g = 15 g. ? 1 serving milk x 15 g = 15 g. ? 1 serving strawberries x 15 g = 15 g. 4. Add together all of the amounts to find the total grams of carbohydrates eaten: ? 30 g + 15 g + 15 g + 15 g = 75 g of carbohydrates total. This information is not intended to replace advice given to you by your health care provider. Make sure you discuss any questions you have with your health care provider. Document Released: 08/14/2005 Document Revised: 03/03/2016 Document Reviewed: 01/26/2016 Elsevier Interactive Patient Education  2018 [employer].  Fat and Cholesterol Restricted Diet Getting too much fat and cholesterol in your diet may cause health problems. Following this diet helps keep your fat and cholesterol at normal levels. This can keep you from getting sick. What types of fat should I  choose?  Choose monosaturated and polyunsaturated fats. These are found in foods such as olive oil, canola oil, flaxseeds, walnuts, almonds, and seeds.  Eat more omega-3 fats. Good choices include salmon, mackerel, sardines, tuna, flaxseed oil, and ground flaxseeds.  Limit saturated fats. These are in animal products such as meats, butter, and cream. They can also be in plant products such as palm oil, palm kernel oil, and coconut oil.  Avoid foods with partially hydrogenated oils in them. These contain trans fats. Examples of foods that have trans fats are stick margarine, some tub margarines, cookies, crackers, and other baked goods. What general guidelines do I need to follow?  Check food labels. Look for the words "trans fat" and "saturated fat."  When preparing a meal: ?  Fill half of your plate with vegetables and green salads. ? Fill one fourth of your plate with whole grains. Look for the word "whole" as the first word in the ingredient list. ? Fill one fourth of your plate with lean protein foods.  Eat more foods that have fiber, like apples, carrots, beans, peas, and barley.  Eat more home-cooked foods. Eat less at restaurants and buffets.  Limit or avoid alcohol.  Limit foods high in starch and sugar.  Limit fried foods.  Cook foods without frying them. Baking, boiling, grilling, and broiling are all great options.  Lose weight if you are overweight. Losing even a small amount of weight can help your overall health. It can also help prevent diseases such as diabetes and heart disease. What foods can I eat? Grains Whole grains, such as whole wheat or whole grain breads, crackers, cereals, and pasta. Unsweetened oatmeal, bulgur, barley, quinoa, or brown rice. Corn or whole wheat flour tortillas. Vegetables Fresh or frozen vegetables (raw, steamed, roasted, or grilled). Green salads. Fruits All fresh, canned (in natural juice), or frozen fruits. Meat and Other Protein  Products Ground beef (85% or leaner), grass-fed beef, or beef trimmed of fat. Skinless chicken or [place]. Ground chicken or [place]. Pork trimmed of fat. All fish and seafood. Eggs. Dried beans, peas, or lentils. Unsalted nuts or seeds. Unsalted canned or dry beans. Dairy Low-fat dairy products, such as skim or 1% milk, 2% or reduced-fat cheeses, low-fat ricotta or cottage cheese, or plain low-fat yogurt. Fats and Oils Tub margarines without trans fats. Light or reduced-fat mayonnaise and salad dressings. Avocado. Olive, canola, sesame, or safflower oils. Natural peanut or almond butter (choose ones without added sugar and oil). The items listed above may not be a complete list of recommended foods or beverages. Contact your dietitian for more options. What foods are not recommended? Grains White bread. White pasta. White rice. Cornbread. Bagels, pastries, and croissants. Crackers that contain trans fat. Vegetables White potatoes. Corn. Creamed or fried vegetables. Vegetables in a cheese sauce. Fruits Dried fruits. Canned fruit in light or heavy syrup. Fruit juice. Meat and Other Protein Products Fatty cuts of meat. Ribs, chicken wings, bacon, sausage, bologna, salami, chitterlings, fatback, hot dogs, bratwurst, and packaged luncheon meats. Liver and organ meats. Dairy Whole or 2% milk, cream, half-and-half, and cream cheese. Whole milk cheeses. Whole-fat or sweetened yogurt. Full-fat cheeses. Nondairy creamers and whipped toppings. Processed cheese, cheese spreads, or cheese curds. Sweets and Desserts Corn syrup, sugars, honey, and molasses. Candy. Jam and jelly. Syrup. Sweetened cereals. Cookies, pies, cakes, donuts, muffins, and ice cream. Fats and Oils Butter, stick margarine, lard, shortening, ghee, or bacon fat. Coconut, palm kernel, or palm oils. Beverages Alcohol. Sweetened drinks (such as sodas, lemonade, and fruit drinks or punches). The items listed above may not be a complete list  of foods and beverages to avoid. Contact your dietitian for more information. This information is not intended to replace advice given to you by your health care provider. Make sure you discuss any questions you have with your health care provider. Document Released: 02/13/2012 Document Revised: 04/20/2016 Document Reviewed: 11/13/2013 Elsevier Interactive Patient Education  2018 [place] refers to food and lifestyle choices that are based on the traditions of countries located on the [employer]. This way of eating has been shown to help prevent certain conditions and improve outcomes for people who have chronic diseases, like kidney disease and heart disease. What are  tips for following this plan? Lifestyle  Cook and eat meals together with your family, when possible.  Drink enough fluid to keep your urine clear or pale yellow.  Be physically active every day. This includes: ? Aerobic exercise like running or swimming. ? Leisure activities like gardening, walking, or housework.  Get 7-8 hours of sleep each night.  If recommended by your health care provider, drink red wine in moderation. This means 1 glass a day for nonpregnant women and 2 glasses a day for men. A glass of wine equals 5 oz (150 mL). Reading food labels  Check the serving size of packaged foods. For foods such as rice and pasta, the serving size refers to the amount of cooked product, not dry.  Check the total fat in packaged foods. Avoid foods that have saturated fat or trans fats.  Check the ingredients list for added sugars, such as corn syrup. Shopping  At the grocery store, buy most of your food from the areas near the walls of the store. This includes: ? Fresh fruits and vegetables (produce). ? Grains, beans, nuts, and seeds. Some of these may be available in unpackaged forms or large amounts (in bulk). ? Fresh seafood. ? Poultry and eggs. ? Low-fat dairy  products.  Buy whole ingredients instead of prepackaged foods.  Buy fresh fruits and vegetables in-season from local farmers markets.  Buy frozen fruits and vegetables in resealable bags.  If you do not have access to quality fresh seafood, buy precooked frozen shrimp or canned fish, such as tuna, salmon, or sardines.  Buy small amounts of raw or cooked vegetables, salads, or olives from the deli or salad bar at your store.  Stock your pantry so you always have certain foods on hand, such as olive oil, canned tuna, canned tomatoes, rice, pasta, and beans. Cooking  Cook foods with extra-virgin olive oil instead of using butter or other vegetable oils.  Have meat as a side dish, and have vegetables or grains as your main dish. This means having meat in small portions or adding small amounts of meat to foods like pasta or stew.  Use beans or vegetables instead of meat in common dishes like chili or lasagna.  Experiment with different cooking methods. Try roasting or broiling vegetables instead of steaming or sauteing them.  Add frozen vegetables to soups, stews, pasta, or rice.  Add nuts or seeds for added healthy fat at each meal. You can add these to yogurt, salads, or vegetable dishes.  Marinate fish or vegetables using olive oil, lemon juice, garlic, and fresh herbs. Meal planning  Plan to eat 1 vegetarian meal one day each week. Try to work up to 2 vegetarian meals, if possible.  Eat seafood 2 or more times a week.  Have healthy snacks readily available, such as: ? Vegetable sticks with hummus. ? [place] yogurt. ? Fruit and nut trail mix.  Eat balanced meals throughout the week. This includes: ? Fruit: 2-3 servings a day ? Vegetables: 4-5 servings a day ? Low-fat dairy: 2 servings a day ? Fish, poultry, or lean meat: 1 serving a day ? Beans and legumes: 2 or more servings a week ? Nuts and seeds: 1-2 servings a day ? Whole grains: 6-8 servings a day ? Extra-virgin  olive oil: 3-4 servings a day  Limit red meat and sweets to only a few servings a month What are my food choices?  Mediterranean diet ? Recommended ? Grains: Whole-grain pasta. Brown rice. Bulgar wheat.  Polenta. Couscous. Whole-wheat bread. [place]. ? Vegetables: Artichokes. Beets. Broccoli. Cabbage. Carrots. Eggplant. Green beans. Chard. Kale. Spinach. Onions. Leeks. Peas. Squash. Tomatoes. Peppers. Radishes. ? Fruits: Apples. Apricots. Avocado. Berries. Bananas. Cherries. Dates. Figs. Grapes. Lemons. Melon. Oranges. Peaches. Plums. Pomegranate. ? Meats and other protein foods: Beans. Almonds. Sunflower seeds. Pine nuts. Peanuts. [place]. Salmon. Scallops. Shrimp. [place]. Tilapia. Clams. Oysters. Eggs. ? Dairy: Low-fat milk. Cheese. Greek yogurt. ? Beverages: Water. Red wine. Herbal tea. ? Fats and oils: Extra virgin olive oil. Avocado oil. Grape seed oil. ? Sweets and desserts: [place] yogurt with honey. Baked apples. Poached pears. Trail mix. ? Seasoning and other foods: Basil. Cilantro. Coriander. Cumin. Mint. Parsley. Sage. Rosemary. Tarragon. Garlic. Oregano. Thyme. Pepper. Balsalmic vinegar. Tahini. Hummus. Tomato sauce. Olives. Mushrooms. ? Limit these ? Grains: Prepackaged pasta or rice dishes. Prepackaged cereal with added sugar. ? Vegetables: Deep fried potatoes (french fries). ? Fruits: Fruit canned in syrup. ? Meats and other protein foods: Beef. Pork. Lamb. Poultry with skin. Hot dogs. [place] Salines. ? Dairy: Ice cream. Sour cream. Whole milk. ? Beverages: Juice. Sugar-sweetened soft drinks. Beer. Liquor and spirits. ? Fats and oils: Butter. Canola oil. Vegetable oil. Beef fat (tallow). Lard. ? Sweets and desserts: Cookies. Cakes. Pies. Candy. ? Seasoning and other foods: Mayonnaise. Premade sauces and marinades. ? The items listed may not be a complete list. Talk with your dietitian about what dietary choices are right for you. Summary  The Mediterranean diet includes both food  and lifestyle choices.  Eat a variety of fresh fruits and vegetables, beans, nuts, seeds, and whole grains.  Limit the amount of red meat and sweets that you eat.  Talk with your health care provider about whether it is safe for you to drink red wine in moderation. This means 1 glass a day for nonpregnant women and 2 glasses a day for men. A glass of wine equals 5 oz (150 mL). This information is not intended to replace advice given to you by your health care provider. Make sure you discuss any questions you have with your health care provider. Document Released: 04/06/2016 Document Revised: 05/09/2016 Document Reviewed: 04/06/2016 Elsevier Interactive Patient Education  2018 [employer].  Diabetes Mellitus and Nutrition When you have diabetes (diabetes mellitus), it is very important to have healthy eating habits because your blood sugar (glucose) levels are greatly affected by what you eat and drink. Eating healthy foods in the appropriate amounts, at about the same times every day, can help you:  Control your blood glucose.  Lower your risk of heart disease.  Improve your blood pressure.  Reach or maintain a healthy weight.  Every person with diabetes is different, and each person has different needs for a meal plan. Your health care provider may recommend that you work with a diet and nutrition specialist (dietitian) to make a meal plan that is best for you. Your meal plan may vary depending on factors such as:  The calories you need.  The medicines you take.  Your weight.  Your blood glucose, blood pressure, and cholesterol levels.  Your activity level.  Other health conditions you have, such as heart or kidney disease.  How do carbohydrates affect me? Carbohydrates affect your blood glucose level more than any other type of food. Eating carbohydrates naturally increases the amount of glucose in your blood. Carbohydrate counting is a method for keeping track of how many  carbohydrates you eat. Counting carbohydrates is important to keep your blood glucose at a healthy  level, especially if you use insulin or take certain oral diabetes medicines. It is important to know how many carbohydrates you can safely have in each meal. This is different for every person. Your dietitian can help you calculate how many carbohydrates you should have at each meal and for snack. Foods that contain carbohydrates include:  Bread, cereal, rice, pasta, and crackers.  Potatoes and corn.  Peas, beans, and lentils.  Milk and yogurt.  Fruit and juice.  Desserts, such as cakes, cookies, ice cream, and candy.  How does alcohol affect me? Alcohol can cause a sudden decrease in blood glucose (hypoglycemia), especially if you use insulin or take certain oral diabetes medicines. Hypoglycemia can be a life-threatening condition. Symptoms of hypoglycemia (sleepiness, dizziness, and confusion) are similar to symptoms of having too much alcohol. If your health care provider says that alcohol is safe for you, follow these guidelines:  Limit alcohol intake to no more than 1 drink per day for nonpregnant women and 2 drinks per day for men. One drink equals 12 oz of beer, 5 oz of wine, or 1 oz of hard liquor.  Do not drink on an empty stomach.  Keep yourself hydrated with water, diet soda, or unsweetened iced tea.  Keep in mind that regular soda, juice, and other mixers may contain a lot of sugar and must be counted as carbohydrates.  What are tips for following this plan? Reading food labels  Start by checking the serving size on the label. The amount of calories, carbohydrates, fats, and other nutrients listed on the label are based on one serving of the food. Many foods contain more than one serving per package.  Check the total grams (g) of carbohydrates in one serving. You can calculate the number of servings of carbohydrates in one serving by dividing the total carbohydrates by 15.  For example, if a food has 30 g of total carbohydrates, it would be equal to 2 servings of carbohydrates.  Check the number of grams (g) of saturated and trans fats in one serving. Choose foods that have low or no amount of these fats.  Check the number of milligrams (mg) of sodium in one serving. Most people should limit total sodium intake to less than 2,300 mg per day.  Always check the nutrition information of foods labeled as "low-fat" or "nonfat". These foods may be higher in added sugar or refined carbohydrates and should be avoided.  Talk to your dietitian to identify your daily goals for nutrients listed on the label. Shopping  Avoid buying canned, premade, or processed foods. These foods tend to be high in fat, sodium, and added sugar.  Shop around the outside edge of the grocery store. This includes fresh fruits and vegetables, bulk grains, fresh meats, and fresh dairy. Cooking  Use low-heat cooking methods, such as baking, instead of high-heat cooking methods like deep frying.  Cook using healthy oils, such as olive, canola, or sunflower oil.  Avoid cooking with butter, cream, or high-fat meats. Meal planning  Eat meals and snacks regularly, preferably at the same times every day. Avoid going long periods of time without eating.  Eat foods high in fiber, such as fresh fruits, vegetables, beans, and whole grains. Talk to your dietitian about how many servings of carbohydrates you can eat at each meal.  Eat 4-6 ounces of lean protein each day, such as lean meat, chicken, fish, eggs, or tofu. 1 ounce is equal to 1 ounce of meat, chicken, or fish,  1 egg, or 1/4 cup of tofu.  Eat some foods each day that contain healthy fats, such as avocado, nuts, seeds, and fish. Lifestyle   Check your blood glucose regularly.  Exercise at least 30 minutes 5 or more days each week, or as told by your health care provider.  Take medicines as told by your health care provider.  Do not  use any products that contain nicotine or tobacco, such as cigarettes and e-cigarettes. If you need help quitting, ask your health care provider.  Work with a Social worker or diabetes educator to identify strategies to manage stress and any emotional and social challenges. What are some questions to ask my health care provider?  Do I need to meet with a diabetes educator?  Do I need to meet with a dietitian?  What number can I call if I have questions?  When are the best times to check my blood glucose? Where to find more information:  American Diabetes Association: diabetes.org/food-and-fitness/food  Academy of Nutrition and Dietetics: PokerClues.dk  [employer] of Diabetes and Digestive and Kidney Diseases (NIH): ContactWire.be Summary  A healthy meal plan will help you control your blood glucose and maintain a healthy lifestyle.  Working with a diet and nutrition specialist (dietitian) can help you make a meal plan that is best for you.  Keep in mind that carbohydrates and alcohol have immediate effects on your blood glucose levels. It is important to count carbohydrates and to use alcohol carefully. This information is not intended to replace advice given to you by your health care provider. Make sure you discuss any questions you have with your health care provider. Document Released: 05/11/2005 Document Revised: 09/18/2016 Document Reviewed: 09/18/2016 Elsevier Interactive Patient Education  [employer].

## 2017-12-05 NOTE — Progress Notes (Addendum)
Subjective:   Austin [name] is a 72 y.o. male who presents for an Initial Medicare Annual Wellness Visit.  Review of Systems  No ROS.  Medicare Wellness Visit. Additional risk factors are reflected in the social history.  Cardiac Risk Factors include: advanced age (>92men, >45 women);dyslipidemia;hypertension;male gender Sleep patterns: feels rested on waking, gets up 2-3 times nightly to void and sleeps 6 hours nightly.   Home Safety/Smoke Alarms: Feels safe in home. Smoke alarms in place.  Living environment; residence and Firearm Safety: 2-story house, no firearms. Lives with wife , no needs for DME, good support system Seat Belt Safety/Bike Helmet: Wears seat belt.   PSA-  Lab Results  Component Value Date   PSA 2.58 03/03/2010   PSA 7.45 (H) 12/28/2009   PSA 8.78 (H) 12/15/2009      Objective:    Today's Vitals   12/05/17 0935  BP: 126/72  Pulse: (!) 59  Resp: 18  SpO2: 98%  Weight: 207 lb (93.9 kg)  Height: 6\' 1"  (1.854 m)   Body mass index is 27.31 kg/m.  Advanced Directives 12/05/2017 09/04/2017 11/11/2016 09/05/2016 03/13/2016 12/22/2015  Does Patient Have a Medical Advance Directive? No No No No Yes No  Does patient want to make changes to medical advance directive? - - - - No - Patient declined -  Copy of [place] in Chart? - - - - No - copy requested -  Would patient like information on creating a medical advance directive? No - Patient declined - - No - Patient declined No - patient declined information No - patient declined information    Current Medications (verified) Outpatient Encounter Medications as of 12/05/2017  Medication Sig  . amLODipine (NORVASC) 10 MG tablet TAKE 1 TABLET (10 MG TOTAL) BY MOUTH DAILY.  Marland Kitchen aspirin 81 MG tablet Take 1 tablet (81 mg total) by mouth daily.  . clopidogrel (PLAVIX) 75 MG tablet TAKE 1 TABLET EVERY DAY  . finasteride (PROSCAR) 5 MG tablet Take 1 tablet (5 mg total) by mouth daily.  .  nitroGLYCERIN (NITROSTAT) 0.4 MG SL tablet PLACE AND DISSOLVE 1 TABLET UNDER THE TONGUE EVERY 5 MINUTES AS NEEDED FOR CHEST PAIN  . timolol (BETIMOL) 0.25 % ophthalmic solution Place 1 drop into both eyes 2 (two) times daily.   . [DISCONTINUED] clopidogrel (PLAVIX) 75 MG tablet TAKE 1 TABLET(75 MG) BY MOUTH DAILY   No facility-administered encounter medications on file as of 12/05/2017.     Allergies (verified) Zetia [ezetimibe]; Crestor [rosuvastatin]; Isosorbide mononitrate [isosorbide nitrate]; Pravachol [pravastatin sodium]; and Silver   History: Past Medical History:  Diagnosis Date  . Allergy   . Arthritis   . CAP (community acquired pneumonia)   . Cataract    bilateral  . Coronary artery disease    03/13/2016 DES to first OM, EF 55%  . Heart murmur    "outgrew it" (03/13/2016)  . Hematuria   . HTN (hypertension)   . Hx of cardiovascular stress test    ETT-Myoview (10/15): Nondiagnostic EKG changes, anterolateral and inferolateral scar, no ischemia, EF 48%, low risk  . Hyperlipemia   . Hyperlipidemia    NMR Lipoprofile 2008: LDL 168 ( 2410/ 1826), HDL 41, TG 71. LDL goal = <100, ideally <  70. Framingham Study LDL goal = < 130.  Marland Kitchen Kidney stones    "passed them"; Dr. Terance Hart (03/13/2016)  . Myocardial infarction ([place]) 2001  . Non Hodgkin's [facility])    [facility]  .  Pleural effusion   . TIA (transient ischemic attack) 1980s   "I was exercising when I had it"   Past Surgical History:  Procedure Laterality Date  . CARDIAC CATHETERIZATION N/A 12/22/2015   Procedure: Left Heart Cath and Coronary Angiography;  Surgeon: Sherren Mocha, MD;  Location: [place] [facility];  Service: Cardiovascular;  Laterality: N/A;  . CARDIAC CATHETERIZATION N/A 12/22/2015   Procedure: Coronary Stent Intervention;  Surgeon: Sherren Mocha, MD;  Location: [place] [facility];  Service: Cardiovascular;  Laterality: N/A;  . CARDIAC CATHETERIZATION N/A 12/22/2015   Procedure: Intravascular  Pressure Wire/FFR Study;  Surgeon: Sherren Mocha, MD;  Location: [place] [facility];  Service: Cardiovascular;  Laterality: N/A;  . CARDIAC CATHETERIZATION N/A 03/13/2016   Procedure: Left Heart Cath and Coronary Angiography;  Surgeon: Sherren Mocha, MD;  Location: [place] [facility];  Service: Cardiovascular;  Laterality: N/A;  . COLONOSCOPY  2002  . CORONARY ANGIOPLASTY WITH STENT PLACEMENT  ~ 2013   "1 stent"  . ESOPHAGOGASTRODUODENOSCOPY (EGD) WITH ESOPHAGEAL DILATION  2002  . KNEE ARTHROSCOPY Left 1990s   Family History  Problem Relation Age of Onset  . Cancer Mother        lung cancer  . Diabetes Mother   . Hyperlipidemia Mother   . Stroke Father        age 53  . Prostate cancer Father   . Cancer Sister        ?lymphoma   Social History   Socioeconomic History  . Marital status: Married    Spouse name: Not on file  . Number of children: 3  . Years of education: Not on file  . Highest education level: Not on file  Occupational History  . Not on file  Social Needs  . Financial resource strain: Not hard at all  . Food insecurity:    Worry: Never true    Inability: Never true  . Transportation needs:    Medical: No    Non-medical: No  Tobacco Use  . Smoking status: Never Smoker  . Smokeless tobacco: Never Used  Substance and Sexual Activity  . Alcohol use: No    Comment: drank some in my 20's"  . Drug use: No  . Sexual activity: Yes  Lifestyle  . Physical activity:    Days per week: 3 days    Minutes per session: 60 min  . Stress: Only a little  Relationships  . Social connections:    Talks on phone: More than three times a week    Gets together: More than three times a week    Attends religious service: More than 4 times per year    Active member of club or organization: Yes    Attends meetings of clubs or organizations: More than 4 times per year    Relationship status: Married  Other Topics Concern  . Not on file  Social History Narrative  . Not on  file   Tobacco Counseling Counseling given: Not Answered  Activities of Daily Living In your present state of health, do you have any difficulty performing the following activities: 12/05/2017  Hearing? N  Vision? N  Difficulty concentrating or making decisions? N  Walking or climbing stairs? N  Dressing or bathing? N  Doing errands, shopping? N  Preparing Food and eating ? N  Using the Toilet? N  In the past six months, have you accidently leaked urine? N  Do you have problems with loss of bowel control? N  Managing your Medications? N  Managing your Finances? N  Housekeeping or managing your Housekeeping? N  Some recent data might be hidden     Immunizations and Health Maintenance Immunization History  Administered Date(s) Administered  . Pneumococcal Conjugate-13 12/05/2017  . Pneumococcal Polysaccharide-23 08/28/2010  . Td 05/16/2006   Health Maintenance Due  Topic Date Due  . PNA vac Low Risk Adult (2 of 2 - PCV13) 08/29/2011  . TETANUS/TDAP  05/16/2016    Patient Care Team: Binnie Rail, MD as PCP - General (Internal Medicine)  Indicate any recent Medical Services you may have received from other than [facility] providers in the past year (date may be approximate).    Assessment:   This is a routine wellness examination for Austin [name]. Physical assessment deferred to PCP.   Hearing/Vision screen Hearing Screening Comments: Able to hear conversational tones w/o difficulty. No issues reported.  Passed whisper test  Vision Screening Comments: appointment yearly Dr. Venetia Maxon  Dietary issues and exercise activities discussed: Current Exercise Habits: Structured exercise class, Type of exercise: walking;calisthenics;strength training/weights, Time (Minutes): 55, Frequency (Times/Week): 3, Weekly Exercise (Minutes/Week): 165, Intensity: Moderate, Exercise limited by: None identified Diet (meal preparation, eat out, water intake, caffeinated beverages, dairy products,  fruits and vegetables): in general, a "healthy" diet  , well balanced   Reviewed heart healthy and diabetic diet, encouraged patient to increase daily water intake. Diet education was attached to patient's AVS. Relevant patient education assigned to patient using Emmi.    Goals    . LDL CALC < 70    . Patient Stated     Continue to go to the gym and exercise and modify my diet to lower my cholesterol and lower HGB A1c      Depression Screen PHQ 2/9 Scores 12/05/2017 06/14/2017 09/08/2016 10/31/2013  PHQ - 2 Score 0 0 0 0  PHQ- 9 Score 0 - - -    Fall Risk Fall Risk  12/05/2017 06/14/2017 09/08/2016 10/31/2013  Falls in the past year? No No No No   Cognitive Function:       Ad8 score reviewed for issues:  Issues making decisions: no  Less interest in hobbies / activities: no  Repeats questions, stories (family complaining): no  Trouble using ordinary gadgets (microwave, computer, phone):no  Forgets the month or year: no  Mismanaging finances: no  Remembering appts: no  Daily problems with thinking and/or memory: no Ad8 score is= 0    Screening Tests Health Maintenance  Topic Date Due  . PNA vac Low Risk Adult (2 of 2 - PCV13) 08/29/2011  . TETANUS/TDAP  05/16/2016  . INFLUENZA VACCINE  03/28/2018  . COLONOSCOPY  09/04/2020  . Hepatitis C Screening  Completed      Plan:  Continue doing brain stimulating activities (puzzles, reading, adult coloring books, staying active) to keep memory sharp.   Continue to eat heart healthy diet (full of fruits, vegetables, whole grains, lean protein, water--limit salt, fat, and sugar intake) and increase physical activity as tolerated.   I have personally reviewed and noted the following in the patient's chart:   . Medical and social history . Use of alcohol, tobacco or illicit drugs  . Current medications and supplements . Functional ability and status . Nutritional status . Physical activity . Advanced  directives . List of other physicians . Vitals . Screenings to include cognitive, depression, and falls . Referrals and appointments  In addition, I have reviewed and discussed with patient certain preventive protocols, quality  metrics, and best practice recommendations. A written personalized care plan for preventive services as well as general preventive health recommendations were provided to patient.     Michiel Cowboy, RN   12/05/2017    Medical screening examination/treatment/procedure(s) were performed by non-physician practitioner and as supervising physician I was immediately available for consultation/collaboration. I agree with above. Binnie Rail, MD

## 2017-12-10 ENCOUNTER — Other Ambulatory Visit: Payer: Self-pay | Admitting: Cardiovascular Disease

## 2017-12-28 IMAGING — CT CT CHEST W/O CM
2 of 3 series · 15 of 36 positions shown, 18 images · non-contrast
Comparison: Chest radiograph 11/11/2016

CLINICAL DATA: Fever and cough.

EXAM:
CT CHEST WITHOUT CONTRAST
TECHNIQUE: Multidetector CT imaging of the chest was performed following the
standard protocol without IV contrast.

[Series 3: thorax 2.0 · axial · 0.72mm/px · z∈[-606,-334]mm · 12 of 160 slices shown, 15 images]
[im 12/160  mediastinal]
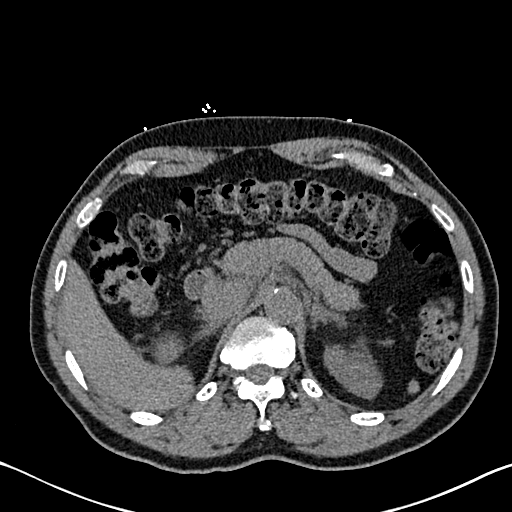
[im 12/160  lung]
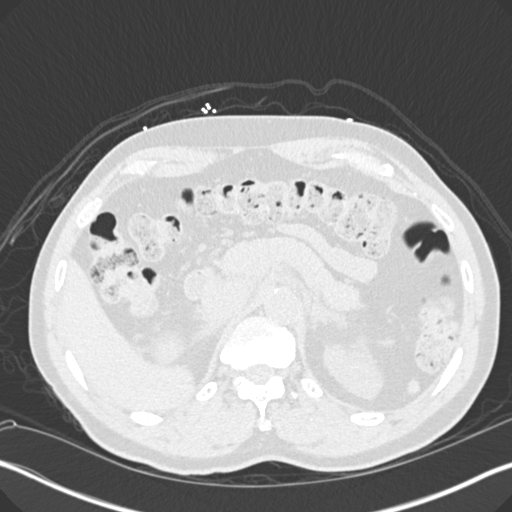
[im 24/160  lung]
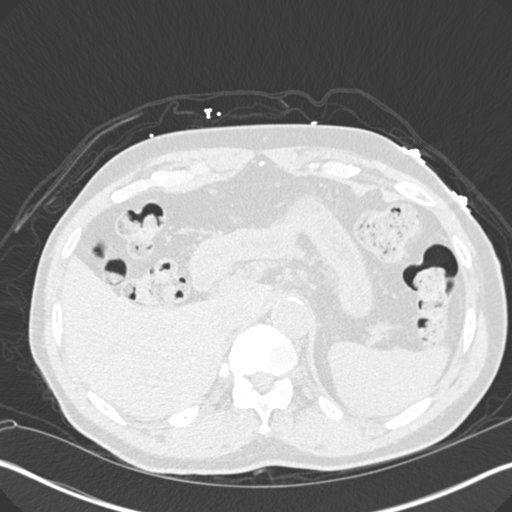
[im 36/160  lung]
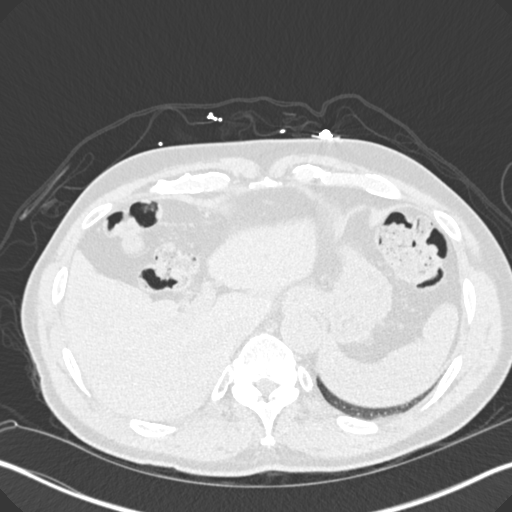
[im 48/160  lung]
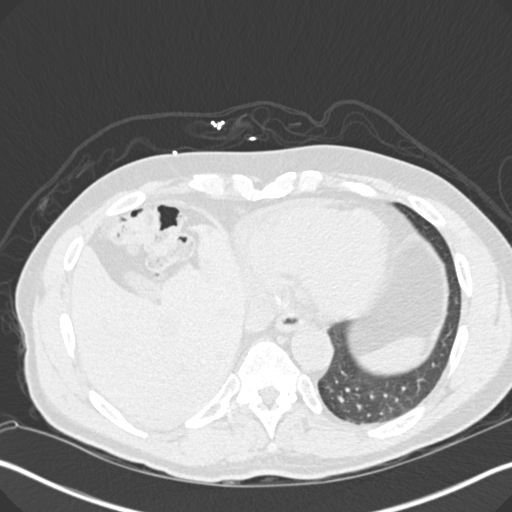
[im 59/160  mediastinal]
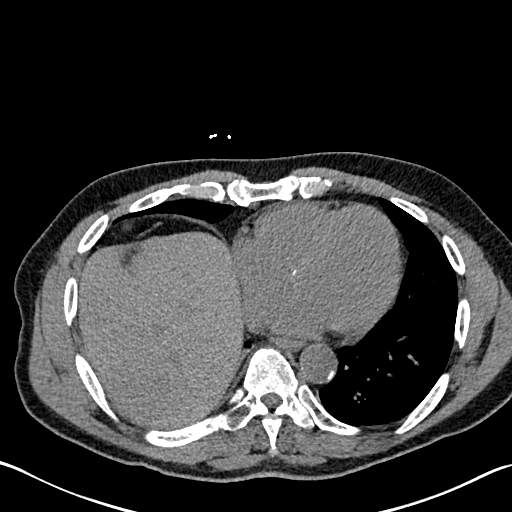
[im 59/160  lung]
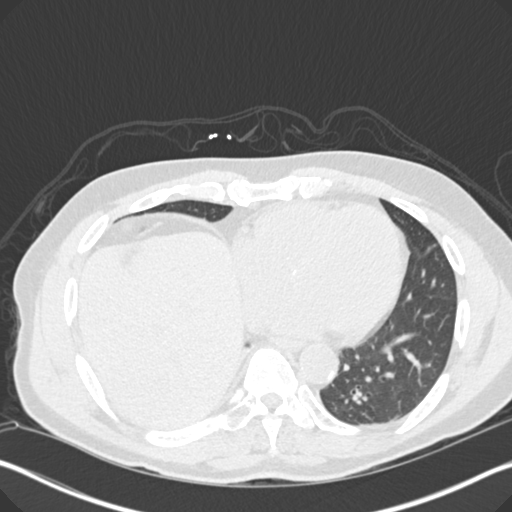
[im 71/160  lung]
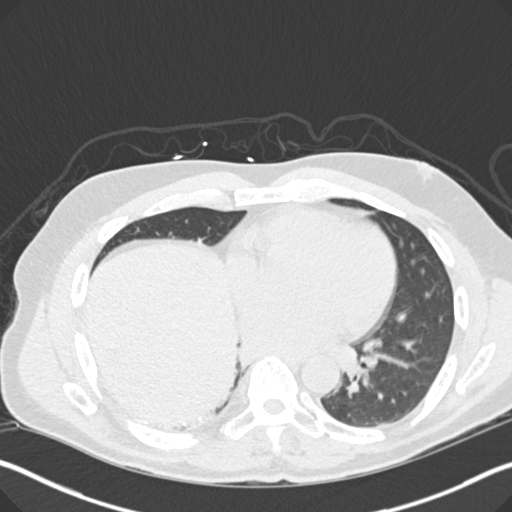
[im 89/160  lung]
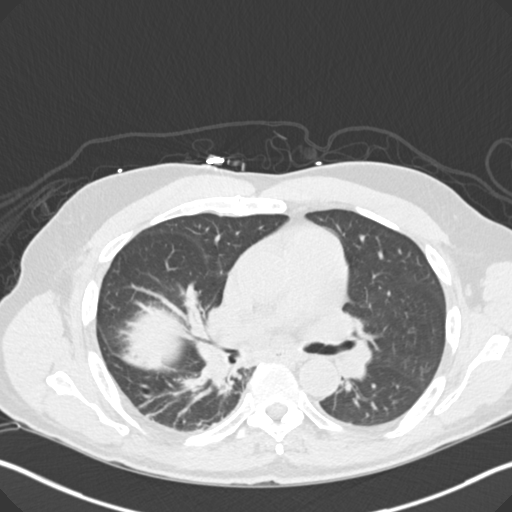
[im 101/160  lung]
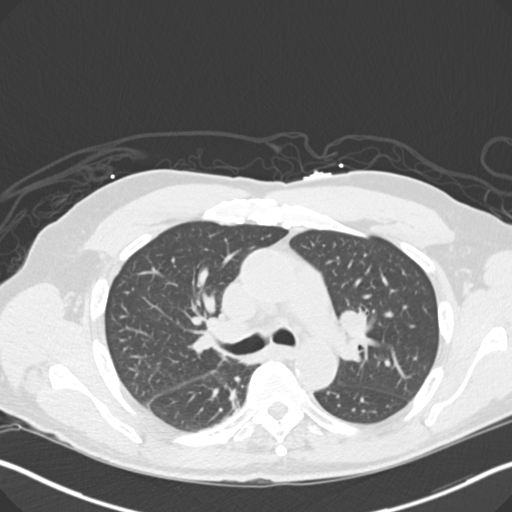
[im 112/160  mediastinal]
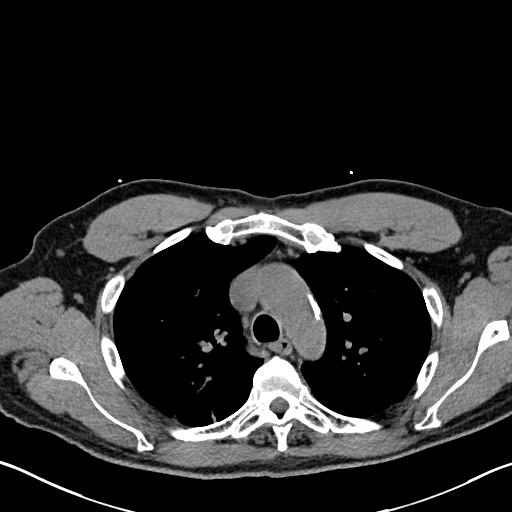
[im 112/160  lung]
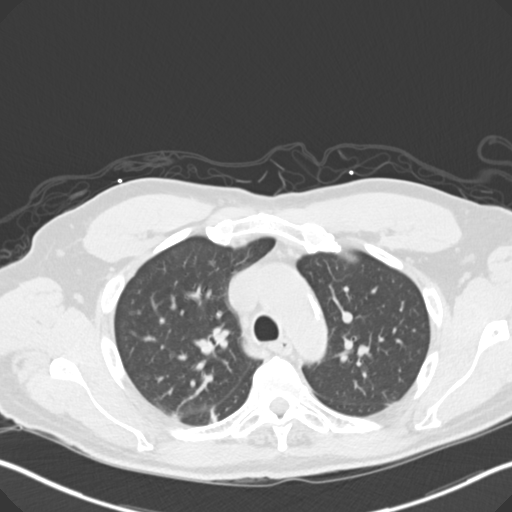
[im 124/160  lung]
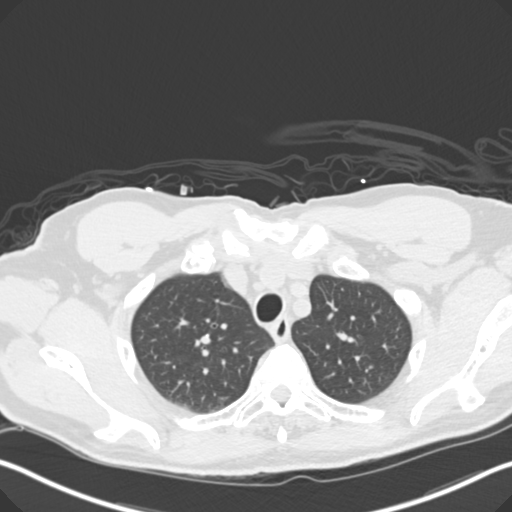
[im 136/160  lung]
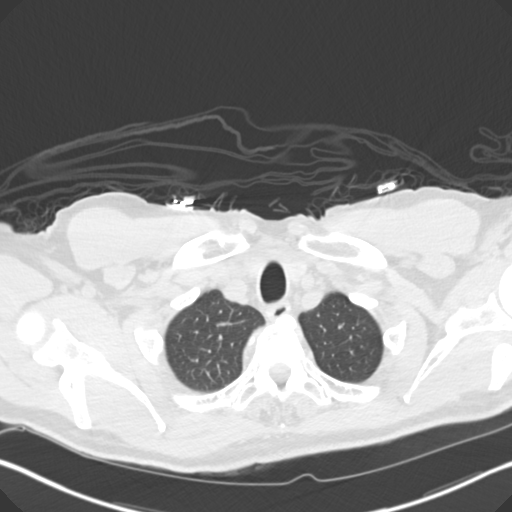
[im 148/160  lung]
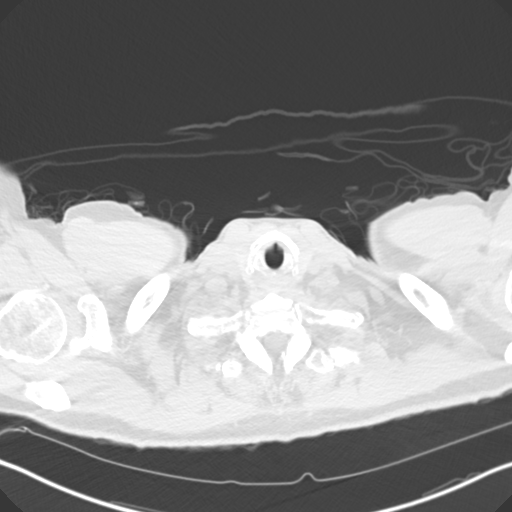

[Series 6: coronal · coronal · 0.59mm/px · 3 of 83 slices shown]
[im 17/83  lung]
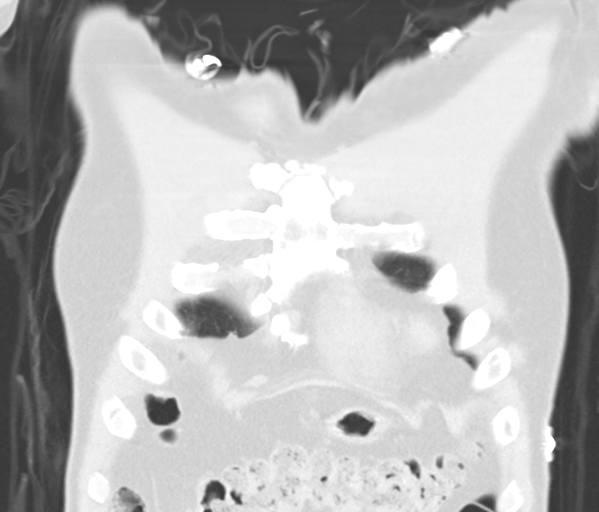
[im 33/83  lung]
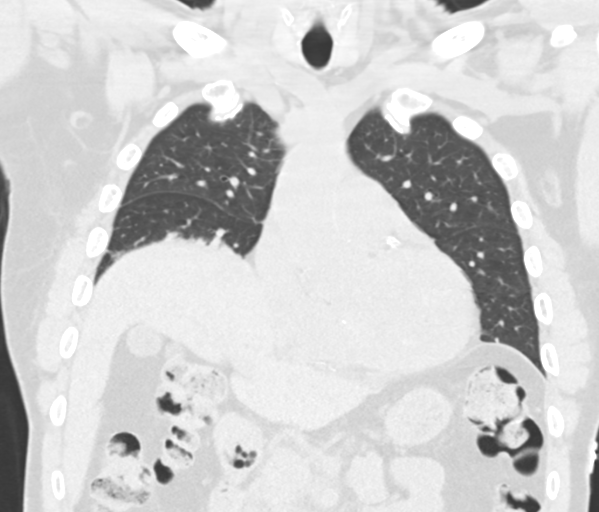
[im 50/83  lung]
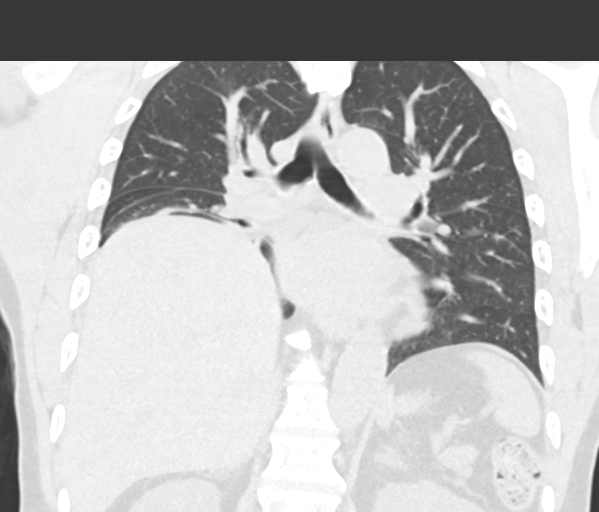

[15 of 36 positions shown; findings below may reference images not displayed]

FINDINGS: Cardiovascular: Calcific atherosclerotic disease of the coronary
arteries and aorta. Coronary stents in place. Small pericardial
effusion, likely trivial. Normal heart size.

Mediastinum/Nodes: No enlarged mediastinal or axillary lymph nodes.
Thyroid gland, trachea, and esophagus demonstrate no significant
findings.

Lungs/Pleura: Lungs are clear. No pleural effusion or pneumothorax.
Minimal right basilar atelectasis with elevation of the right
hemidiaphragm.

Upper Abdomen: No acute abnormality. 14 mm hypoattenuated lesion
within liver measures water density and therefore is considered
benign.

Musculoskeletal: No chest wall mass or suspicious bone lesions
identified.
IMPRESSION: Calcific atherosclerotic disease of the aorta and coronary arteries.

Minimal right basilar atelectasis with chronic elevation of right
hemidiaphragm.

## 2018-01-11 ENCOUNTER — Ambulatory Visit (INDEPENDENT_AMBULATORY_CARE_PROVIDER_SITE_OTHER): Payer: Medicare HMO | Admitting: Urgent Care

## 2018-01-11 ENCOUNTER — Encounter: Payer: Self-pay | Admitting: Urgent Care

## 2018-01-11 VITALS — BP 130/68 | HR 78 | Temp 99.1°F | Resp 16 | Ht 73.0 in | Wt 197.6 lb

## 2018-01-11 DIAGNOSIS — R0981 Nasal congestion: Secondary | ICD-10-CM | POA: Diagnosis not present

## 2018-01-11 DIAGNOSIS — R52 Pain, unspecified: Secondary | ICD-10-CM | POA: Diagnosis not present

## 2018-01-11 DIAGNOSIS — J029 Acute pharyngitis, unspecified: Secondary | ICD-10-CM | POA: Diagnosis not present

## 2018-01-11 DIAGNOSIS — Z20818 Contact with and (suspected) exposure to other bacterial communicable diseases: Secondary | ICD-10-CM

## 2018-01-11 MED ORDER — AMOXICILLIN 875 MG PO TABS: 875.0000 mg | ORAL_TABLET | Freq: Two times a day (BID) | ORAL | 0 refills | Status: DC

## 2018-01-11 MED ORDER — AMOXICILLIN 400 MG/5ML PO SUSR: 800.0000 mg | Freq: Two times a day (BID) | ORAL | 0 refills | Status: DC

## 2018-01-11 NOTE — Patient Instructions (Addendum)
Pharyngitis Pharyngitis is redness, pain, and swelling (inflammation) of the throat (pharynx). It is a very common cause of sore throat. Pharyngitis can be caused by a bacteria, but it is usually caused by a virus. Most cases of pharyngitis get better on their own without treatment. What are the causes? This condition may be caused by:  Infection by viruses (viral). Viral pharyngitis spreads from person to person (is contagious) through coughing, sneezing, and sharing of personal items or utensils such as cups, forks, spoons, and toothbrushes.  Infection by bacteria (bacterial). Bacterial pharyngitis may be spread by touching the nose or face after coming in contact with the bacteria, or through more intimate contact, such as kissing.  Allergies. Allergies can cause buildup of mucus in the throat (post-nasal drip), leading to inflammation and irritation. Allergies can also cause blocked nasal passages, forcing breathing through the mouth, which dries and irritates the throat.  What increases the risk? You are more likely to develop this condition if:  You are 5-24 years old.  You are exposed to crowded environments such as daycare, school, or dormitory living.  You live in a cold climate.  You have a weakened disease-fighting (immune) system.  What are the signs or symptoms? Symptoms of this condition vary by the cause (viral, bacterial, or allergies) and can include:  Sore throat.  Fatigue.  Low-grade fever.  Headache.  Joint pain and muscle aches.  Skin rashes.  Swollen glands in the throat (lymph nodes).  Plaque-like film on the throat or tonsils. This is often a symptom of bacterial pharyngitis.  Vomiting.  Stuffy nose (nasal congestion).  Cough.  Red, itchy eyes (conjunctivitis).  Loss of appetite.  How is this diagnosed? This condition is often diagnosed based on your medical history and a physical exam. Your health care provider will ask you questions about  your illness and your symptoms. A swab of your throat may be done to check for bacteria (rapid strep test). Other lab tests may also be done, depending on the suspected cause, but these are rare. How is this treated? This condition usually gets better in 3-4 days without medicine. Bacterial pharyngitis may be treated with antibiotic medicines. Follow these instructions at home:  Take over-the-counter and prescription medicines only as told by your health care provider. ? If you were prescribed an antibiotic medicine, take it as told by your health care provider. Do not stop taking the antibiotic even if you start to feel better. ? Do not give children aspirin because of the association with Reye syndrome.  Drink enough water and fluids to keep your urine clear or pale yellow.  Get a lot of rest.  Gargle with a salt-water mixture 3-4 times a day or as needed. To make a salt-water mixture, completely dissolve -1 tsp of salt in 1 cup of warm water.  If your health care provider approves, you may use throat lozenges or sprays to soothe your throat. Contact a health care provider if:  You have large, tender lumps in your neck.  You have a rash.  You cough up green, yellow-brown, or bloody spit. Get help right away if:  Your neck becomes stiff.  You drool or are unable to swallow liquids.  You cannot drink or take medicines without vomiting.  You have severe pain that does not go away, even after you take medicine.  You have trouble breathing, and it is not caused by a stuffy nose.  You have new pain and swelling in your joints   such as the knees, ankles, wrists, or elbows. Summary  Pharyngitis is redness, pain, and swelling (inflammation) of the throat (pharynx).  While pharyngitis can be caused by a bacteria, the most common causes are viral.  Most cases of pharyngitis get better on their own without treatment.  Bacterial pharyngitis is treated with antibiotic medicines. This  information is not intended to replace advice given to you by your health care provider. Make sure you discuss any questions you have with your health care provider. Document Released: 08/14/2005 Document Revised: 09/19/2016 Document Reviewed: 09/19/2016 Elsevier Interactive Patient Education  2018 [employer].     IF you received an x-ray today, you will receive an invoice from [facility]. Please contact [facility] at [phone] with questions or concerns regarding your invoice.   IF you received labwork today, you will receive an invoice from [facility]. Please contact [facility] at [phone] with questions or concerns regarding your invoice.   Our billing staff will not be able to assist you with questions regarding bills from these companies.  You will be contacted with the lab results as soon as they are available. The fastest way to get your results is to activate your My Chart account. Instructions are located on the last page of this paperwork. If you have not heard from [place] regarding the results in 2 weeks, please contact this office.

## 2018-01-11 NOTE — Progress Notes (Signed)
    MRN: [id number] DOB: December 23, 1945  Subjective:   Austin [name] is a 72 y.o. male presenting for 6 day history of persistent sore throat.  His wife was diagnosed with strep throat and treated about 4 days ago.  Patient has also had some sinus congestion and felt achy.  He has not tried any medications for relief.  Austin [name] has a current medication list which includes the following prescription(s): amlodipine, aspirin, clopidogrel, finasteride, nitroglycerin, and timolol. Also is allergic to zetia [ezetimibe]; crestor [rosuvastatin]; isosorbide mononitrate [isosorbide nitrate]; pravachol [pravastatin sodium]; and silver.  Austin [name]  has a past medical history of Allergy, Arthritis, CAP (community acquired pneumonia), Cataract, Coronary artery disease, [name] murmur, Hematuria, HTN (hypertension), cardiovascular stress test, Hyperlipemia, Hyperlipidemia, Kidney stones, Myocardial infarction ([place]) (2001), Non Hodgkin's lymphoma ([place]), Pleural effusion, and TIA (transient ischemic attack) (1980s). Also  has a past surgical history that includes Esophagogastroduodenoscopy (egd) with esophageal dilation (2002); Knee arthroscopy (Left, 1990s); Coronary angioplasty with stent (~ 2013); Cardiac catheterization (N/A, 12/22/2015); Cardiac catheterization (N/A, 12/22/2015); Cardiac catheterization (N/A, 12/22/2015); Cardiac catheterization (N/A, 03/13/2016); and Colonoscopy (2002).  Objective:   Vitals: BP 130/68   Pulse 78   Temp 99.1 F (37.3 C) (Oral)   Resp 16   Ht 6\' 1"  (1.854 m)   Wt 197 lb 9.6 oz (89.6 kg)   SpO2 98%   BMI 26.07 kg/m   Physical Exam  Constitutional: He is oriented to person, place, and time. He appears well-developed and well-nourished.  HENT:  Throat with erythema bilaterally but no exudates or tonsillar edema.  Cardiovascular: Normal rate.  Pulmonary/Chest: Effort normal.  Neurological: He is alert and oriented to person, place, and time.    Assessment and Plan :   Acute  pharyngitis, unspecified etiology  Exposure to strep throat  Aching  Sinus congestion  Patient was exposed to strep throat from his wife who was diagnosed and treated 4 days ago.  Will start patient on amoxicillin for pharyngitis. Counseled patient on potential for adverse effects with medications prescribed today, patient verbalized understanding. Return-to-clinic precautions discussed, patient verbalized understanding.   Jaynee Eagles, PA-C [facility] [place] [phone] 01/11/2018  2:43 PM

## 2018-02-13 DIAGNOSIS — H524 Presbyopia: Secondary | ICD-10-CM | POA: Diagnosis not present

## 2018-02-20 ENCOUNTER — Other Ambulatory Visit (INDEPENDENT_AMBULATORY_CARE_PROVIDER_SITE_OTHER): Payer: 59

## 2018-02-20 ENCOUNTER — Encounter: Payer: Self-pay | Admitting: Internal Medicine

## 2018-02-20 ENCOUNTER — Ambulatory Visit (INDEPENDENT_AMBULATORY_CARE_PROVIDER_SITE_OTHER): Payer: 59 | Admitting: Internal Medicine

## 2018-02-20 VITALS — BP 124/72 | HR 64 | Temp 98.2°F | Resp 16 | Ht 73.0 in | Wt 197.0 lb

## 2018-02-20 DIAGNOSIS — R739 Hyperglycemia, unspecified: Secondary | ICD-10-CM

## 2018-02-20 DIAGNOSIS — I25119 Atherosclerotic heart disease of native coronary artery with unspecified angina pectoris: Secondary | ICD-10-CM

## 2018-02-20 DIAGNOSIS — E785 Hyperlipidemia, unspecified: Secondary | ICD-10-CM

## 2018-02-20 DIAGNOSIS — I10 Essential (primary) hypertension: Secondary | ICD-10-CM

## 2018-02-20 DIAGNOSIS — Z0000 Encounter for general adult medical examination without abnormal findings: Secondary | ICD-10-CM

## 2018-02-20 DIAGNOSIS — R7303 Prediabetes: Secondary | ICD-10-CM

## 2018-02-20 DIAGNOSIS — C8290 Follicular lymphoma, unspecified, unspecified site: Secondary | ICD-10-CM

## 2018-02-20 LAB — CBC WITH DIFFERENTIAL/PLATELET
Basophils Absolute: 0.1 10*3/uL (ref 0.0–0.1)
Basophils Relative: 1.4 % (ref 0.0–3.0)
EOS ABS: 0 10*3/uL (ref 0.0–0.7)
Eosinophils Relative: 0.8 % (ref 0.0–5.0)
HEMATOCRIT: 38.5 % — AB (ref 39.0–52.0)
HEMOGLOBIN: 13.1 g/dL (ref 13.0–17.0)
Lymphocytes Relative: 20.7 % (ref 12.0–46.0)
Lymphs Abs: 1.1 10*3/uL (ref 0.7–4.0)
MCHC: 34 g/dL (ref 30.0–36.0)
MCV: 86.9 fl (ref 78.0–100.0)
Monocytes Absolute: 0.6 10*3/uL (ref 0.1–1.0)
Monocytes Relative: 11.1 % (ref 3.0–12.0)
Neutro Abs: 3.5 10*3/uL (ref 1.4–7.7)
Neutrophils Relative %: 66 % (ref 43.0–77.0)
Platelets: 255 10*3/uL (ref 150.0–400.0)
RBC: 4.43 Mil/uL (ref 4.22–5.81)
RDW: 13.2 % (ref 11.5–15.5)
WBC: 5.2 10*3/uL (ref 4.0–10.5)

## 2018-02-20 LAB — COMPREHENSIVE METABOLIC PANEL
ALT: 11 U/L (ref 0–53)
AST: 17 U/L (ref 0–37)
Albumin: 4.4 g/dL (ref 3.5–5.2)
Alkaline Phosphatase: 99 U/L (ref 39–117)
BUN: 13 mg/dL (ref 6–23)
CHLORIDE: 101 meq/L (ref 96–112)
CO2: 33 meq/L — AB (ref 19–32)
CREATININE: 0.98 mg/dL (ref 0.40–1.50)
Calcium: 10.4 mg/dL (ref 8.4–10.5)
GFR: 96.37 mL/min (ref >60.00)
Glucose, Bld: 97 mg/dL (ref 70–99)
Potassium: 4.5 mEq/L (ref 3.5–5.1)
SODIUM: 138 meq/L (ref 135–145)
Total Bilirubin: 0.6 mg/dL (ref 0.2–1.2)
Total Protein: 8 g/dL (ref 6.0–8.3)

## 2018-02-20 LAB — LIPID PANEL
CHOL/HDL RATIO: 6
Cholesterol: 255 mg/dL — ABNORMAL HIGH (ref 0–200)
HDL: 45.8 mg/dL (ref >39.00)
LDL Cholesterol: 191 mg/dL — ABNORMAL HIGH (ref 0–99)
NonHDL: 209.19
Triglycerides: 92 mg/dL (ref 0.0–149.0)
VLDL: 18.4 mg/dL (ref 0.0–40.0)

## 2018-02-20 LAB — TSH: TSH: 2.22 u[IU]/mL (ref 0.35–4.50)

## 2018-02-20 LAB — HEMOGLOBIN A1C: HEMOGLOBIN A1C: 6.1 % (ref 4.6–6.5)

## 2018-02-20 NOTE — Assessment & Plan Note (Signed)
Currently no symptoms including chest pain, shortness of breath and palpitations Taking aspirin and Plavix Blood pressure well controlled Eating healthy and doing some exercise-encouraged increasing exercise Intolerant of statins and Zetia Defer trying any other cholesterol medication Check lipid panel, CMP TSH

## 2018-02-20 NOTE — Assessment & Plan Note (Signed)
No evidence of recurrence Sees oncology twice a year

## 2018-02-20 NOTE — Assessment & Plan Note (Signed)
Blood pressure well controlled Continue current medication at current dose CMP, TSH, CBC Continue and increase regular exercise Continue healthy diet

## 2018-02-20 NOTE — Assessment & Plan Note (Signed)
Check a1c Low sugar / carb diet Stressed regular exercise   

## 2018-02-20 NOTE — Patient Instructions (Addendum)
Test(s) ordered today. Your results will be released to [place] (or called to you) after review, usually within 72hours after test completion. If any changes need to be made, you will be notified at that same time.  All other Health Maintenance issues reviewed.   All recommended immunizations and age-appropriate screenings are up-to-date or discussed.  No immunizations administered today.   Medications reviewed and updated.  No changes recommended at this time.   Please followup in 6 months    Health Maintenance, Male A healthy lifestyle and preventive care is important for your health and wellness. Ask your health care provider about what schedule of regular examinations is right for you. What should I know about weight and diet? Eat a Healthy Diet  Eat plenty of vegetables, fruits, whole grains, low-fat dairy products, and lean protein.  Do not eat a lot of foods high in solid fats, added sugars, or salt.  Maintain a Healthy Weight Regular exercise can help you achieve or maintain a healthy weight. You should:  Do at least 150 minutes of exercise each week. The exercise should increase your heart rate and make you sweat (moderate-intensity exercise).  Do strength-training exercises at least twice a week.  Watch Your Levels of Cholesterol and Blood Lipids  Have your blood tested for lipids and cholesterol every 5 years starting at 73 years of age. If you are at high risk for heart disease, you should start having your blood tested when you are 73 years old. You may need to have your cholesterol levels checked more often if: ? Your lipid or cholesterol levels are high. ? You are older than 73 years of age. ? You are at high risk for heart disease.  What should I know about cancer screening? Many types of cancers can be detected early and may often be prevented. Lung Cancer  You should be screened every year for lung cancer if: ? You are a current smoker who has smoked for  at least 30 years. ? You are a former smoker who has quit within the past 15 years.  Talk to your health care provider about your screening options, when you should start screening, and how often you should be screened.  Colorectal Cancer  Routine colorectal cancer screening usually begins at 73 years of age and should be repeated every 5-10 years until you are 73 years old. You may need to be screened more often if early forms of precancerous polyps or small growths are found. Your health care provider may recommend screening at an earlier age if you have risk factors for colon cancer.  Your health care provider may recommend using home test kits to check for hidden blood in the stool.  A small camera at the end of a tube can be used to examine your colon (sigmoidoscopy or colonoscopy). This checks for the earliest forms of colorectal cancer.  Prostate and Testicular Cancer  Depending on your age and overall health, your health care provider may do certain tests to screen for prostate and testicular cancer.  Talk to your health care provider about any symptoms or concerns you have about testicular or prostate cancer.  Skin Cancer  Check your skin from head to toe regularly.  Tell your health care provider about any new moles or changes in moles, especially if: ? There is a change in a mole's size, shape, or color. ? You have a mole that is larger than a pencil eraser.  Always use sunscreen. Apply sunscreen liberally  and repeat throughout the day.  Protect yourself by wearing long sleeves, pants, a wide-brimmed hat, and sunglasses when outside.  What should I know about heart disease, diabetes, and high blood pressure?  If you are 18-39 years of age, have your blood pressure checked every 3-5 years. If you are 40 years of age or older, have your blood pressure checked every year. You should have your blood pressure measured twice-once when you are at a hospital or clinic, and once  when you are not at a hospital or clinic. Record the average of the two measurements. To check your blood pressure when you are not at a hospital or clinic, you can use: ? An automated blood pressure machine at a pharmacy. ? A home blood pressure monitor.  Talk to your health care provider about your target blood pressure.  If you are between 45-79 years old, ask your health care provider if you should take aspirin to prevent heart disease.  Have regular diabetes screenings by checking your fasting blood sugar level. ? If you are at a normal weight and have a low risk for diabetes, have this test once every three years after the age of 45. ? If you are overweight and have a high risk for diabetes, consider being tested at a younger age or more often.  A one-time screening for abdominal aortic aneurysm (AAA) by ultrasound is recommended for men aged 65-75 years who are current or former smokers. What should I know about preventing infection? Hepatitis B If you have a higher risk for hepatitis B, you should be screened for this virus. Talk with your health care provider to find out if you are at risk for hepatitis B infection. Hepatitis C Blood testing is recommended for:  Everyone born from 1945 through 1965.  Anyone with known risk factors for hepatitis C.  Sexually Transmitted Diseases (STDs)  You should be screened each year for STDs including gonorrhea and chlamydia if: ? You are sexually active and are younger than 73 years of age. ? You are older than 73 years of age and your health care provider tells you that you are at risk for this type of infection. ? Your sexual activity has changed since you were last screened and you are at an increased risk for chlamydia or gonorrhea. Ask your health care provider if you are at risk.  Talk with your health care provider about whether you are at high risk of being infected with HIV. Your health care provider may recommend a prescription  medicine to help prevent HIV infection.  What else can I do?  Schedule regular health, dental, and eye exams.  Stay current with your vaccines (immunizations).  Do not use any tobacco products, such as cigarettes, chewing tobacco, and e-cigarettes. If you need help quitting, ask your health care provider.  Limit alcohol intake to no more than 2 drinks per day. One drink equals 12 ounces of beer, 5 ounces of wine, or 1 ounces of hard liquor.  Do not use street drugs.  Do not share needles.  Ask your health care provider for help if you need support or information about quitting drugs.  Tell your health care provider if you often feel depressed.  Tell your health care provider if you have ever been abused or do not feel safe at home. This information is not intended to replace advice given to you by your health care provider. Make sure you discuss any questions you have with your health   care provider. Document Released: 02/10/2008 Document Revised: 04/12/2016 Document Reviewed: 05/18/2015 Elsevier Interactive Patient Education  2018 Elsevier Inc.  

## 2018-02-20 NOTE — Assessment & Plan Note (Signed)
Intolerant to statins and Zetia Does not want to try any other statin Deferred other new medication for cholesterol Check lipid panel, CMP Continue heart healthy diet Has lost weight and he will maintain his weight now Increase exercise

## 2018-02-20 NOTE — Progress Notes (Signed)
Subjective:    Patient ID: Austin [name], male    DOB: 02-09-1946, 72 y.o.   MRN: [id number]  HPI He is here for a physical exam.   He had a light nose bleed recently.  He only had the one and has had them in the past.    He is eating only grilled food - no fried food.  He is exercising but not as much as he was several months ago.   He feels good and has no concerns.   Medications and allergies reviewed with patient and updated if appropriate.  Patient Active Problem List   Diagnosis Date Noted  . Syncope   . CAP (community acquired pneumonia) 08/28/2016  . Hyperglycemia 08/28/2016  . Pleural effusion 08/28/2016  . Coronary artery disease involving native coronary artery with angina pectoris ([place]) 12/13/2010  . Hyperlipidemia 11/12/2010  . Allergic rhinitis 11/12/2010  . THROMBOCYTOPENIA 12/28/2009  . 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 Essential hypertension 11/26/2008  . NEPHROLITHIASIS, HX OF 06/26/2007  . 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 amLODipine (NORVASC) 10 MG tablet TAKE 1 TABLET (10 MG TOTAL) BY MOUTH DAILY. 90 tablet 2  . aspirin 81 MG tablet Take 1 tablet (81 mg total) by mouth daily. 30 tablet   . clopidogrel (PLAVIX) 75 MG tablet TAKE 1 TABLET EVERY DAY 90 tablet 3  . finasteride (PROSCAR) 5 MG tablet TAKE 1 TABLET EVERY DAY 90 tablet 2  . nitroGLYCERIN (NITROSTAT) 0.4 MG SL tablet PLACE AND DISSOLVE 1 TABLET UNDER THE TONGUE EVERY 5 MINUTES AS NEEDED FOR CHEST PAIN 25 tablet 3  . timolol (BETIMOL) 0.25 % ophthalmic solution Place 1 drop into both eyes 2 (two) times daily.      No current facility-administered medications on file prior to visit.     Past Medical History:  Diagnosis Date  . Allergy   . Arthritis   . CAP (community acquired pneumonia)   . Cataract    bilateral  . Coronary artery disease    03/13/2016 DES to  first OM, EF 55%  . Heart murmur    "outgrew it" (03/13/2016)  . Hematuria   . HTN (hypertension)   . Hx of cardiovascular stress test    ETT-Myoview (10/15): Nondiagnostic EKG changes, anterolateral and inferolateral scar, no ischemia, EF 48%, low risk  . Hyperlipemia   . Hyperlipidemia    NMR Lipoprofile 2008: LDL 168 ( 2410/ 1826), HDL 41, TG 71. LDL goal = <100, ideally <  70. Framingham Study LDL goal = < 130.  Marland Kitchen Kidney stones    "passed them"; Dr. Terance Hart (03/13/2016)  . Myocardial infarction ([place]) 2001  . [facility]  . Pleural effusion   . TIA (transient ischemic attack) 1980s   "I was exercising when I had it"    Past Surgical History:  Procedure Laterality Date  . CARDIAC CATHETERIZATION N/A 12/22/2015   Procedure: Left Heart Cath and Coronary Angiography;  Surgeon: Sherren Mocha, MD;  Location: [facility];  Service: Cardiovascular;  Laterality: N/A;  . CARDIAC CATHETERIZATION N/A 12/22/2015   Procedure: Coronary Stent Intervention;  Surgeon: Sherren Mocha, MD;  Location: [facility];  Service: Cardiovascular;  Laterality: N/A;  . CARDIAC CATHETERIZATION N/A 12/22/2015   Procedure: Intravascular Pressure Wire/FFR Study;  Surgeon: Sherren Mocha, MD;  Location:  [facility];  Service: Cardiovascular;  Laterality: N/A;  . CARDIAC CATHETERIZATION N/A 03/13/2016   Procedure: Left Heart Cath and Coronary Angiography;  Surgeon: Sherren Mocha, MD;  Location: [facility];  Service: Cardiovascular;  Laterality: N/A;  . COLONOSCOPY  2002  . CORONARY ANGIOPLASTY WITH STENT PLACEMENT  ~ 2013   "1 stent"  . ESOPHAGOGASTRODUODENOSCOPY (EGD) WITH ESOPHAGEAL DILATION  2002  . KNEE ARTHROSCOPY Left 1990s    Social History   Socioeconomic History  . Marital status: Married    Spouse name: Not on file  . Number of children: 3  . Years of education: Not on file  . Highest education level: Not on file  Occupational  History  . Not on file  Social Needs  . Financial resource strain: Not hard at all  . Food insecurity:    Worry: Never true    Inability: Never true  . Transportation needs:    Medical: No    Non-medical: No  Tobacco Use  . Smoking status: Never Smoker  . Smokeless tobacco: Never Used  Substance and Sexual Activity  . Alcohol use: No    Comment: drank some in my 20's"  . Drug use: No  . Sexual activity: Yes  Lifestyle  . Physical activity:    Days per week: 3 days    Minutes per session: 60 min  . Stress: Only a little  Relationships  . Social connections:    Talks on phone: More than three times a week    Gets together: More than three times a week    Attends religious service: More than 4 times per year    Active member of club or organization: Yes    Attends meetings of clubs or organizations: More than 4 times per year    Relationship status: Married  Other Topics Concern  . Not on file  Social History Narrative  . Not on file    Family History  Problem Relation Age of Onset  . Cancer Mother        lung cancer  . Diabetes Mother   . Hyperlipidemia Mother   . Stroke Father        age 8  . Prostate cancer Father   . Cancer Sister        ?lymphoma    Review of Systems  Constitutional: Negative for chills, fatigue and fever.  Eyes: Negative for visual disturbance.  Respiratory: Negative for cough, shortness of breath and wheezing.   Cardiovascular: Negative for chest pain, palpitations and leg swelling.  Gastrointestinal: Negative for abdominal pain, blood in stool, constipation, diarrhea and nausea.       No gerd  Genitourinary: Negative for difficulty urinating, dysuria and hematuria.  Musculoskeletal: Negative for arthralgias and back pain.  Skin: Negative for color change and rash.  Neurological: Negative for light-headedness and headaches.  Hematological: Negative for adenopathy. Does not bruise/bleed easily.  Psychiatric/Behavioral: Negative for  dysphoric mood. The patient is not nervous/anxious.        Objective:   Vitals:   02/20/18 1058  BP: 124/72  Pulse: 64  Resp: 16  Temp: 98.2 F (36.8 C)  SpO2: 97%   Filed Weights   02/20/18 1058  Weight: 197 lb (89.4 kg)   Body mass index is 25.99 kg/m.  Wt Readings from Last 3 Encounters:  02/20/18 197 lb (89.4 kg)  01/11/18 197 lb 9.6 oz (89.6 kg)  12/05/17 207 lb (93.9 kg)     Physical  Exam Constitutional: He appears well-developed and well-nourished. No distress.  HENT:  Head: Normocephalic and atraumatic.  Right Ear: External ear normal.  Left Ear: External ear normal.  Mouth/Throat: Oropharynx is clear and moist.  Normal ear canals and TM b/l  Eyes: Conjunctivae and EOM are normal.  Neck: Neck supple. No tracheal deviation present. No thyromegaly present.  No carotid bruit  Cardiovascular: Normal rate, regular rhythm, normal heart sounds and intact distal pulses.  No murmur heard. Pulmonary/Chest: Effort normal and breath sounds normal. No respiratory distress. He has no wheezes. He has no rales.  Abdominal: Soft. He exhibits no distension. There is no tenderness.  Genitourinary: deferred  Musculoskeletal: He exhibits no edema.  Lymphadenopathy:   He has no cervical adenopathy.  Skin: Skin is warm and dry. He is not diaphoretic.  Psychiatric: He has a normal mood and affect. His behavior is normal.         Assessment & Plan:   Physical exam: Screening blood work ordered Immunizations discussed shingles and tetanus, others up-to-date Colonoscopy   up-to-date Eye exams   Up to date  EKG   done 07/2017 Exercise  Some exercise - not as much as he was-encouraged increased exercise Weight  Good for age Skin   No concerns Substance abuse    none  See Problem List for Assessment and Plan of chronic medical problems.   Follow-up in 6 months

## 2018-02-21 ENCOUNTER — Encounter: Payer: Self-pay | Admitting: Internal Medicine

## 2018-03-14 ENCOUNTER — Ambulatory Visit (INDEPENDENT_AMBULATORY_CARE_PROVIDER_SITE_OTHER): Payer: PRIVATE HEALTH INSURANCE | Admitting: Family Medicine

## 2018-03-14 ENCOUNTER — Other Ambulatory Visit (INDEPENDENT_AMBULATORY_CARE_PROVIDER_SITE_OTHER): Payer: PRIVATE HEALTH INSURANCE

## 2018-03-14 ENCOUNTER — Ambulatory Visit: Payer: Self-pay | Admitting: *Deleted

## 2018-03-14 ENCOUNTER — Telehealth: Payer: Self-pay | Admitting: Family Medicine

## 2018-03-14 ENCOUNTER — Encounter: Payer: Self-pay | Admitting: Family Medicine

## 2018-03-14 VITALS — BP 146/82 | HR 86 | Temp 98.2°F | Ht 73.0 in | Wt 195.0 lb

## 2018-03-14 DIAGNOSIS — R319 Hematuria, unspecified: Secondary | ICD-10-CM

## 2018-03-14 LAB — CBC WITH DIFFERENTIAL/PLATELET
BASOS ABS: 0 10*3/uL (ref 0.0–0.1)
Basophils Relative: 0.8 % (ref 0.0–3.0)
EOS ABS: 0.1 10*3/uL (ref 0.0–0.7)
Eosinophils Relative: 1.2 % (ref 0.0–5.0)
HEMATOCRIT: 38.1 % — AB (ref 39.0–52.0)
HEMOGLOBIN: 12.9 g/dL — AB (ref 13.0–17.0)
LYMPHS PCT: 22.6 % (ref 12.0–46.0)
Lymphs Abs: 1.2 10*3/uL (ref 0.7–4.0)
MCHC: 33.9 g/dL (ref 30.0–36.0)
MCV: 87.1 fl (ref 78.0–100.0)
MONOS PCT: 13.8 % — AB (ref 3.0–12.0)
Monocytes Absolute: 0.8 10*3/uL (ref 0.1–1.0)
Neutro Abs: 3.4 10*3/uL (ref 1.4–7.7)
Neutrophils Relative %: 61.6 % (ref 43.0–77.0)
Platelets: 250 10*3/uL (ref 150.0–400.0)
RBC: 4.37 Mil/uL (ref 4.22–5.81)
RDW: 13.1 % (ref 11.5–15.5)
WBC: 5.5 10*3/uL (ref 4.0–10.5)

## 2018-03-14 LAB — COMPREHENSIVE METABOLIC PANEL
ALT: 14 U/L (ref 0–53)
AST: 17 U/L (ref 0–37)
Albumin: 4.3 g/dL (ref 3.5–5.2)
Alkaline Phosphatase: 94 U/L (ref 39–117)
BUN: 18 mg/dL (ref 6–23)
CO2: 32 meq/L (ref 19–32)
Calcium: 10.3 mg/dL (ref 8.4–10.5)
Chloride: 102 mEq/L (ref 96–112)
Creatinine, Ser: 1.13 mg/dL (ref 0.40–1.50)
GFR: 81.75 mL/min (ref >60.00)
GLUCOSE: 88 mg/dL (ref 70–99)
Potassium: 4.6 mEq/L (ref 3.5–5.1)
SODIUM: 139 meq/L (ref 135–145)
Total Bilirubin: 0.6 mg/dL (ref 0.2–1.2)
Total Protein: 7.8 g/dL (ref 6.0–8.3)

## 2018-03-14 LAB — URINALYSIS, ROUTINE W REFLEX MICROSCOPIC
BILIRUBIN URINE: NEGATIVE
KETONES UR: NEGATIVE
Nitrite: POSITIVE — AB
SPECIFIC GRAVITY, URINE: 1.015 (ref 1.000–1.030)
Total Protein, Urine: 100 — AB
URINE GLUCOSE: 100 — AB
UROBILINOGEN UA: 2 — AB (ref 0.0–1.0)
pH: 5 (ref 5.0–8.0)

## 2018-03-14 LAB — CK: Total CK: 166 U/L (ref 7–232)

## 2018-03-14 MED ORDER — SULFAMETHOXAZOLE-TRIMETHOPRIM 800-160 MG PO TABS: 1.0000 | ORAL_TABLET | Freq: Two times a day (BID) | ORAL | 0 refills | Status: DC

## 2018-03-14 NOTE — Telephone Encounter (Signed)
Left VM for patient. If he calls back please have him speak with a nurse/CMA and inform him that his urinalysis suggests that he has an infection. His lab work looks well with a slight decrease in his hemoglobin. I will be sending in an antibiotic. The [facility] can report results to patient.   If any questions then please take the best time and phone number to call and I will try to call him back.   Rosemarie Ax, MD [place] [facility] 03/14/2018, 5:30 PM

## 2018-03-14 NOTE — Patient Instructions (Signed)
Nice to meet you  I will call you with the results from today and will determine from your lab work what to do next.

## 2018-03-14 NOTE — Assessment & Plan Note (Addendum)
Unclear at to the source. Distant history of kidney stone and had some groin pain 2-3 days ago. Possible for rhabdo with exercise and being hot out. No new sexual partners. No history of tobacco use. Personal history of cancer.  - CK, UA, urine culture, Cbc, CMP  - will call with results.

## 2018-03-14 NOTE — Telephone Encounter (Signed)
Blood in urine this morning. Testicles were tender yesterday and slightly this morning. Denies any pain/frequency/hesitancy/fever.He takes aspirin,plavix and proscar daily. Appointment made for this afternoon with Dr. Raeford Razor.  Reason for Disposition . Blood in urine  (Exception: could be normal menstrual bleeding)  Answer Assessment - Initial Assessment Questions 1. SYMPTOM: "What's the main symptom you're concerned about?" (e.g., frequency, incontinence)     Blood with voiding this morning.  2. ONSET: "When did the  Blood in urine  start?"     This morning.  3. PAIN: "Is there any pain?" If so, ask: "How bad is it?" (Scale: 1-10; mild, moderate, severe)    no 4. CAUSE: "What do you think is causing the symptoms?"     unsure 5. OTHER SYMPTOMS: "Do you have any other symptoms?" (e.g., fever, flank pain, blood in urine, pain with urination)     Testicle sore yesterday. 6. PREGNANCY: "Is there any chance you are pregnant?" "When was your last menstrual period?"     na  Protocols used: URINE - BLOOD IN-A-AH, URINARY [facility]

## 2018-03-14 NOTE — Progress Notes (Signed)
Austin [name] - 72 y.o. male MRN [id number]  Date of birth: 1945-09-23  SUBJECTIVE:  Including CC & ROS.  Chief Complaint  Patient presents with  . Hematuria    Austin [name] is a 72 y.o. male that is presenting with hematuria. Symptoms began this morning. Admits to frequent urination and burning. Noticed blood in the toilet with his urine. Lower abdominal pain present. Denies surgeries or trauma. Denies changes in medication. Denies changes in health. Denies fevers, chills or body aches. Has taken AZO this morning. Thinks he a kidney stone long ago. Had some groin pain on left left 2-3 days ago. Has been working out like normal. No changes in amount of exercise or the amount of strain. No trauma.    Review of Systems  Constitutional: Negative for fever.  HENT: Negative for congestion.   Respiratory: Negative for cough.   Cardiovascular: Negative for chest pain.  Gastrointestinal: Negative for abdominal pain.  Genitourinary: Positive for hematuria.  Musculoskeletal: Negative for back pain.  Skin: Negative for color change.  Neurological: Negative for weakness.  Hematological: Negative for adenopathy.  Psychiatric/Behavioral: Negative for agitation.    HISTORY: Past Medical, Surgical, Social, and Family History Reviewed & Updated per EMR.   Pertinent Historical Findings include:  Past Medical History:  Diagnosis Date  . Allergy   . Arthritis   . CAP (community acquired pneumonia)   . Cataract    bilateral  . Coronary artery disease    03/13/2016 DES to first OM, EF 55%  . Heart murmur    "outgrew it" (03/13/2016)  . Hematuria   . HTN (hypertension)   . Hx of cardiovascular stress test    ETT-Myoview (10/15): Nondiagnostic EKG changes, anterolateral and inferolateral scar, no ischemia, EF 48%, low risk  . Hyperlipemia   . Hyperlipidemia    NMR Lipoprofile 2008: LDL 168 ( 2410/ 1826), HDL 41, TG 71. LDL goal = <100, ideally <  70. Framingham Study LDL goal = < 130.    Marland Kitchen Kidney stones    "passed them"; Dr. Terance Hart (03/13/2016)  . Myocardial infarction ([place]) 2001  . Non Hodgkin's lymphoma [facility])    [facility]  . Pleural effusion   . TIA (transient ischemic attack) 1980s   "I was exercising when I had it"    Past Surgical History:  Procedure Laterality Date  . CARDIAC CATHETERIZATION N/A 12/22/2015   Procedure: Left Heart Cath and Coronary Angiography;  Surgeon: Sherren Mocha, MD;  Location: [facility];  Service: Cardiovascular;  Laterality: N/A;  . CARDIAC CATHETERIZATION N/A 12/22/2015   Procedure: Coronary Stent Intervention;  Surgeon: Sherren Mocha, MD;  Location: [facility];  Service: Cardiovascular;  Laterality: N/A;  . CARDIAC CATHETERIZATION N/A 12/22/2015   Procedure: Intravascular Pressure Wire/FFR Study;  Surgeon: Sherren Mocha, MD;  Location: [facility];  Service: Cardiovascular;  Laterality: N/A;  . CARDIAC CATHETERIZATION N/A 03/13/2016   Procedure: Left Heart Cath and Coronary Angiography;  Surgeon: Sherren Mocha, MD;  Location: [facility];  Service: Cardiovascular;  Laterality: N/A;  . COLONOSCOPY  2002  . CORONARY ANGIOPLASTY WITH STENT PLACEMENT  ~ 2013   "1 stent"  . ESOPHAGOGASTRODUODENOSCOPY (EGD) WITH ESOPHAGEAL DILATION  2002  . KNEE ARTHROSCOPY Left 1990s    Allergies  Allergen Reactions  . Zetia [Ezetimibe] Shortness Of Breath and Other (See Comments)    Chest pain  . Crestor [Rosuvastatin] Other (See Comments)    Myalgias  . Isosorbide Mononitrate [  Isosorbide Nitrate] Other (See Comments)    Headache, throat soreness and hoarse  . Pravachol [Pravastatin Sodium] Other (See Comments)    Myalgias  . Silver Rash    Family History  Problem Relation Age of Onset  . Cancer Mother        lung cancer  . Diabetes Mother   . Hyperlipidemia Mother   . Stroke Father        age 4  . Prostate cancer Father   . Cancer Sister        ?lymphoma     Social History   Socioeconomic  History  . Marital status: Married    Spouse name: Not on file  . Number of children: 3  . Years of education: Not on file  . Highest education level: Not on file  Occupational History  . Not on file  Social Needs  . Financial resource strain: Not hard at all  . Food insecurity:    Worry: Never true    Inability: Never true  . Transportation needs:    Medical: No    Non-medical: No  Tobacco Use  . Smoking status: Never Smoker  . Smokeless tobacco: Never Used  Substance and Sexual Activity  . Alcohol use: No    Comment: drank some in my 20's"  . Drug use: No  . Sexual activity: Yes  Lifestyle  . Physical activity:    Days per week: 3 days    Minutes per session: 60 min  . Stress: Only a little  Relationships  . Social connections:    Talks on phone: More than three times a week    Gets together: More than three times a week    Attends religious service: More than 4 times per year    Active member of club or organization: Yes    Attends meetings of clubs or organizations: More than 4 times per year    Relationship status: Married  . Intimate partner violence:    Fear of current or ex partner: No    Emotionally abused: No    Physically abused: No    Forced sexual activity: No  Other Topics Concern  . Not on file  Social History Narrative  . Not on file     PHYSICAL EXAM:  VS: BP (!) 146/82 (BP Location: Left Arm, Patient Position: Sitting, Cuff Size: Normal)   Pulse 86   Temp 98.2 F (36.8 C) (Oral)   Ht 6\' 1"  (1.854 m)   Wt 195 lb (88.5 kg)   SpO2 96%   BMI 25.73 kg/m  Physical Exam Gen: NAD, alert, cooperative with exam, well-appearing ENT: normal lips, normal nasal mucosa,  Eye: normal EOM, normal conjunctiva and lids CV:  no edema, +2 pedal pulses   Resp: no accessory muscle use, non-labored,  GI: no masses or tenderness, no hernia  GU: no inguinal LAD, no TTP of the scrotum, no scrotal swelling, no pain with retraction of foreskin Skin: no rashes,  no areas of induration  Neuro: normal tone, normal sensation to touch Psych:  normal insight, alert and oriented MSK: normal gait, normal strength       ASSESSMENT & PLAN:   Hematuria Unclear at to the source. Distant history of kidney stone and had some groin pain 2-3 days ago. Possible for rhabdo with exercise and being hot out. No new sexual partners. No history of tobacco use. Personal history of cancer.  - CK, UA, urine culture, Cbc, CMP  - will call  with results.

## 2018-03-16 LAB — URINE CULTURE
MICRO NUMBER:: 90852080
SPECIMEN QUALITY: ADEQUATE

## 2018-03-18 ENCOUNTER — Telehealth: Payer: Self-pay | Admitting: Family Medicine

## 2018-03-18 NOTE — Telephone Encounter (Signed)
Left VM for patient. If he calls back please have him speak with a nurse/CMA and inform the his urine culture grew a bacteria that should be treated with the antibiotic that I sent in. Wondering if his hematuria is still present or if it has resolved. The [facility] can report results to patient.   If any questions then please take the best time and phone number to call and I will try to call him back.   Rosemarie Ax, MD [facility] 03/18/2018, 10:53 AM

## 2018-04-05 ENCOUNTER — Ambulatory Visit: Payer: Self-pay | Admitting: *Deleted

## 2018-04-05 ENCOUNTER — Telehealth: Payer: Self-pay | Admitting: Internal Medicine

## 2018-04-05 NOTE — Telephone Encounter (Addendum)
Pt called to request a refill on his sulfamethoxazole-trimethoprim 800-160 mg tab. He was prescribed medication for 3 days. He stated that he is still having some soreness in the abdominal area and his pain is #6. He denies fever or blood in urine. He wants to know if he still needs to continue with this antibiotic.   Advised to go to urgent care if symptoms get worst. Pt voiced understanding.  Reason for Disposition . Caller has NON-URGENT medication question about med that PCP prescribed and triager unable to answer question  Answer Assessment - Initial Assessment Questions 1. SYMPTOMS: "Do you have any symptoms?"     Soreness in abd 2. SEVERITY: If symptoms are present, ask "Are they mild, moderate or severe?"     Moderate, pain #6.  Protocols used: MEDICATION QUESTION CALL-A-AH

## 2018-04-05 NOTE — Telephone Encounter (Signed)
Copied from [place] [id number]. Topic: Quick Communication - See Telephone Encounter >> Apr 05, 2018  4:09 PM Mylinda Latina, NT wrote: CRM for notification. See Telephone encounter for: 04/05/18. Patient called and states that he seen Clearance Coots last week and he states that he was prescribed  sulfamethoxazole-trimethoprim (BACTRIM DS,SEPTRA DS) 800-160 MG tablet  and he states he is out of the medication and would like a refill  [id number] [place], [place] [phone] (Phone) [phone] (Fax)

## 2018-04-08 NOTE — Telephone Encounter (Signed)
Med was prescribed by Dr Austin [name] in July.  Since he is having these symptoms he needs to be evaluated again.  Please have him make an appt.

## 2018-04-08 NOTE — Telephone Encounter (Signed)
Pt statetes it is getting better everyday. It is not liek Friday. States he took a hot bath. He will call back if the symptoms worsen

## 2018-04-08 NOTE — Telephone Encounter (Signed)
Sam, please contact Pt to schedule appt to follow-up with Dr Quay Burow if he is still having the symptoms.

## 2018-07-02 ENCOUNTER — Other Ambulatory Visit: Payer: Self-pay | Admitting: Physician Assistant

## 2018-07-30 ENCOUNTER — Other Ambulatory Visit: Payer: Self-pay | Admitting: Cardiovascular Disease

## 2018-07-31 NOTE — Progress Notes (Signed)
Cardiology Office Note:    Date:  08/01/2018   ID:  Austin [name], DOB [birth year], MRN [id number]  PCP:  Binnie Rail, MD  Cardiologist:  Sherren Mocha, MD  Electrophysiologist:  None   Referring MD: Binnie Rail, MD   Chief Complaint  Patient presents with  . Follow-up    CAD  . Chest Pain    History of Present Illness:    Austin [name] is a 72 y.o. male with CAD status post lateral wall MI in 2012 treated with PCI of the LCx with a BMS, follicular lymphoma, HTN, HL.  He underwent PCI with DES to the proximal LAD in 11/2015. He had recurrent anginal symptoms in 6/17 and failed medical therapy. Repeat cardiac catheterization demonstrated severe de novo stenosis of OM1 which was treated with a DES.  The proximal LAD stent and previous OM1 stents were both patent. Severe small vessel disease with distal PDA, diagonal branches of LAD and apical portion of the LAD was again demonstrated.  He was last seen by Dr. Burt Knack in 07/2017.    Austin [name] returns for follow-up.  Several weeks ago, he had a flu shot.  After this, he developed substernal chest soreness.  This persisted for a few weeks.  He did not have any aggravating factors or associated symptoms.  He did take nitroglycerin on one occasion with improved symptoms.  He continues to workout on a regular basis.  He has occasional chest tightness at the beginning of exercise that improves with continued activity.  This has been ongoing for years without change.  His chest soreness eventually resolved and he has not had a recurrence.  He has not not had shortness of breath, orthopnea, leg swelling or syncope.  He denies any symptoms reminiscent of his previous angina.   Prior CV studies:   The following studies were reviewed today:  Carotid US 11/12/16 Summary: Findings suggest 1-39% internal carotid artery stenosis bilaterally. Vertebral arteries are patent with antegrade flow.  Echo 1/18 EF 62-70, grade 1 diastolic  dysfunction, trivial AI, dilated aortic root (42), normal RVSF, trivial pericardial effusion  LHC7/17/17 LAD proximal stent patent, distal 90 (unchanged from previous study), lateral D1 90-unchanged from previous study; D2 90-unchanged in previous study LCx mid 100-CTO; OM1 95 (de novo), stent patent with 25 ISR RPDA ostial 50, 80 Normal EF 55 PCI: 2.75 x 18 mm Xience Alpine DES to OM1  [facility] 12/22/15 LAD prox 90%, dist 90%; D1 95% LCx - OM1 60% (The resting FFR is 0.98 and the FFR at peak hyperemia is 0.87), stent patent RCA prox 25%, dist 60%, RPDA 95% PCI 4 x 15 Resolute DES to pLAD 1. Severe proximal LAD stenosis, treated successfully with PCI using a drug-eluting stent 2. Continued patency of the stented segment in the obtuse marginal branch with moderate stenosis of that branch before the stent, negative pressure wire analysis 3. Patent RCA with mild diffuse disease 4. Significant small vessel coronary artery disease with severe stenoses of the PDA branch, diagonal subbranch, and apical LAD, all appropriate for medical therapy 5. Normal LV function by echo Recommend: Aggressive medical therapy for her residual CAD, continue dual antiplatelet therapy with aspirin and Plavix.  ETT-Echo 12/08/15 Impressions: Abnormal study after maximal exercise with reproduction of symptoms. Study suggests myocardial ischemia in the inferolateral and anteroseptal walls.  Myoview 10/15 Overall Impression: The study is abnormal. This is a low risk scan. There is mild reduction in the ejection fraction. There is a  medium-sized scar of moderate severity affecting the anterolateral wall and the mid-inferolateral wall. There is no definite ischemia. LV Ejection Fraction: 48%. LV Wall Motion: Mild hypokinesis of the lateral wall.  Echo 5/08 EF 55-65%, normal wall motion, mild aortic root dilatation, mild LAE  Past Medical History:  Diagnosis Date  . Allergy   . Arthritis   . CAP (community  acquired pneumonia)   . Cataract    bilateral  . Coronary artery disease    03/13/2016 DES to first OM, EF 55%  . Heart murmur    "outgrew it" (03/13/2016)  . Hematuria   . HTN (hypertension)   . Hx of cardiovascular stress test    ETT-Myoview (10/15): Nondiagnostic EKG changes, anterolateral and inferolateral scar, no ischemia, EF 48%, low risk  . Hyperlipemia   . Hyperlipidemia    NMR Lipoprofile 2008: LDL 168 ( 2410/ 1826), HDL 41, TG 71. LDL goal = <100, ideally <  70. Framingham Study LDL goal = < 130.  Marland Kitchen Kidney stones    "passed them"; Dr. Terance Hart (03/13/2016)  . Myocardial infarction ([place]) 2001  . Non Hodgkin's lymphoma ([place])    [facility]  . Pleural effusion   . TIA (transient ischemic attack) 1980s   "I was exercising when I had it"   Surgical Hx: The patient  has a past surgical history that includes Esophagogastroduodenoscopy (egd) with esophageal dilation (2002); Knee arthroscopy (Left, 1990s); Coronary angioplasty with stent (~ 2013); Cardiac catheterization (N/A, 12/22/2015); Cardiac catheterization (N/A, 12/22/2015); Cardiac catheterization (N/A, 12/22/2015); Cardiac catheterization (N/A, 03/13/2016); and Colonoscopy (2002).   Current Medications: Current Meds  Medication Sig  . amLODipine (NORVASC) 10 MG tablet TAKE 1 TABLET (10 MG TOTAL) BY MOUTH DAILY.  Marland Kitchen aspirin 81 MG tablet Take 1 tablet (81 mg total) by mouth daily.  . clopidogrel (PLAVIX) 75 MG tablet TAKE 1 TABLET EVERY DAY  . finasteride (PROSCAR) 5 MG tablet TAKE 1 TABLET EVERY DAY  . nitroGLYCERIN (NITROSTAT) 0.4 MG SL tablet PLACE AND DISSOLVE 1 TABLET UNDER THE TONGUE EVERY 5 MINUTES AS NEEDED FOR CHEST PAIN. Please keep upcoming appt. Thanks  . sulfamethoxazole-trimethoprim (BACTRIM DS,SEPTRA DS) 800-160 MG tablet Take 1 tablet by mouth 2 (two) times daily.  . timolol (BETIMOL) 0.25 % ophthalmic solution Place 1 drop into both eyes 2 (two) times daily.      Allergies:   Zetia [ezetimibe]; Crestor  [rosuvastatin]; Isosorbide mononitrate [isosorbide nitrate]; Pravachol [pravastatin sodium]; and Silver   Social History   Tobacco Use  . Smoking status: Never Smoker  . Smokeless tobacco: Never Used  Substance Use Topics  . Alcohol use: No    Comment: drank some in my 20's"  . Drug use: No     Family Hx: The patient's family history includes Cancer in his mother and sister; Diabetes in his mother; Hyperlipidemia in his mother; Prostate cancer in his father; Stroke in his father.  ROS:   Please see the history of present illness.    Review of Systems  Cardiovascular: Positive for chest pain.  Musculoskeletal: Positive for myalgias.   All other systems reviewed and are negative.   EKGs/Labs/Other Test Reviewed:    EKG:  EKG is  ordered today.  The ekg ordered today demonstrates sinus bradycardia, rate 57, normal axis, T wave inversion 1, aVL, QTC 387, no significant change since prior tracing  Recent Labs: 02/20/2018: TSH 2.22 03/14/2018: ALT 14; BUN 18; Creatinine, Ser 1.13; Hemoglobin 12.9; Platelets 250.0; Potassium 4.6; Sodium 139  Recent Lipid Panel Lab Results  Component Value Date/Time   CHOL 255 (H) 02/20/2018 11:57 AM   TRIG 92.0 02/20/2018 11:57 AM   HDL 45.80 02/20/2018 11:57 AM   CHOLHDL 6 02/20/2018 11:57 AM   LDLCALC 191 (H) 02/20/2018 11:57 AM   LDLDIRECT 173.2 07/21/2013 08:09 AM   From [facility] Tool Cholesterol, total 255.000 02/20/2018 HDL 45.800 02/20/2018 LDL 191.000 02/20/2018 Triglycerides 92.000 02/20/2018 A1C 6.100 02/20/2018 Hemoglobin 12.900 03/14/2018 Creatinine, Serum 1.130 03/14/2018 Potassium 4.600 03/14/2018 ALT (SGPT) 14.000 03/14/2018 TSH 2.220 02/20/2018 INR 1.000 03/09/2016 Platelets 250.000 03/14/2018  Physical Exam:    VS:  BP (!) 136/58   Pulse (!) 57   Ht 6\' 3"  (1.905 m)   Wt 203 lb (92.1 kg)   SpO2 97%   BMI 25.37 kg/m     Wt Readings from Last 3 Encounters:  08/01/18 203 lb (92.1 kg)  03/14/18 195 lb (88.5 kg)  02/20/18 197 lb  (89.4 kg)     Physical Exam  Constitutional: He is oriented to person, place, and time. He appears well-developed and well-nourished. No distress.  HENT:  Head: Normocephalic and atraumatic.  Eyes: No scleral icterus.  Neck: No JVD present. No thyromegaly present.  Cardiovascular: Normal rate and regular rhythm.  No murmur heard. Pulmonary/Chest: Effort normal. He has no wheezes. He has no rales.  Abdominal: Soft. He exhibits no distension.  Musculoskeletal: He exhibits no edema.  Lymphadenopathy:    He has no cervical adenopathy.  Neurological: He is alert and oriented to person, place, and time.  Skin: Skin is warm and dry.  Psychiatric: He has a normal mood and affect.    ASSESSMENT & PLAN:    Chest pain, unspecified type  Recent chest symptoms after a flu shot.  These were mainly nonexertional.  However, he did take nitroglycerin on one occasion with relief.  He has had some occasional exertional tightness in the past.  The pattern is overall stable without significant change.  His chest soreness has since resolved.  It has been 2 years since his last heart catheterization.  Unfortunately, his cholesterol is uncontrolled due to intolerance to Zetia and statins.  I have recommended proceeding with an exercise Myoview.  Based upon his previous anatomy (distal LAD disease, lateral D1 disease, chronically occluded LCx and distal RCA disease) I suspect his nuclear images will be abnormal.  I will mainly be looking for high risk features with large areas of ischemia in the areas of his previous stenting.  If this is unremarkable, we will continue medical therapy and see him back in 1 year.  Coronary artery disease involving native coronary artery of native heart without angina pectoris  History of prior myocardial infarction and multiple PCI procedures.  Last PCI in July 2017.  Currently, he has had episodes of chest pain.  Proceed with stress testing as noted.  Continue aspirin, Plavix,  amlodipine.  Essential hypertension The patient's blood pressure is controlled on his current regimen.  Continue current therapy.   Hyperlipidemia, unspecified hyperlipidemia type Recent LDL 191.  He is intolerant to statins and Zetia.  We discussed PCSK9 inhibitors.  He does not want to take injections.  I will try to review further with our clinical pharmacist and Dr. Debara Pickett to see if there are other alternatives/research trials to control his lipids.   Dispo:  Return in about 1 year (around 08/02/2019) for Routine Follow Up, w/ Dr. Burt Knack.   Medication Adjustments/Labs and Tests Ordered: Current medicines are reviewed at length  with the patient today.  Concerns regarding medicines are outlined above.  Tests Ordered: Orders Placed This Encounter  Procedures  . Myocardial Perfusion Imaging  . EKG 12-Lead   Medication Changes: No orders of the defined types were placed in this encounter.   Signed, Richardson Dopp, PA-C  08/01/2018 1:18 PM    [facility] [address], [place], [place]  [address] Phone: [phone]; Fax: [phone]

## 2018-08-01 ENCOUNTER — Encounter: Payer: Self-pay | Admitting: Physician Assistant

## 2018-08-01 ENCOUNTER — Encounter: Payer: Self-pay | Admitting: *Deleted

## 2018-08-01 ENCOUNTER — Ambulatory Visit (INDEPENDENT_AMBULATORY_CARE_PROVIDER_SITE_OTHER): Payer: Medicare HMO | Admitting: Physician Assistant

## 2018-08-01 ENCOUNTER — Telehealth: Payer: Self-pay | Admitting: Physician Assistant

## 2018-08-01 ENCOUNTER — Encounter

## 2018-08-01 VITALS — BP 136/58 | HR 57 | Ht 75.0 in | Wt 203.0 lb

## 2018-08-01 DIAGNOSIS — R079 Chest pain, unspecified: Secondary | ICD-10-CM

## 2018-08-01 DIAGNOSIS — E785 Hyperlipidemia, unspecified: Secondary | ICD-10-CM

## 2018-08-01 DIAGNOSIS — I2510 Atherosclerotic heart disease of native coronary artery without angina pectoris: Secondary | ICD-10-CM | POA: Diagnosis not present

## 2018-08-01 DIAGNOSIS — I10 Essential (primary) hypertension: Secondary | ICD-10-CM | POA: Diagnosis not present

## 2018-08-01 NOTE — Telephone Encounter (Addendum)
Please tell patient I discussed his cholesterol management with our pharmacist and one of our cardiologists that specializes in cholesterol management. Livalo is a newer statin that most people tolerate without muscle pain. We usually have samples of this. There is also a newer medication that will come out in the first part of next year that has fewer side effects and may be helpful. And, there is another injectable medicine that is applied in a patch for several minutes once a month.  You never see the needle. I would like to try him on Livalo and then follow up with the pharmacist in the [facility]. They can discuss the newer medication that will be approved some time next year and show him what the injectors look like for medications like Repatha and Praluent.  The patient never sees the needle and it is very easy to administer.  Once he sees the demo the pharmacist has, he may feel differently about giving himself an injection.  PLAN:  1. Start Livalo 1 mg QHS. 2. Check Lipids and LFTs in 3 mos. 3. Follow up in the Lipid Clinic with Jinny Blossom or Claiborne Billings in 3 mos (~1 week after Lipid panel is drawn). Richardson Dopp, PA-C    08/01/2018 3:37 PM

## 2018-08-01 NOTE — Patient Instructions (Signed)
Medication Instructions:  1. Your physician recommends that you continue on your current medications as directed. Please refer to the Current Medication list given to you today.  If you need a refill on your cardiac medications before your next appointment, please call your pharmacy.   Lab work: NONE ORDERED TODAY If you have labs (blood work) drawn today and your tests are completely normal, you will receive your results only by: Marland Kitchen MyChart Message (if you have MyChart) OR . A paper copy in the mail If you have any lab test that is abnormal or we need to change your treatment, we will call you to review the results.  Testing/Procedures: Your physician has requested that you have en exercise stress myoview. For further information please visit [email or web address]. Please follow instruction sheet, as given.    Follow-Up: At [facility], you and your health needs are our priority.  As part of our continuing mission to provide you with exceptional heart care, we have created designated Provider Care Teams.  These Care Teams include your primary Cardiologist (physician) and Advanced Practice Providers (APPs -  Physician Assistants and Nurse Practitioners) who all work together to provide you with the care you need, when you need it. You will need a follow up appointment in:  1 years.  Please call our office 2 months in advance to schedule this appointment.  You may see Sherren Mocha, MD or one of the following Advanced Practice Providers on your designated Care Team: Richardson Dopp, PA-C [place], [place] . Daune Perch, NP  Any Other Special Instructions Will Be Listed Below (If Applicable).

## 2018-08-02 NOTE — Telephone Encounter (Signed)
I left a message for the patient to return my call at our office.

## 2018-08-06 ENCOUNTER — Encounter (HOSPITAL_COMMUNITY): Payer: Self-pay | Admitting: *Deleted

## 2018-08-06 ENCOUNTER — Telehealth (HOSPITAL_COMMUNITY): Payer: Self-pay | Admitting: *Deleted

## 2018-08-06 NOTE — Telephone Encounter (Signed)
Left message on voicemail per DPR in reference to upcoming appointment scheduled on 08/12/18 @ 0745 with detailed instructions given per Myocardial Perfusion Study Information Sheet for the test. LM to arrive 15 minutes early, and that it is imperative to arrive on time for appointment to keep from having the test rescheduled. If you need to cancel or reschedule your appointment, please call the office within 24 hours of your appointment. Failure to do so may result in a cancellation of your appointment, and a $50 no show fee. Phone number given for call back for any questions. Ivana Nicastro, Ranae Palms

## 2018-08-07 ENCOUNTER — Telehealth: Payer: Self-pay | Admitting: Physician Assistant

## 2018-08-07 NOTE — Telephone Encounter (Signed)
Follow Up: ° ° ° °Returning your call from yesterday. °

## 2018-08-09 ENCOUNTER — Telehealth: Payer: Self-pay | Admitting: Physician Assistant

## 2018-08-09 NOTE — Telephone Encounter (Signed)
Spoke with pt who wants to call our office back after he talks with someone from a cholesterol management program (pt unsure of name) mentioned by Richardson Dopp, PA-C.

## 2018-08-09 NOTE — Telephone Encounter (Signed)
Patient has an appointment for a stress test on 12/16.  Please update him on my recommendations for his cholesterol management at that time. Richardson Dopp, PA-C    08/09/2018 9:54 AM

## 2018-08-09 NOTE — Telephone Encounter (Signed)
Spoke with pt who states that he would like to wait until after the trial program that was recommendations per Richardson Dopp, PA-C to start Livalo QHS, labs and lipid clinic in 3 months. Pt sent mychart message to get the the name of the program.

## 2018-08-09 NOTE — Telephone Encounter (Signed)
New Message    Patient is calling in reference to him beginning a medication. He did not recall the name of the medication and would like a call back. Please call to discuss.

## 2018-08-11 NOTE — Telephone Encounter (Signed)
Research RN contacted patient and will screen for [place] 4 in Jan 2020. Richardson Dopp, PA-C    08/11/2018 8:38 PM

## 2018-08-12 ENCOUNTER — Encounter: Payer: Self-pay | Admitting: Physician Assistant

## 2018-08-12 ENCOUNTER — Ambulatory Visit (HOSPITAL_COMMUNITY): Payer: Medicare HMO | Attending: Cardiology

## 2018-08-12 DIAGNOSIS — R079 Chest pain, unspecified: Secondary | ICD-10-CM | POA: Diagnosis not present

## 2018-08-12 LAB — MYOCARDIAL PERFUSION IMAGING
Estimated workload: 8.5 METS
Exercise duration (min): 7 min
LV dias vol: 89 mL (ref 62–150)
LV sys vol: 40 mL
MPHR: 147 {beats}/min
NUC STRESS TID: 0.86
Peak HR: 134 {beats}/min
Percent HR: 91 %
RPE: 18
Rest HR: 54 {beats}/min
SDS: 3
SRS: 6
SSS: 11

## 2018-08-12 MED ORDER — TECHNETIUM TC 99M TETROFOSMIN IV KIT
31.3000 | PACK | Freq: Once | INTRAVENOUS | Status: AC | PRN
  Administered 2018-08-12: 31.3 via INTRAVENOUS
  Filled 2018-08-12: qty 32

## 2018-08-12 MED ORDER — TECHNETIUM TC 99M TETROFOSMIN IV KIT
9.5000 | PACK | Freq: Once | INTRAVENOUS | Status: AC | PRN
  Administered 2018-08-12: 9.5 via INTRAVENOUS
  Filled 2018-08-12: qty 10

## 2018-09-03 ENCOUNTER — Encounter: Payer: Medicare HMO | Admitting: *Deleted

## 2018-09-03 ENCOUNTER — Ambulatory Visit: Payer: 59 | Admitting: Internal Medicine

## 2018-09-03 DIAGNOSIS — Z006 Encounter for examination for normal comparison and control in clinical research program: Secondary | ICD-10-CM

## 2018-09-06 NOTE — Research (Signed)
Late entry:  Subject to research clinic for screening visit in the [place] 4 trial, 300 mg inclisiran vs a placebo.  Subject met inclusion and exclusion criteria.  The informed consent form, study requirements and expectations were reviewed with the subject and questions and concerns were addressed prior to the signing of the consent form.  The subject verbalized understanding of the trial requirements.  The subject agreed to participate in the [place] 4 trial and signed the informed consent.  The informed consent was obtained prior to performance of any protocol-specific procedures for the subject.  A copy of the signed informed consent was given to the subject and a copy was placed in the subject's medical record.  Total cholesterol 296 by finger stick.  Subject received run in injection in clinic.  Next randomization visit scheduled.

## 2018-09-08 NOTE — Progress Notes (Addendum)
Subjective:    Patient ID: Austin [name], male    DOB: December 18, 1945, 73 y.o.   MRN: [id number]  HPI The patient is here for follow up.  CAD, Hypertension: He is taking his medication daily. He is compliant with a low sodium diet.  He was having chest pain and saw his cardiologist and work up including a stress test was normal.  He denies palpitations, edema, shortness of breath and regular headaches. He is exercising regularly.    Prediabetes:  He is compliant with a low sugar/carbohydrate diet.  He is exercising regularly.  H/o follicular lymphoma:  He sees oncology twice a year.  He feels well and denies changes in weight, fevers, chills.  His appetite is good and he is exercising regularly.   Hyperlipidemia:  He is in a study for a new drug now - getting an injection twice a year.    Medications and allergies reviewed with patient and updated if appropriate.  Patient Active Problem List   Diagnosis Date Noted  . Syncope   . Prediabetes 08/28/2016  . CAD (coronary artery disease) 12/13/2010  . Hyperlipidemia 11/12/2010  . Allergic rhinitis 11/12/2010  . 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 Hematuria 06/22/2009  . Essential hypertension 11/26/2008  . NEPHROLITHIASIS, HX OF 06/26/2007  . History of follicular lymphoma 34/28/7681    Current Outpatient Medications on File Prior to Visit  Medication Sig Dispense Refill  . amLODipine (NORVASC) 10 MG tablet TAKE 1 TABLET (10 MG TOTAL) BY MOUTH DAILY. 90 tablet 2  . aspirin 81 MG tablet Take 1 tablet (81 mg total) by mouth daily. 30 tablet   . clopidogrel (PLAVIX) 75 MG tablet TAKE 1 TABLET EVERY DAY 90 tablet 3  . finasteride (PROSCAR) 5 MG tablet TAKE 1 TABLET EVERY DAY 90 tablet 2  . nitroGLYCERIN (NITROSTAT) 0.4 MG SL tablet PLACE AND DISSOLVE 1 TABLET UNDER THE TONGUE EVERY 5 MINUTES AS NEEDED FOR CHEST PAIN. Please keep upcoming appt. Thanks 25 tablet 1  . timolol (BETIMOL) 0.25 % ophthalmic  solution Place 1 drop into both eyes 2 (two) times daily.      No current facility-administered medications on file prior to visit.     Past Medical History:  Diagnosis Date  . Allergy   . Arthritis   . CAP (community acquired pneumonia)   . Cataract    bilateral  . Coronary artery disease    03/13/2016 DES to first OM, EF 55% // Myoview 12/19: EF 55, ant-lat infarction, no significant ischemia, Low Risk  . Heart murmur    "outgrew it" (03/13/2016)  . Hematuria   . HTN (hypertension)   . Hx of cardiovascular stress test    ETT-Myoview (10/15): Nondiagnostic EKG changes, anterolateral and inferolateral scar, no ischemia, EF 48%, low risk  . Hyperlipemia   . Hyperlipidemia    NMR Lipoprofile 2008: LDL 168 ( 2410/ 1826), HDL 41, TG 71. LDL goal = <100, ideally <  70. Framingham Study LDL goal = < 130.  Marland Kitchen Kidney stones    "passed them"; Dr. Terance Hart (03/13/2016)  . Myocardial infarction ([place]) 2001  . Non Hodgkin's [facility]  . Pleural effusion   . TIA (transient ischemic attack) 1980s   "I was exercising when I had it"    Past Surgical History:  Procedure Laterality Date  . CARDIAC CATHETERIZATION N/A 12/22/2015   Procedure: Left Heart Cath and Coronary Angiography;  Surgeon:  Sherren Mocha, MD;  Location: [facility];  Service: Cardiovascular;  Laterality: N/A;  . CARDIAC CATHETERIZATION N/A 12/22/2015   Procedure: Coronary Stent Intervention;  Surgeon: Sherren Mocha, MD;  Location: [facility];  Service: Cardiovascular;  Laterality: N/A;  . CARDIAC CATHETERIZATION N/A 12/22/2015   Procedure: Intravascular Pressure Wire/FFR Study;  Surgeon: Sherren Mocha, MD;  Location: [facility];  Service: Cardiovascular;  Laterality: N/A;  . CARDIAC CATHETERIZATION N/A 03/13/2016   Procedure: Left Heart Cath and Coronary Angiography;  Surgeon: Sherren Mocha, MD;  Location: [facility];  Service: Cardiovascular;  Laterality: N/A;  .  COLONOSCOPY  2002  . CORONARY ANGIOPLASTY WITH STENT PLACEMENT  ~ 2013   "1 stent"  . ESOPHAGOGASTRODUODENOSCOPY (EGD) WITH ESOPHAGEAL DILATION  2002  . KNEE ARTHROSCOPY Left 1990s    Social History   Socioeconomic History  . Marital status: Married    Spouse name: Not on file  . Number of children: 3  . Years of education: Not on file  . Highest education level: Not on file  Occupational History  . Not on file  Social Needs  . Financial resource strain: Not hard at all  . Food insecurity:    Worry: Never true    Inability: Never true  . Transportation needs:    Medical: No    Non-medical: No  Tobacco Use  . Smoking status: Never Smoker  . Smokeless tobacco: Never Used  Substance and Sexual Activity  . Alcohol use: No    Comment: drank some in my 20's"  . Drug use: No  . Sexual activity: Yes  Lifestyle  . Physical activity:    Days per week: 3 days    Minutes per session: 60 min  . Stress: Only a little  Relationships  . Social connections:    Talks on phone: More than three times a week    Gets together: More than three times a week    Attends religious service: More than 4 times per year    Active member of club or organization: Yes    Attends meetings of clubs or organizations: More than 4 times per year    Relationship status: Married  Other Topics Concern  . Not on file  Social History Narrative  . Not on file    Family History  Problem Relation Age of Onset  . Cancer Mother        lung cancer  . Diabetes Mother   . Hyperlipidemia Mother   . Stroke Father        age 35  . Prostate cancer Father   . Cancer Sister        ?lymphoma    Review of Systems  Constitutional: Negative for chills and fever.  Respiratory: Negative for cough, shortness of breath and wheezing.   Cardiovascular: Positive for chest pain (musculoskeletal in nature). Negative for palpitations and leg swelling.  Neurological: Negative for light-headedness and headaches.         Objective:   Vitals:   09/10/18 0804  BP: 136/70  Pulse: 67  Resp: 16  Temp: 98.1 F (36.7 C)  SpO2: 98%   BP Readings from Last 3 Encounters:  09/10/18 136/70  08/01/18 (!) 136/58  03/14/18 (!) 146/82   Wt Readings from Last 3 Encounters:  09/10/18 203 lb (92.1 kg)  08/12/18 203 lb (92.1 kg)  08/01/18 203 lb (92.1 kg)   Body mass index is 25.37 kg/m.   Physical Exam  Constitutional: Appears well-developed and well-nourished. No distress.  HENT:  Head: Normocephalic and atraumatic.  Neck: Neck supple. No tracheal deviation present. No thyromegaly present.  No cervical lymphadenopathy Cardiovascular: Normal rate, regular rhythm and normal heart sounds.   No murmur heard. No carotid bruit .  No edema Pulmonary/Chest: Effort normal and breath sounds normal. No respiratory distress. No has no wheezes. No rales.  Skin: Skin is warm and dry. Not diaphoretic.  Psychiatric: Normal mood and affect. Behavior is normal.      Assessment & Plan:    See Problem List for Assessment and Plan of chronic medical problems.

## 2018-09-08 NOTE — Patient Instructions (Addendum)
  Tests ordered today. Your results will be released to MyChart (or called to you) after review, usually within 72hours after test completion. If any changes need to be made, you will be notified at that same time.  Medications reviewed and updated.  Changes include :   none      Please followup in 6 months   

## 2018-09-10 ENCOUNTER — Encounter: Payer: Self-pay | Admitting: Internal Medicine

## 2018-09-10 ENCOUNTER — Ambulatory Visit (INDEPENDENT_AMBULATORY_CARE_PROVIDER_SITE_OTHER): Payer: Medicare HMO | Admitting: Internal Medicine

## 2018-09-10 VITALS — BP 136/70 | HR 67 | Temp 98.1°F | Resp 16 | Ht 75.0 in | Wt 203.0 lb

## 2018-09-10 DIAGNOSIS — I2510 Atherosclerotic heart disease of native coronary artery without angina pectoris: Secondary | ICD-10-CM

## 2018-09-10 DIAGNOSIS — R7303 Prediabetes: Secondary | ICD-10-CM

## 2018-09-10 DIAGNOSIS — I10 Essential (primary) hypertension: Secondary | ICD-10-CM

## 2018-09-10 DIAGNOSIS — Z8572 Personal history of non-Hodgkin lymphomas: Secondary | ICD-10-CM

## 2018-09-10 DIAGNOSIS — E785 Hyperlipidemia, unspecified: Secondary | ICD-10-CM

## 2018-09-10 NOTE — Assessment & Plan Note (Addendum)
Follows with Dr Kathlen Mody Recent stress test negative Taking ASA, plavix On a study for cholesterol  Exercising, healthy diet

## 2018-09-10 NOTE — Assessment & Plan Note (Signed)
Sees oncology 2/year No evidence of recurrence

## 2018-09-10 NOTE — Assessment & Plan Note (Signed)
BP well controlled Current regimen effective and well tolerated Continue current medications at current doses cmp  

## 2018-09-10 NOTE — Assessment & Plan Note (Signed)
Check a1c Low sugar / carb diet Stressed regular exercise   

## 2018-09-10 NOTE — Assessment & Plan Note (Signed)
Currently in a study for cholesterol - getting injection every 6 months Will hold off on checking cholesterol

## 2018-09-25 ENCOUNTER — Other Ambulatory Visit: Payer: Self-pay | Admitting: Cardiovascular Disease

## 2018-10-23 ENCOUNTER — Other Ambulatory Visit: Payer: Self-pay | Admitting: Cardiovascular Disease

## 2018-10-29 ENCOUNTER — Encounter: Payer: Medicare HMO | Admitting: *Deleted

## 2018-12-09 ENCOUNTER — Ambulatory Visit: Payer: Medicare HMO

## 2018-12-19 ENCOUNTER — Other Ambulatory Visit: Payer: Self-pay | Admitting: Internal Medicine

## 2018-12-19 ENCOUNTER — Other Ambulatory Visit: Payer: Self-pay | Admitting: Cardiovascular Disease

## 2018-12-20 ENCOUNTER — Telehealth: Payer: Self-pay | Admitting: Physician Assistant

## 2018-12-20 ENCOUNTER — Telehealth: Payer: Self-pay

## 2018-12-20 NOTE — Telephone Encounter (Signed)
Lm to call back ./cy 

## 2018-12-20 NOTE — Telephone Encounter (Signed)
Patient sent message regarding chest pain through MyChart this AM. Please call to review symptoms to determine if he needs to go to the ED, seen by DOD today or set up for routine appt. Richardson Dopp, PA-C    12/20/2018 11:40 AM

## 2018-12-20 NOTE — Telephone Encounter (Signed)
Patient submitted a My Chart message informing us of some type of CP at around 10:30 am this morning 4/24.  I tried to call the patient to get more information, but the phone continued ringing without a response.  I also forwarded the message to [facility] to give them some information about the patient, which Nicki Reaper did reach out to him.  Unfortunately, we have been playing phone tag between triage and Austin [name].

## 2018-12-20 NOTE — Telephone Encounter (Signed)
Tried patient again and mailbox is now full. 4/24

## 2018-12-20 NOTE — Telephone Encounter (Signed)
Follow up  ° ° °Pt is returning call  ° ° °Please call back  °

## 2018-12-23 ENCOUNTER — Telehealth (INDEPENDENT_AMBULATORY_CARE_PROVIDER_SITE_OTHER): Payer: Medicare HMO | Admitting: Physician Assistant

## 2018-12-23 ENCOUNTER — Encounter: Payer: Self-pay | Admitting: Physician Assistant

## 2018-12-23 ENCOUNTER — Telehealth: Payer: Self-pay

## 2018-12-23 ENCOUNTER — Other Ambulatory Visit: Payer: Self-pay

## 2018-12-23 VITALS — BP 143/66 | HR 62 | Ht 73.0 in | Wt 205.0 lb

## 2018-12-23 DIAGNOSIS — E785 Hyperlipidemia, unspecified: Secondary | ICD-10-CM | POA: Diagnosis not present

## 2018-12-23 DIAGNOSIS — I2510 Atherosclerotic heart disease of native coronary artery without angina pectoris: Secondary | ICD-10-CM | POA: Diagnosis not present

## 2018-12-23 DIAGNOSIS — Z7189 Other specified counseling: Secondary | ICD-10-CM

## 2018-12-23 DIAGNOSIS — I10 Essential (primary) hypertension: Secondary | ICD-10-CM

## 2018-12-23 DIAGNOSIS — R079 Chest pain, unspecified: Secondary | ICD-10-CM

## 2018-12-23 MED ORDER — AMLODIPINE BESYLATE 10 MG PO TABS: 10.0000 mg | ORAL_TABLET | Freq: Every day | ORAL | 0 refills | Status: DC

## 2018-12-23 NOTE — Telephone Encounter (Signed)
Virtual Visit Pre-Appointment Phone Call  "Mr Uhde, I am calling you today to discuss your upcoming appointment. We are currently trying to limit exposure to the virus that causes COVID-19 by seeing patients at home rather than in the office."  1. "What is the BEST phone number to call the day of the visit?" - include this in appointment notes  2. "Do you have or have access to (through a family member/friend) a smartphone with video capability that we can use for your visit?" a. If yes - list this number in appt notes as "cell" (if different from BEST phone #) and list the appointment type as a VIDEO visit in appointment notes b. If no - list the appointment type as a PHONE visit in appointment notes  3. Confirm consent - "In the setting of the current Covid19 crisis, you are scheduled for a (phone or video) visit with your provider on (date) at (time).  Just as we do with many in-office visits, in order for you to participate in this visit, we must obtain consent.  If you'd like, I can send this to your mychart (if signed up) or email for you to review.  Otherwise, I can obtain your verbal consent now.  All virtual visits are billed to your insurance company just like a normal visit would be.  By agreeing to a virtual visit, we'd like you to understand that the technology does not allow for your provider to perform an examination, and thus may limit your provider's ability to fully assess your condition. If your provider identifies any concerns that need to be evaluated in person, we will make arrangements to do so.  Finally, though the technology is pretty good, we cannot assure that it will always work on either your or our end, and in the setting of a video visit, we may have to convert it to a phone-only visit.  In either situation, we cannot ensure that we have a secure connection.  Are you willing to proceed?" STAFF: Did the patient verbally acknowledge consent to telehealth visit?  yes  4. Advise patient to be prepared - "Two hours prior to your appointment, go ahead and check your blood pressure, pulse, oxygen saturation, and your weight (if you have the equipment to check those) and write them all down. When your visit starts, your provider will ask you for this information. If you have an Apple Watch or Kardia device, please plan to have heart rate information ready on the day of your appointment. Please have a pen and paper handy nearby the day of the visit as well."  5. Give patient instructions for MyChart download to smartphone OR Doximity/[email or web address] as below if video visit (depending on what platform provider is using)  6. Inform patient they will receive a phone call 15 minutes prior to their appointment time (may be from unknown caller ID) so they should be prepared to answer    TELEPHONE CALL NOTE  Austin [name] has been deemed a candidate for a follow-up tele-health visit to limit community exposure during the Covid-19 pandemic. I spoke with the patient via phone to ensure availability of phone/video source, confirm preferred email & phone number, and discuss instructions and expectations.  I reminded [name] to be prepared with any vital sign and/or heart rhythm information that could potentially be obtained via home monitoring, at the time of his visit. I reminded [name] to expect a phone call prior to  his visit.  Frederik Schmidt, RN 12/23/2018 3:28 PM   INSTRUCTIONS FOR DOWNLOADING THE MYCHART APP TO SMARTPHONE  - The patient must first make sure to have activated MyChart and know their login information - If Apple, go to [employer] and type in MyChart in the search bar and download the app. If Android, ask patient to go to [employer] and type in [place] in the search bar and download the app. The app is free but as with any other app downloads, their phone may require them to verify saved payment information or Apple/Android  password.  - The patient will need to then log into the app with their MyChart username and password, and select [facility] as their healthcare provider to link the account. When it is time for your visit, go to the MyChart app, find appointments, and click Begin Video Visit. Be sure to Select Allow for your device to access the Microphone and Camera for your visit. You will then be connected, and your provider will be with you shortly.  **If they have any issues connecting, or need assistance please contact MyChart service desk [phone])**  **If using a computer, in order to ensure the best quality for their visit they will need to use either of the following Internet Browsers: [employer], or Google Chrome**  IF USING DOXIMITY or DOXY.ME - The patient will receive a link just prior to their visit by text.     FULL LENGTH CONSENT FOR TELE-HEALTH VISIT   I hereby voluntarily request, consent and authorize [facility] and its employed or contracted physicians, physician assistants, nurse practitioners or other licensed health care professionals (the Practitioner), to provide me with telemedicine health care services (the "Services") as deemed necessary by the treating Practitioner. I acknowledge and consent to receive the Services by the Practitioner via telemedicine. I understand that the telemedicine visit will involve communicating with the Practitioner through live audiovisual communication technology and the disclosure of certain medical information by electronic transmission. I acknowledge that I have been given the opportunity to request an in-person assessment or other available alternative prior to the telemedicine visit and am voluntarily participating in the telemedicine visit.  I understand that I have the right to withhold or withdraw my consent to the use of telemedicine in the course of my care at any time, without affecting my right to future care or treatment,  and that the Practitioner or I may terminate the telemedicine visit at any time. I understand that I have the right to inspect all information obtained and/or recorded in the course of the telemedicine visit and may receive copies of available information for a reasonable fee.  I understand that some of the potential risks of receiving the Services via telemedicine include:  Marland Kitchen Delay or interruption in medical evaluation due to technological equipment failure or disruption; . Information transmitted may not be sufficient (e.g. poor resolution of images) to allow for appropriate medical decision making by the Practitioner; and/or  . In rare instances, security protocols could fail, causing a breach of personal health information.  Furthermore, I acknowledge that it is my responsibility to provide information about my medical history, conditions and care that is complete and accurate to the best of my ability. I acknowledge that Practitioner's advice, recommendations, and/or decision may be based on factors not within their control, such as incomplete or inaccurate data provided by me or distortions of diagnostic images or specimens that may result from electronic transmissions.  I understand that the practice of medicine is not an exact science and that Practitioner makes no warranties or guarantees regarding treatment outcomes. I acknowledge that I will receive a copy of this consent concurrently upon execution via email to the email address I last provided but may also request a printed copy by calling the office of [facility].    I understand that my insurance will be billed for this visit.   I have read or had this consent read to me. . I understand the contents of this consent, which adequately explains the benefits and risks of the Services being provided via telemedicine.  . I have been provided ample opportunity to ask questions regarding this consent and the Services and have had my questions  answered to my satisfaction. . I give my informed consent for the services to be provided through the use of telemedicine in my medical care  By participating in this telemedicine visit I agree to the above.

## 2018-12-23 NOTE — Progress Notes (Signed)
Virtual Visit via Telephone Note   This visit type was conducted due to national recommendations for restrictions regarding the COVID-19 Pandemic (e.g. social distancing) in an effort to limit this patient's exposure and mitigate transmission in our community.  Due to his co-morbid illnesses, this patient is at least at moderate risk for complications without adequate follow up.  This format is felt to be most appropriate for this patient at this time.  The patient did not have access to video technology/had technical difficulties with video requiring transitioning to audio format only (telephone).  All issues noted in this document were discussed and addressed.  No physical exam could be performed with this format.  Please refer to the patient's chart for his  consent to telehealth for [facility].   Evaluation Performed:  Follow-up visit  Date:  12/23/2018   ID:  Austin [name], DOB 1945/09/02, MRN [id number]  Patient Location: Home Provider Location: Home  PCP:  Binnie Rail, MD  Cardiologist:  Sherren Mocha, MD   Electrophysiologist:  None   Chief Complaint:  Chest pain   History of Present Illness:    Austin [name] is a 73 y.o. male with CAD status post lateral wall MI in 2012 treated with PCI of the LCx with a BMS, follicular lymphoma, HTN, HL.  He underwent PCI with DES to the proximal LAD in 11/2015. He had recurrent anginal symptoms in 6/17 and failed medical therapy. Repeat cardiac catheterization demonstrated severe de novo stenosis of OM1 which was treated with a DES.  The proximal LAD stent and previous OM1 stentswere both patent. Severe small vessel disease with distal PDA, diagonal branches of LAD and apical portion of the LAD was again demonstrated.  He was last seen in 07/2018.  He complained of some chest pain at that time.  A Myoview was arranged and this demonstrated prior anterolateral infarct but no ischemia.  He has since been enrolled in the [place] 4 trial  (Inclisiran vs Placebo).    He contacted the office recently with some complaints of chest pain.  He started to feel some tightness in his chest after mowing the yard recently.  He notes symptoms of chest discomfort with certain positions as well as lying in certain positions at night.  He exercises on a regular basis.  He walks on a treadmill for 25 minutes a day without chest discomfort or shortness of breath.  He also does several weight exercises as well as stretching.  He has not had any symptoms reminiscent of his previous angina.  He has not had syncope, paroxysmal nocturnal dyspnea or lower extremity swelling.  The patient does not have symptoms concerning for COVID-19 infection (fever, chills, cough, or new shortness of breath).    Past Medical History:  Diagnosis Date  . Allergy   . Arthritis   . CAP (community acquired pneumonia)   . Cataract    bilateral  . Coronary artery disease    03/13/2016 DES to first OM, EF 55% // Myoview 12/19: EF 55, ant-lat infarction, no significant ischemia, Low Risk  . Heart murmur    "outgrew it" (03/13/2016)  . Hematuria   . HTN (hypertension)   . Hx of cardiovascular stress test    ETT-Myoview (10/15): Nondiagnostic EKG changes, anterolateral and inferolateral scar, no ischemia, EF 48%, low risk  . Hyperlipemia   . Hyperlipidemia    NMR Lipoprofile 2008: LDL 168 ( 2410/ 1826), HDL 41, TG 71. LDL goal = <100, ideally <  33. Framingham Study LDL goal = < 130.  Marland Kitchen Kidney stones    "passed them"; Dr. Terance Hart (03/13/2016)  . Myocardial infarction ([place]) 2001  . [facility]  . Pleural effusion   . TIA (transient ischemic attack) 1980s   "I was exercising when I had it"   Past Surgical History:  Procedure Laterality Date  . CARDIAC CATHETERIZATION N/A 12/22/2015   Procedure: Left Heart Cath and Coronary Angiography;  Surgeon: Sherren Mocha, MD;  Location: [place] [facility];  Service: Cardiovascular;   Laterality: N/A;  . CARDIAC CATHETERIZATION N/A 12/22/2015   Procedure: Coronary Stent Intervention;  Surgeon: Sherren Mocha, MD;  Location: [place] [facility];  Service: Cardiovascular;  Laterality: N/A;  . CARDIAC CATHETERIZATION N/A 12/22/2015   Procedure: Intravascular Pressure Wire/FFR Study;  Surgeon: Sherren Mocha, MD;  Location: [place] [facility];  Service: Cardiovascular;  Laterality: N/A;  . CARDIAC CATHETERIZATION N/A 03/13/2016   Procedure: Left Heart Cath and Coronary Angiography;  Surgeon: Sherren Mocha, MD;  Location: [place] [facility];  Service: Cardiovascular;  Laterality: N/A;  . COLONOSCOPY  2002  . CORONARY ANGIOPLASTY WITH STENT PLACEMENT  ~ 2013   "1 stent"  . ESOPHAGOGASTRODUODENOSCOPY (EGD) WITH ESOPHAGEAL DILATION  2002  . KNEE ARTHROSCOPY Left 1990s     Current Meds  Medication Sig  . amLODipine (NORVASC) 10 MG tablet Take 1 tablet (10 mg total) by mouth daily for 30 days.  Marland Kitchen aspirin 81 MG tablet Take 1 tablet (81 mg total) by mouth daily.  . clopidogrel (PLAVIX) 75 MG tablet TAKE 1 TABLET EVERY DAY  . dorzolamide-timolol (COSOPT) 22.3-6.8 MG/ML ophthalmic solution INT 1 GTT IN OU BID  . finasteride (PROSCAR) 5 MG tablet TAKE 1 TABLET EVERY DAY  . nitroGLYCERIN (NITROSTAT) 0.4 MG SL tablet PLACE AND DISSOLVE 1 TABLET UNDER THE TONGUE EVERY 5 MINUTES AS NEEDED FOR CHEST PAIN. Please keep upcoming appt. Thanks  . [DISCONTINUED] amLODipine (NORVASC) 10 MG tablet TAKE 1 TABLET EVERY DAY     Allergies:   Zetia [ezetimibe]; Crestor [rosuvastatin]; Isosorbide mononitrate [isosorbide nitrate]; Pravachol [pravastatin sodium]; and Silver   Social History   Tobacco Use  . Smoking status: Never Smoker  . Smokeless tobacco: Never Used  Substance Use Topics  . Alcohol use: No    Comment: drank some in my 20's"  . Drug use: No     Family Hx: The patient's family history includes Cancer in his mother and sister; Diabetes in his mother; Hyperlipidemia in his mother;  Prostate cancer in his father; Stroke in his father.  ROS:   Please see the history of present illness.    All other systems reviewed and are negative.   Prior CV studies:   The following studies were reviewed today:  Myoview 08/12/18 1. EF 55%, normal wall motion.  2. Primarily fixed medium-sized, moderate intensity basal to mid anterolateral perfusion defect with some reversibility at the base.  This appears to be primarily anterolateral infarction though there surprisingly does not appear to be a significant wall motion abnormality.  3. Borderline significant near 1 mm ST depression V4 and V5 at peak exercise, resolving quickly in recovery.  Overall, I think this is a low risk study.  Most consistent with anterolateral infarction.   Carotid US 11/12/16 Summary: Findings suggest 1-39% internal carotid artery stenosis bilaterally. Vertebral arteries are patent with antegrade flow.   Echo 1/18 EF 03-47, grade 1 diastolic dysfunction, trivial AI, dilated aortic  root (42), normal RVSF, trivial pericardial effusion   LHC7/17/17 LAD proximal stent patent, distal 90 (unchanged from previous study), lateral D1 90-unchanged from previous study; D2 90-unchanged in previous study LCx mid 100-CTO; OM1 95 (de novo), stent patent with 25 ISR RPDA ostial 50, 80 Normal EF 55 PCI: 2.75 x 18 mm Xience Alpine DES to OM1   [facility] 12/22/15 LAD prox 90%, dist 90%; D1 95% LCx - OM1 60% (The resting FFR is 0.98 and the FFR at peak hyperemia is 0.87), stent patent RCA prox 25%, dist 60%, RPDA 95% PCI 4 x 15 Resolute DES to pLAD 1. Severe proximal LAD stenosis, treated successfully with PCI using a drug-eluting stent 2. Continued patency of the stented segment in the obtuse marginal branch with moderate stenosis of that branch before the stent, negative pressure wire analysis 3. Patent RCA with mild diffuse disease 4. Significant small vessel coronary artery disease with severe stenoses of the PDA branch,  diagonal subbranch, and apical LAD, all appropriate for medical therapy 5. Normal LV function by echo Recommend: Aggressive medical therapy for her residual CAD, continue dual antiplatelet therapy with aspirin and Plavix.   ETT-Echo 12/08/15 Impressions: Abnormal study after maximal exercise with reproduction of symptoms. Study suggests myocardial ischemia in the inferolateral and anteroseptal walls.   Myoview 10/15 Overall Impression:  The study is abnormal. This is a low risk scan. There is mild reduction in the ejection fraction. There is a medium-sized scar of moderate severity affecting the anterolateral wall and the mid-inferolateral wall. There is no definite ischemia.  LV Ejection Fraction: 48%.  LV Wall Motion:  Mild hypokinesis of the lateral wall.   Echo 5/08 EF 55-65%, normal wall motion, mild aortic root dilatation, mild LAE   Labs/Other Tests and Data Reviewed:    EKG:  No ECG reviewed.  Recent Labs: 02/20/2018: TSH 2.22 03/14/2018: ALT 14; BUN 18; Creatinine, Ser 1.13; Hemoglobin 12.9; Platelets 250.0; Potassium 4.6; Sodium 139   Recent Lipid Panel Lab Results  Component Value Date/Time   CHOL 255 (H) 02/20/2018 11:57 AM   TRIG 92.0 02/20/2018 11:57 AM   HDL 45.80 02/20/2018 11:57 AM   CHOLHDL 6 02/20/2018 11:57 AM   LDLCALC 191 (H) 02/20/2018 11:57 AM   LDLDIRECT 173.2 07/21/2013 08:09 AM    Wt Readings from Last 3 Encounters:  12/23/18 205 lb (93 kg)  09/10/18 203 lb (92.1 kg)  08/12/18 203 lb (92.1 kg)     Objective:    Vital Signs:  BP (!) 143/66   Pulse 62   Ht 6\' 1"  (1.854 m)   Wt 205 lb (93 kg)   BMI 27.05 kg/m    VITAL SIGNS:  reviewed GEN:  no acute distress RESPIRATORY:  No labored breathing during telephone conversation NEURO:  alert and oriented PSYCH:  in good spirits  ASSESSMENT & PLAN:    Chest pain, unspecified type His symptoms of chest discomfort sound consistent with chest wall pain.  They seem to be brought on with certain  positions.  He has not had symptoms reminiscent of his previous angina.  He does not describe exertional symptoms.  Therefore, I do not think an anti-anginal is warranted at this time.  I have asked him to continue to monitor his symptoms and to contact us if they are not improving or if they are worsening.  Coronary artery disease involving native coronary artery of native heart without angina pectoris History of prior myocardial infarction and multiple PCI procedures.  Last PCI in  July 2017.  Myoview 12/19 low risk.  As noted, his current symptoms do not sound consistent with angina.  Continue amlodipine, aspirin, clopidogrel.  He is intolerant of statins.  Essential hypertension Blood pressure somewhat elevated today.  It is usually much better than this.  Continue to monitor.  Hyperlipidemia, unspecified hyperlipidemia type He is currently enrolled in the [place] study.  COVID-19 Education: The signs and symptoms of COVID-19 were discussed with the patient and how to seek care for testing (follow up with PCP or arrange E-visit).  The importance of social distancing was discussed today.  Time:   Today, I have spent 22 minutes with the patient with telehealth technology discussing the above problems.     Medication Adjustments/Labs and Tests Ordered: Current medicines are reviewed at length with the patient today.  Concerns regarding medicines are outlined above.   Tests Ordered: No orders of the defined types were placed in this encounter.   Medication Changes: Meds ordered this encounter  Medications  . amLODipine (NORVASC) 10 MG tablet    Sig: Take 1 tablet (10 mg total) by mouth daily for 30 days.    Dispense:  30 tablet    Refill:  0    30 day supply to cover while awaiting mail order    Order Specific Question:   Supervising Provider    Answer:   Lelon Perla [[id number]]    Disposition:  Follow up in 8 month(s)  Signed, Richardson Dopp, PA-C  12/23/2018 4:25 PM    [facility]

## 2018-12-23 NOTE — Telephone Encounter (Signed)
Left the patient a message to call and give [place] an update on his chest discomfort.Marland Kitchen

## 2018-12-23 NOTE — Telephone Encounter (Signed)
I spoke to the patient this morning and he mentioned that he is still having some minimal "nagging" CP.  I arranged a Phone Virtual Visit, which he consented to for today 4/27 at 3:15 pm.  He can be reached at [phone].

## 2018-12-23 NOTE — Patient Instructions (Signed)
Medication Instructions:  No changes.  Continue the current medications.  If you need a refill on your cardiac medications before your next appointment, please call your pharmacy.   Lab work: None  If you have labs (blood work) drawn today and your tests are completely normal, you will receive your results only by: Marland Kitchen MyChart Message (if you have MyChart) OR . A paper copy in the mail If you have any lab test that is abnormal or we need to change your treatment, we will call you to review the results.  Testing/Procedures: None  Follow-Up: At [facility], you and your health needs are our priority.  As part of our continuing mission to provide you with exceptional heart care, we have created designated Provider Care Teams.  These Care Teams include your primary Cardiologist (physician) and Advanced Practice Providers (APPs -  Physician Assistants and Nurse Practitioners) who all work together to provide you with the care you need, when you need it. You will need a follow up appointment in:  8 months.  Please call our office 2 months in advance to schedule this appointment.  You may see Sherren Mocha, MD or Richardson Dopp, PA-C   Any Other Special Instructions Will Be Listed Below (If Applicable).  If your symptoms do not improve over the next few weeks or if they worsen, contact us.

## 2019-01-03 ENCOUNTER — Other Ambulatory Visit: Payer: Self-pay

## 2019-01-10 ENCOUNTER — Ambulatory Visit (INDEPENDENT_AMBULATORY_CARE_PROVIDER_SITE_OTHER): Payer: Medicare HMO | Admitting: *Deleted

## 2019-01-10 ENCOUNTER — Other Ambulatory Visit: Payer: Self-pay

## 2019-01-10 DIAGNOSIS — Z0000 Encounter for general adult medical examination without abnormal findings: Secondary | ICD-10-CM

## 2019-01-10 NOTE — Progress Notes (Addendum)
Subjective:   Austin [name] is a 73 y.o. male who presents for Medicare Annual/Subsequent preventive examination. I connected with patient by a telephone and verified that I am speaking with the correct person using two identifiers. Patient stated full name and DOB. Patient gave permission to continue with telephonic visit. Patient's location was at home and Nurse's location was at [place] office.   Review of Systems:  No ROS.  Medicare Wellness Virtual Visit.  Visual/audio telehealth visit, UTA vital signs.   See social history for additional risk factors.  Cardiac Risk Factors include: advanced age (>76men, >86 women);dyslipidemia;male gender;hypertension Sleep patterns: feels rested on waking, gets up 0-1 times nightly to void and sleeps 6-7 hours nightly.    Home Safety/Smoke Alarms: Feels safe in home. Smoke alarms in place.  Living environment; residence and Firearm Safety: 1-story house/ trailer. Lives with wife, no needs for DME, good support system Seat Belt Safety/Bike Helmet: Wears seat belt.   PSA-  Lab Results  Component Value Date   PSA 2.58 03/03/2010   PSA 7.45 (H) 12/28/2009   PSA 8.78 (H) 12/15/2009       Objective:    Vitals: There were no vitals taken for this visit.  There is no height or weight on file to calculate BMI.  Advanced Directives 01/10/2019 12/05/2017 09/04/2017 11/11/2016 09/05/2016 03/13/2016 12/22/2015  Does Patient Have a Medical Advance Directive? No No No No No Yes No  Does patient want to make changes to medical advance directive? - - - - - No - Patient declined -  Copy of [place] in Chart? - - - - - No - copy requested -  Would patient like information on creating a medical advance directive? - No - Patient declined - - No - Patient declined No - patient declined information No - patient declined information    Tobacco Social History   Tobacco Use  Smoking Status Never Smoker  Smokeless Tobacco Never Used      Counseling given: Not Answered  Past Medical History:  Diagnosis Date  . Allergy   . Arthritis   . CAP (community acquired pneumonia)   . Cataract    bilateral  . Coronary artery disease    03/13/2016 DES to first OM, EF 55% // Myoview 12/19: EF 55, ant-lat infarction, no significant ischemia, Low Risk  . Heart murmur    "outgrew it" (03/13/2016)  . Hematuria   . HTN (hypertension)   . Hx of cardiovascular stress test    ETT-Myoview (10/15): Nondiagnostic EKG changes, anterolateral and inferolateral scar, no ischemia, EF 48%, low risk  . Hyperlipemia   . Hyperlipidemia    NMR Lipoprofile 2008: LDL 168 ( 2410/ 1826), HDL 41, TG 71. LDL goal = <100, ideally <  70. Framingham Study LDL goal = < 130.  Marland Kitchen Kidney stones    "passed them"; Dr. Terance Hart (03/13/2016)  . Myocardial infarction ([place]) 2001  . Non Hodgkin's lymphoma ([place])    [facility]  . Pleural effusion   . TIA (transient ischemic attack) 1980s   "I was exercising when I had it"   Past Surgical History:  Procedure Laterality Date  . CARDIAC CATHETERIZATION N/A 12/22/2015   Procedure: Left Heart Cath and Coronary Angiography;  Surgeon: Sherren Mocha, MD;  Location: [place] [facility];  Service: Cardiovascular;  Laterality: N/A;  . CARDIAC CATHETERIZATION N/A 12/22/2015   Procedure: Coronary Stent Intervention;  Surgeon: Sherren Mocha, MD;  Location: [place] [facility];  Service: Cardiovascular;  Laterality: N/A;  . CARDIAC CATHETERIZATION N/A 12/22/2015   Procedure: Intravascular Pressure Wire/FFR Study;  Surgeon: Sherren Mocha, MD;  Location: [place] [facility];  Service: Cardiovascular;  Laterality: N/A;  . CARDIAC CATHETERIZATION N/A 03/13/2016   Procedure: Left Heart Cath and Coronary Angiography;  Surgeon: Sherren Mocha, MD;  Location: [place] [facility];  Service: Cardiovascular;  Laterality: N/A;  . COLONOSCOPY  2002  . CORONARY ANGIOPLASTY WITH STENT PLACEMENT  ~ 2013   "1 stent"  . ESOPHAGOGASTRODUODENOSCOPY  (EGD) WITH ESOPHAGEAL DILATION  2002  . KNEE ARTHROSCOPY Left 1990s   Family History  Problem Relation Age of Onset  . Cancer Mother        lung cancer  . Diabetes Mother   . Hyperlipidemia Mother   . Stroke Father        age 18  . Prostate cancer Father   . Cancer Sister        ?lymphoma   Social History   Socioeconomic History  . Marital status: Married    Spouse name: Not on file  . Number of children: 3  . Years of education: Not on file  . Highest education level: Not on file  Occupational History  . Occupation: retired Medical sales representative  . Financial resource strain: Not hard at all  . Food insecurity:    Worry: Never true    Inability: Never true  . Transportation needs:    Medical: No    Non-medical: No  Tobacco Use  . Smoking status: Never Smoker  . Smokeless tobacco: Never Used  Substance and Sexual Activity  . Alcohol use: No    Comment: drank some in my 20's"  . Drug use: No  . Sexual activity: Yes  Lifestyle  . Physical activity:    Days per week: 5 days    Minutes per session: 60 min  . Stress: Not at all  Relationships  . Social connections:    Talks on phone: More than three times a week    Gets together: More than three times a week    Attends religious service: More than 4 times per year    Active member of club or organization: Yes    Attends meetings of clubs or organizations: More than 4 times per year    Relationship status: Married  Other Topics Concern  . Not on file  Social History Narrative  . Not on file    Outpatient Encounter Medications as of 01/10/2019  Medication Sig  . amLODipine (NORVASC) 10 MG tablet Take 1 tablet (10 mg total) by mouth daily for 30 days.  Marland Kitchen aspirin 81 MG tablet Take 1 tablet (81 mg total) by mouth daily.  . clopidogrel (PLAVIX) 75 MG tablet TAKE 1 TABLET EVERY DAY  . dorzolamide-timolol (COSOPT) 22.3-6.8 MG/ML ophthalmic solution INT 1 GTT IN OU BID  . finasteride (PROSCAR) 5 MG tablet TAKE 1  TABLET EVERY DAY  . nitroGLYCERIN (NITROSTAT) 0.4 MG SL tablet PLACE AND DISSOLVE 1 TABLET UNDER THE TONGUE EVERY 5 MINUTES AS NEEDED FOR CHEST PAIN. Please keep upcoming appt. Thanks   No facility-administered encounter medications on file as of 01/10/2019.     Activities of Daily Living In your present state of health, do you have any difficulty performing the following activities: 01/10/2019  Hearing? N  Vision? N  Difficulty concentrating or making decisions? N  Walking or climbing stairs? N  Dressing or bathing? N  Doing errands, shopping? N  Preparing Food and eating ? N  Using the Toilet? N  In the past six months, have you accidently leaked urine? N  Do you have problems with loss of bowel control? N  Managing your Medications? N  Managing your Finances? N  Housekeeping or managing your Housekeeping? N  Some recent data might be hidden    Patient Care Team: Binnie Rail, MD as PCP - General (Internal Medicine) Sherren Mocha, MD as PCP - Cardiology (Cardiology)   Assessment:   This is a routine wellness examination for Austin [name]. Physical assessment deferred to PCP.  Exercise Activities and Dietary recommendations Current Exercise Habits: Home exercise routine, Type of exercise: walking;treadmill;calisthenics, Time (Minutes): 60, Frequency (Times/Week): 5, Weekly Exercise (Minutes/Week): 300, Intensity: Mild, Exercise limited by: orthopedic condition(s)  Diet (meal preparation, eat out, water intake, caffeinated beverages, dairy products, fruits and vegetables): in general, a "healthy" diet  , well balanced eats a variety of fruits and vegetables daily, limits salt, fat/cholesterol, sugar,carbohydrates,caffeine, drinks 6-8 glasses of water daily.    Goals    . LDL CALC < 70    . Patient Stated     Continue to go to the gym and exercise and modify my diet to lower my cholesterol and lower HGB A1c       Fall Risk Fall Risk  01/10/2019 09/10/2018 01/11/2018 12/05/2017  06/14/2017  Falls in the past year? 0 0 No No No  Number falls in past yr: 0 0 - - -   I Depression Screen PHQ 2/9 Scores 01/10/2019 09/10/2018 01/11/2018 12/05/2017  PHQ - 2 Score 0 0 0 0  PHQ- 9 Score - - - 0    Cognitive Function       Ad8 score reviewed for issues:  Issues making decisions: no  Less interest in hobbies / activities: no  Repeats questions, stories (family complaining): no  Trouble using ordinary gadgets (microwave, computer, phone):no  Forgets the month or year: no  Mismanaging finances: no  Remembering appts: no  Daily problems with thinking and/or memory: no Ad8 score is= 0  Immunization History  Administered Date(s) Administered  . Influenza, High Dose Seasonal PF 06/24/2018  . Influenza-Unspecified 08/23/2017, 07/04/2018  . Pneumococcal Conjugate-13 12/05/2017  . Pneumococcal Polysaccharide-23 08/28/2010  . Td 05/16/2006   Screening Tests Health Maintenance  Topic Date Due  . TETANUS/TDAP  02/21/2019 (Originally 05/16/2016)  . INFLUENZA VACCINE  03/29/2019  . COLONOSCOPY  09/04/2020  . Hepatitis C Screening  Completed  . PNA vac Low Risk Adult  Completed      Plan:    Reviewed health maintenance screenings with patient today and relevant education, vaccines, and/or referrals were provided.   Continue to eat heart healthy diet (full of fruits, vegetables, whole grains, lean protein, water--limit salt, fat, and sugar intake) and increase physical activity as tolerated.  Continue doing brain stimulating activities (puzzles, reading, adult coloring books, staying active) to keep memory sharp.   I have personally reviewed and noted the following in the patient's chart:   . Medical and social history . Use of alcohol, tobacco or illicit drugs  . Current medications and supplements . Functional ability and status . Nutritional status . Physical activity . Advanced directives . List of other physicians . Screenings to include cognitive,  depression, and falls . Referrals and appointments  In addition, I have reviewed and discussed with patient certain preventive protocols, quality metrics, and best practice recommendations. A written personalized care plan for preventive services as well as  general preventive health recommendations were provided to patient.     Michiel Cowboy, RN  01/10/2019    Medical screening examination/treatment/procedure(s) were performed by non-physician practitioner and as supervising physician I was immediately available for consultation/collaboration. I agree with above. Binnie Rail, MD

## 2019-01-14 ENCOUNTER — Ambulatory Visit: Payer: Medicare HMO

## 2019-01-31 ENCOUNTER — Telehealth: Payer: Self-pay | Admitting: *Deleted

## 2019-01-31 NOTE — Telephone Encounter (Signed)
Spoke with subject to cancel upcoming appointment due to Covid 19 restrictions.  No aes or saes to report.  Willl reschedule when clinic opens back up for face to face visits.

## 2019-03-07 ENCOUNTER — Telehealth: Payer: Self-pay | Admitting: Internal Medicine

## 2019-03-07 NOTE — Telephone Encounter (Signed)
Patient is calling back regarding his appt on Monday. He states he will call when he is outside on Monday. Thank you

## 2019-03-09 NOTE — Patient Instructions (Addendum)
  Tests ordered today. Your results will be released to [facility] (or called to you) after review.  If any changes need to be made, you will be notified at that same time.    Medications reviewed and updated.  Changes include : none      Please followup in 12 months

## 2019-03-09 NOTE — Progress Notes (Signed)
Subjective:    Patient ID: Austin [name], male    DOB: 11-07-1945, 73 y.o.   MRN: [id number]  HPI The patient is here for follow up.  He is exercising regularly.     CAD, Hypertension: He is taking his medication daily. He is compliant with a low sodium diet.  He denies chest pain, palpitations, edema, shortness of breath and regular headaches.  He does monitor his blood pressure at home - 132/80.    Prediabetes:  He is compliant with a low sugar/carbohydrate diet.  He is exercising regularly.  Hyperlipidemia: He is taking his medication daily. He is compliant with a low fat/cholesterol diet. He denies myalgias other than arthritis.    Medications and allergies reviewed with patient and updated if appropriate.  Patient Active Problem List   Diagnosis Date Noted  . Syncope   . Prediabetes 08/28/2016  . CAD (coronary artery disease) 12/13/2010  . Hyperlipidemia 11/12/2010  . Allergic rhinitis 11/12/2010  . 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 Hematuria 06/22/2009  . Essential hypertension 11/26/2008  . NEPHROLITHIASIS, HX OF 06/26/2007  . History of follicular lymphoma 78/29/5621    Current Outpatient Medications on File Prior to Visit  Medication Sig Dispense Refill  . aspirin 81 MG tablet Take 1 tablet (81 mg total) by mouth daily. 30 tablet   . clopidogrel (PLAVIX) 75 MG tablet TAKE 1 TABLET EVERY DAY 90 tablet 2  . dorzolamide-timolol (COSOPT) 22.3-6.8 MG/ML ophthalmic solution INT 1 GTT IN OU BID    . finasteride (PROSCAR) 5 MG tablet TAKE 1 TABLET EVERY DAY 90 tablet 3  . nitroGLYCERIN (NITROSTAT) 0.4 MG SL tablet PLACE AND DISSOLVE 1 TABLET UNDER THE TONGUE EVERY 5 MINUTES AS NEEDED FOR CHEST PAIN. Please keep upcoming appt. Thanks 25 tablet 1  . amLODipine (NORVASC) 10 MG tablet Take 1 tablet (10 mg total) by mouth daily for 30 days. 30 tablet 0   No current facility-administered medications on file prior to visit.     Past Medical  History:  Diagnosis Date  . Allergy   . Arthritis   . CAP (community acquired pneumonia)   . Cataract    bilateral  . Coronary artery disease    03/13/2016 DES to first OM, EF 55% // Myoview 12/19: EF 55, ant-lat infarction, no significant ischemia, Low Risk  . Heart murmur    "outgrew it" (03/13/2016)  . Hematuria   . HTN (hypertension)   . Hx of cardiovascular stress test    ETT-Myoview (10/15): Nondiagnostic EKG changes, anterolateral and inferolateral scar, no ischemia, EF 48%, low risk  . Hyperlipemia   . Hyperlipidemia    NMR Lipoprofile 2008: LDL 168 ( 2410/ 1826), HDL 41, TG 71. LDL goal = <100, ideally <  70. Framingham Study LDL goal = < 130.  Marland Kitchen Kidney stones    "passed them"; Dr. Terance Hart (03/13/2016)  . Myocardial infarction ([place]) 2001  . Non Hodgkin's lymphoma ([place])    [facility]  . Pleural effusion   . TIA (transient ischemic attack) 1980s   "I was exercising when I had it"    Past Surgical History:  Procedure Laterality Date  . CARDIAC CATHETERIZATION N/A 12/22/2015   Procedure: Left Heart Cath and Coronary Angiography;  Surgeon: Sherren Mocha, MD;  Location: [facility];  Service: Cardiovascular;  Laterality: N/A;  . CARDIAC CATHETERIZATION N/A 12/22/2015   Procedure: Coronary Stent Intervention;  Surgeon: Sherren Mocha, MD;  [facility]  INVASIVE CV LAB;  Service: Cardiovascular;  Laterality: N/A;  . CARDIAC CATHETERIZATION N/A 12/22/2015   Procedure: Intravascular Pressure Wire/FFR Study;  Surgeon: Sherren Mocha, MD;  Location: [facility];  Service: Cardiovascular;  Laterality: N/A;  . CARDIAC CATHETERIZATION N/A 03/13/2016   Procedure: Left Heart Cath and Coronary Angiography;  Surgeon: Sherren Mocha, MD;  Location: [facility];  Service: Cardiovascular;  Laterality: N/A;  . COLONOSCOPY  2002  . CORONARY ANGIOPLASTY WITH STENT PLACEMENT  ~ 2013   "1 stent"  . ESOPHAGOGASTRODUODENOSCOPY (EGD) WITH ESOPHAGEAL DILATION  2002  . KNEE  ARTHROSCOPY Left 1990s    Social History   Socioeconomic History  . Marital status: Married    Spouse name: Not on file  . Number of children: 3  . Years of education: Not on file  . Highest education level: Not on file  Occupational History  . Occupation: retired Medical sales representative  . Financial resource strain: Not hard at all  . Food insecurity    Worry: Never true    Inability: Never true  . Transportation needs    Medical: No    Non-medical: No  Tobacco Use  . Smoking status: Never Smoker  . Smokeless tobacco: Never Used  Substance and Sexual Activity  . Alcohol use: No    Comment: drank some in my 20's"  . Drug use: No  . Sexual activity: Yes  Lifestyle  . Physical activity    Days per week: 5 days    Minutes per session: 60 min  . Stress: Not at all  Relationships  . Social connections    Talks on phone: More than three times a week    Gets together: More than three times a week    Attends religious service: More than 4 times per year    Active member of club or organization: Yes    Attends meetings of clubs or organizations: More than 4 times per year    Relationship status: Married  Other Topics Concern  . Not on file  Social History Narrative  . Not on file    Family History  Problem Relation Age of Onset  . Cancer Mother        lung cancer  . Diabetes Mother   . Hyperlipidemia Mother   . Stroke Father        age 65  . Prostate cancer Father   . Cancer Sister        ?lymphoma    Review of Systems  Constitutional: Negative for chills and fever.  Respiratory: Negative for cough, shortness of breath and wheezing.   Cardiovascular: Negative for chest pain, palpitations and leg swelling.  Musculoskeletal: Positive for arthralgias.  Neurological: Positive for headaches (occ). Negative for dizziness and light-headedness.       Objective:   Vitals:   03/10/19 0824  BP: (!) 162/82  Pulse: 64  Resp: 16  Temp: 98 F (36.7 C)  SpO2: 98%    BP Readings from Last 3 Encounters:  03/10/19 (!) 162/82  12/23/18 (!) 143/66  09/10/18 136/70   Wt Readings from Last 3 Encounters:  03/10/19 199 lb (90.3 kg)  12/23/18 205 lb (93 kg)  09/10/18 203 lb (92.1 kg)   Body mass index is 26.25 kg/m.   Physical Exam    Constitutional: Appears well-developed and well-nourished. No distress.  HENT:  Head: Normocephalic and atraumatic.  Neck: Neck supple. No tracheal deviation present. No thyromegaly present.  No cervical lymphadenopathy  Cardiovascular: Normal rate, regular rhythm and normal heart sounds.   No murmur heard. No carotid bruit .  No edema Pulmonary/Chest: Effort normal and breath sounds normal. No respiratory distress. No has no wheezes. No rales.  Skin: Skin is warm and dry. Not diaphoretic.  Psychiatric: Normal mood and affect. Behavior is normal.      Assessment & Plan:    See Problem List for Assessment and Plan of chronic medical problems.

## 2019-03-10 ENCOUNTER — Ambulatory Visit (INDEPENDENT_AMBULATORY_CARE_PROVIDER_SITE_OTHER): Payer: Medicare HMO | Admitting: Internal Medicine

## 2019-03-10 ENCOUNTER — Other Ambulatory Visit: Payer: Self-pay

## 2019-03-10 ENCOUNTER — Encounter: Payer: Self-pay | Admitting: Internal Medicine

## 2019-03-10 VITALS — BP 164/80 | HR 64 | Temp 98.0°F | Resp 16 | Ht 73.0 in | Wt 199.0 lb

## 2019-03-10 DIAGNOSIS — R7303 Prediabetes: Secondary | ICD-10-CM | POA: Diagnosis not present

## 2019-03-10 DIAGNOSIS — I2510 Atherosclerotic heart disease of native coronary artery without angina pectoris: Secondary | ICD-10-CM

## 2019-03-10 DIAGNOSIS — E785 Hyperlipidemia, unspecified: Secondary | ICD-10-CM | POA: Diagnosis not present

## 2019-03-10 DIAGNOSIS — I10 Essential (primary) hypertension: Secondary | ICD-10-CM

## 2019-03-10 NOTE — Assessment & Plan Note (Signed)
No concerning symptoms Following with cardiology Continue current medication On cholesterol trial with cardiology Cmp

## 2019-03-10 NOTE — Assessment & Plan Note (Signed)
Elevated here today - will repeat before he leaves Well controlled at home He did not take his BP med this am Bring cuff to next appt to calibrate Continue current medication cmp

## 2019-03-10 NOTE — Assessment & Plan Note (Signed)
Check a1c Low sugar / carb diet Stressed regular exercise   

## 2019-03-10 NOTE — Telephone Encounter (Signed)
Noted  

## 2019-03-10 NOTE — Assessment & Plan Note (Signed)
Orion- 4 trial with cardiology Did not tolerate statins, zetia Will hold off on checking lipids - will let cardiology do it given study

## 2019-04-17 ENCOUNTER — Other Ambulatory Visit: Payer: Self-pay

## 2019-04-17 ENCOUNTER — Telehealth: Payer: Medicare HMO

## 2019-04-17 ENCOUNTER — Emergency Department (HOSPITAL_COMMUNITY): Payer: Medicare HMO

## 2019-04-17 ENCOUNTER — Emergency Department (HOSPITAL_COMMUNITY)
Admission: EM | Admit: 2019-04-17 | Discharge: 2019-04-17 | Disposition: A | Discharge status: Home / Self Care | Payer: Medicare HMO | Attending: Emergency Medicine | Admitting: Emergency Medicine

## 2019-04-17 DIAGNOSIS — Z87442 Personal history of urinary calculi: Secondary | ICD-10-CM | POA: Diagnosis not present

## 2019-04-17 DIAGNOSIS — I10 Essential (primary) hypertension: Secondary | ICD-10-CM | POA: Diagnosis not present

## 2019-04-17 DIAGNOSIS — Z7901 Long term (current) use of anticoagulants: Secondary | ICD-10-CM | POA: Diagnosis not present

## 2019-04-17 DIAGNOSIS — R001 Bradycardia, unspecified: Secondary | ICD-10-CM | POA: Diagnosis not present

## 2019-04-17 DIAGNOSIS — R079 Chest pain, unspecified: Secondary | ICD-10-CM | POA: Diagnosis not present

## 2019-04-17 DIAGNOSIS — Z7982 Long term (current) use of aspirin: Secondary | ICD-10-CM | POA: Diagnosis not present

## 2019-04-17 DIAGNOSIS — R072 Precordial pain: Secondary | ICD-10-CM | POA: Diagnosis not present

## 2019-04-17 DIAGNOSIS — Z79899 Other long term (current) drug therapy: Secondary | ICD-10-CM | POA: Insufficient documentation

## 2019-04-17 DIAGNOSIS — I252 Old myocardial infarction: Secondary | ICD-10-CM | POA: Insufficient documentation

## 2019-04-17 DIAGNOSIS — I2510 Atherosclerotic heart disease of native coronary artery without angina pectoris: Secondary | ICD-10-CM | POA: Diagnosis not present

## 2019-04-17 LAB — BASIC METABOLIC PANEL
Anion gap: 8 (ref 5–15)
BUN: 15 mg/dL (ref 8–23)
CO2: 29 mmol/L (ref 22–32)
Calcium: 10.1 mg/dL (ref 8.9–10.3)
Chloride: 104 mmol/L (ref 98–111)
Creatinine, Ser: 1.12 mg/dL (ref 0.61–1.24)
GFR calc Af Amer: >60 mL/min (ref >60)
GFR calc non Af Amer: >60 mL/min (ref >60)
Glucose, Bld: 95 mg/dL (ref 70–99)
Potassium: 4.4 mmol/L (ref 3.5–5.1)
Sodium: 141 mmol/L (ref 135–145)

## 2019-04-17 LAB — CBC
HCT: 40 % (ref 39.0–52.0)
Hemoglobin: 13.3 g/dL (ref 13.0–17.0)
MCH: 29.8 pg (ref 26.0–34.0)
MCHC: 33.3 g/dL (ref 30.0–36.0)
MCV: 89.5 fL (ref 80.0–100.0)
Platelets: 255 10*3/uL (ref 150–400)
RBC: 4.47 MIL/uL (ref 4.22–5.81)
RDW: 12.6 % (ref 11.5–15.5)
WBC: 4.5 10*3/uL (ref 4.0–10.5)
nRBC: 0 % (ref 0.0–0.2)

## 2019-04-17 LAB — TROPONIN I (HIGH SENSITIVITY)
Troponin I (High Sensitivity): 11 ng/L (ref <18)
Troponin I (High Sensitivity): 13 ng/L (ref <18)

## 2019-04-17 MED ORDER — SODIUM CHLORIDE 0.9% FLUSH: 3.0000 mL | Freq: Once | INTRAVENOUS | Status: DC

## 2019-04-17 NOTE — Discharge Instructions (Addendum)
Make an appointment to follow-up with cardiology.  Return for any chest pain is more severe lasting 15 minutes or longer.

## 2019-04-17 NOTE — ED Triage Notes (Signed)
Patient reports L-sided CP x 1-2 weeks, worse with certain movements, intermittent, and sore and tight in nature. Hx 2 MIs (2012 and 2017 with cardiac caths and stents), but doesn't know if this feels the same. He denies shortness of breath, dizziness, N/V. Resp e/u, skin w/d.

## 2019-04-17 NOTE — ED Provider Notes (Signed)
[facility] EMERGENCY DEPARTMENT Provider Note   CSN: [id number] Arrival date & time: 04/17/19  1545     History   Chief Complaint Chief Complaint  Patient presents with  . Chest Pain    HPI Austin [name] is a 73 y.o. male.     Patient coming in with left-sided chest pain on and off for the past 1 to 2 weeks.  Intermittent.  Mostly due to movement of the left arm but occasionally gets discomfort in both sides of the upper chest without movement of the arm.  Patient however does workout on a treadmill 3 times a week.  For 30 minutes.  Last done 2 days ago.  Had no chest pain with that.  Has not had any chest pain on the treadmill at all.  Is got a history of coronary artery disease he has had cardiac stents placed in 2012 and 2017.  Not associated with any shortness of breath dizziness nausea vomiting no fevers no upper respiratory symptoms.  Patient is followed by Dr. Burt Knack from cardiology.  Oxygen saturations here is 100% on room air.     Past Medical History:  Diagnosis Date  . Allergy   . Arthritis   . CAP (community acquired pneumonia)   . Cataract    bilateral  . Coronary artery disease    03/13/2016 DES to first OM, EF 55% // Myoview 12/19: EF 55, ant-lat infarction, no significant ischemia, Low Risk  . Heart murmur    "outgrew it" (03/13/2016)  . Hematuria   . HTN (hypertension)   . Hx of cardiovascular stress test    ETT-Myoview (10/15): Nondiagnostic EKG changes, anterolateral and inferolateral scar, no ischemia, EF 48%, low risk  . Hyperlipemia   . Hyperlipidemia    NMR Lipoprofile 2008: LDL 168 ( 2410/ 1826), HDL 41, TG 71. LDL goal = <100, ideally <  70. Framingham Study LDL goal = < 130.  Marland Kitchen Kidney stones    "passed them"; Dr. Terance Hart (03/13/2016)  . Myocardial infarction ([place]) 2001  . Non Hodgkin's lymphoma [facility])    [facility]  . Pleural effusion   . TIA (transient ischemic attack) 1980s   "I was exercising when I had it"    Patient Active Problem List   Diagnosis Date Noted  . Syncope   . Prediabetes 08/28/2016  . CAD (coronary artery disease) 12/13/2010  . Hyperlipidemia 11/12/2010  . Allergic rhinitis 11/12/2010  . 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 Hematuria 06/22/2009  . Essential hypertension 11/26/2008  . NEPHROLITHIASIS, HX OF 06/26/2007  . History of follicular lymphoma [id number]    Past Surgical History:  Procedure Laterality Date  . CARDIAC CATHETERIZATION N/A 12/22/2015   Procedure: Left Heart Cath and Coronary Angiography;  Surgeon: Sherren Mocha, MD;  Location: [facility];  Service: Cardiovascular;  Laterality: N/A;  . CARDIAC CATHETERIZATION N/A 12/22/2015   Procedure: Coronary Stent Intervention;  Surgeon: Sherren Mocha, MD;  Location: [facility];  Service: Cardiovascular;  Laterality: N/A;  . CARDIAC CATHETERIZATION N/A 12/22/2015   Procedure: Intravascular Pressure Wire/FFR Study;  Surgeon: Sherren Mocha, MD;  Location: [facility];  Service: Cardiovascular;  Laterality: N/A;  . CARDIAC CATHETERIZATION N/A 03/13/2016   Procedure: Left Heart Cath and Coronary Angiography;  Surgeon: Sherren Mocha, MD;  Location: [facility];  Service: Cardiovascular;  Laterality: N/A;  . COLONOSCOPY  2002  . CORONARY ANGIOPLASTY WITH STENT PLACEMENT  ~  2013   "1 stent"  . ESOPHAGOGASTRODUODENOSCOPY (EGD) WITH ESOPHAGEAL DILATION  2002  . KNEE ARTHROSCOPY Left 1990s        Home Medications    Prior to Admission medications   Medication Sig Start Date End Date Taking? Authorizing Provider  amLODipine (NORVASC) 10 MG tablet Take 10 mg by mouth daily.   Yes [provider]  aspirin 81 MG tablet Take 1 tablet (81 mg total) by mouth daily. 11/30/16  Yes Bhagat, Bhavinkumar, PA  clopidogrel (PLAVIX) 75 MG tablet TAKE 1 TABLET EVERY DAY 10/24/18  Yes Sherren Mocha, MD  dorzolamide-timolol (COSOPT) 22.3-6.8 MG/ML ophthalmic solution Place 1  drop into both eyes 2 (two) times daily.  11/05/18  Yes [provider]  finasteride (PROSCAR) 5 MG tablet TAKE 1 TABLET EVERY DAY 09/26/18  Yes Sherren Mocha, MD  nitroGLYCERIN (NITROSTAT) 0.4 MG SL tablet PLACE AND DISSOLVE 1 TABLET UNDER THE TONGUE EVERY 5 MINUTES AS NEEDED FOR CHEST PAIN. Please keep upcoming appt. Thanks 07/02/18   Leanor Kail, PA    Family History Family History  Problem Relation Age of Onset  . Cancer Mother        lung cancer  . Diabetes Mother   . Hyperlipidemia Mother   . Stroke Father        age 13  . Prostate cancer Father   . Cancer Sister        ?lymphoma    Social History Social History   Tobacco Use  . Smoking status: Never Smoker  . Smokeless tobacco: Never Used  Substance Use Topics  . Alcohol use: No    Comment: drank some in my 20's"  . Drug use: No     Allergies   Zetia [ezetimibe], Crestor [rosuvastatin], Isosorbide mononitrate [isosorbide nitrate], Pravachol [pravastatin sodium], and Silver   Review of Systems Review of Systems  Constitutional: Negative for chills and fever.  HENT: Negative for congestion, rhinorrhea and sore throat.   Eyes: Negative for visual disturbance.  Respiratory: Negative for cough and shortness of breath.   Cardiovascular: Positive for chest pain. Negative for leg swelling.  Gastrointestinal: Negative for abdominal pain, diarrhea, nausea and vomiting.  Genitourinary: Negative for dysuria.  Musculoskeletal: Negative for back pain and neck pain.  Skin: Negative for rash.  Neurological: Negative for dizziness, light-headedness and headaches.  Hematological: Does not bruise/bleed easily.  Psychiatric/Behavioral: Negative for confusion.     Physical Exam Updated Vital Signs BP (!) 162/82   Pulse (!) 56   Temp 97.7 F (36.5 C) (Oral)   Resp 16   Ht 1.854 m (6\' 1" )   Wt 93 kg   SpO2 100%   BMI 27.05 kg/m   Physical Exam Vitals signs and nursing note reviewed.  Constitutional:       Appearance: Normal appearance. He is well-developed.  HENT:     Head: Normocephalic and atraumatic.  Eyes:     Conjunctiva/sclera: Conjunctivae normal.     Pupils: Pupils are equal, round, and reactive to light.  Neck:     Musculoskeletal: Normal range of motion and neck supple.  Cardiovascular:     Rate and Rhythm: Normal rate and regular rhythm.     Heart sounds: No murmur.  Pulmonary:     Effort: Pulmonary effort is normal. No respiratory distress.     Breath sounds: Normal breath sounds.  Chest:     Chest wall: No tenderness.  Abdominal:     Palpations: Abdomen is soft.     Tenderness:  There is no abdominal tenderness.  Musculoskeletal:        General: No swelling.     Right lower leg: No edema.     Left lower leg: No edema.  Skin:    General: Skin is warm and dry.     Capillary Refill: Capillary refill takes less than 2 seconds.  Neurological:     General: No focal deficit present.     Mental Status: He is alert and oriented to person, place, and time. Mental status is at baseline.      ED Treatments / Results  Labs (all labs ordered are listed, but only abnormal results are displayed) Labs Reviewed  BASIC METABOLIC PANEL  CBC  TROPONIN I (HIGH SENSITIVITY)  TROPONIN I (HIGH SENSITIVITY)    EKG EKG Interpretation  Date/Time:  Thursday April 17 2019 15:54:45 EDT Ventricular Rate:  59 PR Interval:  180 QRS Duration: 82 QT Interval:  382 QTC Calculation: 378 R Axis:   39 Text Interpretation:  Sinus bradycardia with occasional Premature ventricular complexes and Premature atrial complexes Possible Lateral infarct , age undetermined Possible Inferior infarct , age undetermined Abnormal ECG Confirmed by Fredia Sorrow [id number]) on 04/17/2019 9:53:29 PM   Radiology Dg Chest 2 View  Result Date: 04/17/2019 CLINICAL DATA:  Intermittent left-sided chest pain 5 days. EXAM: CHEST - 2 VIEW COMPARISON:  11/11/2016 FINDINGS: Lungs are adequately inflated with  stable elevation of the right hemidiaphragm. No focal lobar consolidation or effusion. Cardiomediastinal silhouette and remainder of the exam is unchanged. IMPRESSION: No acute findings. Electronically Signed   By: Marin Olp M.D.   On: 04/17/2019 16:50    Procedures Procedures (including critical care time)  Medications Ordered in ED Medications  sodium chloride flush (NS) 0.9 % injection 3 mL (has no administration in time range)     Initial Impression / Assessment and Plan / ED Course  I have reviewed the triage vital signs and the nursing notes.  Pertinent labs & imaging results that were available during my care of the patient were reviewed by me and considered in my medical decision making (see chart for details).        Patient's serial troponins without any significant delta.  Patient currently without any chest pain.  The patient is able to workout on his treadmill 3 times a week without any chest pain.  One component of the chest pain certainly seems musculoskeletal.  That is with movement of the left arm.  But has had a component of other pain but does not occur while on the treadmill.  Feel patient is safe for discharge home and close follow-up by cardiology.  Patient will give Dr. Antionette Char office a call for follow-up.  Chest x-ray negative EKG without any acute changes that seem to be new.  Labs without any significant abnormalities.  Final Clinical Impressions(s) / ED Diagnoses   Final diagnoses:  Precordial pain    ED Discharge Orders    None       Fredia Sorrow, MD 04/17/19 2229

## 2019-04-29 NOTE — Telephone Encounter (Signed)
Please arrange in person visit with Dr. Burt Knack, me or any APP on Dr. Antionette Char team (whomever has availability) to evaluate. Richardson Dopp, PA-C    04/29/2019 10:10 AM

## 2019-05-01 NOTE — Progress Notes (Deleted)
Cardiology Office Note:    Date:  05/01/2019   ID:  Austin [name], DOB 02-02-1946, MRN [id number]  PCP:  Binnie Rail, MD  Cardiologist:  Sherren Mocha, MD *** Electrophysiologist:  None   Referring MD: Binnie Rail, MD   No chief complaint on file. ***  History of Present Illness:    Austin [name] is a 73 y.o. male with:  Coronary artery disease   S/p Lat MI in 2012 tx with BMS to LCx   PCI 4/17: DES to prox LAD  PCI 6/17: DES to OM1  Residual dz: severe small vessel dzin dist PDA, Dx branches of LAD, apical LAD >> med Rx  Myoview 12/19: ant-lat infarct, no ischemia  Follicular lymphoma   Hypertension  Hyperlipidemia   [place] 4 Trial participant   Hx of TIA  Mr. Austin [name] was last seen via Telemedicine in 11/2018.  He recently went to the ED on 04/17/2019 for chest pain. His Troponin levels were normal.  CXR was unremarkable.  ECG demonstrated normal sinus rhythm, TW inversions in 1, aVL and V4-6.  The TWI in V4-6 appear new compared to the prior tracing.    ***  Prior CV studies:   The following studies were reviewed today:  *** Myoview 08/12/18 1. EF 55%, normal wall motion.  2. Primarily fixed medium-sized, moderate intensity basal to mid anterolateral perfusion defect with some reversibility at the base. This appears to be primarily anterolateral infarction though there surprisingly does not appear to be a significant wall motion abnormality.  3. Borderline significant near 1 mm ST depression V4 and V5 at peak exercise, resolving quickly in recovery.  Overall, I think this is a low risk study. Most consistent with anterolateral infarction.   Carotid US 11/12/16 Summary: Findings suggest 1-39% internal carotid artery stenosis bilaterally. Vertebral arteries are patent with antegrade flow.  Echo 1/18 EF 123456, grade 1 diastolic dysfunction, trivial AI, dilated aortic root (42), normal RVSF, trivial pericardial effusion  LHC7/17/17 LAD  proximal stent patent, distal 90 (unchanged from previous study), lateral D1 90-unchanged from previous study; D2 90-unchanged in previous study LCx mid 100-CTO; OM1 95 (denovo),stent patent [id number] RPDA ostial 50, 80 Normal EF55 PCI: 2.75 x 18 mm Xience Alpine DES to OM1  [facility] 12/22/15 LAD prox 90%, dist 90%; D1 95% LCx - OM1 60% (The resting FFR is 0.98 and the FFR at peak hyperemia is 0.87), stent patent RCA prox 25%, dist 60%, RPDA 95% PCI 4 x 15 Resolute DES to pLAD 1. Severe proximal LAD stenosis, treated successfully with PCI using a drug-eluting stent 2. Continued patency of the stented segment in the obtuse marginal branch with moderate stenosis of that branch before the stent, negative pressure wire analysis 3. Patent RCA with mild diffuse disease 4. Significant small vessel coronary artery disease with severe stenoses of the PDA branch, diagonal subbranch, and apical LAD, all appropriate for medical therapy 5. Normal LV function by echo Recommend: Aggressive medical therapy for her residual CAD, continue dual antiplatelet therapy with aspirin and Plavix.  ETT-Echo 12/08/15 Impressions: Abnormal study after maximal exercise with reproduction of symptoms. Study suggests myocardial ischemia in the inferolateral and anteroseptal walls.  Myoview 10/15 Overall Impression:The study is abnormal. This is a low risk scan. There is mild reduction in the ejection fraction. There is a medium-sized scar of moderate severity affecting the anterolateral wall and the mid-inferolateral wall. There is no definite ischemia.LV Ejection Fraction: 48%.LV Wall Motion:Mild hypokinesis of the lateral wall.  Echo 5/08 EF 55-65%, normal wall motion, mild aortic root dilatation, mild LAE   Past Medical History:  Diagnosis Date  . Allergy   . Arthritis   . CAP (community acquired pneumonia)   . Cataract    bilateral  . Coronary artery disease    03/13/2016 DES to first OM, EF 55% //  Myoview 12/19: EF 55, ant-lat infarction, no significant ischemia, Low Risk  . Heart murmur    "outgrew it" (03/13/2016)  . Hematuria   . HTN (hypertension)   . Hx of cardiovascular stress test    ETT-Myoview (10/15): Nondiagnostic EKG changes, anterolateral and inferolateral scar, no ischemia, EF 48%, low risk  . Hyperlipemia   . Hyperlipidemia    NMR Lipoprofile 2008: LDL 168 ( 2410/ 1826), HDL 41, TG 71. LDL goal = <100, ideally <  70. Framingham Study LDL goal = < 130.  [place] Kitchen Kidney stones    "passed them"; Dr. Terance Hart (03/13/2016)  . Myocardial infarction ([place]) 2001  . Non Hodgkin's lymphoma [facility])    [facility]  . Pleural effusion   . TIA (transient ischemic attack) 1980s   "I was exercising when I had it"   Surgical Hx: The patient  has a past surgical history that includes Esophagogastroduodenoscopy (egd) with esophageal dilation (2002); Knee arthroscopy (Left, 1990s); Coronary angioplasty with stent (~ 2013); Cardiac catheterization (N/A, 12/22/2015); Cardiac catheterization (N/A, 12/22/2015); Cardiac catheterization (N/A, 12/22/2015); Cardiac catheterization (N/A, 03/13/2016); and Colonoscopy (2002).   Current Medications: No outpatient medications have been marked as taking for the 05/02/19 encounter (Appointment) with Richardson Dopp T, PA-C.     Allergies:   Zetia [ezetimibe], Crestor [rosuvastatin], Isosorbide mononitrate [isosorbide nitrate], Pravachol [pravastatin sodium], and Silver   Social History   Tobacco Use  . Smoking status: Never Smoker  . Smokeless tobacco: Never Used  Substance Use Topics  . Alcohol use: No    Comment: drank some in my 20's"  . Drug use: No     Family Hx: The patient's family history includes Cancer in his mother and sister; Diabetes in his mother; Hyperlipidemia in his mother; Prostate cancer in his father; Stroke in his father.  ROS:   Please see the history of present illness.    ROS All other systems reviewed and are negative.    EKGs/Labs/Other Test Reviewed:    EKG:  EKG is *** ordered today.  The ekg ordered today demonstrates ***  Recent Labs: 04/17/2019: BUN 15; Creatinine, Ser 1.12; Hemoglobin 13.3; Platelets 255; Potassium 4.4; Sodium 141   Recent Lipid Panel Lab Results  Component Value Date/Time   CHOL 255 (H) 02/20/2018 11:57 AM   TRIG 92.0 02/20/2018 11:57 AM   HDL 45.80 02/20/2018 11:57 AM   CHOLHDL 6 02/20/2018 11:57 AM   LDLCALC 191 (H) 02/20/2018 11:57 AM   LDLDIRECT 173.2 07/21/2013 08:09 AM    Physical Exam:    VS:  There were no vitals taken for this visit.    Wt Readings from Last 3 Encounters:  04/17/19 205 lb (93 kg)  03/10/19 199 lb (90.3 kg)  12/23/18 205 lb (93 kg)     ***Physical Exam  ASSESSMENT & PLAN:    *** Chest pain, unspecified type His symptoms of chest discomfort sound consistent with chest wall pain.  They seem to be brought on with certain positions.  He has not had symptoms reminiscent of his previous angina.  He does not describe exertional symptoms.  Therefore, I do not think an anti-anginal is warranted  at this time.  I have asked him to continue to monitor his symptoms and to contact us if they are not improving or if they are worsening.  Coronary artery disease involving native coronary artery of native heart without angina pectoris History of prior myocardial infarction and multiple PCI procedures. Last PCI in July 2017.  Myoview 12/19 low risk.  As noted, his current symptoms do not sound consistent with angina.  Continue amlodipine, aspirin, clopidogrel.  He is intolerant of statins.  Essential hypertension Blood pressure somewhat elevated today.  It is usually much better than this.  Continue to monitor.  Hyperlipidemia, unspecified hyperlipidemia type He is currently enrolled in the [place] study.  COVID-19 Education: The signs and symptoms of COVID-19 were discussed with the patient and how to seek care for testing (follow up with PCP or  arrange E-visit).  The importance of social distancing was discussed today.   Dispo:  No follow-ups on file.   Medication Adjustments/Labs and Tests Ordered: Current medicines are reviewed at length with the patient today.  Concerns regarding medicines are outlined above.  Tests Ordered: No orders of the defined types were placed in this encounter.  Medication Changes: No orders of the defined types were placed in this encounter.   Signed, Richardson Dopp, PA-C  05/01/2019 8:38 PM    [facility] [place], [place], [place]  [address] Phone: [phone]; Fax: [phone]

## 2019-05-02 ENCOUNTER — Ambulatory Visit (INDEPENDENT_AMBULATORY_CARE_PROVIDER_SITE_OTHER): Payer: Medicare HMO | Admitting: Physician Assistant

## 2019-05-02 ENCOUNTER — Encounter: Payer: Self-pay | Admitting: Physician Assistant

## 2019-05-02 ENCOUNTER — Other Ambulatory Visit: Payer: Self-pay

## 2019-05-02 ENCOUNTER — Ambulatory Visit: Payer: Medicare HMO | Admitting: Physician Assistant

## 2019-05-02 VITALS — BP 134/80 | HR 63 | Ht 73.0 in | Wt 199.8 lb

## 2019-05-02 DIAGNOSIS — I2510 Atherosclerotic heart disease of native coronary artery without angina pectoris: Secondary | ICD-10-CM | POA: Diagnosis not present

## 2019-05-02 DIAGNOSIS — R079 Chest pain, unspecified: Secondary | ICD-10-CM

## 2019-05-02 DIAGNOSIS — E785 Hyperlipidemia, unspecified: Secondary | ICD-10-CM | POA: Diagnosis not present

## 2019-05-02 DIAGNOSIS — I10 Essential (primary) hypertension: Secondary | ICD-10-CM | POA: Diagnosis not present

## 2019-05-02 MED ORDER — NITROGLYCERIN 0.4 MG SL SUBL: 0.4000 mg | SUBLINGUAL_TABLET | SUBLINGUAL | 11 refills | Status: DC | PRN

## 2019-05-02 NOTE — Patient Instructions (Signed)
Medication Instructions:  Your physician recommends that you continue on your current medications as directed. Please refer to the Current Medication list given to you today.  Labwork: None  Testing/Procedures: None  Follow-Up: [place], Dr. Antionette Char nurse will be calling you with appointment time and date for December.  Patient likes am and tues or wed.   Any Other Special Instructions Will Be Listed Below (If Applicable).     If you need a refill on your cardiac medications before your next appointment, please call your pharmacy.

## 2019-05-02 NOTE — Progress Notes (Signed)
Cardiology Office Note:    Date:  05/02/2019   ID:  Austin [name], DOB 1946-07-30, MRN [id number]  PCP:  Binnie Rail, MD  Cardiologist:  Sherren Mocha, MD  Electrophysiologist:  None   Referring MD: Binnie Rail, MD   Chief Complaint  Patient presents with   Chest Pain    History of Present Illness:    Austin [name] is a 72 y.o. male with:  Coronary artery disease   S/p Lat MI in 2012 tx with BMS to LCx   PCI 4/17: DES to prox LAD  PCI 7/17: DES to OM1  Residual dz: severe small vessel dz in dist PDA, Dx branches of LAD, apical LAD >> med Rx  Myoview 12/19: ant-lat infarct, no ischemia  Follicular lymphoma   Hypertension  Hyperlipidemia   Austin [name]   Hx of TIA  Austin [name] was last seen via Telemedicine in 11/2018.  Austin [name] recently went to the ED on 04/17/2019 for chest pain. His Troponin levels were normal.  CXR was unremarkable.  ECG demonstrated normal sinus rhythm, TW inversions in 1, aVL and V4-6.  The TWI in V4-6 appear new compared to the prior tracing.    Austin [name] returns for evaluation of chest pain.  Austin [name] has had chest soreness off and on over the years.  Of note, Austin [name] did have radiation for treatment of his lymphoma.  Austin [name] exercises on a regular basis.  Austin [name] walks on the treadmill twice a week at a 6% grade up to 2.5 miles an hour.  Austin [name] does not have chest discomfort with this.  Austin [name] does not have shortness of breath.  His soreness seems to come on with positional changes.  Austin [name] has not had any symptoms reminiscent of his previous angina.  Austin [name] has not had syncope, orthopnea, leg swelling.  Prior CV studies:   The following studies were reviewed today:  Myoview 08/12/18 1. EF 55%, normal wall motion.  2. Primarily fixed medium-sized, moderate intensity basal to mid anterolateral perfusion defect with some reversibility at the base. This appears to be primarily anterolateral infarction though there surprisingly does not appear to be a significant  wall motion abnormality.  3. Borderline significant near 1 mm ST depression V4 and V5 at peak exercise, resolving quickly in recovery.  Overall, I think this is a low risk study. Most consistent with anterolateral infarction.   Carotid US 11/12/16 Summary: Findings suggest 1-39% internal carotid artery stenosis bilaterally. Vertebral arteries are patent with antegrade flow.  Echo 1/18 EF 123456, grade 1 diastolic dysfunction, trivial AI, dilated aortic root (42), normal RVSF, trivial pericardial effusion  CARDIAC CATHETERIZATION 03/13/16 LAD proximal stent patent, distal 90 (unchanged from previous study), lateral D1 90-unchanged from previous study; D2 90-unchanged in previous study LCx mid 100-CTO; OM1 95 (denovo),stent patent with25ISR RPDA ostial 50, 80 Normal EF55 PCI: 2.75 x 18 mm Xience Alpine DES to OM1    [facility] 12/22/15 LAD prox 90%, dist 90%; D1 95% LCx - OM1 60% (The resting FFR is 0.98 and the FFR at peak hyperemia is 0.87), stent patent RCA prox 25%, dist 60%, RPDA 95% PCI 4 x 15 Resolute DES to pLAD 1. Severe proximal LAD stenosis, treated successfully with PCI using a drug-eluting stent 2. Continued patency of the stented segment in the obtuse marginal branch with moderate stenosis of that branch before the stent, negative pressure wire analysis 3. Patent RCA with mild diffuse disease 4. Significant small vessel coronary artery disease with severe  stenoses of the PDA branch, diagonal subbranch, and apical LAD, all appropriate for medical therapy 5. Normal LV function by echo Recommend: Aggressive medical therapy for her residual CAD, continue dual antiplatelet therapy with aspirin and Plavix.  ETT-Echo 12/08/15 Impressions: Abnormal study after maximal exercise with reproduction of symptoms. Study suggests myocardial ischemia in the inferolateral and anteroseptal walls.  Myoview 10/15 Overall Impression:The study is abnormal. This is a low risk scan. There  is mild reduction in the ejection fraction. There is a medium-sized scar of moderate severity affecting the anterolateral wall and the mid-inferolateral wall. There is no definite ischemia.LV Ejection Fraction: 48%.LV Wall Motion:Mild hypokinesis of the lateral wall.  Echo 5/08 EF 55-65%, normal wall motion, mild aortic root dilatation, mild LAE   Past Medical History:  Diagnosis Date   Allergy    Arthritis    CAP (community acquired pneumonia)    Cataract    bilateral   Coronary artery disease    03/13/2016 DES to first OM, EF 55% // Myoview 12/19: EF 55, ant-lat infarction, no significant ischemia, Low Risk   Heart murmur    "outgrew it" (03/13/2016)   Hematuria    HTN (hypertension)    Hx of cardiovascular stress test    ETT-Myoview (10/15): Nondiagnostic EKG changes, anterolateral and inferolateral scar, no ischemia, EF 48%, low risk   Hyperlipemia    Hyperlipidemia    NMR Lipoprofile 2008: LDL 168 ( 2410/ 1826), HDL 41, TG 71. LDL goal = <100, ideally <  70. Framingham Study LDL goal = < 130.   Kidney stones    "passed them"; Dr. Terance Hart (03/13/2016)   Myocardial infarction [facility]) 2001   [facility]   Pleural effusion    TIA (transient ischemic attack) 1980s   "I was exercising when I had it"   Surgical Hx: The patient  has a past surgical history that includes Esophagogastroduodenoscopy (egd) with esophageal dilation (2002); Knee arthroscopy (Left, 1990s); Coronary angioplasty with stent (~ 2013); Cardiac catheterization (N/A, 12/22/2015); Cardiac catheterization (N/A, 12/22/2015); Cardiac catheterization (N/A, 12/22/2015); Cardiac catheterization (N/A, 03/13/2016); and Colonoscopy (2002).   Current Medications: Current Meds  Medication Sig   amLODipine (NORVASC) 10 MG tablet Take 10 mg by mouth daily.   aspirin 81 MG tablet Take 1 tablet (81 mg total) by mouth daily.   clopidogrel (PLAVIX) 75 MG tablet TAKE 1  TABLET EVERY DAY   dorzolamide-timolol (COSOPT) 22.3-6.8 MG/ML ophthalmic solution Place 1 drop into both eyes 2 (two) times daily.    finasteride (PROSCAR) 5 MG tablet TAKE 1 TABLET EVERY DAY   nitroGLYCERIN (NITROSTAT) 0.4 MG SL tablet Place 1 tablet (0.4 mg total) under the tongue every 5 (five) minutes as needed for chest pain.     Allergies:   Zetia [ezetimibe], Crestor [rosuvastatin], Isosorbide mononitrate [isosorbide nitrate], Pravachol [pravastatin sodium], and Silver   Social History   Tobacco Use   Smoking status: Never Smoker   Smokeless tobacco: Never Used  Substance Use Topics   Alcohol use: No    Comment: drank some in my 20's"   Drug use: No     Family Hx: The patient's family history includes Cancer in his mother and sister; Diabetes in his mother; Hyperlipidemia in his mother; Prostate cancer in his father; Stroke in his father.  ROS:   Please see the history of present illness.    Review of Systems  Gastrointestinal: Negative for hematochezia.  Genitourinary: Negative for hematuria.   All  other systems reviewed and are negative.   EKGs/Labs/Other Test Reviewed:    EKG:  EKG is  ordered today.  The ekg ordered today demonstrates normal sinus rhythm, heart rate 63, normal axis, T wave inversions 1, aVL, Q waves 3, aVF, QTC 395, similar to prior tracings  Recent Labs: 04/17/2019: BUN 15; Creatinine, Ser 1.12; Hemoglobin 13.3; Platelets 255; Potassium 4.4; Sodium 141   Recent Lipid Panel Lab Results  Component Value Date/Time   CHOL 255 (H) 02/20/2018 11:57 AM   TRIG 92.0 02/20/2018 11:57 AM   HDL 45.80 02/20/2018 11:57 AM   CHOLHDL 6 02/20/2018 11:57 AM   LDLCALC 191 (H) 02/20/2018 11:57 AM   LDLDIRECT 173.2 07/21/2013 08:09 AM    Physical Exam:    VS:  BP 134/80    Pulse 63    Ht 6\' 1"  (1.854 m)    Wt 199 lb 12.8 oz (90.6 kg)    SpO2 98%    BMI 26.36 kg/m     Wt Readings from Last 3 Encounters:  05/02/19 199 lb 12.8 oz (90.6 kg)  04/17/19  205 lb (93 kg)  03/10/19 199 lb (90.3 kg)     Physical Exam  Constitutional: Austin [name] is oriented to person, place, and time. Austin [name] appears well-developed and well-nourished. No distress.  HENT:  Head: Normocephalic and atraumatic.  Eyes: No scleral icterus.  Neck: No JVD present. No thyromegaly present.  Cardiovascular: Normal rate, regular rhythm and normal heart sounds.  No murmur heard. Pulmonary/Chest: Effort normal and breath sounds normal. Austin [name] has no rales.  Abdominal: Soft. There is no hepatomegaly.  Musculoskeletal:        General: No edema.  Lymphadenopathy:    Austin [name] has no cervical adenopathy.  Neurological: Austin [name] is alert and oriented to person, place, and time.  Skin: Skin is warm and dry.  Psychiatric: Austin [name] has a normal mood and affect.    ASSESSMENT & PLAN:    1. Chest pain, unspecified type Austin [name] continues to have episodes of musculoskeletal type chest pain.  His symptoms are not like his previous anginal symptoms and Austin [name] is able to exercise at a good workload without chest symptoms.  Austin [name] just had a stress test in December that demonstrated no significant ischemia.  I have asked him to continue to monitor his symptoms and notify us if there is any change.  I have encouraged him to try to use nitroglycerin if his symptoms are worse.  If Austin [name] has improvement in his symptoms with nitroglycerin, we could consider long-acting nitrates.  2. Coronary artery disease involving native coronary artery of native heart without angina pectoris History of prior myocardial infarction and multiple PCI procedures.  Last PCI in July 2017 was with a DES to the OM1.  Austin [name] has residual small vessel disease and distal vessel disease which is treated medically.  At this point, Austin [name] does not seem to have exertional angina.  Therefore, continue current medical therapy which includes amlodipine, aspirin, Plavix.  Austin [name] is intolerant of statins.  3. Essential hypertension The patient's blood pressure is controlled on his  current regimen.  Continue current therapy.   4. Hyperlipidemia, unspecified hyperlipidemia type Austin [name] continues to participate in the [place] 4 trial.   Dispo:  Return in about 3 months (around 08/01/2019) for Routine Follow Up, w/ Dr. Burt Knack, (virtual or in-person).   Medication Adjustments/Labs and Tests Ordered: Current medicines are reviewed at length with the patient today.  Concerns regarding medicines are outlined above.  Tests Ordered: Orders Placed  This Encounter  Procedures   EKG 12-Lead   Medication Changes: Meds ordered this encounter  Medications   nitroGLYCERIN (NITROSTAT) 0.4 MG SL tablet    Sig: Place 1 tablet (0.4 mg total) under the tongue every 5 (five) minutes as needed for chest pain.    Dispense:  25 tablet    Refill:  11    Order Specific Question:   Supervising Provider    Answer:   Lelon Perla [[id number]]    Signed, Richardson Dopp, PA-C  05/02/2019 11:58 AM    [facility] [place], [place], [place]  [address] Phone: [phone]; Fax: [phone]

## 2019-05-28 ENCOUNTER — Encounter: Payer: Medicare HMO | Admitting: *Deleted

## 2019-05-28 ENCOUNTER — Other Ambulatory Visit: Payer: Self-pay

## 2019-05-28 DIAGNOSIS — Z006 Encounter for examination for normal comparison and control in clinical research program: Secondary | ICD-10-CM

## 2019-05-28 NOTE — Research (Signed)
Subject to research clinic for visit 3 in the [place] research study.  No cos, aes or saes to report.  Injection given, subject tolerated well.  Next clinic visit scheduled.

## 2019-06-02 ENCOUNTER — Ambulatory Visit: Payer: Medicare HMO

## 2019-06-07 ENCOUNTER — Other Ambulatory Visit: Payer: Self-pay

## 2019-06-07 ENCOUNTER — Ambulatory Visit (INDEPENDENT_AMBULATORY_CARE_PROVIDER_SITE_OTHER): Payer: Medicare HMO

## 2019-06-07 DIAGNOSIS — Z23 Encounter for immunization: Secondary | ICD-10-CM

## 2019-06-20 DIAGNOSIS — H401122 Primary open-angle glaucoma, left eye, moderate stage: Secondary | ICD-10-CM | POA: Diagnosis not present

## 2019-06-20 DIAGNOSIS — H2513 Age-related nuclear cataract, bilateral: Secondary | ICD-10-CM | POA: Diagnosis not present

## 2019-06-20 DIAGNOSIS — H401111 Primary open-angle glaucoma, right eye, mild stage: Secondary | ICD-10-CM | POA: Diagnosis not present

## 2019-06-20 DIAGNOSIS — H25013 Cortical age-related cataract, bilateral: Secondary | ICD-10-CM | POA: Diagnosis not present

## 2019-07-02 ENCOUNTER — Ambulatory Visit: Payer: Medicare HMO

## 2019-07-14 ENCOUNTER — Other Ambulatory Visit: Payer: Self-pay | Admitting: Physician Assistant

## 2019-08-04 ENCOUNTER — Other Ambulatory Visit: Payer: Self-pay

## 2019-08-04 ENCOUNTER — Ambulatory Visit (INDEPENDENT_AMBULATORY_CARE_PROVIDER_SITE_OTHER): Payer: Medicare HMO | Admitting: Cardiovascular Disease

## 2019-08-04 ENCOUNTER — Encounter: Payer: Self-pay | Admitting: Cardiovascular Disease

## 2019-08-04 VITALS — BP 164/82 | HR 64 | Ht 73.0 in | Wt 205.8 lb

## 2019-08-04 DIAGNOSIS — I10 Essential (primary) hypertension: Secondary | ICD-10-CM | POA: Diagnosis not present

## 2019-08-04 DIAGNOSIS — E782 Mixed hyperlipidemia: Secondary | ICD-10-CM

## 2019-08-04 DIAGNOSIS — I2510 Atherosclerotic heart disease of native coronary artery without angina pectoris: Secondary | ICD-10-CM | POA: Diagnosis not present

## 2019-08-04 NOTE — Progress Notes (Signed)
Cardiology Office Note:    Date:  08/04/2019   ID:  DREZDEN SUFFRIDGE, DOB 10/07/1945, MRN [id number]  PCP:  Binnie Rail, MD  Cardiologist:  Sherren Mocha, MD  Electrophysiologist:  None   Referring MD: Binnie Rail, MD   Chief Complaint  Patient presents with  . Coronary Artery Disease   History of Present Illness:    Austin [name] is a 72 y.o. male with a hx of coronary artery disease, hypertension, and mixed hyperlipidemia, presenting for follow-up evaluation.  The patient has been doing well from a symptomatic perspective.  He is physically active with regular exercise and he denies any exertional symptoms.  He specifically denies chest pain, chest pressure, or shortness of breath.  He is compliant with his medications.  We reviewed his old cath films together today.  He last underwent PCI in 2017 when he was treated with stenting of the LAD in April and later in the year underwent stenting of critical stenosis in the OM branch of the circumflex.  He has remained on dual antiplatelet therapy with aspirin and clopidogrel with history of recurrent ACS presentations.  He denies any bleeding or bruising problems.  Past Medical History:  Diagnosis Date  . Allergy   . Arthritis   . CAP (community acquired pneumonia)   . Cataract    bilateral  . Coronary artery disease    03/13/2016 DES to first OM, EF 55% // Myoview 12/19: EF 55, ant-lat infarction, no significant ischemia, Low Risk  . Heart murmur    "outgrew it" (03/13/2016)  . Hematuria   . HTN (hypertension)   . Hx of cardiovascular stress test    ETT-Myoview (10/15): Nondiagnostic EKG changes, anterolateral and inferolateral scar, no ischemia, EF 48%, low risk  . Hyperlipemia   . Hyperlipidemia    NMR Lipoprofile 2008: LDL 168 ( 2410/ 1826), HDL 41, TG 71. LDL goal = <100, ideally <  70. Framingham Study LDL goal = < 130.  Marland Kitchen Kidney stones    "passed them"; Dr. Terance Hart (03/13/2016)  . Myocardial infarction ([place])  2001  . [facility] Hod[facility]  . Pleural effusion   . TIA (transient ischemic attack) 1980s   "I was exercising when I had it"    Past Surgical History:  Procedure Laterality Date  . CARDIAC CATHETERIZATION N/A 12/22/2015   Procedure: Left Heart Cath and Coronary Angiography;  Surgeon: Sherren Mocha, MD;  Location: [place] [facility];  Service: Cardiovascular;  Laterality: N/A;  . CARDIAC CATHETERIZATION N/A 12/22/2015   Procedure: Coronary Stent Intervention;  Surgeon: Sherren Mocha, MD;  Location: [place] [facility];  Service: Cardiovascular;  Laterality: N/A;  . CARDIAC CATHETERIZATION N/A 12/22/2015   Procedure: Intravascular Pressure Wire/FFR Study;  Surgeon: Sherren Mocha, MD;  Location: [place] [facility];  Service: Cardiovascular;  Laterality: N/A;  . CARDIAC CATHETERIZATION N/A 03/13/2016   Procedure: Left Heart Cath and Coronary Angiography;  Surgeon: Sherren Mocha, MD;  Location: [place] [facility];  Service: Cardiovascular;  Laterality: N/A;  . COLONOSCOPY  2002  . CORONARY ANGIOPLASTY WITH STENT PLACEMENT  ~ 2013   "1 stent"  . ESOPHAGOGASTRODUODENOSCOPY (EGD) WITH ESOPHAGEAL DILATION  2002  . KNEE ARTHROSCOPY Left 1990s    Current Medications: Current Meds  Medication Sig  . amLODipine (NORVASC) 10 MG tablet Take 1 tablet (10 mg total) by mouth daily.  Marland Kitchen aspirin 81 MG tablet Take 1 tablet (81 mg total) by mouth  daily.  . clopidogrel (PLAVIX) 75 MG tablet TAKE 1 TABLET EVERY DAY  . dorzolamide-timolol (COSOPT) 22.3-6.8 MG/ML ophthalmic solution Place 1 drop into both eyes 2 (two) times daily.   . finasteride (PROSCAR) 5 MG tablet TAKE 1 TABLET EVERY DAY  . latanoprost (XALATAN) 0.005 % ophthalmic solution Place 1 drop into both eyes at bedtime.  . nitroGLYCERIN (NITROSTAT) 0.4 MG SL tablet Place 1 tablet (0.4 mg total) under the tongue every 5 (five) minutes as needed for chest pain.     Allergies:   Zetia [ezetimibe], Crestor  [rosuvastatin], Isosorbide mononitrate [isosorbide nitrate], Pravachol [pravastatin sodium], and Silver   Social History   Socioeconomic History  . Marital status: Married    Spouse name: Not on file  . Number of children: 3  . Years of education: Not on file  . Highest education level: Not on file  Occupational History  . Occupation: retired Medical sales representative  . Financial resource strain: Not hard at all  . Food insecurity    Worry: Never true    Inability: Never true  . Transportation needs    Medical: No    Non-medical: No  Tobacco Use  . Smoking status: Never Smoker  . Smokeless tobacco: Never Used  Substance and Sexual Activity  . Alcohol use: No    Comment: drank some in my 20's"  . Drug use: No  . Sexual activity: Yes  Lifestyle  . Physical activity    Days per week: 5 days    Minutes per session: 60 min  . Stress: Not at all  Relationships  . Social connections    Talks on phone: More than three times a week    Gets together: More than three times a week    Attends religious service: More than 4 times per year    Active member of club or organization: Yes    Attends meetings of clubs or organizations: More than 4 times per year    Relationship status: Married  Other Topics Concern  . Not on file  Social History Narrative  . Not on file     Family History: The patient's family history includes Cancer in his mother and sister; Diabetes in his mother; Hyperlipidemia in his mother; Prostate cancer in his father; Stroke in his father.  ROS:   Please see the history of present illness.    All other systems reviewed and are negative.  EKGs/Labs/Other Studies Reviewed:    The following studies were reviewed today: Cardiac catheterization images were reviewed with the patient today  EKG:  EKG is not ordered today.    Recent Labs: 04/17/2019: BUN 15; Creatinine, Ser 1.12; Hemoglobin 13.3; Platelets 255; Potassium 4.4; Sodium 141  Recent Lipid Panel     Component Value Date/Time   CHOL 255 (H) 02/20/2018 1157   TRIG 92.0 02/20/2018 1157   HDL 45.80 02/20/2018 1157   CHOLHDL 6 02/20/2018 1157   VLDL 18.4 02/20/2018 1157   LDLCALC 191 (H) 02/20/2018 1157   LDLDIRECT 173.2 07/21/2013 0809    Physical Exam:    VS:  BP (!) 164/82   Pulse 64   Ht 6\' 1"  (1.854 m)   Wt 205 lb 12.8 oz (93.4 kg)   SpO2 96%   BMI 27.15 kg/m     Wt Readings from Last 3 Encounters:  08/04/19 205 lb 12.8 oz (93.4 kg)  05/02/19 199 lb 12.8 oz (90.6 kg)  04/17/19 205 lb (93 kg)     GEN:  Well nourished, well developed in no acute distress HEENT: Normal NECK: No JVD; No carotid bruits LYMPHATICS: No lymphadenopathy CARDIAC: RRR, no murmurs, rubs, gallops RESPIRATORY:  Clear to auscultation without rales, wheezing or rhonchi  ABDOMEN: Soft, non-tender, non-distended MUSCULOSKELETAL:  No edema; No deformity  SKIN: Warm and dry NEUROLOGIC:  Alert and oriented x 3 PSYCHIATRIC:  Normal affect   ASSESSMENT:    1. Coronary artery disease involving native coronary artery of native heart without angina pectoris   2. Essential hypertension   3. Mixed hyperlipidemia    PLAN:    In order of problems listed above:  1. Stable without angina at present.  Continue current medical regimen.  Have not recommended any changes today. 2. Blood pressure is well controlled on amlodipine.  Reports home blood pressure readings in the 130s over 70s.  Longstanding history of whitecoat hypertension.  Continue amlodipine. 3. Statin intolerant.  Intolerant to Zetia and other lipid-lowering therapies as well.  He is followed through the [facility] trial.   Medication Adjustments/Labs and Tests Ordered: Current medicines are reviewed at length with the patient today.  Concerns regarding medicines are outlined above.  No orders of the defined types were placed in this encounter.  No orders of the defined types were placed in this encounter.   Patient Instructions  Medication  Instructions:  Your provider recommends that you continue on your current medications as directed. Please refer to the Current Medication list given to you today.   *If you need a refill on your cardiac medications before your next appointment, please call your pharmacy*   Follow-Up: At [facility], you and your health needs are our priority.  As part of our continuing mission to provide you with exceptional heart care, we have created designated Provider Care Teams.  These Care Teams include your primary Cardiologist (physician) and Advanced Practice Providers (APPs -  Physician Assistants and Nurse Practitioners) who all work together to provide you with the care you need, when you need it. Your next appointment:   12 month(s) The format for your next appointment:   In Person Provider:   You may see Sherren Mocha, MD or one of the following Advanced Practice Providers on your designated Care Team:    Richardson Dopp, PA-C  Vin Antioch, PA-C  Daune Perch, [place]    Signed, Sherren Mocha, MD  08/04/2019 5:40 PM    [place]

## 2019-08-04 NOTE — Patient Instructions (Signed)

## 2019-08-11 DIAGNOSIS — H401111 Primary open-angle glaucoma, right eye, mild stage: Secondary | ICD-10-CM | POA: Diagnosis not present

## 2019-08-11 DIAGNOSIS — H401122 Primary open-angle glaucoma, left eye, moderate stage: Secondary | ICD-10-CM | POA: Diagnosis not present

## 2019-09-21 NOTE — Progress Notes (Signed)
Subjective:    Patient ID: Austin [name], male    DOB: 05-31-1946, 74 y.o.   MRN: [id number]  HPI The patient is here for an acute visit.   Pain on right side:  It is intermittent for about 1-2 months.  He thinks he pulled a muscle.  He has a big dog and the dog may have pulled him too hard.  He is not sure.  He thinks the pain is getting better - it started at a 10/10 and is now a 6/10.Marland Kitchen He feels it with movement, stretching, twisting.  He denies increased pain with deep breaths.  He denies cough, wheeze, sob.      Hypertension: He is taking his medication daily. He is compliant with a low sodium diet. He has known white coat htn.  He does monitor his blood pressure at home -  134-137/80-85.     Medications and allergies reviewed with patient and updated if appropriate.  Patient Active Problem List   Diagnosis Date Noted  . Syncope   . Prediabetes 08/28/2016  . CAD (coronary artery disease) 12/13/2010  . Hyperlipidemia 11/12/2010  . Allergic rhinitis 11/12/2010  . 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 Hematuria 06/22/2009  . Essential hypertension 11/26/2008  . NEPHROLITHIASIS, HX OF 06/26/2007  . History of follicular lymphoma [id number]    Current Outpatient Medications on File Prior to Visit  Medication Sig Dispense Refill  . amLODipine (NORVASC) 10 MG tablet Take 1 tablet (10 mg total) by mouth daily. 90 tablet 3  . aspirin 81 MG tablet Take 1 tablet (81 mg total) by mouth daily. 30 tablet   . clopidogrel (PLAVIX) 75 MG tablet TAKE 1 TABLET EVERY DAY 90 tablet 2  . dorzolamide-timolol (COSOPT) 22.3-6.8 MG/ML ophthalmic solution Place 1 drop into both eyes 2 (two) times daily.     . finasteride (PROSCAR) 5 MG tablet TAKE 1 TABLET EVERY DAY 90 tablet 3  . latanoprost (XALATAN) 0.005 % ophthalmic solution Place 1 drop into both eyes at bedtime.    . nitroGLYCERIN (NITROSTAT) 0.4 MG SL tablet Place 1 tablet (0.4 mg total) under the tongue every 5  (five) minutes as needed for chest pain. 25 tablet 11   No current facility-administered medications on file prior to visit.    Past Medical History:  Diagnosis Date  . Allergy   . Arthritis   . CAP (community acquired pneumonia)   . Cataract    bilateral  . Coronary artery disease    03/13/2016 DES to first OM, EF 55% // Myoview 12/19: EF 55, ant-lat infarction, no significant ischemia, Low Risk  . Heart murmur    "outgrew it" (03/13/2016)  . Hematuria   . HTN (hypertension)   . Hx of cardiovascular stress test    ETT-Myoview (10/15): Nondiagnostic EKG changes, anterolateral and inferolateral scar, no ischemia, EF 48%, low risk  . Hyperlipemia   . Hyperlipidemia    NMR Lipoprofile 2008: LDL 168 ( 2410/ 1826), HDL 41, TG 71. LDL goal = <100, ideally <  70. Framingham Study LDL goal = < 130.  Marland Kitchen Kidney stones    "passed them"; Dr. Terance Hart (03/13/2016)  . Myocardial infarction ([place]) 2001  . Non Hodgkin's lymphoma ([place])    [facility]  . Pleural effusion   . TIA (transient ischemic attack) 1980s   "I was exercising when I had it"    Past Surgical History:  Procedure Laterality Date  . CARDIAC CATHETERIZATION  N/A 12/22/2015   Procedure: Left Heart Cath and Coronary Angiography;  Surgeon: Sherren Mocha, MD;  Location: [place] [facility];  Service: Cardiovascular;  Laterality: N/A;  . CARDIAC CATHETERIZATION N/A 12/22/2015   Procedure: Coronary Stent Intervention;  Surgeon: Sherren Mocha, MD;  Location: [place] [facility];  Service: Cardiovascular;  Laterality: N/A;  . CARDIAC CATHETERIZATION N/A 12/22/2015   Procedure: Intravascular Pressure Wire/FFR Study;  Surgeon: Sherren Mocha, MD;  Location: [place] [facility];  Service: Cardiovascular;  Laterality: N/A;  . CARDIAC CATHETERIZATION N/A 03/13/2016   Procedure: Left Heart Cath and Coronary Angiography;  Surgeon: Sherren Mocha, MD;  Location: [place] [facility];  Service: Cardiovascular;  Laterality: N/A;  . COLONOSCOPY   2002  . CORONARY ANGIOPLASTY WITH STENT PLACEMENT  ~ 2013   "1 stent"  . ESOPHAGOGASTRODUODENOSCOPY (EGD) WITH ESOPHAGEAL DILATION  2002  . KNEE ARTHROSCOPY Left 1990s    Social History   Socioeconomic History  . Marital status: Married    Spouse name: Not on file  . Number of children: 3  . Years of education: Not on file  . Highest education level: Not on file  Occupational History  . Occupation: retired Higher education careers adviser  Tobacco Use  . Smoking status: Never Smoker  . Smokeless tobacco: Never Used  Substance and Sexual Activity  . Alcohol use: No    Comment: drank some in my 20's"  . Drug use: No  . Sexual activity: Yes  Other Topics Concern  . Not on file  Social History Narrative  . Not on file   Social Determinants of Health   Financial Resource Strain:   . Difficulty of Paying Living Expenses: Not on file  Food Insecurity:   . Worried About Charity fundraiser in the Last Year: Not on file  . Ran Out of Food in the Last Year: Not on file  Transportation Needs:   . Lack of Transportation (Medical): Not on file  . Lack of Transportation (Non-Medical): Not on file  Physical Activity: Unknown  . Days of Exercise per Week: 5 days  . Minutes of Exercise per Session: Not on file  Stress: No Stress Concern Present  . Feeling of Stress : Not at all  Social Connections:   . Frequency of Communication with Friends and Family: Not on file  . Frequency of Social Gatherings with Friends and Family: Not on file  . Attends Religious Services: Not on file  . Active Member of Clubs or Organizations: Not on file  . Attends Archivist Meetings: Not on file  . Marital Status: Not on file    Family History  Problem Relation Age of Onset  . Cancer Mother        lung cancer  . Diabetes Mother   . Hyperlipidemia Mother   . Stroke Father        age 76  . Prostate cancer Father   . Cancer Sister        ?lymphoma    Review of Systems  Constitutional: Negative for  chills and fever.  Respiratory: Negative for cough, shortness of breath and wheezing.   Cardiovascular: Negative for chest pain and palpitations.  Endocrine: Positive for cold intolerance.       Objective:   Vitals:   09/22/19 0842  BP: (!) 154/84  Pulse: (!) 57  Resp: 16  Temp: 98.2 F (36.8 C)  SpO2: 98%   BP Readings from Last 3 Encounters:  09/22/19 (!) 154/84  08/04/19 Marland Kitchen)  164/82  05/02/19 134/80   Wt Readings from Last 3 Encounters:  09/22/19 202 lb 12.8 oz (92 kg)  08/04/19 205 lb 12.8 oz (93.4 kg)  05/02/19 199 lb 12.8 oz (90.6 kg)   Body mass index is 26.76 kg/m.   Physical Exam         Assessment & Plan:    See Problem List for Assessment and Plan of chronic medical problems.     This visit occurred during the SARS-CoV-2 public health emergency.  Safety protocols were in place, including screening questions prior to the visit, additional usage of staff PPE, and extensive cleaning of exam room while observing appropriate contact time as indicated for disinfecting solutions.

## 2019-09-22 ENCOUNTER — Encounter: Payer: Self-pay | Admitting: Internal Medicine

## 2019-09-22 ENCOUNTER — Ambulatory Visit (INDEPENDENT_AMBULATORY_CARE_PROVIDER_SITE_OTHER): Payer: Medicare HMO | Admitting: Internal Medicine

## 2019-09-22 ENCOUNTER — Other Ambulatory Visit: Payer: Self-pay

## 2019-09-22 ENCOUNTER — Ambulatory Visit (INDEPENDENT_AMBULATORY_CARE_PROVIDER_SITE_OTHER): Payer: Medicare HMO

## 2019-09-22 DIAGNOSIS — I10 Essential (primary) hypertension: Secondary | ICD-10-CM

## 2019-09-22 DIAGNOSIS — R0781 Pleurodynia: Secondary | ICD-10-CM | POA: Insufficient documentation

## 2019-09-22 NOTE — Assessment & Plan Note (Signed)
Acute Started 1-2 months ago - ? From walking his large dog - he may have pulled him too much Right sided right pain - worse with twisting, stretching and movement, not with deep breaths. Likely MSK related It is getting better, but will check rib/cxr today Tylenol prn, nsaids prn - do not take regularly, heat, topical arthritis meds as needed Call if pain does not continue to improve

## 2019-09-22 NOTE — Patient Instructions (Signed)
  An x-ray was ordered - this will be done downstairs.     Take tylenol as needed.  You can take back and body if needed. Do not take it regularly.  Apply heat and topical medications.

## 2019-09-22 NOTE — Assessment & Plan Note (Signed)
Chronic Has white coat htn - BP well controlled at home Continue current medication at current dose Continue to monitor BP at home

## 2019-09-29 ENCOUNTER — Telehealth: Payer: Self-pay | Admitting: Internal Medicine

## 2019-09-29 NOTE — Telephone Encounter (Signed)
Patient states he has an appt for COVID 19 vac this Thursday.  States he needs a letter stating it will be ok for him to have this Vac from Dr. Quay Burow.

## 2019-09-29 NOTE — Telephone Encounter (Signed)
Spoke with pt to clarify. He was told due to him being on blood thinner and etc he should get a note from you to approve the vaccine. Ok to write?

## 2019-09-29 NOTE — Telephone Encounter (Signed)
error 

## 2019-09-29 NOTE — Telephone Encounter (Signed)
Okay to write

## 2019-09-30 NOTE — Telephone Encounter (Signed)
Generated letter notified pt letter ready for p/u.Marland KitchenJohny Chess

## 2019-10-09 ENCOUNTER — Telehealth: Payer: Self-pay | Admitting: Internal Medicine

## 2019-10-09 DIAGNOSIS — I10 Essential (primary) hypertension: Secondary | ICD-10-CM

## 2019-10-09 NOTE — Chronic Care Management (AMB) (Signed)
  Chronic Care Management   Note  10/09/2019 Name: Austin [name] MRN: [id number] DOB: 10/18/1945  Austin [name] is a 74 y.o. year old male who is a primary care patient of Burns, Claudina Lick, MD. I reached out to [name] by phone today in response to a referral sent by Austin [name]'s PCP, Binnie Rail, MD.   Austin [name] was given information about Chronic Care Management services today including:  1. CCM service includes personalized support from designated clinical staff supervised by his physician, including individualized plan of care and coordination with other care providers 2. 24/7 contact phone numbers for assistance for urgent and routine care needs. 3. Service will only be billed when office clinical staff spend 20 minutes or more in a month to coordinate care. 4. Only one practitioner may furnish and bill the service in a calendar month. 5. The patient may stop CCM services at any time (effective at the end of the month) by phone call to the office staff. 6. The patient will be responsible for cost sharing (co-pay) of up to 20% of the service fee (after annual deductible is met).  Patient agreed to services and verbal consent obtained.   Follow up plan:   Austin [name] [employer] Scheduler

## 2019-10-13 NOTE — Chronic Care Management (AMB) (Signed)
Chronic Care Management Pharmacy  Name: Austin [name]  MRN: [id number] DOB: [birth year]  Chief Complaint/ HPI  [name],  74 y.o. , male presents for their Initial CCM visit with the clinical pharmacist via telephone.  PCP : Binnie Rail, MD   Their chronic conditions include: HTN, CAD, BPH, HLD, prediabetes Hx - MI 2012 tx'd PCI w/ BMS, 2017 PCI w/ DES  Office Visits: 09/22/19 Dr Quay Burow OV: acute visit for pain on R side. Rec'd Tylenol, NSAIDs prn, not regularly. Pt has known white coat HTN. No med changes.  Consult Visit: 08/04/19 Dr Burt Knack (cardiology): remains on DAPT d/t hx of recurrent ACS. Intolerant to Zetia and other lipid-lowering therapies. Followed through [facility] trial (new cholesterol drug).   Medications: Outpatient Encounter Medications as of 10/14/2019  Medication Sig  . amLODipine (NORVASC) 10 MG tablet Take 1 tablet (10 mg total) by mouth daily.  Marland Kitchen aspirin 81 MG tablet Take 1 tablet (81 mg total) by mouth daily.  . clopidogrel (PLAVIX) 75 MG tablet TAKE 1 TABLET EVERY DAY  . dorzolamide-timolol (COSOPT) 22.3-6.8 MG/ML ophthalmic solution Place 1 drop into both eyes 2 (two) times daily.   . finasteride (PROSCAR) 5 MG tablet TAKE 1 TABLET EVERY DAY  . latanoprost (XALATAN) 0.005 % ophthalmic solution Place 1 drop into both eyes at bedtime.  . nitroGLYCERIN (NITROSTAT) 0.4 MG SL tablet Place 1 tablet (0.4 mg total) under the tongue every 5 (five) minutes as needed for chest pain.   No facility-administered encounter medications on file as of 10/14/2019.    Current Diagnosis/Assessment:  Goals Addressed            This Visit's Progress   . Patient Stated       Current Barriers:  . Chronic Disease Management support, education, and care coordination needs related to CAD, HTN, and HLD  Pharmacist Clinical Goal(s):  Marland Kitchen Obtain LDL < 100 . Maintain BP < 140/90 . Maintain medication compliance side-effect free  Interventions: . Comprehensive  medication review performed. . Eat eggs in moderation, try Egg Beaters or egg whites  Patient Self Care Activities:  . Self administers medications as prescribed, Calls pharmacy for medication refills, and Calls provider office for new concerns or questions  Initial goal documentation      Hypertension   BP today is:  <140/90  Office blood pressures are  BP Readings from Last 3 Encounters:  09/22/19 (!) 154/84  08/04/19 (!) 164/82  05/02/19 134/80    Patient has failed these meds in the past: doxazosin, metoprolol   Patient is currently controlled on the following medications: amlodipine 10 mg daily  Patient checks BP at home 3-5x per week  Patient home BP readings are ranging: 132/84   We discussed diet and exercise extensively, patient is a self-proclaimed "fitness freak", he walks on treadmill 3 days a week and uses free weights as well.   Plan  Continue current medications and control with diet and exercise    Hyperlipidemia/CAD   Lipid Panel     Component Value Date/Time   CHOL 255 (H) 02/20/2018 1157   TRIG 92.0 02/20/2018 1157   HDL 45.80 02/20/2018 1157   CHOLHDL 6 02/20/2018 1157   VLDL 18.4 02/20/2018 1157   LDLCALC 191 (H) 02/20/2018 1157    Patient has failed these meds in past: rosuvastatin, pravastatin (myalgias), ezetimibe  Patient is currently uncontrolled on the following medications: clopidogrel 75 mg daily, aspirin 81 mg daily, nitroglycerin 0.4 mg SL prn  We discussed:  diet and exercise extensively. His diet includes many vegetables, he does eat eggs most mornings. Pt reports he is currently enrolled in the [place] trial for new PCSK9 inhibitor. He of course does not know if he is receiving active drug or placebo.  Plan  Continue current medications and control with diet and exercise  Decrease egg consumption   Prediabetes   Recent Relevant Labs: Lab Results  Component Value Date/Time   HGBA1C 6.1 02/20/2018 11:57 AM   HGBA1C 6.0  06/14/2017 10:03 AM    No medication indicated.  We discussed: diet and exercise extensively  Plan  Continue current medications and control with diet and exercise    BPH   Patient has failed these meds in past: n/a Patient is currently controlled on the following medications: finasteride 5 mg daily  We discussed: patient denies urinary symptoms  Plan  Continue current medications   Glaucoma   Patient has failed these meds in past: n/a Patient is currently controlled on the following medications: dorzolamide-timolol drops, latanoprost drops  We discussed: pt endorses compliance with 2 eye drops  Plan  Continue current medications   Medication Management   Pt uses [employer] mail order and [place] for all medications Pt keeps all medication vials in a container at his bedside, he takes all medications at once after breakfast Pt endorses 100% compliance  We discussed:  Patient reports his medications cost him "nothing" through mail order, he sometimes gets things filled at [facility] to "break up monotony" and it will cost him < $20.  Plan  Continue current medications     Follow up: 6 month phone visit  Charlene Brooke, PharmD Clinical Pharmacist [facility] [phone]

## 2019-10-14 ENCOUNTER — Ambulatory Visit: Payer: Medicare HMO | Admitting: Pharmacist

## 2019-10-14 ENCOUNTER — Other Ambulatory Visit: Payer: Self-pay

## 2019-10-14 DIAGNOSIS — I10 Essential (primary) hypertension: Secondary | ICD-10-CM

## 2019-10-14 DIAGNOSIS — E785 Hyperlipidemia, unspecified: Secondary | ICD-10-CM

## 2019-10-14 DIAGNOSIS — I2510 Atherosclerotic heart disease of native coronary artery without angina pectoris: Secondary | ICD-10-CM

## 2019-10-14 DIAGNOSIS — R7303 Prediabetes: Secondary | ICD-10-CM

## 2019-10-14 NOTE — Addendum Note (Signed)
Addended by: Karle Barr on: 10/14/2019 05:31 AM   Modules accepted: Orders

## 2019-10-14 NOTE — Progress Notes (Signed)
  I have reviewed this encounter including the documentation in this note and/or discussed this patient with the care management provider. I am certifying that I agree with the content of this note as supervising physician.  Binnie Rail, MD   10/14/2019

## 2019-10-14 NOTE — Patient Instructions (Addendum)
Visit Information  Thank you for meeting with me to discuss your medications! I look forward to working with you to achieve your health care goals. Below is a summary of what we talked about during the visit:  Goals Addressed            This Visit's Progress   . Pharmacy Care Plan       Current Barriers:  . Chronic Disease Management support, education, and care coordination needs related to CAD, HTN, and HLD  Pharmacist Clinical Goal(s):  Marland Kitchen Obtain LDL < 100 . Maintain BP < 140/90 . Maintain medication compliance side-effect free  Interventions: . Comprehensive medication review performed. . Eat eggs in moderation, try Egg Beaters or egg whites  Patient Self Care Activities:  . Self administers medications as prescribed, Calls pharmacy for medication refills, and Calls provider office for new concerns or questions  Initial goal documentation    Austin [name] was given information about Chronic Care Management services today including:  1. CCM service includes personalized support from designated clinical staff supervised by his physician, including individualized plan of care and coordination with other care providers 2. 24/7 contact phone numbers for assistance for urgent and routine care needs. 3. Service will only be billed when office clinical staff spend 20 minutes or more in a month to coordinate care. 4. Only one practitioner may furnish and bill the service in a calendar month. 5. The patient may stop CCM services at any time (effective at the end of the month) by phone call to the office staff. 6. The patient will be responsible for cost sharing (co-pay) of up to 20% of the service fee (after annual deductible is met).  Patient agreed to services and verbal consent obtained.   Print copy of patient instructions provided.  Telephone follow up appointment with pharmacy team member scheduled for:   Charlene Brooke, PharmD Clinical Pharmacist [facility] at  [facility] [phone]   Cholesterol Content in Foods Cholesterol is a waxy, fat-like substance that helps to carry fat in the blood. The body needs cholesterol in small amounts, but too much cholesterol can cause damage to the arteries and heart. Most people should eat less than 200 milligrams (mg) of cholesterol a day. Foods with cholesterol  Cholesterol is found in animal-based foods, such as meat, seafood, and dairy. Generally, low-fat dairy and lean meats have less cholesterol than full-fat dairy and fatty meats. The milligrams of cholesterol per serving (mg per serving) of common cholesterol-containing foods are listed below. Meat and other proteins  Egg -- one large whole egg has 186 mg.  Veal shank -- 4 oz has 141 mg.  Lean ground [place] (93% lean) -- 4 oz has 118 mg.  Fat-trimmed lamb loin -- 4 oz has 106 mg.  Lean ground beef (90% lean) -- 4 oz has 100 mg.  Lobster -- 3.5 oz has 90 mg.  Pork loin chops -- 4 oz has 86 mg.  Canned salmon -- 3.5 oz has 83 mg.  Fat-trimmed beef top loin -- 4 oz has 78 mg.  Frankfurter -- 1 frank (3.5 oz) has 77 mg.  Crab -- 3.5 oz has 71 mg.  Roasted chicken without skin, white meat -- 4 oz has 66 mg.  Light bologna -- 2 oz has 45 mg.  Deli-cut [place] -- 2 oz has 31 mg.  Canned tuna -- 3.5 oz has 31 mg.  Berniece Salines -- 1 oz has 29 mg.  Oysters and mussels (raw) --  3.5 oz has 25 mg.  Mackerel -- 1 oz has 22 mg.  Trout -- 1 oz has 20 mg.  Pork sausage -- 1 link (1 oz) has 17 mg.  Salmon -- 1 oz has 16 mg.  Tilapia -- 1 oz has 14 mg. Dairy  Soft-serve ice cream --  cup (4 oz) has 103 mg.  Whole-milk yogurt -- 1 cup (8 oz) has 29 mg.  Cheddar cheese -- 1 oz has 28 mg.  American cheese -- 1 oz has 28 mg.  Whole milk -- 1 cup (8 oz) has 23 mg.  2% milk -- 1 cup (8 oz) has 18 mg.  Cream cheese -- 1 tablespoon (Tbsp) has 15 mg.  Cottage cheese --  cup (4 oz) has 14 mg.  Low-fat (1%) milk -- 1 cup (8 oz) has 10  mg.  Sour cream -- 1 Tbsp has 8.5 mg.  Low-fat yogurt -- 1 cup (8 oz) has 8 mg.  Nonfat Greek yogurt -- 1 cup (8 oz) has 7 mg.  Half-and-half cream -- 1 Tbsp has 5 mg. Fats and oils  Cod liver oil -- 1 tablespoon (Tbsp) has 82 mg.  Butter -- 1 Tbsp has 15 mg.  Lard -- 1 Tbsp has 14 mg.  Bacon grease -- 1 Tbsp has 14 mg.  Mayonnaise -- 1 Tbsp has 5-10 mg.  Margarine -- 1 Tbsp has 3-10 mg. Exact amounts of cholesterol in these foods may vary depending on specific ingredients and brands. Foods without cholesterol Most plant-based foods do not have cholesterol unless you combine them with a food that has cholesterol. Foods without cholesterol include:  Grains and cereals.  Vegetables.  Fruits.  Vegetable oils, such as olive, canola, and sunflower oil.  Legumes, such as peas, beans, and lentils.  Nuts and seeds.  Egg whites. Summary  The body needs cholesterol in small amounts, but too much cholesterol can cause damage to the arteries and heart.  Most people should eat less than 200 milligrams (mg) of cholesterol a day. This information is not intended to replace advice given to you by your health care provider. Make sure you discuss any questions you have with your health care provider. Document Revised: 07/27/2017 Document Reviewed: 04/10/2017 Elsevier Patient Education  [place].

## 2019-10-20 DIAGNOSIS — C8213 Follicular lymphoma grade II, intra-abdominal lymph nodes: Secondary | ICD-10-CM | POA: Diagnosis not present

## 2019-10-20 DIAGNOSIS — C8290 Follicular lymphoma, unspecified, unspecified site: Secondary | ICD-10-CM | POA: Diagnosis not present

## 2019-10-30 ENCOUNTER — Other Ambulatory Visit: Payer: Self-pay | Admitting: Cardiovascular Disease

## 2019-11-25 ENCOUNTER — Encounter: Payer: Medicare HMO | Admitting: *Deleted

## 2019-11-25 ENCOUNTER — Other Ambulatory Visit: Payer: Self-pay

## 2019-11-25 DIAGNOSIS — Z006 Encounter for examination for normal comparison and control in clinical research program: Secondary | ICD-10-CM

## 2019-11-25 NOTE — Research (Signed)
Subject to research clinic for visit [place] 4 research study.  No cos, aes or saes to report.  Injection given, subject tolerated well.  Next clinic visit scheduled.

## 2019-12-15 DIAGNOSIS — M1712 Unilateral primary osteoarthritis, left knee: Secondary | ICD-10-CM | POA: Diagnosis not present

## 2019-12-16 ENCOUNTER — Telehealth: Payer: Self-pay | Admitting: Internal Medicine

## 2019-12-16 NOTE — Telephone Encounter (Signed)
    Patient seeking advice for constant case of the hiccups. Please advise

## 2019-12-17 NOTE — Progress Notes (Signed)
Subjective:    Patient ID: Austin [name], male    DOB: 07-Jul-1946, 74 y.o.   MRN: [id number]  HPI The patient is here for an acute visit.   Excessive hiccups:   Yesterday he started having hiccups when he was doing yardwork.  They started after having sneezing and increased symptoms from the pollen.  They persisted for 2-3 hours.  They slowly died down.  He called for an appointment yesterday.  He has not had any hiccups since then.  He was nervous he would have another experience like a few years ago when he had hiccups for days.  That occurred when he had the flu and pneumonia.   He otherwise feels good.  He denies cold symptoms.  He denies GERD daily, but does have it if he drinks or eats certain foods.  He drinks OJ daily with his medications in the morning and that will often cause it.  He does not take anything for it.      Nose bleed that come and goes.  It stops quickly.       Medications and allergies reviewed with patient and updated if appropriate.  Patient Active Problem List   Diagnosis Date Noted  . Rib pain on right side 09/22/2019  . Syncope   . Prediabetes 08/28/2016  . CAD (coronary artery disease) 12/13/2010  . Hyperlipidemia 11/12/2010  . Allergic rhinitis 11/12/2010  . 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 Hematuria 06/22/2009  . Essential hypertension 11/26/2008  . NEPHROLITHIASIS, HX OF 06/26/2007  . History of follicular lymphoma [id number]    Current Outpatient Medications on File Prior to Visit  Medication Sig Dispense Refill  . amLODipine (NORVASC) 10 MG tablet TAKE 1 TABLET EVERY DAY 90 tablet 2  . aspirin 81 MG tablet Take 1 tablet (81 mg total) by mouth daily. 30 tablet   . clopidogrel (PLAVIX) 75 MG tablet TAKE 1 TABLET EVERY DAY 90 tablet 2  . dorzolamide-timolol (COSOPT) 22.3-6.8 MG/ML ophthalmic solution Place 1 drop into both eyes 2 (two) times daily.     . finasteride (PROSCAR) 5 MG tablet TAKE 1 TABLET EVERY  DAY 90 tablet 2  . latanoprost (XALATAN) 0.005 % ophthalmic solution Place 1 drop into both eyes at bedtime.    . nitroGLYCERIN (NITROSTAT) 0.4 MG SL tablet Place 1 tablet (0.4 mg total) under the tongue every 5 (five) minutes as needed for chest pain. 25 tablet 11   No current facility-administered medications on file prior to visit.    Past Medical History:  Diagnosis Date  . Allergy   . Arthritis   . CAP (community acquired pneumonia)   . Cataract    bilateral  . Coronary artery disease    03/13/2016 DES to first OM, EF 55% // Myoview 12/19: EF 55, ant-lat infarction, no significant ischemia, Low Risk  . Heart murmur    "outgrew it" (03/13/2016)  . Hematuria   . HTN (hypertension)   . Hx of cardiovascular stress test    ETT-Myoview (10/15): Nondiagnostic EKG changes, anterolateral and inferolateral scar, no ischemia, EF 48%, low risk  . Hyperlipemia   . Hyperlipidemia    NMR Lipoprofile 2008: LDL 168 ( 2410/ 1826), HDL 41, TG 71. LDL goal = <100, ideally <  70. Framingham Study LDL goal = < 130.  Marland Kitchen Kidney stones    "passed them"; Dr. Terance Hart (03/13/2016)  . Myocardial infarction ([place]) 2001  . [facility] Hodgkin's [facility]  . Pleural effusion   . TIA (transient ischemic attack) 1980s   "I was exercising when I had it"    Past Surgical History:  Procedure Laterality Date  . CARDIAC CATHETERIZATION N/A 12/22/2015   Procedure: Left Heart Cath and Coronary Angiography;  Surgeon: Sherren Mocha, MD;  Location: [place] [facility];  Service: Cardiovascular;  Laterality: N/A;  . CARDIAC CATHETERIZATION N/A 12/22/2015   Procedure: Coronary Stent Intervention;  Surgeon: Sherren Mocha, MD;  Location: [place] [facility];  Service: Cardiovascular;  Laterality: N/A;  . CARDIAC CATHETERIZATION N/A 12/22/2015   Procedure: Intravascular Pressure Wire/FFR Study;  Surgeon: Sherren Mocha, MD;  Location: [place] [facility];  Service: Cardiovascular;  Laterality: N/A;  .  CARDIAC CATHETERIZATION N/A 03/13/2016   Procedure: Left Heart Cath and Coronary Angiography;  Surgeon: Sherren Mocha, MD;  Location: [place] [facility];  Service: Cardiovascular;  Laterality: N/A;  . COLONOSCOPY  2002  . CORONARY ANGIOPLASTY WITH STENT PLACEMENT  ~ 2013   "1 stent"  . ESOPHAGOGASTRODUODENOSCOPY (EGD) WITH ESOPHAGEAL DILATION  2002  . KNEE ARTHROSCOPY Left 1990s    Social History   Socioeconomic History  . Marital status: Married    Spouse name: Not on file  . Number of children: 3  . Years of education: Not on file  . Highest education level: Not on file  Occupational History  . Occupation: retired Higher education careers adviser  Tobacco Use  . Smoking status: Never Smoker  . Smokeless tobacco: Never Used  Substance and Sexual Activity  . Alcohol use: No    Comment: drank some in my 20's"  . Drug use: No  . Sexual activity: Yes  Other Topics Concern  . Not on file  Social History Narrative  . Not on file   Social Determinants of Health   Financial Resource Strain:   . Difficulty of Paying Living Expenses:   Food Insecurity:   . Worried About Charity fundraiser in the Last Year:   . Arboriculturist in the Last Year:   Transportation Needs:   . Film/video editor (Medical):   Marland Kitchen Lack of Transportation (Non-Medical):   Physical Activity: Unknown  . Days of Exercise per Week: 5 days  . Minutes of Exercise per Session: Not on file  Stress: No Stress Concern Present  . Feeling of Stress : Not at all  Social Connections:   . Frequency of Communication with Friends and Family:   . Frequency of Social Gatherings with Friends and Family:   . Attends Religious Services:   . Active Member of Clubs or Organizations:   . Attends Archivist Meetings:   Marland Kitchen Marital Status:     Family History  Problem Relation Age of Onset  . Cancer Mother        lung cancer  . Diabetes Mother   . Hyperlipidemia Mother   . Stroke Father        age 92  . Prostate cancer Father     . Cancer Sister        ?lymphoma    Review of Systems  Constitutional: Negative for chills and fever.  HENT: Positive for trouble swallowing (rare). Negative for congestion, ear pain and sinus pain.        No sneezing  Respiratory: Negative for cough, shortness of breath and wheezing.   Cardiovascular: Negative for chest pain and palpitations.  Gastrointestinal: Negative for abdominal pain.       GERD in  morning when drinking OJ or certain foods - occurs once a week or less  Musculoskeletal: Positive for arthralgias.  Neurological: Negative for dizziness, weakness, light-headedness, numbness and headaches.       Objective:   Vitals:   12/18/19 0944  BP: (!) 148/72  Pulse: 73  Resp: 16  Temp: 98.2 F (36.8 C)  SpO2: 99%   BP Readings from Last 3 Encounters:  12/18/19 (!) 148/72  09/22/19 (!) 154/84  08/04/19 (!) 164/82   Wt Readings from Last 3 Encounters:  12/18/19 205 lb (93 kg)  09/22/19 202 lb 12.8 oz (92 kg)  08/04/19 205 lb 12.8 oz (93.4 kg)   Body mass index is 27.05 kg/m.   Physical Exam    GENERAL APPEARANCE: Appears stated age, well appearing, NAD EYES: conjunctiva clear, no icterus HEENT: bilateral tympanic membranes and ear canals normal, oropharynx with no erythema, no thyromegaly, trachea midline, no cervical or supraclavicular lymphadenopathy LUNGS: Clear to auscultation without wheeze or crackles, unlabored breathing, good air entry bilaterally CARDIOVASCULAR: Normal S1,S2 without murmurs, no edema Abdomen: soft, NT, ND SKIN: Warm, dry      Assessment & Plan:   20 minutes were spent face-to-face with the patient, most of the time spent discussing GERD including causes, treatment and consequences of untreated reflux     See Problem List for Assessment and Plan of chronic medical problems.    This visit occurred during the SARS-CoV-2 public health emergency.  Safety protocols were in place, including screening questions prior to the  visit, additional usage of staff PPE, and extensive cleaning of exam room while observing appropriate contact time as indicated for disinfecting solutions.

## 2019-12-17 NOTE — Telephone Encounter (Signed)
LVM. Pt needs an appointment

## 2019-12-18 ENCOUNTER — Other Ambulatory Visit: Payer: Self-pay

## 2019-12-18 ENCOUNTER — Ambulatory Visit (INDEPENDENT_AMBULATORY_CARE_PROVIDER_SITE_OTHER): Payer: Medicare HMO | Admitting: Internal Medicine

## 2019-12-18 ENCOUNTER — Encounter: Payer: Self-pay | Admitting: Internal Medicine

## 2019-12-18 DIAGNOSIS — K219 Gastro-esophageal reflux disease without esophagitis: Secondary | ICD-10-CM | POA: Insufficient documentation

## 2019-12-18 DIAGNOSIS — R066 Hiccough: Secondary | ICD-10-CM | POA: Diagnosis not present

## 2019-12-18 NOTE — Assessment & Plan Note (Signed)
Acute Resolved Started yesterday with allergy symptoms - lasted over 2 hours, but none since then Triggered by allergies, no concerning symptoms No further evaluation needed at this time He will let me know if they recur and will call in a medication if needed

## 2019-12-18 NOTE — Patient Instructions (Signed)
Let me know if the hiccups return.

## 2019-12-18 NOTE — Assessment & Plan Note (Addendum)
Acute Intermittent - related to foods he drinks/eats Discussed revising diet and some foods he should avoid tums as needed Does not need a daily medication at this time, but may if GERD does not improve Discussed concerns with uncontrolled GERD and affect on swallowing

## 2020-01-20 DIAGNOSIS — M25562 Pain in left knee: Secondary | ICD-10-CM | POA: Diagnosis not present

## 2020-01-23 ENCOUNTER — Telehealth: Payer: Self-pay | Admitting: Cardiovascular Disease

## 2020-01-23 NOTE — Telephone Encounter (Signed)
° °  ° °  [facility] Pre-operative Risk Assessment    HEARTCARE STAFF: - Please ensure there is not already an duplicate clearance open for this procedure. - Under Visit Info/Reason for Call, type in Other and utilize the format Clearance MM/DD/YY or Clearance TBD. Do not use dashes or single digits. - If request is for dental extraction, please clarify the # of teeth to be extracted.  Request for surgical clearance:  1. What type of surgery is being performed? Left Knee orthoscopy   2. When is this surgery scheduled? TBD   3. What type of clearance is required (medical clearance vs. Pharmacy clearance to hold med vs. Both)? Both  4. Are there any medications that need to be held prior to surgery and how long? Any necessary meds need to held before surgery  5. Practice name and name of physician performing surgery? Dr. Dorna Leitz - [facility]  6. What is the office phone number? [phone]   7.   What is the office fax number? [phone]  8.   Anesthesia type (None, local, MAC, general) ? Choice   Austin [name] 01/23/2020, 2:23 PM  _________________________________________________________________   (provider comments below)

## 2020-01-27 ENCOUNTER — Telehealth: Payer: Self-pay | Admitting: Cardiovascular Disease

## 2020-01-27 NOTE — Telephone Encounter (Signed)
Ortho called to check if fax was received for clearance.

## 2020-01-28 NOTE — Telephone Encounter (Signed)
   Primary Cardiologist: Sherren Mocha, MD  Chart reviewed as part of pre-operative protocol coverage. Patient was contacted 01/28/2020 in reference to pre-operative risk assessment for pending surgery as outlined below.  ERON TRICARICO was last seen on 08/04/2019 by Dr. Burt Knack.  Since that day, RUSTIN MCGONIGLE has done well without chest pain or shortness of breath.  Therefore, based on ACC/AHA guidelines, the patient would be at acceptable risk for the planned procedure without further cardiovascular testing.   I will route this recommendation to the requesting party via Epic fax function and remove from pre-op pool. Please call with questions.  Dr. Burt Knack, please comment on whether you are ok for the patient to come off of plavix for 5 days prior to the surgery. He should continue the aspirin through the surgery. Please route your response to P CV [place], PA 01/28/2020, 2:47 PM

## 2020-01-29 NOTE — Telephone Encounter (Signed)
Reviewed recommendations to patient who verbalized understanding and thanked me for the call.

## 2020-01-29 NOTE — Telephone Encounter (Signed)
Yes. Last PCI 2017 - At low risk of holding clopidogrel x 5 days for surgery. OK to do this. thanks

## 2020-01-29 NOTE — Telephone Encounter (Signed)
I have forwarded the clearance to the surgeon's office, callback pool please inform the patient to hold plavix for 5 days prior to surgery and restart at the earliest time at the surgeon's discretion. He should continue the aspirin through the surgery.

## 2020-02-11 DIAGNOSIS — M948X6 Other specified disorders of cartilage, lower leg: Secondary | ICD-10-CM | POA: Diagnosis not present

## 2020-02-11 DIAGNOSIS — S83232A Complex tear of medial meniscus, current injury, left knee, initial encounter: Secondary | ICD-10-CM | POA: Diagnosis not present

## 2020-02-11 DIAGNOSIS — M94262 Chondromalacia, left knee: Secondary | ICD-10-CM | POA: Diagnosis not present

## 2020-02-11 DIAGNOSIS — M2342 Loose body in knee, left knee: Secondary | ICD-10-CM | POA: Diagnosis not present

## 2020-02-17 DIAGNOSIS — S83242D Other tear of medial meniscus, current injury, left knee, subsequent encounter: Secondary | ICD-10-CM | POA: Diagnosis not present

## 2020-02-23 DIAGNOSIS — S83242D Other tear of medial meniscus, current injury, left knee, subsequent encounter: Secondary | ICD-10-CM | POA: Diagnosis not present

## 2020-02-26 DIAGNOSIS — S83242D Other tear of medial meniscus, current injury, left knee, subsequent encounter: Secondary | ICD-10-CM | POA: Diagnosis not present

## 2020-03-03 DIAGNOSIS — S83242D Other tear of medial meniscus, current injury, left knee, subsequent encounter: Secondary | ICD-10-CM | POA: Diagnosis not present

## 2020-03-05 DIAGNOSIS — S83242D Other tear of medial meniscus, current injury, left knee, subsequent encounter: Secondary | ICD-10-CM | POA: Diagnosis not present

## 2020-03-09 DIAGNOSIS — S83242D Other tear of medial meniscus, current injury, left knee, subsequent encounter: Secondary | ICD-10-CM | POA: Diagnosis not present

## 2020-03-10 ENCOUNTER — Encounter: Payer: Medicare HMO | Admitting: Internal Medicine

## 2020-03-11 ENCOUNTER — Ambulatory Visit: Payer: Medicare HMO

## 2020-03-11 ENCOUNTER — Ambulatory Visit: Payer: Medicare HMO | Admitting: Internal Medicine

## 2020-04-02 DIAGNOSIS — H401122 Primary open-angle glaucoma, left eye, moderate stage: Secondary | ICD-10-CM | POA: Diagnosis not present

## 2020-04-02 DIAGNOSIS — H401111 Primary open-angle glaucoma, right eye, mild stage: Secondary | ICD-10-CM | POA: Diagnosis not present

## 2020-04-02 DIAGNOSIS — Z0100 Encounter for examination of eyes and vision without abnormal findings: Secondary | ICD-10-CM | POA: Diagnosis not present

## 2020-04-12 NOTE — Chronic Care Management (AMB) (Signed)
Chronic Care Management Pharmacy  Name: Austin [name]  MRN: [id number] DOB: 1946-02-11  Chief Complaint/ HPI  [name],  74 y.o. , male presents for their Follow-Up CCM visit with the clinical pharmacist via telephone.  PCP : Binnie Rail, MD   Their chronic conditions include: Hypertension, Hyperlipidemia, Coronary Artery Disease, BPH and Prediabetes  Office Visits: 12/18/19 Dr Quay Burow OV: f/u GERD, hiccups. No med changes.  09/22/19 Dr Quay Burow OV: acute visit for pain on R side. Rec'd Tylenol, NSAIDs prn, not regularly. Pt has known white coat HTN. No med changes.  Consult Visit: 12/15/19 Dr Berenice Primas (orthopedic surgery): f/u for L knee osteoarthritis.  11/25/19 RN Melissa Noon (research study): injection given.   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 Intolerant to Zetia and other lipid-lowering therapies. Followed through [facility] trial (new cholesterol drug).   Medications: Outpatient Encounter Medications as of 04/13/2020  Medication Sig  . amLODipine (NORVASC) 10 MG tablet TAKE 1 TABLET EVERY DAY  . aspirin 81 MG tablet Take 1 tablet (81 mg total) by mouth daily.  . clopidogrel (PLAVIX) 75 MG tablet TAKE 1 TABLET EVERY DAY  . dorzolamide-timolol (COSOPT) 22.3-6.8 MG/ML ophthalmic solution Place 1 drop into both eyes 2 (two) times daily.   . finasteride (PROSCAR) 5 MG tablet TAKE 1 TABLET EVERY DAY  . latanoprost (XALATAN) 0.005 % ophthalmic solution Place 1 drop into both eyes at bedtime.  . nitroGLYCERIN (NITROSTAT) 0.4 MG SL tablet Place 1 tablet (0.4 mg total) under the tongue every 5 (five) minutes as needed for chest pain.   No facility-administered encounter medications on file as of 04/13/2020.    Current Diagnosis/Assessment:  SDOH Interventions     Most Recent Value  SDOH Interventions  Financial Strain Interventions Intervention Not Indicated     Goals Addressed            This Visit's Progress   . Pharmacy  Care Plan       CARE PLAN ENTRY (see longitudinal plan of care for additional care plan information)  Current Barriers:  . Chronic Disease Management support, education, and care coordination needs related to Hypertension, Hyperlipidemia, and Coronary Artery Disease   Hypertension BP Readings from Last 3 Encounters:  12/18/19 (!) 148/72  09/22/19 (!) 154/84  08/04/19 (!) 164/82 .  Pharmacist Clinical Goal(s): o Over the next 180 days, patient will work with PharmD and providers to maintain BP goal <140/90 . Current regimen:  o Amlodipine 10 mg daily . Interventions: o Discussed BP goals and benefits of medication for prevention of heart attack / stroke . Patient self care activities - Over the next 180 days, patient will: o Check BP 3-5 times weekly, document, and provide at future appointments o Ensure daily salt intake < 2300 mg/day  Hyperlipidemia / CAD Lab Results  Component Value Date/Time   LDLCALC 191 (H) 02/20/2018 11:57 AM   LDLDIRECT 173.2 07/21/2013 08:09 AM .  Pharmacist Clinical Goal(s): o Over the next 180 days, patient will work with PharmD and providers to maintain LDL goal < 70 . Current regimen:  o clopidogrel 75 mg daily,  o aspirin 81 mg daily,  o nitroglycerin 0.4 mg as needed o [employer] study participant (PCSK9-inhibitor vs placebo) . Interventions: o Discussed cholesterol goals and benefits of medication for prevention of heart attack / stroke o Educated on cholesterol content in foods (particularly eggs) . Patient self care activities - Over the next 180 days, patient will:  o Continue medications as prescribed o Reduce intake of high-cholesterol foods  Medication management . Pharmacist Clinical Goal(s): o Over the next 180 days, patient will work with PharmD and providers to maintain optimal medication adherence . Current pharmacy: [facility] mail order . Interventions o Comprehensive medication review performed. o Continue current medication  management strategy . Patient self care activities - Over the next 180 days, patient will: o Focus on medication adherence by fill date o Take medications as prescribed o Report any questions or concerns to PharmD and/or provider(s)  Please see past updates related to this goal by clicking on the "Past Updates" button in the selected goal        Hypertension   BP goal is:  <140/90  Office blood pressures are  BP Readings from Last 3 Encounters:  12/18/19 (!) 148/72  09/22/19 (!) 154/84  08/04/19 (!) 164/82   Kidney Function Lab Results  Component Value Date/Time   CREATININE 1.12 04/17/2019 04:12 PM   CREATININE 1.13 03/14/2018 02:27 PM   CREATININE 1.08 03/09/2016 04:31 PM   CREATININE 0.85 12/20/2015 12:13 PM   GFR 81.75 03/14/2018 02:27 PM   GFRNONAA >60 04/17/2019 04:12 PM   GFRAA >60 04/17/2019 04:12 PM   K 4.4 04/17/2019 04:12 PM   K 4.6 03/14/2018 02:27 PM   Patient checks BP at home 3-5x per week  Patient home BP readings are ranging: 132/84   Patient has failed these meds in the past: doxazosin, metoprolol  Patient is currently controlled on the following medications:   amlodipine 10 mg daily  We discussed: pt endorses compliance and denies side effects  Plan  Continue current medications and control with diet and exercise    Hyperlipidemia/CAD   LDL goal < 70 CAD: MI in 2012 treated with PCI of the LCx with a BMS PCI with DES to the proximal LAD in 11/2015  Lipid Panel     Component Value Date/Time   CHOL 255 (H) 02/20/2018 1157   TRIG 92.0 02/20/2018 1157   HDL 45.80 02/20/2018 1157   CHOLHDL 6 02/20/2018 1157   VLDL 18.4 02/20/2018 1157   LDLCALC 191 (H) 02/20/2018 1157   LDLDIRECT 173.2 07/21/2013 0809   Patient has failed these meds in past: rosuvastatin, pravastatin (myalgias), ezetimibe  Patient is currently uncontrolled on the following medications:   clopidogrel 75 mg daily,   aspirin 81 mg daily,   nitroglycerin 0.4 mg SL  prn  We discussed:  Pt is still enrolled in [place] trial (Inclisiran, a new PCSK9-inhibitor) and is supposed to be in it for at least a year, he has been getting injections for about 8 months now.   Plan  Continue current medications and control with diet and exercise    Medication Management   Pt uses [facility] mail order and [place] for all medications Pt keeps all medication vials in a container at his bedside, he takes all medications at once after breakfast Pt endorses 100% compliance  We discussed: medications are free through [facility], pt is satisfied with pharmacy services  Plan  Continue current medications     Follow up: 6 month phone visit  Charlene Brooke, PharmD Clinical Pharmacist [facility] at [facility] [phone]

## 2020-04-13 ENCOUNTER — Ambulatory Visit: Payer: Medicare HMO | Admitting: Pharmacist

## 2020-04-13 ENCOUNTER — Telehealth: Payer: Medicare HMO

## 2020-04-13 ENCOUNTER — Other Ambulatory Visit: Payer: Self-pay

## 2020-04-13 DIAGNOSIS — E785 Hyperlipidemia, unspecified: Secondary | ICD-10-CM

## 2020-04-13 DIAGNOSIS — I10 Essential (primary) hypertension: Secondary | ICD-10-CM

## 2020-04-13 DIAGNOSIS — I2510 Atherosclerotic heart disease of native coronary artery without angina pectoris: Secondary | ICD-10-CM

## 2020-04-13 NOTE — Patient Instructions (Addendum)
Visit Information  Phone number for Pharmacist: [phone]  Goals Addressed            This Visit's Progress   . Pharmacy Care Plan       CARE PLAN ENTRY (see longitudinal plan of care for additional care plan information)  Current Barriers:  . Chronic Disease Management support, education, and care coordination needs related to Hypertension, Hyperlipidemia, and Coronary Artery Disease   Hypertension BP Readings from Last 3 Encounters:  12/18/19 (!) 148/72  09/22/19 (!) 154/84  08/04/19 (!) 164/82 .  Pharmacist Clinical Goal(s): o Over the next 180 days, patient will work with PharmD and providers to maintain BP goal <140/90 . Current regimen:  o Amlodipine 10 mg daily . Interventions: o Discussed BP goals and benefits of medication for prevention of heart attack / stroke . Patient self care activities - Over the next 180 days, patient will: o Check BP 3-5 times weekly, document, and provide at future appointments o Ensure daily salt intake < 2300 mg/day  Hyperlipidemia / CAD Lab Results  Component Value Date/Time   LDLCALC 191 (H) 02/20/2018 11:57 AM   LDLDIRECT 173.2 07/21/2013 08:09 AM .  Pharmacist Clinical Goal(s): o Over the next 180 days, patient will work with PharmD and providers to maintain LDL goal < 70 . Current regimen:  o clopidogrel 75 mg daily,  o aspirin 81 mg daily,  o nitroglycerin 0.4 mg as needed o [employer] study participant (PCSK9-inhibitor vs placebo) . Interventions: o Discussed cholesterol goals and benefits of medication for prevention of heart attack / stroke o Educated on cholesterol content in foods (particularly eggs) . Patient self care activities - Over the next 180 days, patient will: o Continue medications as prescribed o Reduce intake of high-cholesterol foods  Medication management . Pharmacist Clinical Goal(s): o Over the next 180 days, patient will work with PharmD and providers to maintain optimal medication  adherence . Current pharmacy: [facility] mail order . Interventions o Comprehensive medication review performed. o Continue current medication management strategy . Patient self care activities - Over the next 180 days, patient will: o Focus on medication adherence by fill date o Take medications as prescribed o Report any questions or concerns to PharmD and/or provider(s)  Please see past updates related to this goal by clicking on the "Past Updates" button in the selected goal       Patient verbalizes understanding of instructions provided today.   Telephone follow up appointment with pharmacy team member scheduled for: 6 months  Charlene Brooke, PharmD Clinical Pharmacist [facility] [phone]  Cholesterol Content in Foods Cholesterol is a waxy, fat-like substance that helps to carry fat in the blood. The body needs cholesterol in small amounts, but too much cholesterol can cause damage to the arteries and heart. Most people should eat less than 200 milligrams (mg) of cholesterol a day. Foods with cholesterol  Cholesterol is found in animal-based foods, such as meat, seafood, and dairy. Generally, low-fat dairy and lean meats have less cholesterol than full-fat dairy and fatty meats. The milligrams of cholesterol per serving (mg per serving) of common cholesterol-containing foods are listed below. Meat and other proteins  Egg -- one large whole egg has 186 mg.  Veal shank -- 4 oz has 141 mg.  Lean ground [place] (93% lean) -- 4 oz has 118 mg.  Fat-trimmed lamb loin -- 4 oz has 106 mg.  Lean ground beef (90% lean) -- 4 oz has 100 mg.  Lobster -- 3.5 oz has 90 mg.  Pork loin chops -- 4 oz has 86 mg.  Canned salmon -- 3.5 oz has 83 mg.  Fat-trimmed beef top loin -- 4 oz has 78 mg.  Frankfurter -- 1 frank (3.5 oz) has 77 mg.  Crab -- 3.5 oz has 71 mg.  Roasted chicken without skin, white meat -- 4 oz has 66 mg.  Light bologna -- 2 oz has  45 mg.  Deli-cut [place] -- 2 oz has 31 mg.  Canned tuna -- 3.5 oz has 31 mg.  Berniece Salines -- 1 oz has 29 mg.  Oysters and mussels (raw) -- 3.5 oz has 25 mg.  Mackerel -- 1 oz has 22 mg.  Trout -- 1 oz has 20 mg.  Pork sausage -- 1 link (1 oz) has 17 mg.  Salmon -- 1 oz has 16 mg.  Tilapia -- 1 oz has 14 mg. Dairy  Soft-serve ice cream --  cup (4 oz) has 103 mg.  Whole-milk yogurt -- 1 cup (8 oz) has 29 mg.  Cheddar cheese -- 1 oz has 28 mg.  American cheese -- 1 oz has 28 mg.  Whole milk -- 1 cup (8 oz) has 23 mg.  2% milk -- 1 cup (8 oz) has 18 mg.  Cream cheese -- 1 tablespoon (Tbsp) has 15 mg.  Cottage cheese --  cup (4 oz) has 14 mg.  Low-fat (1%) milk -- 1 cup (8 oz) has 10 mg.  Sour cream -- 1 Tbsp has 8.5 mg.  Low-fat yogurt -- 1 cup (8 oz) has 8 mg.  Nonfat Greek yogurt -- 1 cup (8 oz) has 7 mg.  Half-and-half cream -- 1 Tbsp has 5 mg. Fats and oils  Cod liver oil -- 1 tablespoon (Tbsp) has 82 mg.  Butter -- 1 Tbsp has 15 mg.  Lard -- 1 Tbsp has 14 mg.  Bacon grease -- 1 Tbsp has 14 mg.  Mayonnaise -- 1 Tbsp has 5-10 mg.  Margarine -- 1 Tbsp has 3-10 mg. Exact amounts of cholesterol in these foods may vary depending on specific ingredients and brands. Foods without cholesterol Most plant-based foods do not have cholesterol unless you combine them with a food that has cholesterol. Foods without cholesterol include:  Grains and cereals.  Vegetables.  Fruits.  Vegetable oils, such as olive, canola, and sunflower oil.  Legumes, such as peas, beans, and lentils.  Nuts and seeds.  Egg whites. Summary  The body needs cholesterol in small amounts, but too much cholesterol can cause damage to the arteries and heart.  Most people should eat less than 200 milligrams (mg) of cholesterol a day. This information is not intended to replace advice given to you by your health care provider. Make sure you discuss any questions you have with your  health care provider. Document Revised: 07/27/2017 Document Reviewed: 04/10/2017 Elsevier Patient Education  [employer].

## 2020-04-15 NOTE — Patient Instructions (Addendum)
For your vaccine information -- https://covid-vaccine-portal.ncdhhs.gov/ -- [place] [facility]   Blood work was ordered.    All other Health Maintenance issues reviewed.   All recommended immunizations and age-appropriate screenings are up-to-date or discussed.  No immunization administered today.   Medications reviewed and updated.  Changes include :  none    Please followup in 1 year    Health Maintenance, Male Adopting a healthy lifestyle and getting preventive care are important in promoting health and wellness. Ask your health care provider about:  The right schedule for you to have regular tests and exams.  Things you can do on your own to prevent diseases and keep yourself healthy. What should I know about diet, weight, and exercise? Eat a healthy diet   Eat a diet that includes plenty of vegetables, fruits, low-fat dairy products, and lean protein.  Do not eat a lot of foods that are high in solid fats, added sugars, or sodium. Maintain a healthy weight Body mass index (BMI) is a measurement that can be used to identify possible weight problems. It estimates body fat based on height and weight. Your health care provider can help determine your BMI and help you achieve or maintain a healthy weight. Get regular exercise Get regular exercise. This is one of the most important things you can do for your health. Most adults should:  Exercise for at least 150 minutes each week. The exercise should increase your heart rate and make you sweat (moderate-intensity exercise).  Do strengthening exercises at least twice a week. This is in addition to the moderate-intensity exercise.  Spend less time sitting. Even light physical activity can be beneficial. Watch cholesterol and blood lipids Have your blood tested for lipids and cholesterol at 75 years of age, then have this test every 5 years. You may need to have your cholesterol levels checked more often if:  Your lipid or cholesterol  levels are high.  You are older than 75 years of age.  You are at high risk for heart disease. What should I know about cancer screening? Many types of cancers can be detected early and may often be prevented. Depending on your health history and family history, you may need to have cancer screening at various ages. This may include screening for:  Colorectal cancer.  Prostate cancer.  Skin cancer.  Lung cancer. What should I know about heart disease, diabetes, and high blood pressure? Blood pressure and heart disease  High blood pressure causes heart disease and increases the risk of stroke. This is more likely to develop in people who have high blood pressure readings, are of African descent, or are overweight.  Talk with your health care provider about your target blood pressure readings.  Have your blood pressure checked: ? Every 3-5 years if you are 68-50 years of age. ? Every year if you are 34 years old or older.  If you are between the ages of 24 and 72 and are a current or former smoker, ask your health care provider if you should have a one-time screening for abdominal aortic aneurysm (AAA). Diabetes Have regular diabetes screenings. This checks your fasting blood sugar level. Have the screening done:  Once every three years after age 17 if you are at a normal weight and have a low risk for diabetes.  More often and at a younger age if you are overweight or have a high risk for diabetes. What should I know about preventing infection? Hepatitis B If you have a  higher risk for hepatitis B, you should be screened for this virus. Talk with your health care provider to find out if you are at risk for hepatitis B infection. Hepatitis C Blood testing is recommended for:  Everyone born from [birth year] through 1966.  Anyone with known risk factors for hepatitis C. Sexually transmitted infections (STIs)  You should be screened each year for STIs, including gonorrhea and  chlamydia, if: ? You are sexually active and are younger than 75 years of age. ? You are older than 75 years of age and your health care provider tells you that you are at risk for this type of infection. ? Your sexual activity has changed since you were last screened, and you are at increased risk for chlamydia or gonorrhea. Ask your health care provider if you are at risk.  Ask your health care provider about whether you are at high risk for HIV. Your health care provider may recommend a prescription medicine to help prevent HIV infection. If you choose to take medicine to prevent HIV, you should first get tested for HIV. You should then be tested every 3 months for as long as you are taking the medicine. Follow these instructions at home: Lifestyle  Do not use any products that contain nicotine or tobacco, such as cigarettes, e-cigarettes, and chewing tobacco. If you need help quitting, ask your health care provider.  Do not use street drugs.  Do not share needles.  Ask your health care provider for help if you need support or information about quitting drugs. Alcohol use  Do not drink alcohol if your health care provider tells you not to drink.  If you drink alcohol: ? Limit how much you have to 0-2 drinks a day. ? Be aware of how much alcohol is in your drink. In the [place] one drink equals one 12 oz bottle of beer (355 mL), one 5 oz glass of wine (148 mL), or one 1 oz glass of hard liquor (44 mL). General instructions  Schedule regular health, dental, and eye exams.  Stay current with your vaccines.  Tell your health care provider if: ? You often feel depressed. ? You have ever been abused or do not feel safe at home. Summary  Adopting a healthy lifestyle and getting preventive care are important in promoting health and wellness.  Follow your health care provider's instructions about healthy diet, exercising, and getting tested or screened for diseases.  Follow your health  care provider's instructions on monitoring your cholesterol and blood pressure. This information is not intended to replace advice given to you by your health care provider. Make sure you discuss any questions you have with your health care provider. Document Revised: 08/07/2018 Document Reviewed: 08/07/2018 Elsevier Patient Education  2020 [employer].

## 2020-04-15 NOTE — Progress Notes (Signed)
Subjective:    Patient ID: Austin [name], male    DOB: [birth year], 74 y.o.   MRN: [id number]  HPI He is here for a physical exam.   He had left knee arthroscopy surgery in June.  The knee is better.  No other changes in his health.    He has no concerns.   Medications and allergies reviewed with patient and updated if appropriate.  Patient Active Problem List   Diagnosis Date Noted  . Hiccups 12/18/2019  . Rib pain on right side 09/22/2019  . Syncope   . Prediabetes 08/28/2016  . CAD (coronary artery disease) 12/13/2010  . Hyperlipidemia 11/12/2010  . Allergic rhinitis 11/12/2010  . 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 Hematuria 06/22/2009  . Essential hypertension 11/26/2008  . NEPHROLITHIASIS, HX OF 06/26/2007  . History of follicular lymphoma 79/09/4095    Current Outpatient Medications on File Prior to Visit  Medication Sig Dispense Refill  . amLODipine (NORVASC) 10 MG tablet TAKE 1 TABLET EVERY DAY 90 tablet 2  . aspirin 81 MG tablet Take 1 tablet (81 mg total) by mouth daily. 30 tablet   . clopidogrel (PLAVIX) 75 MG tablet TAKE 1 TABLET EVERY DAY 90 tablet 2  . dorzolamide-timolol (COSOPT) 22.3-6.8 MG/ML ophthalmic solution Place 1 drop into both eyes 2 (two) times daily.     . finasteride (PROSCAR) 5 MG tablet TAKE 1 TABLET EVERY DAY 90 tablet 2  . latanoprost (XALATAN) 0.005 % ophthalmic solution Place 1 drop into both eyes at bedtime.    . nitroGLYCERIN (NITROSTAT) 0.4 MG SL tablet Place 1 tablet (0.4 mg total) under the tongue every 5 (five) minutes as needed for chest pain. 25 tablet 11   No current facility-administered medications on file prior to visit.    Past Medical History:  Diagnosis Date  . Allergy   . Arthritis   . CAP (community acquired pneumonia)   . Cataract    bilateral  . Coronary artery disease    03/13/2016 DES to first OM, EF 55% // Myoview 12/19: EF 55, ant-lat infarction, no significant ischemia, Low Risk   . Heart murmur    "outgrew it" (03/13/2016)  . Hematuria   . HTN (hypertension)   . Hx of cardiovascular stress test    ETT-Myoview (10/15): Nondiagnostic EKG changes, anterolateral and inferolateral scar, no ischemia, EF 48%, low risk  . Hyperlipemia   . Hyperlipidemia    NMR Lipoprofile 2008: LDL 168 ( 2410/ 1826), HDL 41, TG 71. LDL goal = <100, ideally <  70. Framingham Study LDL goal = < 130.  Marland Kitchen Kidney stones    "passed them"; Dr. Terance Hart (03/13/2016)  . Myocardial infarction ([place]) 2001  . Non Hodgkin's lymphoma ([place]  . Pleural effusion   . TIA (transient ischemic attack) 1980s   "I was exercising when I had it"    Past Surgical History:  Procedure Laterality Date  . CARDIAC CATHETERIZATION N/A 12/22/2015   Procedure: Left Heart Cath and Coronary Angiography;  Surgeon: Sherren Mocha, MD;  Location: [facility];  Service: Cardiovascular;  Laterality: N/A;  . CARDIAC CATHETERIZATION N/A 12/22/2015   Procedure: Coronary Stent Intervention;  Surgeon: Sherren Mocha, MD;  Location: [facility];  Service: Cardiovascular;  Laterality: N/A;  . CARDIAC CATHETERIZATION N/A 12/22/2015   Procedure: Intravascular Pressure Wire/FFR Study;  Surgeon: Sherren Mocha, MD;  Location: [facility];  Service: Cardiovascular;  Laterality: N/A;  . CARDIAC CATHETERIZATION N/A 03/13/2016   Procedure: Left Heart Cath and Coronary Angiography;  Surgeon: Sherren Mocha, MD;  Location: [facility];  Service: Cardiovascular;  Laterality: N/A;  . COLONOSCOPY  2002  . CORONARY ANGIOPLASTY WITH STENT PLACEMENT  ~ 2013   "1 stent"  . ESOPHAGOGASTRODUODENOSCOPY (EGD) WITH ESOPHAGEAL DILATION  2002  . KNEE ARTHROSCOPY Left 1990s    Social History   Socioeconomic History  . Marital status: Married    Spouse name: Not on file  . Number of children: 3  . Years of education: Not on file  . Highest education level: Not on file  Occupational History  .  Occupation: retired Higher education careers adviser  Tobacco Use  . Smoking status: Never Smoker  . Smokeless tobacco: Never Used  Vaping Use  . Vaping Use: Never used  Substance and Sexual Activity  . Alcohol use: No    Comment: drank some in my 20's"  . Drug use: No  . Sexual activity: Yes  Other Topics Concern  . Not on file  Social History Narrative  . Not on file   Social Determinants of Health   Financial Resource Strain: Low Risk   . Difficulty of Paying Living Expenses: Not hard at all  Food Insecurity:   . Worried About Charity fundraiser in the Last Year: Not on file  . Ran Out of Food in the Last Year: Not on file  Transportation Needs:   . Lack of Transportation (Medical): Not on file  . Lack of Transportation (Non-Medical): Not on file  Physical Activity:   . Days of Exercise per Week: Not on file  . Minutes of Exercise per Session: Not on file  Stress:   . Feeling of Stress : Not on file  Social Connections:   . Frequency of Communication with Friends and Family: Not on file  . Frequency of Social Gatherings with Friends and Family: Not on file  . Attends Religious Services: Not on file  . Active Member of Clubs or Organizations: Not on file  . Attends Archivist Meetings: Not on file  . Marital Status: Not on file    Family History  Problem Relation Age of Onset  . Cancer Mother        lung cancer  . Diabetes Mother   . Hyperlipidemia Mother   . Stroke Father        age 75  . Prostate cancer Father   . Cancer Sister        ?lymphoma    Review of Systems  Constitutional: Negative for chills and fever.  Eyes: Negative for visual disturbance.  Respiratory: Negative for cough, shortness of breath and wheezing.   Cardiovascular: Negative for chest pain, palpitations and leg swelling.  Gastrointestinal: Negative for abdominal pain, blood in stool, constipation, diarrhea and nausea.       No gerd  Genitourinary: Negative for difficulty urinating, dysuria and  hematuria.  Musculoskeletal: Positive for arthralgias and back pain (mild lower back).  Skin: Negative for color change and rash.  Neurological: Negative for light-headedness and headaches.  Psychiatric/Behavioral: Negative for dysphoric mood. The patient is not nervous/anxious.        Objective:   Vitals:   04/16/20 1033  BP: (!) 142/74  Pulse: 62  Temp: 98.2 F (36.8 C)  SpO2: 97%   Filed Weights   04/16/20 1033  Weight: 203 lb (92.1 kg)   Body mass index is 26.78 kg/m.  BP  Readings from Last 3 Encounters:  04/16/20 (!) 142/74  12/18/19 (!) 148/72  09/22/19 (!) 154/84    Wt Readings from Last 3 Encounters:  04/16/20 203 lb (92.1 kg)  12/18/19 205 lb (93 kg)  09/22/19 202 lb 12.8 oz (92 kg)     Physical Exam Constitutional: He appears well-developed and well-nourished. No distress.  HENT:  Head: Normocephalic and atraumatic.  Right Ear: External ear normal.  Left Ear: External ear normal.  Mouth/Throat: Oropharynx is clear and moist.  Normal ear canals and TM b/l  Eyes: Conjunctivae and EOM are normal.  Neck: Neck supple. No tracheal deviation present. No thyromegaly present.  No carotid bruit  Cardiovascular: Normal rate, regular rhythm, normal heart sounds and intact distal pulses.   No murmur heard. Pulmonary/Chest: Effort normal and breath sounds normal. No respiratory distress. He has no wheezes. He has no rales.  Abdominal: Soft. He exhibits no distension. There is no tenderness.  Genitourinary: deferred  Musculoskeletal: He exhibits no edema.  Lymphadenopathy:   He has no cervical adenopathy.  Skin: Skin is warm and dry. He is not diaphoretic.  Psychiatric: He has a normal mood and affect. His behavior is normal.         Assessment & Plan:   Physical exam: Screening blood work  ordered Immunizations  had Covid, discussed tdap and flu Colonoscopy   Up to date  Eye exams   Up to date  Exercise   regular Weight  Good for age.  Substance  abuse   none  See Problem List for Assessment and Plan of chronic medical problems.   This visit occurred during the SARS-CoV-2 public health emergency.  Safety protocols were in place, including screening questions prior to the visit, additional usage of staff PPE, and extensive cleaning of exam room while observing appropriate contact time as indicated for disinfecting solutions.

## 2020-04-16 ENCOUNTER — Encounter: Payer: Self-pay | Admitting: Internal Medicine

## 2020-04-16 ENCOUNTER — Ambulatory Visit (INDEPENDENT_AMBULATORY_CARE_PROVIDER_SITE_OTHER): Payer: Medicare HMO | Admitting: Internal Medicine

## 2020-04-16 ENCOUNTER — Other Ambulatory Visit: Payer: Self-pay

## 2020-04-16 VITALS — BP 142/74 | HR 62 | Temp 98.2°F | Ht 73.0 in | Wt 203.0 lb

## 2020-04-16 DIAGNOSIS — E782 Mixed hyperlipidemia: Secondary | ICD-10-CM

## 2020-04-16 DIAGNOSIS — I2510 Atherosclerotic heart disease of native coronary artery without angina pectoris: Secondary | ICD-10-CM | POA: Diagnosis not present

## 2020-04-16 DIAGNOSIS — I10 Essential (primary) hypertension: Secondary | ICD-10-CM | POA: Diagnosis not present

## 2020-04-16 DIAGNOSIS — R7303 Prediabetes: Secondary | ICD-10-CM | POA: Diagnosis not present

## 2020-04-16 DIAGNOSIS — Z0000 Encounter for general adult medical examination without abnormal findings: Secondary | ICD-10-CM | POA: Diagnosis not present

## 2020-04-16 NOTE — Assessment & Plan Note (Signed)
Chronic Does not tolerate statins, Zetia We will check lipid panel Continue regular exercise, healthy diet

## 2020-04-16 NOTE — Assessment & Plan Note (Signed)
Chronic BP well controlled Current regimen effective and well tolerated Continue current medications at current doses cmp  

## 2020-04-16 NOTE — Assessment & Plan Note (Signed)
Chronic Check a1c Low sugar / carb diet Stressed regular exercise  

## 2020-04-16 NOTE — Assessment & Plan Note (Signed)
Chronic No concerning symptoms Following with cardiology Continue current meds Cbc, lipids, cmp, tsh

## 2020-04-17 LAB — COMPREHENSIVE METABOLIC PANEL
AG Ratio: 1.6 (calc) (ref 1.0–2.5)
ALT: 17 U/L (ref 9–46)
AST: 22 U/L (ref 10–35)
Albumin: 4.5 g/dL (ref 3.6–5.1)
Alkaline phosphatase (APISO): 104 U/L (ref 35–144)
BUN/Creatinine Ratio: 15 (calc) (ref 6–22)
BUN: 18 mg/dL (ref 7–25)
CO2: 30 mmol/L (ref 20–32)
Calcium: 10.3 mg/dL (ref 8.6–10.3)
Chloride: 103 mmol/L (ref 98–110)
Creat: 1.22 mg/dL — ABNORMAL HIGH (ref 0.70–1.18)
Globulin: 2.8 g/dL (calc) (ref 1.9–3.7)
Glucose, Bld: 87 mg/dL (ref 65–99)
Potassium: 5.1 mmol/L (ref 3.5–5.3)
Sodium: 141 mmol/L (ref 135–146)
Total Bilirubin: 0.9 mg/dL (ref 0.2–1.2)
Total Protein: 7.3 g/dL (ref 6.1–8.1)

## 2020-04-17 LAB — CBC WITH DIFFERENTIAL/PLATELET
Absolute Monocytes: 620 cells/uL (ref 200–950)
Basophils Absolute: 31 cells/uL (ref 0–200)
Basophils Relative: 0.8 %
Eosinophils Absolute: 101 cells/uL (ref 15–500)
Eosinophils Relative: 2.6 %
HCT: 40.8 % (ref 38.5–50.0)
Hemoglobin: 13.2 g/dL (ref 13.2–17.1)
Lymphs Abs: 1061 cells/uL (ref 850–3900)
MCH: 28.9 pg (ref 27.0–33.0)
MCHC: 32.4 g/dL (ref 32.0–36.0)
MCV: 89.3 fL (ref 80.0–100.0)
MPV: 9.7 fL (ref 7.5–12.5)
Monocytes Relative: 15.9 %
Neutro Abs: 2087 cells/uL (ref 1500–7800)
Neutrophils Relative %: 53.5 %
Platelets: 249 10*3/uL (ref 140–400)
RBC: 4.57 10*6/uL (ref 4.20–5.80)
RDW: 12.6 % (ref 11.0–15.0)
Total Lymphocyte: 27.2 %
WBC: 3.9 10*3/uL (ref 3.8–10.8)

## 2020-04-17 LAB — TSH: TSH: 2.07 mIU/L (ref 0.40–4.50)

## 2020-04-17 LAB — LIPID PANEL
Cholesterol: 221 mg/dL — ABNORMAL HIGH (ref <200)
HDL: 49 mg/dL (ref >=40)
LDL Cholesterol (Calc): 150 mg/dL (calc) — ABNORMAL HIGH
Non-HDL Cholesterol (Calc): 172 mg/dL (calc) — ABNORMAL HIGH (ref <130)
Total CHOL/HDL Ratio: 4.5 (calc) (ref <5.0)
Triglycerides: 108 mg/dL (ref <150)

## 2020-04-17 LAB — HEMOGLOBIN A1C
Hgb A1c MFr Bld: 6 % of total Hgb — ABNORMAL HIGH (ref <5.7)
Mean Plasma Glucose: 126 (calc)
eAG (mmol/L): 7 (calc)

## 2020-04-22 ENCOUNTER — Encounter: Payer: Self-pay | Admitting: *Deleted

## 2020-04-22 DIAGNOSIS — Z006 Encounter for examination for normal comparison and control in clinical research program: Secondary | ICD-10-CM

## 2020-04-27 ENCOUNTER — Telehealth: Payer: Self-pay | Admitting: Internal Medicine

## 2020-04-27 NOTE — Telephone Encounter (Signed)
Called pt back he states he was just concern. Once of his family member was just dx w/the virus. Pt denied sxs, and he states he feel good. He also had the pfizer vaccine already. Inform pt since he doesn't have any sxs, and he has taking the vaccine he shouldn't worry about getting tested, but of he like to get test he could contact [facility], or one of the local pharmacy. Updated covid vaccinations.Austin KitchenJohny Chess

## 2020-04-27 NOTE — Telephone Encounter (Signed)
New message:   Pt is calling and would like to know when he got his labs done would it show if he had Covid. Please advise.

## 2020-04-30 DIAGNOSIS — Z1159 Encounter for screening for other viral diseases: Secondary | ICD-10-CM | POA: Diagnosis not present

## 2020-04-30 DIAGNOSIS — Z20828 Contact with and (suspected) exposure to other viral communicable diseases: Secondary | ICD-10-CM | POA: Diagnosis not present

## 2020-04-30 MED ORDER — BD SWAB SINGLE USE REGULAR PADS: 1.0000 | MEDICATED_PAD | 3 refills | Status: AC | PRN

## 2020-04-30 NOTE — Addendum Note (Signed)
Addended by: Lauralee Evener C on: 04/30/2020 11:24 AM   Modules accepted: Orders

## 2020-05-02 ENCOUNTER — Other Ambulatory Visit: Payer: Self-pay | Admitting: Physician Assistant

## 2020-05-25 ENCOUNTER — Encounter: Payer: Medicare HMO | Admitting: *Deleted

## 2020-05-25 DIAGNOSIS — Z006 Encounter for examination for normal comparison and control in clinical research program: Secondary | ICD-10-CM

## 2020-05-25 NOTE — Research (Signed)
Pt to research clinic for [place] 4 visit 6. Pt doing well. No AE/SAE or medication changes to report. IP injection given in right ABD. Pt tolerated well. Next appoint ment scheduled for 11-09-2020.

## 2020-06-16 ENCOUNTER — Other Ambulatory Visit: Payer: Self-pay

## 2020-06-16 ENCOUNTER — Ambulatory Visit (INDEPENDENT_AMBULATORY_CARE_PROVIDER_SITE_OTHER): Payer: Medicare HMO | Admitting: *Deleted

## 2020-06-16 DIAGNOSIS — Z23 Encounter for immunization: Secondary | ICD-10-CM | POA: Diagnosis not present

## 2020-07-03 ENCOUNTER — Other Ambulatory Visit: Payer: Self-pay | Admitting: Cardiovascular Disease

## 2020-07-05 NOTE — Telephone Encounter (Signed)
Pt's pharmacy is requesting a refill on finasteride. Would Dr. Burt Knack like to refill with medication? Please address

## 2020-07-12 ENCOUNTER — Telehealth: Payer: Self-pay | Admitting: Cardiovascular Disease

## 2020-07-12 ENCOUNTER — Other Ambulatory Visit: Payer: Self-pay

## 2020-07-12 MED ORDER — FINASTERIDE 5 MG PO TABS: 5.0000 mg | ORAL_TABLET | Freq: Every day | ORAL | 0 refills | Status: DC

## 2020-07-12 NOTE — Telephone Encounter (Signed)
°*  STAT* If patient is at the pharmacy, call can be transferred to refill team.   1. Which medications need to be refilled? (please list name of each medication and dose if known) Finasteride 2. Which pharmacy/location (including street and city if local pharmacy) is medication to be sent to? [facility] fax [phone] or phone number [phone]  3. Do they need a 30 day or 90 day supply? [place]

## 2020-07-30 ENCOUNTER — Telehealth: Payer: Self-pay | Admitting: Pharmacist

## 2020-07-30 NOTE — Progress Notes (Signed)
    Chronic Care Management Pharmacy Assistant   Name: Austin [name]  MRN: [id number] DOB: [birth year]  Reason for Encounter: General Adherence Call   PCP : Binnie Rail, MD  Allergies:   Allergies  Allergen Reactions  . Zetia [Ezetimibe] Shortness Of Breath and Other (See Comments)    Chest pain  . Crestor [Rosuvastatin] Other (See Comments)    Myalgias  . Isosorbide Mononitrate [Isosorbide Nitrate] Other (See Comments)    Headache, throat soreness and hoarse  . Pravachol [Pravastatin Sodium] Other (See Comments)    Myalgias  . Silver Rash    Medications: Outpatient Encounter Medications as of 07/30/2020  Medication Sig  . Alcohol Swabs (B-D SINGLE USE SWABS REGULAR) PADS 1 each by Does not apply route as needed.  Marland Kitchen amLODipine (NORVASC) 10 MG tablet TAKE 1 TABLET EVERY DAY  . aspirin 81 MG tablet Take 1 tablet (81 mg total) by mouth daily.  . clopidogrel (PLAVIX) 75 MG tablet TAKE 1 TABLET EVERY DAY. Please make yearly appt with Dr. Burt Knack in December before anymore refills. Thank you 1st attempt  . dorzolamide-timolol (COSOPT) 22.3-6.8 MG/ML ophthalmic solution Place 1 drop into both eyes 2 (two) times daily.   . finasteride (PROSCAR) 5 MG tablet Take 1 tablet (5 mg total) by mouth daily.  Marland Kitchen latanoprost (XALATAN) 0.005 % ophthalmic solution Place 1 drop into both eyes at bedtime.  . nitroGLYCERIN (NITROSTAT) 0.4 MG SL tablet PLACE ONE TABLET UNDER TONGUE AS NEEDED FOR CHEST PAIN  . Study - ORION 4 - inclisiran 300 mg/1.70mL or placebo SQ injection (PI-Stuckey) Inject 300 mg into the skin every 6 (six) months.   No facility-administered encounter medications on file as of 07/30/2020.    Current Diagnosis: Patient Active Problem List   Diagnosis Date Noted  . Hiccups 12/18/2019  . Rib pain on right side 09/22/2019  . Syncope   . Prediabetes 08/28/2016  . CAD (coronary artery disease) 12/13/2010  . Hyperlipidemia 11/12/2010  . Allergic rhinitis 11/12/2010  .  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 Hematuria 06/22/2009  . Essential hypertension 11/26/2008  . NEPHROLITHIASIS, HX OF 06/26/2007  . History of follicular lymphoma 17/00/1749    Goals Addressed   None     Follow-Up:  Pharmacist Review  Called and spoke with the patient to ask if he has had any health issues since his last visit with the clinical pharmacist Mendel Ryder. The patient states that he is doing fine and not having any real issues that are of concern. The patient does stat that is is still doing the trial study of [place] and that his cholesterol is down. He states that he has about one more year of this trial study and he will be done. The patient states that he eats pretty healthy and that he gors to the gym at least twice a week to exercise. He states that his weight is maintained at about 201 lbs. The patient stated that he would like to make an appointment to see Dr. Quay Burow for a complete physical and to get a tetanus shot. He last stated that he feels some kind of way when he takes his medication, mild chest pain. He believes it may be to the fact that he takes all his medication at the same time. I told the patient that I would pass along the information to the clinical pharmacist Mendel Ryder.

## 2020-08-12 ENCOUNTER — Other Ambulatory Visit: Payer: Self-pay

## 2020-08-12 MED ORDER — AMLODIPINE BESYLATE 10 MG PO TABS: 10.0000 mg | ORAL_TABLET | Freq: Every day | ORAL | 1 refills | Status: DC

## 2020-08-16 ENCOUNTER — Ambulatory Visit: Payer: Medicare HMO | Admitting: Pharmacist

## 2020-08-16 ENCOUNTER — Other Ambulatory Visit: Payer: Self-pay

## 2020-08-16 DIAGNOSIS — I2510 Atherosclerotic heart disease of native coronary artery without angina pectoris: Secondary | ICD-10-CM

## 2020-08-16 DIAGNOSIS — E782 Mixed hyperlipidemia: Secondary | ICD-10-CM

## 2020-08-16 DIAGNOSIS — I10 Essential (primary) hypertension: Secondary | ICD-10-CM

## 2020-08-16 NOTE — Patient Instructions (Signed)
Visit Information  Phone number for Pharmacist: [phone]  Goals Addressed            This [place]   On track    [place] (see longitudinal plan of care for additional care plan information)  Current Barriers:   Chronic Disease Management support, education, and care coordination needs related to Hypertension, Hyperlipidemia, and Coronary Artery Disease   Hypertension BP Readings from Last 3 Encounters:  04/16/20 (!) 142/74  12/18/19 (!) 148/72  09/22/19 (!) 154/84   Pharmacist Clinical Goal(s): o Over the next 180 days, patient will work with PharmD and providers to maintain BP goal <140/90  Current regimen:  o Amlodipine 10 mg daily  Interventions: o Discussed BP goals and benefits of medication for prevention of heart attack / stroke  Patient self care activities - Over the next 180 days, patient will: o Check BP 3-5 times weekly, document, and provide at future appointments o Ensure daily salt intake < 2300 mg/day  Hyperlipidemia / CAD Lab Results  Component Value Date/Time   LDLCALC 150 (H) 04/16/2020 10:59 AM   LDLDIRECT 173.2 07/21/2013 08:09 AM   Pharmacist Clinical Goal(s): o Over the next 180 days, patient will work with PharmD and providers to maintain LDL goal < 70  Current regimen:  o clopidogrel 75 mg daily,  o aspirin 81 mg daily,  o nitroglycerin 0.4 mg as needed o [employer] study participant (PCSK9-inhibitor vs placebo)  Interventions: o Discussed cholesterol goals and benefits of medication for prevention of heart attack / stroke o Educated on cholesterol content in foods (particularly eggs)  Patient self care activities - Over the next 180 days, patient will: o Continue medications as prescribed o Reduce intake of high-cholesterol foods  Medication management  Pharmacist Clinical Goal(s): o Over the next 180 days, patient will work with PharmD and providers to maintain optimal medication  adherence  Current pharmacy: [facility] order  Interventions o Comprehensive medication review performed. o Continue current medication management strategy  Patient self care activities - Over the next 180 days, patient will: o Focus on medication adherence by fill date o Take medications as prescribed o Report any questions or concerns to PharmD and/or provider(s)  Please see past updates related to this goal by clicking on the "Past Updates" button in the selected goal       The patient verbalized understanding of instructions, educational materials, and care plan provided today and declined offer to receive copy of patient instructions, educational materials, and care plan.  Telephone follow up appointment with pharmacy team member scheduled for: 6 months  Charlene Brooke, PharmD, [facility] Clinical Pharmacist [facility] at [facility] [phone]

## 2020-08-16 NOTE — Chronic Care Management (AMB) (Signed)
Chronic Care Management Pharmacy  Name: Austin [name]  MRN: [id number] DOB: 05-11-1946  Chief Complaint/ HPI  Austin [name],  74 y.o. , male presents for their Follow-Up CCM visit with the clinical pharmacist via telephone.  PCP : Binnie Rail, MD  Patient Care Team: Binnie Rail, MD as PCP - General (Internal Medicine) Sherren Mocha, MD as PCP - Cardiology (Cardiology) Charlton Haws, [facility] as Pharmacist (Pharmacist)  Their chronic conditions include: Hypertension, Hyperlipidemia, Coronary Artery Disease, BPH and Prediabetes  Patient reports he would like to see PCP soon because he hasn't felt the same since getting his COVID booster.   Office Visits: 04/16/20 Dr Quay Burow OV: CPX. Had L knee surgery in June. No med changes.  12/18/19 Dr Quay Burow OV: f/u GERD, hiccups. No med changes.  09/22/19 Dr Quay Burow OV: acute visit for pain on R side. Rec'd Tylenol, NSAIDs prn, not regularly. Pt has known white coat HTN. No med changes.  Consult Visit: 12/15/19 Dr Berenice Primas (orthopedic surgery): f/u for L knee osteoarthritis.  11/25/19 RN Melissa Noon (research study): injection given.   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 Intolerant to Zetia and other lipid-lowering therapies. Followed through [facility] trial (new cholesterol drug).   Allergies  Allergen Reactions  . Zetia [Ezetimibe] Shortness Of Breath and Other (See Comments)    Chest pain  . Crestor [Rosuvastatin] Other (See Comments)    Myalgias  . Isosorbide Mononitrate [Isosorbide Nitrate] Other (See Comments)    Headache, throat soreness and hoarse  . Pravachol [Pravastatin Sodium] Other (See Comments)    Myalgias  . Silver Rash   Medications: Outpatient Encounter Medications as of 08/16/2020  Medication Sig  . Alcohol Swabs (B-D SINGLE USE SWABS REGULAR) PADS 1 each by Does not apply route as needed.  Marland Kitchen amLODipine (NORVASC) 10 MG tablet Take 1 tablet (10 mg total) by mouth  daily.  Marland Kitchen aspirin 81 MG tablet Take 1 tablet (81 mg total) by mouth daily.  . clopidogrel (PLAVIX) 75 MG tablet TAKE 1 TABLET EVERY DAY. Please make yearly appt with Dr. Burt Knack in December before anymore refills. Thank you 1st attempt  . dorzolamide-timolol (COSOPT) 22.3-6.8 MG/ML ophthalmic solution Place 1 drop into both eyes 2 (two) times daily.   . finasteride (PROSCAR) 5 MG tablet Take 1 tablet (5 mg total) by mouth daily.  Marland Kitchen latanoprost (XALATAN) 0.005 % ophthalmic solution Place 1 drop into both eyes at bedtime.  . nitroGLYCERIN (NITROSTAT) 0.4 MG SL tablet PLACE ONE TABLET UNDER TONGUE AS NEEDED FOR CHEST PAIN  . Study - ORION 4 - inclisiran 300 mg/1.46mL or placebo SQ injection (PI-Stuckey) Inject 300 mg into the skin every 6 (six) months.   No facility-administered encounter medications on file as of 08/16/2020.   Wt Readings from Last 3 Encounters:  04/16/20 203 lb (92.1 kg)  12/18/19 205 lb (93 kg)  09/22/19 202 lb 12.8 oz (92 kg)   Lab Results  Component Value Date   CREATININE 1.22 (H) 04/16/2020   BUN 18 04/16/2020   GFR 81.75 03/14/2018   GFRNONAA >60 04/17/2019   GFRAA >60 04/17/2019   NA 141 04/16/2020   K 5.1 04/16/2020   CALCIUM 10.3 04/16/2020   CO2 30 04/16/2020     Current Diagnosis/Assessment:   Goals Addressed            This Visit's Progress   . Pharmacy Care Plan   On track    CARE PLAN ENTRY (  see longitudinal plan of care for additional care plan information)  Current Barriers:  . Chronic Disease Management support, education, and care coordination needs related to Hypertension, Hyperlipidemia, and Coronary Artery Disease   Hypertension BP Readings from Last 3 Encounters:  04/16/20 (!) 142/74  12/18/19 (!) 148/72  09/22/19 (!) 154/84 .  Pharmacist Clinical Goal(s): o Over the next 180 days, patient will work with PharmD and providers to maintain BP goal <140/90 . Current regimen:  o Amlodipine 10 mg daily . Interventions: o Discussed  BP goals and benefits of medication for prevention of heart attack / stroke . Patient self care activities - Over the next 180 days, patient will: o Check BP 3-5 times weekly, document, and provide at future appointments o Ensure daily salt intake < 2300 mg/day  Hyperlipidemia / CAD Lab Results  Component Value Date/Time   LDLCALC 150 (H) 04/16/2020 10:59 AM   LDLDIRECT 173.2 07/21/2013 08:09 AM .  Pharmacist Clinical Goal(s): o Over the next 180 days, patient will work with PharmD and providers to maintain LDL goal < 70 . Current regimen:  o clopidogrel 75 mg daily,  o aspirin 81 mg daily,  o nitroglycerin 0.4 mg as needed o [employer] study participant (PCSK9-inhibitor vs placebo) . Interventions: o Discussed cholesterol goals and benefits of medication for prevention of heart attack / stroke o Educated on cholesterol content in foods (particularly eggs) . Patient self care activities - Over the next 180 days, patient will: o Continue medications as prescribed o Reduce intake of high-cholesterol foods  Medication management . Pharmacist Clinical Goal(s): o Over the next 180 days, patient will work with PharmD and providers to maintain optimal medication adherence . Current pharmacy: [facility] mail order . Interventions o Comprehensive medication review performed. o Continue current medication management strategy . Patient self care activities - Over the next 180 days, patient will: o Focus on medication adherence by fill date o Take medications as prescribed o Report any questions or concerns to PharmD and/or provider(s)  Please see past updates related to this goal by clicking on the "Past Updates" button in the selected goal        Hypertension   BP goal is:  <140/90  Office blood pressures are  BP Readings from Last 3 Encounters:  04/16/20 (!) 142/74  12/18/19 (!) 148/72  09/22/19 (!) 154/84   Patient checks BP at home 3-5x per week  Patient home BP readings  are ranging: 132/84   Patient has failed these meds in the past: doxazosin, metoprolol  Patient is currently controlled on the following medications:   amlodipine 10 mg daily  We discussed: pt endorses compliance and denies side effects  Plan  Continue current medications and control with diet and exercise    Hyperlipidemia/CAD   LDL goal < 70 CAD: MI in 2012 treated with PCI of the LCx with a BMS PCI with DES to the proximal LAD in 11/2015  Last lipids Lab Results  Component Value Date   CHOL 221 (H) 04/16/2020   HDL 49 04/16/2020   LDLCALC 150 (H) 04/16/2020   LDLDIRECT 173.2 07/21/2013   TRIG 108 04/16/2020   CHOLHDL 4.5 04/16/2020   Patient has failed these meds in past: rosuvastatin, pravastatin (myalgias), ezetimibe  Patient is currently uncontrolled on the following medications:   clopidogrel 75 mg daily,   aspirin 81 mg daily,   nitroglycerin 0.4 mg SL prn  We discussed:  Pt is still enrolled in [place] trial (Inclisiran, a new  PCSK9-inhibitor) and is supposed to be in it for at least a year. Planned study end is 2022.  Plan  Continue current medications and control with diet and exercise    Medication Management   Patient's preferred pharmacy is:  [id number] [place], [place] - [place] [address] [place] [address] Phone: [phone] Fax: [phone]  [facility] - [place], [place] [address] Phone: [phone] Fax: [phone]  Uses pill box? Yes Pt endorses 100% compliance  We discussed: Current pharmacy is preferred with insurance plan and patient is satisfied with pharmacy services   Scheduled PCP appt at patient's request.  Plan  Continue current medication management strategy    Follow up: 6 month phone visit  Charlene Brooke, PharmD, BCACP Clinical Pharmacist [place] at [facility] [phone]

## 2020-08-17 ENCOUNTER — Other Ambulatory Visit: Payer: Self-pay | Admitting: Cardiovascular Disease

## 2020-08-27 DIAGNOSIS — Z0100 Encounter for examination of eyes and vision without abnormal findings: Secondary | ICD-10-CM | POA: Diagnosis not present

## 2020-08-29 NOTE — Progress Notes (Signed)
Subjective:    Patient ID: Austin [name], male    DOB: 03/06/1946, 75 y.o.   MRN: [id number]  HPI The patient is here for an acute visit.   Decreased energy - it started after his third covid vaccine.  This was about a month ago.  He is seeing some slow improvement.  He is still exercising regularly - at least 2/week, but not for as long. His stamina is less.  He is sleeping well.  He denies unintended weight loss, fevers, sweats.    BP at home 130/85.  He uses a wrist cuff.   He sees cardio tomorrow.    Medications and allergies reviewed with patient and updated if appropriate.  Patient Active Problem List   Diagnosis Date Noted  . Hiccups 12/18/2019  . Rib pain on right side 09/22/2019  . Syncope   . Prediabetes 08/28/2016  . CAD (coronary artery disease) 12/13/2010  . Hyperlipidemia 11/12/2010  . Allergic rhinitis 11/12/2010  . 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 Hematuria 06/22/2009  . Essential hypertension 11/26/2008  . NEPHROLITHIASIS, HX OF 06/26/2007  . History of follicular lymphoma [id number]    Current Outpatient Medications on File Prior to Visit  Medication Sig Dispense Refill  . Alcohol Swabs (B-D SINGLE USE SWABS REGULAR) PADS 1 each by Does not apply route as needed. 3 each 3  . amLODipine (NORVASC) 10 MG tablet Take 1 tablet (10 mg total) by mouth daily. Please keep upcoming appointment in Jan 2022 for future refills. Thank you 90 tablet 0  . aspirin 81 MG tablet Take 1 tablet (81 mg total) by mouth daily. 30 tablet   . clopidogrel (PLAVIX) 75 MG tablet TAKE 1 TABLET EVERY DAY. Please make yearly appt with Dr. Burt Knack in December before anymore refills. Thank you 1st attempt 90 tablet 0  . dorzolamide-timolol (COSOPT) 22.3-6.8 MG/ML ophthalmic solution Place 1 drop into both eyes 2 (two) times daily.     . finasteride (PROSCAR) 5 MG tablet Take 1 tablet (5 mg total) by mouth daily. 90 tablet 0  . latanoprost (XALATAN) 0.005 %  ophthalmic solution Place 1 drop into both eyes at bedtime.    . nitroGLYCERIN (NITROSTAT) 0.4 MG SL tablet PLACE ONE TABLET UNDER TONGUE AS NEEDED FOR CHEST PAIN 25 tablet 11  . Study - ORION 4 - inclisiran 300 mg/1.31mL or placebo SQ injection (PI-Stuckey) Inject 300 mg into the skin every 6 (six) months.     No current facility-administered medications on file prior to visit.    Past Medical History:  Diagnosis Date  . Allergy   . Arthritis   . CAP (community acquired pneumonia)   . Cataract    bilateral  . Coronary artery disease    03/13/2016 DES to first OM, EF 55% // Myoview 12/19: EF 55, ant-lat infarction, no significant ischemia, Low Risk  . Heart murmur    "outgrew it" (03/13/2016)  . Hematuria   . HTN (hypertension)   . Hx of cardiovascular stress test    ETT-Myoview (10/15): Nondiagnostic EKG changes, anterolateral and inferolateral scar, no ischemia, EF 48%, low risk  . Hyperlipemia   . Hyperlipidemia    NMR Lipoprofile 2008: LDL 168 ( 2410/ 1826), HDL 41, TG 71. LDL goal = <100, ideally <  70. [place] Study LDL goal = < 130.  Marland Kitchen Kidney stones    "passed them"; Dr. Terance Hart (03/13/2016)  . Myocardial infarction ([place]) 2001  . Non Hodgkin's lymphoma (  [place])    [facility]  . Pleural effusion   . TIA (transient ischemic attack) 1980s   "I was exercising when I had it"    Past Surgical History:  Procedure Laterality Date  . CARDIAC CATHETERIZATION N/A 12/22/2015   Procedure: Left Heart Cath and Coronary Angiography;  Surgeon: Sherren Mocha, MD;  Location: [place] [facility];  Service: Cardiovascular;  Laterality: N/A;  . CARDIAC CATHETERIZATION N/A 12/22/2015   Procedure: Coronary Stent Intervention;  Surgeon: Sherren Mocha, MD;  Location: [place] [facility];  Service: Cardiovascular;  Laterality: N/A;  . CARDIAC CATHETERIZATION N/A 12/22/2015   Procedure: Intravascular Pressure Wire/FFR Study;  Surgeon: Sherren Mocha, MD;  Location: [place] [facility];   Service: Cardiovascular;  Laterality: N/A;  . CARDIAC CATHETERIZATION N/A 03/13/2016   Procedure: Left Heart Cath and Coronary Angiography;  Surgeon: Sherren Mocha, MD;  Location: [place] [facility];  Service: Cardiovascular;  Laterality: N/A;  . COLONOSCOPY  2002  . CORONARY ANGIOPLASTY WITH STENT PLACEMENT  ~ 2013   "1 stent"  . ESOPHAGOGASTRODUODENOSCOPY (EGD) WITH ESOPHAGEAL DILATION  2002  . KNEE ARTHROSCOPY Left 1990s    Social History   Socioeconomic History  . Marital status: Married    Spouse name: Not on file  . Number of children: 3  . Years of education: Not on file  . Highest education level: Not on file  Occupational History  . Occupation: retired Higher education careers adviser  Tobacco Use  . Smoking status: Never Smoker  . Smokeless tobacco: Never Used  Vaping Use  . Vaping Use: Never used  Substance and Sexual Activity  . Alcohol use: No    Comment: drank some in my 20's"  . Drug use: No  . Sexual activity: Yes  Other Topics Concern  . Not on file  Social History Narrative  . Not on file   Social Determinants of Health   Financial Resource Strain: Low Risk   . Difficulty of Paying Living Expenses: Not hard at all  Food Insecurity: Not on file  Transportation Needs: Not on file  Physical Activity: Not on file  Stress: Not on file  Social Connections: Not on file    Family History  Problem Relation Age of Onset  . Cancer Mother        lung cancer  . Diabetes Mother   . Hyperlipidemia Mother   . Stroke Father        age 82  . Prostate cancer Father   . Cancer Sister        ?lymphoma    Review of Systems  Constitutional: Positive for fatigue. Negative for unexpected weight change.       Decreased stamina - slowly improving  Respiratory: Negative for cough, shortness of breath and wheezing.   Cardiovascular: Negative for chest pain, palpitations and leg swelling.  Gastrointestinal: Negative for abdominal pain, constipation, diarrhea and nausea.   Musculoskeletal: Positive for arthralgias and myalgias.       Increased joint/muscle pain - improving  Neurological: Negative for light-headedness, numbness and headaches.  Psychiatric/Behavioral: Negative for sleep disturbance.       Objective:   Vitals:   08/31/20 0743  BP: (!) 150/80  Pulse: 71  Temp: 98 F (36.7 C)  SpO2: 97%   BP Readings from Last 3 Encounters:  08/31/20 (!) 150/80  04/16/20 (!) 142/74  12/18/19 (!) 148/72   Wt Readings from Last 3 Encounters:  08/31/20 202 lb 3.2 oz (91.7 kg)  04/16/20 203 lb (92.1  kg)  12/18/19 205 lb (93 kg)   Body mass index is 26.68 kg/m.   Physical Exam    Constitutional: Appears well-developed and well-nourished. No distress.  Head: Normocephalic and atraumatic.  Neck: Neck supple. No tracheal deviation present. No thyromegaly present.  No cervical lymphadenopathy Cardiovascular: Normal rate, regular rhythm and normal heart sounds.  No murmur heard. No carotid bruit .  No edema Pulmonary/Chest: Effort normal and breath sounds normal. No respiratory distress. No has no wheezes. No rales.  Skin: Skin is warm and dry. Not diaphoretic.  Psychiatric: Normal mood and affect. Behavior is normal.       Assessment & Plan:    See Problem List for Assessment and Plan of chronic medical problems.    This visit occurred during the SARS-CoV-2 public health emergency.  Safety protocols were in place, including screening questions prior to the visit, additional usage of staff PPE, and extensive cleaning of exam room while observing appropriate contact time as indicated for disinfecting solutions.

## 2020-08-31 ENCOUNTER — Encounter: Payer: Self-pay | Admitting: Internal Medicine

## 2020-08-31 ENCOUNTER — Other Ambulatory Visit: Payer: Self-pay

## 2020-08-31 ENCOUNTER — Ambulatory Visit (INDEPENDENT_AMBULATORY_CARE_PROVIDER_SITE_OTHER): Payer: Medicare HMO | Admitting: Internal Medicine

## 2020-08-31 VITALS — BP 150/80 | HR 71 | Temp 98.0°F | Ht 73.0 in | Wt 202.2 lb

## 2020-08-31 DIAGNOSIS — E782 Mixed hyperlipidemia: Secondary | ICD-10-CM | POA: Diagnosis not present

## 2020-08-31 DIAGNOSIS — R5383 Other fatigue: Secondary | ICD-10-CM | POA: Insufficient documentation

## 2020-08-31 DIAGNOSIS — Z8572 Personal history of non-Hodgkin lymphomas: Secondary | ICD-10-CM | POA: Diagnosis not present

## 2020-08-31 DIAGNOSIS — I10 Essential (primary) hypertension: Secondary | ICD-10-CM

## 2020-08-31 DIAGNOSIS — R7303 Prediabetes: Secondary | ICD-10-CM | POA: Diagnosis not present

## 2020-08-31 LAB — CBC WITH DIFFERENTIAL/PLATELET
Basophils Absolute: 0 10*3/uL (ref 0.0–0.1)
Basophils Relative: 1 % (ref 0.0–3.0)
Eosinophils Absolute: 0.1 10*3/uL (ref 0.0–0.7)
Eosinophils Relative: 2.3 % (ref 0.0–5.0)
HCT: 40.9 % (ref 39.0–52.0)
Hemoglobin: 14.1 g/dL (ref 13.0–17.0)
Lymphocytes Relative: 23.2 % (ref 12.0–46.0)
Lymphs Abs: 0.9 10*3/uL (ref 0.7–4.0)
MCHC: 34.5 g/dL (ref 30.0–36.0)
MCV: 86.9 fl (ref 78.0–100.0)
Monocytes Absolute: 0.5 10*3/uL (ref 0.1–1.0)
Monocytes Relative: 12.8 % — ABNORMAL HIGH (ref 3.0–12.0)
Neutro Abs: 2.3 10*3/uL (ref 1.4–7.7)
Neutrophils Relative %: 60.7 % (ref 43.0–77.0)
Platelets: 256 10*3/uL (ref 150.0–400.0)
RBC: 4.7 Mil/uL (ref 4.22–5.81)
RDW: 13.8 % (ref 11.5–15.5)
WBC: 3.8 10*3/uL — ABNORMAL LOW (ref 4.0–10.5)

## 2020-08-31 LAB — COMPREHENSIVE METABOLIC PANEL
ALT: 15 U/L (ref 0–53)
AST: 19 U/L (ref 0–37)
Albumin: 4.6 g/dL (ref 3.5–5.2)
Alkaline Phosphatase: 106 U/L (ref 39–117)
BUN: 16 mg/dL (ref 6–23)
CO2: 30 mEq/L (ref 19–32)
Calcium: 10.4 mg/dL (ref 8.4–10.5)
Chloride: 100 mEq/L (ref 96–112)
Creatinine, Ser: 1.11 mg/dL (ref 0.40–1.50)
GFR: 64.86 mL/min (ref >60.00)
Glucose, Bld: 95 mg/dL (ref 70–99)
Potassium: 4 mEq/L (ref 3.5–5.1)
Sodium: 138 mEq/L (ref 135–145)
Total Bilirubin: 0.7 mg/dL (ref 0.2–1.2)
Total Protein: 7.8 g/dL (ref 6.0–8.3)

## 2020-08-31 LAB — TSH: TSH: 4.4 u[IU]/mL (ref 0.35–4.50)

## 2020-08-31 LAB — HEMOGLOBIN A1C: Hgb A1c MFr Bld: 5.9 % (ref 4.6–6.5)

## 2020-08-31 NOTE — Assessment & Plan Note (Signed)
Chronic Intolerant to statins, zetia In study - orion

## 2020-08-31 NOTE — Progress Notes (Signed)
Cardiology Office Note:    Date:  09/01/2020   ID:  Austin [name], DOB October 22, 1945, MRN [id number]  PCP:  Pincus Sanes, MD  [facility] Cardiologist:  Tonny Bollman, MD   [facility] Electrophysiologist:  None   Referring MD: Pincus Sanes, MD   Chief Complaint:  Follow-up (CAD)    Patient Profile:    Austin [name] is a 75 y.o. male with:   Coronary artery disease  ? S/p Lat MI in 2012 tx with BMS to LCx  ? PCI 4/17: DES to prox LAD ? PCI 7/17: DES to OM1 ? Residual dz: severe small vessel dz in dist PDA, Dx branches of LAD, apical LAD >> med Rx ? Myoview 12/19: ant-lat infarct, no ischemia  Follicular lymphoma   Hypertension  Hyperlipidemia  ? ORION 4 Trial participant   Hx of TIA  Chest pain   Prior CV studies: Myoview 08/12/18 1. EF 55%, normal wall motion.  2. Primarily fixed medium-sized, moderate intensity basal to mid anterolateral perfusion defect with some reversibility at the base. This appears to be primarily anterolateral infarction though there surprisingly does not appear to be a significant wall motion abnormality.  3. Borderline significant near 1 mm ST depression V4 and V5 at peak exercise, resolving quickly in recovery.  Overall, I think this is a low risk study. Most consistent with anterolateral infarction.  Carotid US 11/12/16 Summary: Findings suggest 1-39% internal carotid artery stenosis bilaterally. Vertebral arteries are patent with antegrade flow.  Echo 1/18 EF 55-60, grade 1 diastolic dysfunction, trivial AI, dilated aortic root (42), normal RVSF, trivial pericardial effusion  CARDIAC CATHETERIZATION 03/13/16 LAD proximal stent patent, distal 90 (unchanged from previous study), lateral D1 90-unchanged from previous study; D2 90-unchanged in previous study LCx mid 100-CTO; OM1 95 (denovo),stent patent with25ISR RPDA ostial 50, 80 Normal EF55 PCI: 2.75 x 18 mm Xience Alpine DES to OM1    [facility]  12/22/15 LAD prox 90%, dist 90%; D1 95% LCx - OM1 60% (The resting FFR is 0.98 and the FFR at peak hyperemia is 0.87), stent patent RCA prox 25%, dist 60%, RPDA 95% PCI 4 x 15 Resolute DES to pLAD 1. Severe proximal LAD stenosis, treated successfully with PCI using a drug-eluting stent 2. Continued patency of the stented segment in the obtuse marginal branch with moderate stenosis of that branch before the stent, negative pressure wire analysis 3. Patent RCA with mild diffuse disease 4. Significant small vessel coronary artery disease with severe stenoses of the PDA branch, diagonal subbranch, and apical LAD, all appropriate for medical therapy 5. Normal LV function by echo Recommend: Aggressive medical therapy for her residual CAD, continue dual antiplatelet therapy with aspirin and Plavix.  ETT-Echo 12/08/15 Impressions: Abnormal study after maximal exercise with reproduction of symptoms. Study suggests myocardial ischemia in the inferolateral and anteroseptal walls.  Myoview 10/15 Overall Impression:The study is abnormal. This is a low risk scan. There is mild reduction in the ejection fraction. There is a medium-sized scar of moderate severity affecting the anterolateral wall and the mid-inferolateral wall. There is no definite ischemia.LV Ejection Fraction: 48%.LV Wall Motion:Mild hypokinesis of the lateral wall.  Echo 5/08 EF 55-65%, normal wall motion, mild aortic root dilatation, mild LAE  History of Present Illness:    Austin [name] was last seen in clinic by Dr. Excell Seltzer in 07/2019.  He returns for follow-up.  He is here alone.  He felt poorly after his 3rd dose of mRNA COVID-19 vaccine.  He  has been slow to bounce back from this.  He does have occasional chest tightness with certain types of exercise.  This is a chronic symptom and has been stable.  He has not had significant shortness of breath, orthopnea, leg edema or syncope.    He exercises routinely.  He rides a  stationary bike and walks on a treadmill.  Past Medical History:  Diagnosis Date  . Allergy   . Arthritis   . CAP (community acquired pneumonia)   . Cataract    bilateral  . Coronary artery disease    03/13/2016 DES to first OM, EF 55% // Myoview 12/19: EF 55, ant-lat infarction, no significant ischemia, Low Risk  . Heart murmur    "outgrew it" (03/13/2016)  . Hematuria   . HTN (hypertension)   . Hx of cardiovascular stress test    ETT-Myoview (10/15): Nondiagnostic EKG changes, anterolateral and inferolateral scar, no ischemia, EF 48%, low risk  . Hyperlipemia   . Hyperlipidemia    NMR Lipoprofile 2008: LDL 168 ( 2410/ 1826), HDL 41, TG 71. LDL goal = <100, ideally <  70. Framingham Study LDL goal = < 130.  Marland Kitchen. Kidney stones    "passed them"; Dr. Vonita MossPeterson (03/13/2016)  . Myocardial infarction (HCC) 2001  . Non Hodgkin's lymphoma (HCC)    [facility]  . Pleural effusion   . TIA (transient ischemic attack) 1980s   "I was exercising when I had it"    Current Medications: Current Meds  Medication Sig  . Alcohol Swabs (B-D SINGLE USE SWABS REGULAR) PADS 1 each by Does not apply route as needed.  Marland Kitchen. amLODipine (NORVASC) 10 MG tablet Take 1 tablet (10 mg total) by mouth daily. Please keep upcoming appointment in Jan 2022 for future refills. Thank you  . aspirin 81 MG tablet Take 1 tablet (81 mg total) by mouth daily.  . clopidogrel (PLAVIX) 75 MG tablet TAKE 1 TABLET EVERY DAY. Please make yearly appt with Dr. Excell Seltzerooper in December before anymore refills. Thank you 1st attempt  . dorzolamide-timolol (COSOPT) 22.3-6.8 MG/ML ophthalmic solution Place 1 drop into both eyes 2 (two) times daily.   . finasteride (PROSCAR) 5 MG tablet Take 1 tablet (5 mg total) by mouth daily.  Marland Kitchen. latanoprost (XALATAN) 0.005 % ophthalmic solution Place 1 drop into both eyes at bedtime.  . nitroGLYCERIN (NITROSTAT) 0.4 MG SL tablet PLACE ONE TABLET UNDER TONGUE AS NEEDED FOR CHEST PAIN  . Study - ORION 4 -  inclisiran 300 mg/1.405mL or placebo SQ injection (PI-Stuckey) Inject 300 mg into the skin every 6 (six) months.     Allergies:   Zetia [ezetimibe], Crestor [rosuvastatin], Isosorbide mononitrate [isosorbide nitrate], Pravachol [pravastatin sodium], and Silver   Social History   Tobacco Use  . Smoking status: Never Smoker  . Smokeless tobacco: Never Used  Vaping Use  . Vaping Use: Never used  Substance Use Topics  . Alcohol use: No    Comment: drank some in my 20's"  . Drug use: No     Family Hx: The patient's family history includes Cancer in his mother and sister; Diabetes in his mother; Hyperlipidemia in his mother; Prostate cancer in his father; Stroke in his father.  ROS   EKGs/Labs/Other Test Reviewed:    EKG:  EKG is   ordered today.  The ekg ordered today demonstrates normal sinus rhythm, heart rate 65, normal axis, inferolateral Q waves, lateral T wave inversions, QTC 399, similar to prior tracings  Recent Labs: 08/31/2020:  ALT 15; BUN 16; Creatinine, Ser 1.11; Hemoglobin 14.1; Platelets 256.0; Potassium 4.0; Sodium 138; TSH 4.40   Recent Lipid Panel Lab Results  Component Value Date/Time   CHOL 221 (H) 04/16/2020 10:59 AM   TRIG 108 04/16/2020 10:59 AM   HDL 49 04/16/2020 10:59 AM   CHOLHDL 4.5 04/16/2020 10:59 AM   LDLCALC 150 (H) 04/16/2020 10:59 AM   LDLDIRECT 173.2 07/21/2013 08:09 AM      Risk Assessment/Calculations:      Physical Exam:    VS:  BP (!) 142/82   Pulse 65   Ht 6\' 1"  (1.854 m)   Wt 204 lb 9.6 oz (92.8 kg)   SpO2 98%   BMI 26.99 kg/m     Wt Readings from Last 3 Encounters:  09/01/20 204 lb 9.6 oz (92.8 kg)  08/31/20 202 lb 3.2 oz (91.7 kg)  04/16/20 203 lb (92.1 kg)     Constitutional:      Appearance: Healthy appearance. Not in distress.  Neck:     Vascular: No JVR. JVD normal.  Pulmonary:     Effort: Pulmonary effort is normal.     Breath sounds: No wheezing. No rales.  Cardiovascular:     Normal rate. Regular rhythm.  Normal S1. Normal S2.     Murmurs: There is no murmur.  Edema:    Peripheral edema absent.  Abdominal:     Palpations: Abdomen is soft. There is no hepatomegaly.  Skin:    General: Skin is warm and dry.  Neurological:     General: No focal deficit present.     Mental Status: Alert and oriented to person, place and time.     Cranial Nerves: Cranial nerves are intact.       ASSESSMENT & PLAN:    1. Coronary artery disease involving native coronary artery of native heart with angina pectoris History of prior myocardial infarction and multiple PCI procedures.  Last PCI in July 2017 was with a DES to the OM1.  He has residual small vessel disease and distal vessel disease which is treated medically.  He is overall stable with rare, mild angina.  Continue amlodipine, ASA, clopidogrel.  F/u 6 mos.   2. Essential hypertension BP above target.  However, he took his BP this AM before taking his meds.  It has come down since then.  I have asked him to track his BP at home for the next 2 weeks and send me readings via MyChart.  I suggested he check his BP several hours after his medication and the appropriate way to monitor his BP (rest 15 min, feet on the floor, no exercise or caffeine prior to, etc).    3. Mixed hyperlipidemia He is intol of med Rx.  He is remains in the Amo 4 trial.     Dispo:  Return in about 6 months (around 03/01/2021) for Routine Follow Up, w/ Dr. Burt Knack, in person.   Medication Adjustments/Labs and Tests Ordered: Current medicines are reviewed at length with the patient today.  Concerns regarding medicines are outlined above.  Tests Ordered: Orders Placed This Encounter  Procedures  . EKG 12-Lead   Medication Changes: No orders of the defined types were placed in this encounter.   Signed, Richardson Dopp, PA-C  09/01/2020 9:52 AM    [facility] [address], [place], [place]  [address] Phone: [phone]; Fax: [phone]

## 2020-08-31 NOTE — Assessment & Plan Note (Signed)
Chronic BP well controlled Continue current medications cmp  

## 2020-08-31 NOTE — Patient Instructions (Addendum)
    Blood work was ordered.      Medications changes include :   none     Please followup in 6 months  

## 2020-08-31 NOTE — Assessment & Plan Note (Signed)
chronic Sees oncology annually  No evidence of recurrence

## 2020-08-31 NOTE — Assessment & Plan Note (Signed)
Chronic Check a1c Low sugar / carb diet Stressed regular exercise  

## 2020-08-31 NOTE — Assessment & Plan Note (Signed)
Acute States dec stamina and low energy Started after third covid shot -- slowly improving No other concerning symptoms Check cbc, cmp, tsh, a1c given prediabetes

## 2020-09-01 ENCOUNTER — Encounter: Payer: Self-pay | Admitting: Physician Assistant

## 2020-09-01 ENCOUNTER — Ambulatory Visit: Payer: Medicare HMO | Admitting: Physician Assistant

## 2020-09-01 ENCOUNTER — Telehealth: Payer: Self-pay | Admitting: Internal Medicine

## 2020-09-01 ENCOUNTER — Other Ambulatory Visit: Payer: Self-pay

## 2020-09-01 VITALS — BP 142/82 | HR 65 | Ht 73.0 in | Wt 204.6 lb

## 2020-09-01 DIAGNOSIS — I25118 Atherosclerotic heart disease of native coronary artery with other forms of angina pectoris: Secondary | ICD-10-CM

## 2020-09-01 DIAGNOSIS — I10 Essential (primary) hypertension: Secondary | ICD-10-CM

## 2020-09-01 DIAGNOSIS — E782 Mixed hyperlipidemia: Secondary | ICD-10-CM | POA: Diagnosis not present

## 2020-09-01 DIAGNOSIS — I2510 Atherosclerotic heart disease of native coronary artery without angina pectoris: Secondary | ICD-10-CM

## 2020-09-01 NOTE — Patient Instructions (Signed)
Medication Instructions:  Your physician recommends that you continue on your current medications as directed. Please refer to the Current Medication list given to you today.  *If you need a refill on your cardiac medications before your next appointment, please call your pharmacy*  Lab Work: None ordered today  Testing/Procedures: None ordered today  Follow-Up: At [facility], you and your health needs are our priority.  As part of our continuing mission to provide you with exceptional heart care, we have created designated Provider Care Teams.  These Care Teams include your primary Cardiologist (physician) and Advanced Practice Providers (APPs -  Physician Assistants and Nurse Practitioners) who all work together to provide you with the care you need, when you need it.  Your next appointment:   6 month(s)  The format for your next appointment:   In Person  Provider:   Tonny Bollman, MD  Other Instructions Check blood pressure at home in the evening for 2 weeks. Send readings through my chart.

## 2020-09-01 NOTE — Telephone Encounter (Signed)
LVM for pt to rtn my call to schedule AWV with [facility]. Please schedule this appt if pt calls the office.  °

## 2020-09-17 ENCOUNTER — Ambulatory Visit (INDEPENDENT_AMBULATORY_CARE_PROVIDER_SITE_OTHER): Payer: Medicare HMO

## 2020-09-17 DIAGNOSIS — Z0000 Encounter for general adult medical examination without abnormal findings: Secondary | ICD-10-CM

## 2020-09-17 NOTE — Progress Notes (Signed)
I connected with Austin [name] today by telephone and verified that I am speaking with the correct person using two identifiers. Location patient: home Location provider: work Persons participating in the virtual visit: Austin [name] and Austin Abu, LPN.   I discussed the limitations, risks, security and privacy concerns of performing an evaluation and management service by telephone and the availability of in person appointments. I also discussed with the patient that there may be a patient responsible charge related to this service. The patient expressed understanding and verbally consented to this telephonic visit.    Interactive audio and video telecommunications were attempted between this provider and patient, however failed, due to patient having technical difficulties OR patient did not have access to video capability.  We continued and completed visit with audio only.  Some vital signs may be absent or patient reported.   Time Spent with patient on telephone encounter: 30 minutes  Subjective:   Austin [name] is a 75 y.o. male who presents for Medicare Annual/Subsequent preventive examination.  Review of Systems    No ROS. Medicare Wellness Visit. Additional risk factors are reflected in social history. Cardiac Risk Factors include: advanced age (>64men, >26 women);dyslipidemia;family history of premature cardiovascular disease;hypertension;male gender     Objective:    There were no vitals filed for this visit. There is no height or weight on file to calculate BMI.  Advanced Directives 09/17/2020 04/17/2019 01/10/2019 12/05/2017 09/04/2017 11/11/2016 09/05/2016  Does Patient Have a Medical Advance Directive? Yes No No No No No No  Type of Advance Directive Living will;Healthcare Power of Attorney - - - - - -  Does patient want to make changes to medical advance directive? No - Patient declined - - - - - -  Copy of [place] in Chart? No - copy  requested - - - - - -  Would patient like information on creating a medical advance directive? - No - Patient declined - No - Patient declined - - No - Patient declined    Current Medications (verified) Outpatient Encounter Medications as of 09/17/2020  Medication Sig  . Alcohol Swabs (B-D SINGLE USE SWABS REGULAR) PADS 1 each by Does not apply route as needed.  Marland Kitchen amLODipine (NORVASC) 10 MG tablet Take 1 tablet (10 mg total) by mouth daily. Please keep upcoming appointment in Jan 2022 for future refills. Thank you  . aspirin 81 MG tablet Take 1 tablet (81 mg total) by mouth daily.  . clopidogrel (PLAVIX) 75 MG tablet TAKE 1 TABLET EVERY DAY. Please make yearly appt with Dr. Burt Knack in December before anymore refills. Thank you 1st attempt  . dorzolamide-timolol (COSOPT) 22.3-6.8 MG/ML ophthalmic solution Place 1 drop into both eyes 2 (two) times daily.   . finasteride (PROSCAR) 5 MG tablet Take 1 tablet (5 mg total) by mouth daily.  Marland Kitchen latanoprost (XALATAN) 0.005 % ophthalmic solution Place 1 drop into both eyes at bedtime.  . nitroGLYCERIN (NITROSTAT) 0.4 MG SL tablet PLACE ONE TABLET UNDER TONGUE AS NEEDED FOR CHEST PAIN  . Study - ORION 4 - inclisiran 300 mg/1.20mL or placebo SQ injection (PI-Stuckey) Inject 300 mg into the skin every 6 (six) months.   No facility-administered encounter medications on file as of 09/17/2020.    Allergies (verified) Zetia [ezetimibe], Crestor [rosuvastatin], Isosorbide mononitrate [isosorbide nitrate], Pravachol [pravastatin sodium], and Silver   History: Past Medical History:  Diagnosis Date  . Allergy   . Arthritis   . CAP (community acquired pneumonia)   .  Cataract    bilateral  . Coronary artery disease    03/13/2016 DES to first OM, EF 55% // Myoview 12/19: EF 55, ant-lat infarction, no significant ischemia, Low Risk  . Heart murmur    "outgrew it" (03/13/2016)  . Hematuria   . HTN (hypertension)   . Hx of cardiovascular stress test     ETT-Myoview (10/15): Nondiagnostic EKG changes, anterolateral and inferolateral scar, no ischemia, EF 48%, low risk  . Hyperlipemia   . Hyperlipidemia    NMR Lipoprofile 2008: LDL 168 ( 2410/ 1826), HDL 41, TG 71. LDL goal = <100, ideally <  70. Framingham Study LDL goal = < 130.  Marland Kitchen Kidney stones    "passed them"; Dr. Terance Hart (03/13/2016)  . Myocardial infarction ([place]) 2001  . Non Hodgkin's lymphoma ([place])    [facility]  . Pleural effusion   . TIA (transient ischemic attack) 1980s   "I was exercising when I had it"   Past Surgical History:  Procedure Laterality Date  . CARDIAC CATHETERIZATION N/A 12/22/2015   Procedure: Left Heart Cath and Coronary Angiography;  Surgeon: Sherren Mocha, MD;  Location: [place] [facility];  Service: Cardiovascular;  Laterality: N/A;  . CARDIAC CATHETERIZATION N/A 12/22/2015   Procedure: Coronary Stent Intervention;  Surgeon: Sherren Mocha, MD;  Location: [place] [facility];  Service: Cardiovascular;  Laterality: N/A;  . CARDIAC CATHETERIZATION N/A 12/22/2015   Procedure: Intravascular Pressure Wire/FFR Study;  Surgeon: Sherren Mocha, MD;  Location: [place] [facility];  Service: Cardiovascular;  Laterality: N/A;  . CARDIAC CATHETERIZATION N/A 03/13/2016   Procedure: Left Heart Cath and Coronary Angiography;  Surgeon: Sherren Mocha, MD;  Location: [place] [facility];  Service: Cardiovascular;  Laterality: N/A;  . COLONOSCOPY  2002  . CORONARY ANGIOPLASTY WITH STENT PLACEMENT  ~ 2013   "1 stent"  . ESOPHAGOGASTRODUODENOSCOPY (EGD) WITH ESOPHAGEAL DILATION  2002  . KNEE ARTHROSCOPY Left 1990s   Family History  Problem Relation Age of Onset  . Cancer Mother        lung cancer  . Diabetes Mother   . Hyperlipidemia Mother   . Stroke Father        age 60  . Prostate cancer Father   . Cancer Sister        ?lymphoma   Social History   Socioeconomic History  . Marital status: Married    Spouse name: Not on file  . Number of children: 3  .  Years of education: Not on file  . Highest education level: Not on file  Occupational History  . Occupation: retired Higher education careers adviser  Tobacco Use  . Smoking status: Never Smoker  . Smokeless tobacco: Never Used  Vaping Use  . Vaping Use: Never used  Substance and Sexual Activity  . Alcohol use: No    Comment: drank some in my 20's"  . Drug use: No  . Sexual activity: Yes  Other Topics Concern  . Not on file  Social History Narrative  . Not on file   Social Determinants of Health   Financial Resource Strain: Low Risk   . Difficulty of Paying Living Expenses: Not hard at all  Food Insecurity: No Food Insecurity  . Worried About Charity fundraiser in the Last Year: Never true  . Ran Out of Food in the Last Year: Never true  Transportation Needs: No Transportation Needs  . Lack of Transportation (Medical): No  . Lack of Transportation (Non-Medical): No  Physical Activity: Sufficiently Active  .  Days of Exercise per Week: 5 days  . Minutes of Exercise per Session: 30 min  Stress: No Stress Concern Present  . Feeling of Stress : Not at all  Social Connections: Socially Integrated  . Frequency of Communication with Friends and Family: More than three times a week  . Frequency of Social Gatherings with Friends and Family: Once a week  . Attends Religious Services: More than 4 times per year  . Active Member of Clubs or Organizations: Yes  . Attends Archivist Meetings: More than 4 times per year  . Marital Status: Married    Tobacco Counseling Counseling given: Not Answered   Clinical Intake:  Pre-visit preparation completed: Yes  Pain : No/denies pain     Nutritional Risks: None Diabetes: No  How often do you need to have someone help you when you read instructions, pamphlets, or other written materials from your doctor or pharmacy?: 1 - Never What is the last grade level you completed in school?: Bachelor's Degree  Diabetic? no  Interpreter Needed?:  No  Information entered by :: Austin Abu, LPN   Activities of Daily Living In your present state of health, do you have any difficulty performing the following activities: 09/17/2020 08/31/2020  Hearing? N N  Vision? N N  Difficulty concentrating or making decisions? N N  Walking or climbing stairs? N N  Dressing or bathing? N N  Doing errands, shopping? N N  Preparing Food and eating ? N -  Using the Toilet? N -  In the past six months, have you accidently leaked urine? N -  Do you have problems with loss of bowel control? N -  Managing your Medications? N -  Managing your Finances? N -  Housekeeping or managing your Housekeeping? N -  Some recent data might be hidden    Patient Care Team: Binnie Rail, MD as PCP - General (Internal Medicine) Sherren Mocha, MD as PCP - Cardiology (Cardiology) Charlton Haws, [facility] as Pharmacist (Pharmacist)  Indicate any recent Medical Services you may have received from other than [facility] providers in the past year (date may be approximate).     Assessment:   This is a routine wellness examination for [place].  Hearing/Vision screen No exam data present  Dietary issues and exercise activities discussed: Current Exercise Habits: Structured exercise class, Type of exercise: strength training/weights;stretching;treadmill;walking, Time (Minutes): 60, Frequency (Times/Week): 3, Weekly Exercise (Minutes/Week): 180, Intensity: Moderate, Exercise limited by: None identified  Goals    .  Patient Stated (pt-stated)      To maintain my current health status by continuing to eat healthy, stay physically active and socially active.    Marland Kitchen  Pharmacy Care Plan      CARE PLAN ENTRY (see longitudinal plan of care for additional care plan information)  Current Barriers:  . Chronic Disease Management support, education, and care coordination needs related to Hypertension, Hyperlipidemia, and Coronary Artery Disease   Hypertension BP Readings  from Last 3 Encounters:  04/16/20 (!) 142/74  12/18/19 (!) 148/72  09/22/19 (!) 154/84 .  Pharmacist Clinical Goal(s): o Over the next 180 days, patient will work with PharmD and providers to maintain BP goal <140/90 . Current regimen:  o Amlodipine 10 mg daily . Interventions: o Discussed BP goals and benefits of medication for prevention of heart attack / stroke . Patient self care activities - Over the next 180 days, patient will: o Check BP 3-5 times weekly, document, and provide at future  appointments o Ensure daily salt intake < 2300 mg/day  Hyperlipidemia / CAD Lab Results  Component Value Date/Time   LDLCALC 150 (H) 04/16/2020 10:59 AM   LDLDIRECT 173.2 07/21/2013 08:09 AM .  Pharmacist Clinical Goal(s): o Over the next 180 days, patient will work with PharmD and providers to maintain LDL goal < 70 . Current regimen:  o clopidogrel 75 mg daily,  o aspirin 81 mg daily,  o nitroglycerin 0.4 mg as needed o [employer] study participant (PCSK9-inhibitor vs placebo) . Interventions: o Discussed cholesterol goals and benefits of medication for prevention of heart attack / stroke o Educated on cholesterol content in foods (particularly eggs) . Patient self care activities - Over the next 180 days, patient will: o Continue medications as prescribed o Reduce intake of high-cholesterol foods  Medication management . Pharmacist Clinical Goal(s): o Over the next 180 days, patient will work with PharmD and providers to maintain optimal medication adherence . Current pharmacy: [facility] mail order . Interventions o Comprehensive medication review performed. o Continue current medication management strategy . Patient self care activities - Over the next 180 days, patient will: o Focus on medication adherence by fill date o Take medications as prescribed o Report any questions or concerns to PharmD and/or provider(s)  Please see past updates related to this goal by clicking on  the "Past Updates" button in the selected goal       Depression Screen PHQ 2/9 Scores 09/17/2020 04/16/2020 03/10/2019 01/10/2019 09/10/2018 01/11/2018 12/05/2017  PHQ - 2 Score 0 0 0 0 0 0 0  PHQ- 9 Score - - - - - - 0    Fall Risk Fall Risk  09/17/2020 09/22/2019 03/10/2019 01/10/2019 09/10/2018  Falls in the past year? 0 0 0 0 0  Number falls in past yr: 0 0 0 0 0  Injury with Fall? 0 - - - -  Risk for fall due to : No Fall Risks - - - -    FALL RISK PREVENTION PERTAINING TO THE HOME:  Any stairs in or around the home? Yes  If so, are there any without handrails? No  Home free of loose throw rugs in walkways, pet beds, electrical cords, etc? Yes  Adequate lighting in your home to reduce risk of falls? Yes   ASSISTIVE DEVICES UTILIZED TO PREVENT FALLS:  Life alert? No  Use of a cane, walker or w/c? No  Grab bars in the bathroom? No  Shower chair or bench in shower? No  Elevated toilet seat or a handicapped toilet? Yes   TIMED UP AND GO:  Was the test performed? No .  Length of time to ambulate 10 feet: 0 sec.   Gait steady and fast without use of assistive device  Cognitive Function: No flowsheet data found.        Immunizations Immunization History  Administered Date(s) Administered  . Fluad Quad(high Dose 65+) 06/07/2019, 06/16/2020  . Influenza, High Dose Seasonal PF 06/24/2018  . Influenza-Unspecified 08/23/2017, 07/04/2018  . PFIZER(Purple Top)SARS-COV-2 Vaccination 10/02/2019, 10/23/2019  . Pneumococcal Conjugate-13 12/05/2017  . Pneumococcal Polysaccharide-23 08/28/2010  . Td 05/16/2006  . Zoster Recombinat (Shingrix) 07/01/2019    TDAP status: Due, Education has been provided regarding the importance of this vaccine. Advised may receive this vaccine at local pharmacy or Health Dept. Aware to provide a copy of the vaccination record if obtained from local pharmacy or Health Dept. Verbalized acceptance and understanding.  Flu Vaccine status: Up to  date  Pneumococcal vaccine  status: Up to date  Covid-19 vaccine status: Completed vaccines  Qualifies for Shingles Vaccine? Yes   Zostavax completed Yes   Shingrix Completed?: No.    Education has been provided regarding the importance of this vaccine. Patient has been advised to call insurance company to determine out of pocket expense if they have not yet received this vaccine. Advised may also receive vaccine at local pharmacy or Health Dept. Verbalized acceptance and understanding.  Screening Tests Health Maintenance  Topic Date Due  . COVID-19 Vaccine (3 - Pfizer risk 4-dose series) 11/20/2019  . COLONOSCOPY (Pts 45-26yrs Insurance coverage will need to be confirmed)  09/04/2020  . TETANUS/TDAP  04/16/2021 (Originally 05/16/2016)  . INFLUENZA VACCINE  Completed  . Hepatitis C Screening  Completed  . PNA vac Low Risk Adult  Completed    Health Maintenance  Health Maintenance Due  Topic Date Due  . COVID-19 Vaccine (3 - Pfizer risk 4-dose series) 11/20/2019  . COLONOSCOPY (Pts 45-71yrs Insurance coverage will need to be confirmed)  09/04/2020    Colorectal cancer screening: No longer required.   Lung Cancer Screening: (Low Dose CT Chest recommended if Age 42-80 years, 30 pack-year currently smoking OR have quit w/in 15years.) does not qualify.   Lung Cancer Screening Referral: no  Additional Screening:  Hepatitis C Screening: does not qualify; Completed no  Vision Screening: Recommended annual ophthalmology exams for early detection of glaucoma and other disorders of the eye. Is the patient up to date with their annual eye exam?  Yes  Who is the provider or what is the name of the office in which the patient attends annual eye exams? Marshall Cork, MD. If pt is not established with a provider, would they like to be referred to a provider to establish care? No .   Dental Screening: Recommended annual dental exams for proper oral hygiene  Community Resource Referral  / Chronic Care Management: CRR required this visit?  No   CCM required this visit?  No      Plan:     I have personally reviewed and noted the following in the patient's chart:   . Medical and social history . Use of alcohol, tobacco or illicit drugs  . Current medications and supplements . Functional ability and status . Nutritional status . Physical activity . Advanced directives . List of other physicians . Hospitalizations, surgeries, and ER visits in previous 12 months . Vitals . Screenings to include cognitive, depression, and falls . Referrals and appointments  In addition, I have reviewed and discussed with patient certain preventive protocols, quality metrics, and best practice recommendations. A written personalized care plan for preventive services as well as general preventive health recommendations were provided to patient.     Sheral Flow, LPN   [id number]   Nurse Notes:  Patient is cogitatively intact. There were no vitals filed for this visit. There is no height or weight on file to calculate BMI.

## 2020-09-17 NOTE — Patient Instructions (Signed)
Austin [name] , Thank you for taking time to come for your Medicare Wellness Visit. I appreciate your ongoing commitment to your health goals. Please review the following plan we discussed and let me know if I can assist you in the future.   Screening recommendations/referrals: Colonoscopy: last done 1/8/219; no repeat due to age Recommended yearly ophthalmology/optometry visit for glaucoma screening and checkup Recommended yearly dental visit for hygiene and checkup  Vaccinations: Influenza vaccine: 06/16/2020 Pneumococcal vaccine: completed Tdap vaccine: overdue Shingles vaccine: 07/01/2019; need second dose   Covid-19: completed  Advanced directives: Please bring a copy of your health care power of attorney and living will to the office at your convenience.  Conditions/risks identified: Yes; Reviewed health maintenance screenings with patient today and relevant education, vaccines, and/or referrals were provided. Please continue to do your personal lifestyle choices by: daily care of teeth and gums, regular physical activity (goal should be 5 days a week for 30 minutes), eat a healthy diet, avoid tobacco and drug use, limiting any alcohol intake, taking a low-dose aspirin (if not allergic or have been advised by your provider otherwise) and taking vitamins and minerals as recommended by your provider. Continue doing brain stimulating activities (puzzles, reading, adult coloring books, staying active) to keep memory sharp. Continue to eat heart healthy diet (full of fruits, vegetables, whole grains, lean protein, water--limit salt, fat, and sugar intake) and increase physical activity as tolerated.  Next appointment: Please schedule your next Medicare Wellness Visit with your Nurse Health Advisor in 1 year by calling [phone]. Preventive Care 64 Years and Older, Male Preventive care refers to lifestyle choices and visits with your health care provider that can promote health and  wellness. What does preventive care include?  A yearly physical exam. This is also called an annual well check.  Dental exams once or twice a year.  Routine eye exams. Ask your health care provider how often you should have your eyes checked.  Personal lifestyle choices, including:  Daily care of your teeth and gums.  Regular physical activity.  Eating a healthy diet.  Avoiding tobacco and drug use.  Limiting alcohol use.  Practicing safe sex.  Taking low doses of aspirin every day.  Taking vitamin and mineral supplements as recommended by your health care provider. What happens during an annual well check? The services and screenings done by your health care provider during your annual well check will depend on your age, overall health, lifestyle risk factors, and family history of disease. Counseling  Your health care provider may ask you questions about your:  Alcohol use.  Tobacco use.  Drug use.  Emotional well-being.  Home and relationship well-being.  Sexual activity.  Eating habits.  History of falls.  Memory and ability to understand (cognition).  Work and work Statistician. Screening  You may have the following tests or measurements:  Height, weight, and BMI.  Blood pressure.  Lipid and cholesterol levels. These may be checked every 5 years, or more frequently if you are over 33 years old.  Skin check.  Lung cancer screening. You may have this screening every year starting at age 66 if you have a 30-pack-year history of smoking and currently smoke or have quit within the past 15 years.  Fecal occult blood test (FOBT) of the stool. You may have this test every year starting at age 80.  Flexible sigmoidoscopy or colonoscopy. You may have a sigmoidoscopy every 5 years or a colonoscopy every 10 years starting at age 38.  Prostate cancer screening. Recommendations will vary depending on your family history and other risks.  Hepatitis C blood  test.  Hepatitis B blood test.  Sexually transmitted disease (STD) testing.  Diabetes screening. This is done by checking your blood sugar (glucose) after you have not eaten for a while (fasting). You may have this done every 1-3 years.  Abdominal aortic aneurysm (AAA) screening. You may need this if you are a current or former smoker.  Osteoporosis. You may be screened starting at age 37 if you are at high risk. Talk with your health care provider about your test results, treatment options, and if necessary, the need for more tests. Vaccines  Your health care provider may recommend certain vaccines, such as:  Influenza vaccine. This is recommended every year.  Tetanus, diphtheria, and acellular pertussis (Tdap, Td) vaccine. You may need a Td booster every 10 years.  Zoster vaccine. You may need this after age 36.  Pneumococcal 13-valent conjugate (PCV13) vaccine. One dose is recommended after age 51.  Pneumococcal polysaccharide (PPSV23) vaccine. One dose is recommended after age 37. Talk to your health care provider about which screenings and vaccines you need and how often you need them. This information is not intended to replace advice given to you by your health care provider. Make sure you discuss any questions you have with your health care provider. Document Released: 09/10/2015 Document Revised: 05/03/2016 Document Reviewed: 06/15/2015 Elsevier Interactive Patient Education  2017 [place] Prevention in the Home Falls can cause injuries. They can happen to people of all ages. There are many things you can do to make your home safe and to help prevent falls. What can I do on the outside of my home?  Regularly fix the edges of walkways and driveways and fix any cracks.  Remove anything that might make you trip as you walk through a door, such as a raised step or threshold.  Trim any bushes or trees on the path to your home.  Use bright outdoor  lighting.  Clear any walking paths of anything that might make someone trip, such as rocks or tools.  Regularly check to see if handrails are loose or broken. Make sure that both sides of any steps have handrails.  Any raised decks and porches should have guardrails on the edges.  Have any leaves, snow, or ice cleared regularly.  Use sand or salt on walking paths during winter.  Clean up any spills in your garage right away. This includes oil or grease spills. What can I do in the bathroom?  Use night lights.  Install grab bars by the toilet and in the tub and shower. Do not use towel bars as grab bars.  Use non-skid mats or decals in the tub or shower.  If you need to sit down in the shower, use a plastic, non-slip stool.  Keep the floor dry. Clean up any water that spills on the floor as soon as it happens.  Remove soap buildup in the tub or shower regularly.  Attach bath mats securely with double-sided non-slip rug tape.  Do not have throw rugs and other things on the floor that can make you trip. What can I do in the bedroom?  Use night lights.  Make sure that you have a light by your bed that is easy to reach.  Do not use any sheets or blankets that are too big for your bed. They should not hang down onto the floor.  Have  a firm chair that has side arms. You can use this for support while you get dressed.  Do not have throw rugs and other things on the floor that can make you trip. What can I do in the kitchen?  Clean up any spills right away.  Avoid walking on wet floors.  Keep items that you use a lot in easy-to-reach places.  If you need to reach something above you, use a strong step stool that has a grab bar.  Keep electrical cords out of the way.  Do not use floor polish or wax that makes floors slippery. If you must use wax, use non-skid floor wax.  Do not have throw rugs and other things on the floor that can make you trip. What can I do with my  stairs?  Do not leave any items on the stairs.  Make sure that there are handrails on both sides of the stairs and use them. Fix handrails that are broken or loose. Make sure that handrails are as long as the stairways.  Check any carpeting to make sure that it is firmly attached to the stairs. Fix any carpet that is loose or worn.  Avoid having throw rugs at the top or bottom of the stairs. If you do have throw rugs, attach them to the floor with carpet tape.  Make sure that you have a light switch at the top of the stairs and the bottom of the stairs. If you do not have them, ask someone to add them for you. What else can I do to help prevent falls?  Wear shoes that:  Do not have high heels.  Have rubber bottoms.  Are comfortable and fit you well.  Are closed at the toe. Do not wear sandals.  If you use a stepladder:  Make sure that it is fully opened. Do not climb a closed stepladder.  Make sure that both sides of the stepladder are locked into place.  Ask someone to hold it for you, if possible.  Clearly mark and make sure that you can see:  Any grab bars or handrails.  First and last steps.  Where the edge of each step is.  Use tools that help you move around (mobility aids) if they are needed. These include:  Canes.  Walkers.  Scooters.  Crutches.  Turn on the lights when you go into a dark area. Replace any light bulbs as soon as they burn out.  Set up your furniture so you have a clear path. Avoid moving your furniture around.  If any of your floors are uneven, fix them.  If there are any pets around you, be aware of where they are.  Review your medicines with your doctor. Some medicines can make you feel dizzy. This can increase your chance of falling. Ask your doctor what other things that you can do to help prevent falls. This information is not intended to replace advice given to you by your health care provider. Make sure you discuss any  questions you have with your health care provider. Document Released: 06/10/2009 Document Revised: 01/20/2016 Document Reviewed: 09/18/2014 Elsevier Interactive Patient Education  2017 [employer].

## 2020-09-18 ENCOUNTER — Other Ambulatory Visit: Payer: Self-pay | Admitting: Cardiovascular Disease

## 2020-09-24 ENCOUNTER — Other Ambulatory Visit: Payer: Self-pay | Admitting: Cardiovascular Disease

## 2020-10-07 ENCOUNTER — Encounter: Payer: Self-pay | Admitting: Internal Medicine

## 2020-10-07 DIAGNOSIS — H401122 Primary open-angle glaucoma, left eye, moderate stage: Secondary | ICD-10-CM | POA: Diagnosis not present

## 2020-10-07 DIAGNOSIS — H401111 Primary open-angle glaucoma, right eye, mild stage: Secondary | ICD-10-CM | POA: Diagnosis not present

## 2020-10-07 DIAGNOSIS — H40033 Anatomical narrow angle, bilateral: Secondary | ICD-10-CM | POA: Diagnosis not present

## 2020-10-07 DIAGNOSIS — H2513 Age-related nuclear cataract, bilateral: Secondary | ICD-10-CM | POA: Diagnosis not present

## 2020-10-12 ENCOUNTER — Telehealth: Payer: Medicare HMO

## 2020-10-16 ENCOUNTER — Encounter: Payer: Self-pay | Admitting: Internal Medicine

## 2020-10-16 DIAGNOSIS — H35033 Hypertensive retinopathy, bilateral: Secondary | ICD-10-CM | POA: Insufficient documentation

## 2020-10-25 DIAGNOSIS — C8290 Follicular lymphoma, unspecified, unspecified site: Secondary | ICD-10-CM | POA: Diagnosis not present

## 2020-10-26 ENCOUNTER — Encounter: Payer: Self-pay | Admitting: Internal Medicine

## 2020-11-05 ENCOUNTER — Telehealth: Payer: Self-pay | Admitting: Pharmacist

## 2020-11-05 NOTE — Progress Notes (Signed)
    Chronic Care Management Pharmacy Assistant   Name: Austin [name]  MRN: [id number] DOB: 04-24-1946   Reason for Encounter: General Adherence Call   Conditions to be addressed/monitored: HTN   Recent office visits:  09/01/20 Richardson Dopp PA, Cardiology 08/31/20 Dr. Quay Burow, Internal Medicine  Recent consult visits:  [facility] visits:  None in previous 6 months  Medications: Outpatient Encounter Medications as of 11/05/2020  Medication Sig  . Alcohol Swabs (B-D SINGLE USE SWABS REGULAR) PADS 1 each by Does not apply route as needed.  Marland Kitchen amLODipine (NORVASC) 10 MG tablet Take 1 tablet (10 mg total) by mouth daily. Please keep upcoming appointment in Jan 2022 for future refills. Thank you  . aspirin 81 MG tablet Take 1 tablet (81 mg total) by mouth daily.  . clopidogrel (PLAVIX) 75 MG tablet Take 1 tablet (75 mg total) by mouth daily. TAKE 1 TABLET EVERY DAY  . dorzolamide-timolol (COSOPT) 22.3-6.8 MG/ML ophthalmic solution Place 1 drop into both eyes 2 (two) times daily.   . finasteride (PROSCAR) 5 MG tablet TAKE 1 TABLET EVERY DAY  . latanoprost (XALATAN) 0.005 % ophthalmic solution Place 1 drop into both eyes at bedtime.  . nitroGLYCERIN (NITROSTAT) 0.4 MG SL tablet PLACE ONE TABLET UNDER TONGUE AS NEEDED FOR CHEST PAIN  . Study - ORION 4 - inclisiran 300 mg/1.15mL or placebo SQ injection (PI-Stuckey) Inject 300 mg into the skin every 6 (six) months.   No facility-administered encounter medications on file as of 11/05/2020.     Star Rating Drugs: No ACE/ARB   A general adherence call was made to Mr. [name] to ask how he has been doing since he last spoke with the clinical pharmacist Mendel Ryder. The patient stated that he is doing well, and that he does not have any new issues with his health. He did say that Dr. Quay Burow wanted to to keep log of his blood pressure reading but he had gotten to busy doing thins around the house to take them. He did say that he would start back  logging his blood pressure readings. The patient stated that his last reading was 137/90. The patient states that his cholesterol is also pretty good. He is watching what he eats, staying away from fried foods but does have a piece of fried fish occasionally. He states that he is not having any issues with any medications and that he gets all of his meds free from [facility]. I told the patient that I would pass along this information to [employer].   Wendy Poet, [place] [phone]   Time Spent:20

## 2020-11-09 ENCOUNTER — Other Ambulatory Visit: Payer: Self-pay

## 2020-11-09 ENCOUNTER — Encounter: Payer: Medicare HMO | Admitting: *Deleted

## 2020-11-09 DIAGNOSIS — Z006 Encounter for examination for normal comparison and control in clinical research program: Secondary | ICD-10-CM

## 2020-11-09 NOTE — Research (Signed)
Subject came into research clinic today for their ORION 4 follow up visit. During today's visit, the subject did sign the new addendum consent, before the visit began, stating that they know the drug is now FDA approved and a copy was given to the patient.   Subject Name: Austin [name]  Subject met inclusion and exclusion criteria.  The informed consent form, study requirements and expectations were reviewed with the subject and questions and concerns were addressed prior to the signing of the consent form.  The subject verbalized understanding of the trial requirements.  The subject agreed to participate in the ORION 4 trial and signed the informed consent at 0903 on 11/09/20  The informed consent was obtained prior to performance of any protocol-specific procedures for the subject.  A copy of the signed informed consent was given to the subject and a copy was placed in the subject's medical record.   Timoteo Gaul       Lab work was collected today. All concomitant medications have been reviewed and updated if applicable. There are no new AE's or SAE's to report at this time. Subject received their IP injection subcutaneously into their right abdomen and tolerated well. Next appointment scheduled for Tuesday, September 13th, 2022 @ 0900 am.

## 2020-11-30 ENCOUNTER — Telehealth: Payer: Self-pay | Admitting: Pharmacist

## 2020-11-30 NOTE — Progress Notes (Signed)
Chronic Care Management Pharmacy Assistant   Name: Austin [name]  MRN: [id number] DOB: Nov 08, 1945   Reason for Encounter: Hypertension Disease State Call   Conditions to be addressed/monitored: HTN   Recent office visits:  None ID  Recent consult visits:  None ID  Hospital visits:  None in previous 6 months  Medications: Outpatient Encounter Medications as of 11/30/2020  Medication Sig  . Alcohol Swabs (B-D SINGLE USE SWABS REGULAR) PADS 1 each by Does not apply route as needed.  Marland Kitchen amLODipine (NORVASC) 10 MG tablet Take 1 tablet (10 mg total) by mouth daily. Please keep upcoming appointment in Jan 2022 for future refills. Thank you  . aspirin 81 MG tablet Take 1 tablet (81 mg total) by mouth daily.  . clopidogrel (PLAVIX) 75 MG tablet Take 1 tablet (75 mg total) by mouth daily. TAKE 1 TABLET EVERY DAY  . dorzolamide-timolol (COSOPT) 22.3-6.8 MG/ML ophthalmic solution Place 1 drop into both eyes 2 (two) times daily.   . finasteride (PROSCAR) 5 MG tablet TAKE 1 TABLET EVERY DAY  . latanoprost (XALATAN) 0.005 % ophthalmic solution Place 1 drop into both eyes at bedtime.  . nitroGLYCERIN (NITROSTAT) 0.4 MG SL tablet PLACE ONE TABLET UNDER TONGUE AS NEEDED FOR CHEST PAIN  . Study - ORION 4 - inclisiran 300 mg/1.53mL or placebo SQ injection (PI-Stuckey) Inject 300 mg into the skin every 6 (six) months.   No facility-administered encounter medications on file as of 11/30/2020.    Reviewed chart prior to disease state call. Spoke with patient regarding BP  Recent Office Vitals: BP Readings from Last 3 Encounters:  09/01/20 (!) 142/82  08/31/20 (!) 150/80  04/16/20 (!) 142/74   Pulse Readings from Last 3 Encounters:  09/01/20 65  08/31/20 71  04/16/20 62    Wt Readings from Last 3 Encounters:  09/01/20 204 lb 9.6 oz (92.8 kg)  08/31/20 202 lb 3.2 oz (91.7 kg)  04/16/20 203 lb (92.1 kg)     Kidney Function Lab Results  Component Value Date/Time   CREATININE 1.11  08/31/2020 08:13 AM   CREATININE 1.22 (H) 04/16/2020 10:59 AM   CREATININE 1.12 04/17/2019 04:12 PM   CREATININE 1.08 03/09/2016 04:31 PM   GFR 64.86 08/31/2020 08:13 AM   GFRNONAA >60 04/17/2019 04:12 PM   GFRAA >60 04/17/2019 04:12 PM    BMP Latest Ref Rng & Units 08/31/2020 04/16/2020 04/17/2019  Glucose 70 - 99 mg/dL 95 87 95  BUN 6 - 23 mg/dL 16 18 15   Creatinine 0.40 - 1.50 mg/dL 1.11 1.22(H) 1.12  BUN/Creat Ratio 6 - 22 (calc) - 15 -  Sodium 135 - 145 mEq/L 138 141 141  Potassium 3.5 - 5.1 mEq/L 4.0 5.1 4.4  Chloride 96 - 112 mEq/L 100 103 104  CO2 19 - 32 mEq/L 30 30 29   Calcium 8.4 - 10.5 mg/dL 10.4 10.3 10.1    . Current antihypertensive regimen:  The patient takes amlodipine 10 mg 1 tab daily  . How often are you checking your Blood Pressure?         The patient states that he checks his blood pressure 2 twice a week  . Current home BP readings:         11/22/20 136/69        11/25/20 150/65        11/28/20    138/63  . What recent interventions/DTPs have been made by any provider to improve Blood Pressure control since last CPP Visit:  The patient states that he was told to take his blood pressure and keep log        of readings  . Any recent hospitalizations or ED visits since last visit with CPP?        Patient has not been to the hospital or ED  . What diet changes have been made to improve Blood Pressure Control?         The patient states that has not made any changes in his diet  . What exercise is being done to improve your Blood Pressure Control?  The patient states that he exercises twice a week at the gym and at home.  Adherence Review: Is the patient currently on ACE/ARB medication? No Does the patient have >5 day gap between last estimated fill dates? No    Ethelene Hal Clinical Pharmacist Assistant [phone]   Time spent:26

## 2021-01-10 ENCOUNTER — Other Ambulatory Visit: Payer: Self-pay | Admitting: Cardiovascular Disease

## 2021-02-11 ENCOUNTER — Ambulatory Visit (INDEPENDENT_AMBULATORY_CARE_PROVIDER_SITE_OTHER): Payer: Medicare HMO | Admitting: Pharmacist

## 2021-02-11 ENCOUNTER — Other Ambulatory Visit: Payer: Self-pay

## 2021-02-11 DIAGNOSIS — I10 Essential (primary) hypertension: Secondary | ICD-10-CM

## 2021-02-11 DIAGNOSIS — I2510 Atherosclerotic heart disease of native coronary artery without angina pectoris: Secondary | ICD-10-CM | POA: Diagnosis not present

## 2021-02-11 DIAGNOSIS — E782 Mixed hyperlipidemia: Secondary | ICD-10-CM

## 2021-02-11 NOTE — Progress Notes (Signed)
Chronic Care Management Pharmacy Note  02/11/2021 Name:  Austin [name] MRN:  [id number] DOB:  10-20-1945  Summary: -Pt is due for colonoscopy this year -Pt is enrolled in [place]) and will be eligible to get Leqvio for free after trial is over  Recommendations/Changes made from today's visit: -Advised pt to make 59-monthf/u appt with PCP   Subjective: Austin WIDDOWSONis an 75y.o. year old male who is a primary patient of Burns, [name].  The CCM team was consulted for assistance with disease management and care coordination needs.    Engaged with patient by telephone for follow up visit in response to provider referral for pharmacy case management and/or care coordination services.   Consent to Services:  The patient was given information about Chronic Care Management services, agreed to services, and gave verbal consent prior to initiation of services.  Please see initial visit note for detailed documentation.   Patient Care Team: [name] as PCP - General (Internal Medicine) [name] as PCP - Cardiology (Cardiology) FCharlton Haws [facility] Pharmacist (Pharmacist) [name] as Consulting Physician (Ophthalmology)  Recent office visits: 08/31/20 Dr BQuay BurowOV: acute visit for decreased energy after 3rd covid vaccine. Lab work stable. No changes.  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 No evidence of relapse. 10/07/20 Dr WKathlen Mody(ophthalmology): eye exam 09/01/20 PA WKathlen Mody(cardiology): f/u CAD. RTC 6 months w/ Dr CTyrell Antoniovisits: None in previous 6 months   Objective:  Lab Results  Component Value Date   CREATININE 1.11 08/31/2020   BUN 16 08/31/2020   GFR 64.86 08/31/2020   GFRNONAA >60 04/17/2019   GFRAA >60 04/17/2019   NA 138 08/31/2020   K 4.0 08/31/2020   CALCIUM 10.4 08/31/2020   CO2 30  08/31/2020   GLUCOSE 95 08/31/2020    Lab Results  Component Value Date/Time   HGBA1C 5.9 08/31/2020 08:13 AM   HGBA1C 6.0 (H) 04/16/2020 10:59 AM   GFR 64.86 08/31/2020 08:13 AM   GFR 81.75 03/14/2018 02:27 PM    Last diabetic Eye exam: No results found for: HMDIABEYEEXA  Last diabetic Foot exam: No results found for: HMDIABFOOTEX   Lab Results  Component Value Date   CHOL 221 (H) 04/16/2020   HDL 49 04/16/2020   LDLCALC 150 (H) 04/16/2020   LDLDIRECT 173.2 07/21/2013   TRIG 108 04/16/2020   CHOLHDL 4.5 04/16/2020    Hepatic Function Latest Ref Rng & Units 08/31/2020 04/16/2020 03/14/2018  Total Protein 6.0 - 8.3 g/dL 7.8 7.3 7.8  Albumin 3.5 - 5.2 g/dL 4.6 - 4.3  AST 0 - 37 U/L '19 22 17  ' ALT 0 - 53 U/L '15 17 14  ' Alk Phosphatase 39 - 117 U/L 106 - 94  Total Bilirubin 0.2 - 1.2 mg/dL 0.7 0.9 0.6  Bilirubin, Direct <=0.2 mg/dL - - -    Lab Results  Component Value Date/Time   TSH 4.40 08/31/2020 08:13 AM   TSH 2.07 04/16/2020 10:59 AM    CBC Latest Ref Rng & Units 08/31/2020 04/16/2020 04/17/2019  WBC 4.0 - 10.5 K/uL 3.8(L) 3.9 4.5  Hemoglobin 13.0 - 17.0 g/dL 14.1 13.2 13.3  Hematocrit 39.0 - 52.0 % 40.9 40.8 40.0  Platelets 150.0 - 400.0 K/uL 256.0 249 255    No results found for: VD25OH  Clinical ASCVD: Yes  The 10-year ASCVD risk  score Mikey Bussing DC Jr., et al., 2013) is: 26.3%   Values used to calculate the score:     Age: 39 years     Sex: Male     Is Non-Hispanic African American: Yes     Diabetic: No     Tobacco smoker: No     Systolic Blood Pressure: 450 mmHg     Is BP treated: Yes     HDL Cholesterol: 49 mg/dL     Total Cholesterol: 221 mg/dL    Depression screen [facility] 2/9 09/17/2020 04/16/2020 03/10/2019  Decreased Interest 0 - 0  Down, Depressed, Hopeless 0 0 0  PHQ - 2 Score 0 0 0  Altered sleeping - - -  Tired, decreased energy - - -  Change in appetite - - -  Feeling bad or failure about yourself  - - -  Trouble concentrating - - -  Moving slowly or  fidgety/restless - - -  Suicidal thoughts - - -  PHQ-9 Score - - -  Difficult doing work/chores - - -  Some recent data might be hidden     Social History   Tobacco Use  Smoking Status Never  Smokeless Tobacco Never   BP Readings from Last 3 Encounters:  09/01/20 (!) 142/82  08/31/20 (!) 150/80  04/16/20 (!) 142/74   Pulse Readings from Last 3 Encounters:  09/01/20 65  08/31/20 71  04/16/20 62   Wt Readings from Last 3 Encounters:  09/01/20 204 lb 9.6 oz (92.8 kg)  08/31/20 202 lb 3.2 oz (91.7 kg)  04/16/20 203 lb (92.1 kg)   BMI Readings from Last 3 Encounters:  09/01/20 26.99 kg/m  08/31/20 26.68 kg/m  04/16/20 26.78 kg/m    Assessment/Interventions: Review of patient past medical history, allergies, medications, health status, including review of consultants reports, laboratory and other test data, was performed as part of comprehensive evaluation and provision of chronic care management services.   SDOH:  (Social Determinants of Health) assessments and interventions performed: Yes  SDOH Screenings   Alcohol Screen: Low Risk    Last Alcohol Screening Score (AUDIT): 0  Depression (PHQ2-9): Low Risk    PHQ-2 Score: 0  Financial Resource Strain: Low Risk    Difficulty of Paying Living Expenses: Not hard at all  Food Insecurity: No Food Insecurity   Worried About Charity fundraiser in the Last Year: Never true   Ran Out of Food in the Last Year: Never true  Housing: Low Risk    Last Housing Risk Score: 0  Physical Activity: Sufficiently Active   Days of Exercise per Week: 5 days   Minutes of Exercise per Session: 30 min  Social Connections: Engineer, building services of Communication with Friends and Family: More than three times a week   Frequency of Social Gatherings with Friends and Family: Once a week   Attends Religious Services: More than 4 times per year   Active Member of [employer] or Organizations: Yes   Attends Music therapist:  More than 4 times per year   Marital Status: Married  Stress: No Stress Concern Present   Feeling of Stress : Not at all  Tobacco Use: Low Risk    Smoking Tobacco Use: Never   Smokeless Tobacco Use: Never  Transportation Needs: No Transportation Needs   Lack of Transportation (Medical): No   Lack of Transportation (Non-Medical): No    CCM Care Plan  Allergies  Allergen Reactions   Zetia [Ezetimibe] Shortness Of  Breath and Other (See Comments)    Chest pain   Crestor [Rosuvastatin] Other (See Comments)    Myalgias   Isosorbide Mononitrate [Isosorbide Nitrate] Other (See Comments)    Headache, throat soreness and hoarse   Pravachol [Pravastatin Sodium] Other (See Comments)    Myalgias   Silver Rash    Medications Reviewed Today     Reviewed by Charlton Haws, [facility] (Pharmacist) on 02/11/21 at 1316  Med List Status: <None>   Medication Order Taking? Sig Documenting Provider Last Dose Status Informant  Alcohol Swabs (B-D SINGLE USE SWABS REGULAR) PADS [id number] Yes 1 each by Does not apply route as needed. Binnie Rail, MD Taking Active   amLODipine (NORVASC) 10 MG tablet [id number] Yes Take 1 tablet (10 mg total) by mouth daily. Sherren Mocha, MD Taking Active   aspirin 81 MG tablet [id number] Yes Take 1 tablet (81 mg total) by mouth daily. Leanor Kail, PA Taking Active Self  clopidogrel (PLAVIX) 75 MG tablet [id number] Yes Take 1 tablet (75 mg total) by mouth daily. TAKE 1 TABLET EVERY Lacie Scotts, MD Taking Active   dorzolamide-timolol (COSOPT) 22.3-6.8 MG/ML ophthalmic solution [id number] Yes Place 1 drop into both eyes 2 (two) times daily.  [provider] Taking Active Self  finasteride (PROSCAR) 5 MG tablet [id number] Yes TAKE 1 TABLET EVERY DAY Sherren Mocha, MD Taking Active   latanoprost (XALATAN) 0.005 % ophthalmic solution [id number] Yes Place 1 drop into both eyes at bedtime. [provider] Taking Active   nitroGLYCERIN  (NITROSTAT) 0.4 MG SL tablet [id number] Yes PLACE ONE TABLET UNDER TONGUE AS NEEDED FOR CHEST PAIN Sherren Mocha, MD Taking Active   Study - ORION 4 - inclisiran 300 mg/1.32m or placebo SQ injection (PI-Stuckey) [id number] Inject 300 mg into the skin every 6 (six) months. [provider] Taking Active             Patient Active Problem List   Diagnosis Date Noted   Hypertensive retinopathy of both eyes 10/16/2020   Fatigue 08/31/2020   Syncope    Prediabetes 08/28/2016   CAD (coronary artery disease) 12/13/2010   Hyperlipidemia 11/12/2010   Allergic rhinitis 11/12/2010   HYPERPLASIA PROSTATE UNS W/UR OBST & OTH LUTS 12/15/2009   Essential hypertension 11/26/2008   NEPHROLITHIASIS, HX OF 06/26/2007   History of follicular lymphoma 014/43/1540   Immunization History  Administered Date(s) Administered   Fluad Quad(high Dose 65+) 06/07/2019, 06/16/2020   Influenza, High Dose Seasonal PF 06/24/2018   Influenza-Unspecified 08/23/2017, 07/04/2018   PFIZER(Purple Top)SARS-COV-2 Vaccination 10/02/2019, 10/23/2019, 07/31/2020   Pneumococcal Conjugate-13 12/05/2017   Pneumococcal Polysaccharide-23 08/28/2010   Td 05/16/2006   Zoster Recombinat (Shingrix) 07/01/2019, 11/15/2019    Conditions to be addressed/monitored:  Hypertension, Hyperlipidemia, and Coronary Artery Disease  Care Plan : [place] Updates made by FCharlton Haws RThermopolissince 02/11/2021 12:00 AM     Problem: Hypertension, Hyperlipidemia, and Coronary Artery Disease   Priority: High     Long-Range Goal: Disease management   Start Date: 02/11/2021  Expected End Date: 02/11/2022  This Visit's Progress: On track  Priority: High  Note:   Current Barriers:  Unable to independently monitor therapeutic efficacy  Pharmacist Clinical Goal(s):  Patient will achieve adherence to monitoring guidelines and medication adherence to achieve therapeutic efficacy through collaboration with PharmD  and provider.   Interventions: 1:1 collaboration with [name] regarding development and update of comprehensive plan of care as evidenced by provider  attestation and co-signature Inter-disciplinary care team collaboration (see longitudinal plan of care) Comprehensive medication review performed; medication list updated in electronic medical record   Hypertension    BP goal is:  <140/90 Patient checks BP at home 3-5x per week Patient home BP readings are ranging: 130/80   Patient has failed these meds in the past: doxazosin, metoprolol Patient is currently controlled on the following medications: amlodipine 10 mg daily   We discussed: pt endorses compliance and denies side effects   Plan: Continue current medications and control with diet and exercise    Hyperlipidemia/CAD    LDL goal < 70 CAD: MI in 2012 treated with PCI of the LCx with a BMS PCI with DES to the proximal LAD in 11/2015  LDL improved 191 > 150 from 2019 > 2021  Patient has failed these meds in past: rosuvastatin, pravastatin (myalgias), ezetimibe Patient is currently uncontrolled on the following medications: clopidogrel 75 mg daily, aspirin 81 mg daily, nitroglycerin 0.4 mg SL prn Leqvio [employer] study)   We discussed:  Pt is still enrolled in [place] trial (Inclisiran, a new PCSK9-inhibitor) and should be eligible for free medication once it is over; it is not known if patient received active drug or placebo for past 2 years.    Plan: Continue current medications and control with diet and exercise    Health Maintenance -Vaccine gaps: none -Updated vaccine record with Shingrix and Covid booster -Pt is due for colonoscopy this year - advised to f/u with PCP (due for 6 month f/u)  Patient will:  - take medications as prescribed focus on medication adherence by pill box check blood pressure weekly, document, and provide at future appointments -Make 6 month f/u appt with PCP       Medication Assistance: None required.  Patient affirms current coverage meets needs.  Compliance/Adherence/Medication fill history: Care Gaps: Covid booster (due 10/29/20) Colonoscopy (due 09/04/20)  Star-Rating Drugs: None  Patient's preferred pharmacy is:  [id number] [address], [place] [address] Phone: [phone] Fax: [phone]  [facility] - [place], [place] [address] Phone: [phone] Fax: [phone]  Uses pill box? Yes Pt endorses 100% compliance  We discussed: Current pharmacy is preferred with insurance plan and patient is satisfied with pharmacy services Patient decided to: Continue current medication management strategy  Care Plan and Follow Up Patient Decision:  Patient agrees to Care Plan and Follow-up.  Plan: Telephone follow up appointment with care management team member scheduled for:  6 months  Charlene Brooke, PharmD, [place], CPP Clinical Pharmacist [facility] [phone]

## 2021-02-11 NOTE — Patient Instructions (Signed)
Visit Information  Phone number for Pharmacist: [phone]   Goals Addressed             This Visit's Progress    Manage My Medicine       Timeframe:  Long-Range Goal Priority:  Medium Start Date:    02/11/21                         Expected End Date:  02/11/22                     Follow Up Date Dec 2022   - call for medicine refill 2 or 3 days before it runs out - call if I am sick and can't take my medicine - keep a list of all the medicines I take; vitamins and herbals too - use a pillbox to sort medicine    Why is this important?   These steps will help you keep on track with your medicines.   Notes:         Patient verbalizes understanding of instructions provided today and agrees to view in [place].  Telephone follow up appointment with pharmacy team member scheduled for: 6 months  Charlene Brooke, PharmD, [place], CPP Clinical Pharmacist [facility] [phone]

## 2021-03-15 ENCOUNTER — Other Ambulatory Visit: Payer: Self-pay

## 2021-03-15 ENCOUNTER — Encounter: Payer: Self-pay | Admitting: Physician Assistant

## 2021-03-15 ENCOUNTER — Ambulatory Visit: Payer: Medicare HMO | Admitting: Physician Assistant

## 2021-03-15 VITALS — BP 142/68 | HR 77 | Ht 73.0 in | Wt 200.8 lb

## 2021-03-15 DIAGNOSIS — E782 Mixed hyperlipidemia: Secondary | ICD-10-CM | POA: Diagnosis not present

## 2021-03-15 DIAGNOSIS — I25118 Atherosclerotic heart disease of native coronary artery with other forms of angina pectoris: Secondary | ICD-10-CM

## 2021-03-15 DIAGNOSIS — I10 Essential (primary) hypertension: Secondary | ICD-10-CM

## 2021-03-15 NOTE — Progress Notes (Signed)
Cardiology Office Note:    Date:  03/15/2021   ID:  Austin [name], DOB 1945/09/27, MRN [id number]  PCP:  Austin Rail, MD   [facility] Providers Cardiologist:  Sherren Mocha, MD Cardiology APP:  Sharmon Revere     Referring MD: Austin Rail, MD   Chief Complaint:  Follow-up (CAD)    Patient Profile:    Austin [name] is a 75 y.o. male with:  Coronary artery disease S/p Lat MI in 2012 tx with BMS to LCx PCI 4/17: DES to prox LAD PCI 7/17: DES to OM1 Residual dz: severe small vessel dz in dist PDA, Dx branches of LAD, apical LAD >> med Rx Myoview 12/19: ant-lat infarct, no ischemia Follicular lymphoma, Gr I-II (Dr. Gloriann [name], [facility]) Hypertension Hyperlipidemia ORION 4 Trial participant Hx of TIA Chest pain  Prior CV studies: Myoview 08/12/18 1. EF 55%, normal wall motion. 2. Primarily fixed medium-sized, moderate intensity basal to mid anterolateral perfusion defect with some reversibility at the base.  This appears to be primarily anterolateral infarction though there surprisingly does not appear to be a significant wall motion abnormality. 3. Borderline significant near 1 mm ST depression V4 and V5 at peak exercise, resolving quickly in recovery. Overall, I think this is a low risk study.  Most consistent with anterolateral infarction.    Carotid US 11/12/16 Summary: Findings suggest 1-39% internal carotid artery stenosis bilaterally. Vertebral arteries are patent with antegrade flow.   Echo 1/18 EF 27-25, grade 1 diastolic dysfunction, trivial AI, dilated aortic root (42), normal RVSF, trivial pericardial effusion   CARDIAC CATHETERIZATION 03/13/16 LAD proximal stent patent, distal 90 (unchanged from previous study), lateral D1 90-unchanged from previous study; D2 90-unchanged in previous study LCx mid 100-CTO; OM1 95 (de novo), stent patent with 25 ISR RPDA ostial 50, 80 Normal EF 55 PCI: 2.75 x 18 mm Xience Alpine DES to OM1        History of Present Illness: Austin [name] returns for follow-up.  He is here alone.  He had some chest wall pain after doing pull-ups recently.  This resolved.  He did not take nitroglycerin.  He has not had exertional chest symptoms.  He has not had shortness of breath, orthopnea, leg edema or syncope.  He exercises on a regular basis and tries to maintain a heart healthy diet.        Past Medical History:  Diagnosis Date   Allergy    Arthritis    CAP (community acquired pneumonia)    Cataract    bilateral   Coronary artery disease    03/13/2016 DES to first OM, EF 55% // Myoview 12/19: EF 55, ant-lat infarction, no significant ischemia, Low Risk   Heart murmur    "outgrew it" (03/13/2016)   Hematuria    HTN (hypertension)    Hx of cardiovascular stress test    ETT-Myoview (10/15): Nondiagnostic EKG changes, anterolateral and inferolateral scar, no ischemia, EF 48%, low risk   Hyperlipemia    Hyperlipidemia    NMR Lipoprofile 2008: LDL 168 ( 2410/ 1826), HDL 41, TG 71. LDL goal = <100, ideally <  70. Framingham Study LDL goal = < 130.   Kidney stones    "passed them"; Dr. Terance Hart (03/13/2016)   Myocardial infarction [facility]) 2001   Non Hodgkin's [facility])    [facility]   Pleural effusion    TIA (transient ischemic attack) 1980s   "I was exercising when I had it"  Current Medications: Current Meds  Medication Sig   Alcohol Swabs (B-D SINGLE USE SWABS REGULAR) PADS 1 each by Does not apply route as needed.   amLODipine (NORVASC) 10 MG tablet Take 1 tablet (10 mg total) by mouth daily.   aspirin 81 MG tablet Take 1 tablet (81 mg total) by mouth daily.   clopidogrel (PLAVIX) 75 MG tablet Take 1 tablet (75 mg total) by mouth daily. TAKE 1 TABLET EVERY DAY   dorzolamide-timolol (COSOPT) 22.3-6.8 MG/ML ophthalmic solution Place 1 drop into both eyes 2 (two) times daily.    finasteride (PROSCAR) 5 MG tablet TAKE 1 TABLET EVERY DAY   latanoprost (XALATAN) 0.005 %  ophthalmic solution Place 1 drop into both eyes at bedtime.   nitroGLYCERIN (NITROSTAT) 0.4 MG SL tablet PLACE ONE TABLET UNDER TONGUE AS NEEDED FOR CHEST PAIN   Study - ORION 4 - inclisiran 300 mg/1.31mL or placebo SQ injection (PI-Stuckey) Inject 300 mg into the skin every 6 (six) months.     Allergies:   Zetia [ezetimibe], Crestor [rosuvastatin], Isosorbide mononitrate [isosorbide nitrate], Pravachol [pravastatin sodium], and Silver   Social History   Tobacco Use   Smoking status: Never   Smokeless tobacco: Never  Vaping Use   Vaping Use: Never used  Substance Use Topics   Alcohol use: No    Comment: drank some in my 20's"   Drug use: No     Family Hx: The patient's family history includes Cancer in his mother and sister; Diabetes in his mother; Hyperlipidemia in his mother; Prostate cancer in his father; Stroke in his father.  Review of Systems  Cardiovascular:  Negative for claudication.    EKGs/Labs/Other Test Reviewed:    EKG:  EKG is not ordered today.  The ekg ordered today demonstrates N/A  Recent Labs: 08/31/2020: ALT 15; BUN 16; Creatinine, Ser 1.11; Hemoglobin 14.1; Platelets 256.0; Potassium 4.0; Sodium 138; TSH 4.40   Recent Lipid Panel Lab Results  Component Value Date/Time   CHOL 221 (H) 04/16/2020 10:59 AM   TRIG 108 04/16/2020 10:59 AM   HDL 49 04/16/2020 10:59 AM   LDLCALC 150 (H) 04/16/2020 10:59 AM   LDLDIRECT 173.2 07/21/2013 08:09 AM      Risk Assessment/Calculations:      Physical Exam:    VS:  BP (!) 142/68   Pulse 77   Ht 6\' 1"  (1.854 m)   Wt 200 lb 12.8 oz (91.1 kg)   SpO2 97%   BMI 26.49 kg/m     Wt Readings from Last 3 Encounters:  03/15/21 200 lb 12.8 oz (91.1 kg)  09/01/20 204 lb 9.6 oz (92.8 kg)  08/31/20 202 lb 3.2 oz (91.7 kg)     Constitutional:      Appearance: Healthy appearance. Not in distress.  Neck:     Vascular: No carotid bruit. JVD normal.  Pulmonary:     Effort: Pulmonary effort is normal.     Breath  sounds: No wheezing. No rales.  Cardiovascular:     Normal rate. Regular rhythm. Normal S1. Normal S2.      Murmurs: There is no murmur.  Edema:    Peripheral edema absent.  Abdominal:     Palpations: Abdomen is soft.  Skin:    General: Skin is warm and dry.  Neurological:     General: No focal deficit present.     Mental Status: Alert and oriented to person, place and time.     Cranial Nerves: Cranial nerves are intact.  ASSESSMENT & PLAN:    1. Coronary artery disease of native artery of native heart with stable angina pectoris [facility]) History of prior myocardial infarction and multiple PCI procedures.  Last PCI in July 2017 was with a DES to the OM1.  He has residual small vessel disease and distal vessel disease which is treated medically.  He is doing well without significant anginal symptoms.  Continue current dose of amlodipine, aspirin, clopidogrel.  Follow-up with Dr. Burt Knack or me in 6 months.  2. Essential hypertension BP elevated here.  He notes more optimal readings at home.  I have asked him to monitor his BP at home and let us know if it is consistently high.  Continue current dose of Amlodpine.   3. Mixed hyperlipidemia Intol of lipid lowering agents.  He has been in the [place] 4 Trial.  Once he has completed the trial, we should try to get him on open label Inclisiran.    Dispo:  Return in about 6 months (around 09/15/2021) for Routine follow up in 6 months with Dr.Cooper or Richardson Dopp, PA. .   Medication Adjustments/Labs and Tests Ordered: Current medicines are reviewed at length with the patient today.  Concerns regarding medicines are outlined above.  Tests Ordered: No orders of the defined types were placed in this encounter.  Medication Changes: No orders of the defined types were placed in this encounter.   Signed, Richardson Dopp, PA-C  03/15/2021 3:59 PM    [facility] [place], [place], [place]  [address] Phone: [phone]; Fax: [phone]

## 2021-03-15 NOTE — Patient Instructions (Signed)
Medication Instructions:   Your physician recommends that you continue on your current medications as directed. Please refer to the Current Medication list given to you today.  *If you need a refill on your cardiac medications before your next appointment, please call your pharmacy*   Lab Work:  -NONE  If you have labs (blood work) drawn today and your tests are completely normal, you will receive your results only by: [place] (if you have MyChart) OR A paper copy in the mail If you have any lab test that is abnormal or we need to change your treatment, we will call you to review the results.   Testing/Procedures:  -NONE   Follow-Up: At [facility], you and your health needs are our priority.  As part of our continuing mission to provide you with exceptional heart care, we have created designated Provider Care Teams.  These Care Teams include your primary Cardiologist (physician) and Advanced Practice Providers (APPs -  Physician Assistants and Nurse Practitioners) who all work together to provide you with the care you need, when you need it.  We recommend signing up for the patient portal called "MyChart".  Sign up information is provided on this After Visit Summary.  MyChart is used to connect with patients for Virtual Visits (Telemedicine).  Patients are able to view lab/test results, encounter notes, upcoming appointments, etc.  Non-urgent messages can be sent to your provider as well.   To learn more about what you can do with MyChart, go to [email or web address].    Your next appointment:   6 month(s)  The format for your next appointment:   In Person  Provider:   You may see Sherren Mocha, MD or one of the following Advanced Practice Providers on your designated Care Team:   Richardson Dopp, [place]   Other Instructions  Please call office at [phone] for send my chart message if your systolic ( top number) of your blood pressure is greater than 140 X 3  consecutively.    Your physician wants you to follow-up in: 6 months with Dr.Cooper or Richardson Dopp, PA-C.  You will receive a reminder letter in the mail two months in advance. If you don't receive a letter, please call our office to schedule the follow-up appointment.

## 2021-04-07 DIAGNOSIS — H40033 Anatomical narrow angle, bilateral: Secondary | ICD-10-CM | POA: Diagnosis not present

## 2021-04-07 DIAGNOSIS — H401122 Primary open-angle glaucoma, left eye, moderate stage: Secondary | ICD-10-CM | POA: Diagnosis not present

## 2021-04-07 DIAGNOSIS — H401111 Primary open-angle glaucoma, right eye, mild stage: Secondary | ICD-10-CM | POA: Diagnosis not present

## 2021-04-28 ENCOUNTER — Telehealth: Payer: Self-pay | Admitting: Pharmacist

## 2021-04-28 NOTE — Chronic Care Management (AMB) (Signed)
Chronic Care Management Pharmacy Assistant   Name: Austin [name]  MRN: [id number] DOB: 1946/04/30   Reason for Encounter: Disease State   Conditions to be addressed/monitored: HTN   Recent office visits:  None ID  Recent consult visits:  03/15/21 Richardson Dopp T, PA-C-Cardiology (Coronary artery disease of native artery of native heart with stable angina pectoris) No med changes  Hospital visits:  None in previous 6 months  Medications: Outpatient Encounter Medications as of 04/28/2021  Medication Sig   Alcohol Swabs (B-D SINGLE USE SWABS REGULAR) PADS 1 each by Does not apply route as needed.   amLODipine (NORVASC) 10 MG tablet Take 1 tablet (10 mg total) by mouth daily.   aspirin 81 MG tablet Take 1 tablet (81 mg total) by mouth daily.   clopidogrel (PLAVIX) 75 MG tablet Take 1 tablet (75 mg total) by mouth daily. TAKE 1 TABLET EVERY DAY   dorzolamide-timolol (COSOPT) 22.3-6.8 MG/ML ophthalmic solution Place 1 drop into both eyes 2 (two) times daily.    finasteride (PROSCAR) 5 MG tablet TAKE 1 TABLET EVERY DAY   latanoprost (XALATAN) 0.005 % ophthalmic solution Place 1 drop into both eyes at bedtime.   nitroGLYCERIN (NITROSTAT) 0.4 MG SL tablet PLACE ONE TABLET UNDER TONGUE AS NEEDED FOR CHEST PAIN   Study - ORION 4 - inclisiran 300 mg/1.15m or placebo SQ injection (PI-Stuckey) Inject 300 mg into the skin every 6 (six) months.   No facility-administered encounter medications on file as of 04/28/2021.    Recent Office Vitals: BP Readings from Last 3 Encounters:  03/15/21 (!) 142/68  09/01/20 (!) 142/82  08/31/20 (!) 150/80   Pulse Readings from Last 3 Encounters:  03/15/21 77  09/01/20 65  08/31/20 71    Wt Readings from Last 3 Encounters:  03/15/21 200 lb 12.8 oz (91.1 kg)  09/01/20 204 lb 9.6 oz (92.8 kg)  08/31/20 202 lb 3.2 oz (91.7 kg)     Kidney Function Lab Results  Component Value Date/Time   CREATININE 1.11 08/31/2020 08:13 AM   CREATININE 1.22  (H) 04/16/2020 10:59 AM   CREATININE 1.12 04/17/2019 04:12 PM   CREATININE 1.08 03/09/2016 04:31 PM   GFR 64.86 08/31/2020 08:13 AM   GFRNONAA >60 04/17/2019 04:12 PM   GFRAA >60 04/17/2019 04:12 PM    BMP Latest Ref Rng & Units 08/31/2020 04/16/2020 04/17/2019  Glucose 70 - 99 mg/dL 95 87 95  BUN 6 - 23 mg/dL '16 18 15  '$ Creatinine 0.40 - 1.50 mg/dL 1.11 1.22(H) 1.12  BUN/Creat Ratio 6 - 22 (calc) - 15 -  Sodium 135 - 145 mEq/L 138 141 141  Potassium 3.5 - 5.1 mEq/L 4.0 5.1 4.4  Chloride 96 - 112 mEq/L 100 103 104  CO2 19 - 32 mEq/L '30 30 29  '$ Calcium 8.4 - 10.5 mg/dL 10.4 10.3 10.1     Contacted patient on 04/28/21 to discuss hypertension disease state  Current antihypertensive regimen:  Amlodipine 10 mg 1 tab daily  Patient verbally confirms he is taking the above medications as directed. Yes  How often are you checking your Blood Pressure? daily  he checks his blood pressure in the morning before taking his medication.  Current home BP readings: 142/70  Wrist or arm cuff: wrist cuff Caffeine intake:Every now ant then he drinks coffee, ginger ale Salt intake:Eats very little salt OTC medications including pseudoephedrine or NSAIDs?  Any readings above 180/120? No  What recent interventions/DTPs have been made by any provider to  improve Blood Pressure control since last CPP Visit: None noted  Any recent hospitalizations or ED visits since last visit with CPP? No  What diet changes have been made to improve Blood Pressure Control?  Patient has not changed his diet, he does not eat pork   What exercise is being done to improve your Blood Pressure Control?  Patient works out twice a week at the gym  Adherence Review: Is the patient currently on ACE/ARB medication? No Does the patient have >5 day gap between last estimated fill dates? No   Star Rating Drugs: None noted   Care Gaps: Last annual wellness visit:04/16/20   CCM appointment on 08/12/21  Ethelene Hal Clinical Pharmacist Assistant [phone]   Time spent:30

## 2021-05-10 ENCOUNTER — Other Ambulatory Visit: Payer: Self-pay

## 2021-05-10 DIAGNOSIS — Z006 Encounter for examination for normal comparison and control in clinical research program: Secondary | ICD-10-CM

## 2021-05-10 NOTE — Research (Signed)
Patient seen in clinic for [place] 4 visit 8. Denies any adverse events or hospitalizations since his last visit. IP medication injected into the subcutaneous tissue in the Lt abdominal area. Patient tolerated well, without any compliants. Next visit will be November 07, 2021 at 9 am

## 2021-05-20 DIAGNOSIS — H401122 Primary open-angle glaucoma, left eye, moderate stage: Secondary | ICD-10-CM | POA: Diagnosis not present

## 2021-05-20 DIAGNOSIS — H401111 Primary open-angle glaucoma, right eye, mild stage: Secondary | ICD-10-CM | POA: Diagnosis not present

## 2021-05-20 DIAGNOSIS — H40033 Anatomical narrow angle, bilateral: Secondary | ICD-10-CM | POA: Diagnosis not present

## 2021-05-30 ENCOUNTER — Other Ambulatory Visit: Payer: Self-pay | Admitting: Cardiovascular Disease

## 2021-06-07 ENCOUNTER — Telehealth: Payer: Self-pay

## 2021-06-07 NOTE — Progress Notes (Signed)
Chronic Care Management Pharmacy Assistant   Name: Austin [name]  MRN: [id number] DOB: 1945-11-19   Reason for Encounter: Disease State   Conditions to be addressed/monitored: HTN   Recent office visits:  None ID  Recent consult visits:  None ID  Hospital visits:  None in previous 6 months  Medications: Outpatient Encounter Medications as of 06/07/2021  Medication Sig   Alcohol Swabs (B-D SINGLE USE SWABS REGULAR) PADS 1 each by Does not apply route as needed.   amLODipine (NORVASC) 10 MG tablet Take 1 tablet (10 mg total) by mouth daily.   aspirin 81 MG tablet Take 1 tablet (81 mg total) by mouth daily.   clopidogrel (PLAVIX) 75 MG tablet Take 1 tablet (75 mg total) by mouth daily. TAKE 1 TABLET EVERY DAY   dorzolamide-timolol (COSOPT) 22.3-6.8 MG/ML ophthalmic solution Place 1 drop into both eyes 2 (two) times daily.    finasteride (PROSCAR) 5 MG tablet TAKE 1 TABLET EVERY DAY   latanoprost (XALATAN) 0.005 % ophthalmic solution Place 1 drop into both eyes at bedtime.   nitroGLYCERIN (NITROSTAT) 0.4 MG SL tablet PLACE 1 TABLET UNDER THE TONGUE AS NEEDED FOR CHEST PAIN   Study - ORION 4 - inclisiran 300 mg/1.21mL or placebo SQ injection (PI-Stuckey) Inject 300 mg into the skin every 6 (six) months.   No facility-administered encounter medications on file as of 06/07/2021.   Reviewed chart prior to disease state call. Spoke with patient regarding BP  Recent Office Vitals: BP Readings from Last 3 Encounters:  03/15/21 (!) 142/68  09/01/20 (!) 142/82  08/31/20 (!) 150/80   Pulse Readings from Last 3 Encounters:  03/15/21 77  09/01/20 65  08/31/20 71    Wt Readings from Last 3 Encounters:  03/15/21 200 lb 12.8 oz (91.1 kg)  09/01/20 204 lb 9.6 oz (92.8 kg)  08/31/20 202 lb 3.2 oz (91.7 kg)     Kidney Function Lab Results  Component Value Date/Time   CREATININE 1.11 08/31/2020 08:13 AM   CREATININE 1.22 (H) 04/16/2020 10:59 AM   CREATININE 1.12  04/17/2019 04:12 PM   CREATININE 1.08 03/09/2016 04:31 PM   GFR 64.86 08/31/2020 08:13 AM   GFRNONAA >60 04/17/2019 04:12 PM   GFRAA >60 04/17/2019 04:12 PM    BMP Latest Ref Rng & Units 08/31/2020 04/16/2020 04/17/2019  Glucose 70 - 99 mg/dL 95 87 95  BUN 6 - 23 mg/dL 16 18 15   Creatinine 0.40 - 1.50 mg/dL 1.11 1.22(H) 1.12  BUN/Creat Ratio 6 - 22 (calc) - 15 -  Sodium 135 - 145 mEq/L 138 141 141  Potassium 3.5 - 5.1 mEq/L 4.0 5.1 4.4  Chloride 96 - 112 mEq/L 100 103 104  CO2 19 - 32 mEq/L 30 30 29   Calcium 8.4 - 10.5 mg/dL 10.4 10.3 10.1    Current antihypertensive regimen:  Amlodipine 10 mg  How often are you checking your Blood Pressure? 3-5x per week  Current home BP readings: Patient reports on 06/04/21 blood pressure was 120/79  What recent interventions/DTPs have been made by any provider to improve Blood Pressure control since last CPP Visit: None noted  Any recent hospitalizations or ED visits since last visit with CPP? No  What diet changes have been made to improve Blood Pressure Control?  Patient reports that he eats healthy baked foods, eats in moderation What exercise is being done to improve your Blood Pressure Control?  Patient reports that he goes to the gym 2 or 3 days  a week, does plenty of church activities  Adherence Review: Is the patient currently on ACE/ARB medication? No Does the patient have >5 day gap between last estimated fill dates? No   Care Gaps: Colonoscopy-NA Diabetic Foot Exam-NA Mammogram-NA Ophthalmology-NA Dexa Scan - NA Annual Well Visit - NA Micro albumin-NA Hemoglobin A1c- 5.9, 08/31/20  Star Rating Drugs: None ID  Ethelene Hal Clinical Pharmacist Assistant [phone]

## 2021-06-16 ENCOUNTER — Encounter: Payer: Medicare HMO | Admitting: Internal Medicine

## 2021-06-20 NOTE — Patient Instructions (Addendum)
Flu immunization administered today.     Blood work was ordered.      Medications changes include :   none    Please followup in 1 year   Health Maintenance, Male Adopting a healthy lifestyle and getting preventive care are important in promoting health and wellness. Ask your health care provider about: The right schedule for you to have regular tests and exams. Things you can do on your own to prevent diseases and keep yourself healthy. What should I know about diet, weight, and exercise? Eat a healthy diet  Eat a diet that includes plenty of vegetables, fruits, low-fat dairy products, and lean protein. Do not eat a lot of foods that are high in solid fats, added sugars, or sodium. Maintain a healthy weight Body mass index (BMI) is a measurement that can be used to identify possible weight problems. It estimates body fat based on height and weight. Your health care provider can help determine your BMI and help you achieve or maintain a healthy weight. Get regular exercise Get regular exercise. This is one of the most important things you can do for your health. Most adults should: Exercise for at least 150 minutes each week. The exercise should increase your heart rate and make you sweat (moderate-intensity exercise). Do strengthening exercises at least twice a week. This is in addition to the moderate-intensity exercise. Spend less time sitting. Even light physical activity can be beneficial. Watch cholesterol and blood lipids Have your blood tested for lipids and cholesterol at 76 years of age, then have this test every 5 years. You may need to have your cholesterol levels checked more often if: Your lipid or cholesterol levels are high. You are older than 76 years of age. You are at high risk for heart disease. What should I know about cancer screening? Many types of cancers can be detected early and may often be prevented. Depending on your health history and family  history, you may need to have cancer screening at various ages. This may include screening for: Colorectal cancer. Prostate cancer. Skin cancer. Lung cancer. What should I know about heart disease, diabetes, and high blood pressure? Blood pressure and heart disease High blood pressure causes heart disease and increases the risk of stroke. This is more likely to develop in people who have high blood pressure readings, are of African descent, or are overweight. Talk with your health care provider about your target blood pressure readings. Have your blood pressure checked: Every 3-5 years if you are 81-12 years of age. Every year if you are 57 years old or older. If you are between the ages of 2 and 70 and are a current or former smoker, ask your health care provider if you should have a one-time screening for abdominal aortic aneurysm (AAA). Diabetes Have regular diabetes screenings. This checks your fasting blood sugar level. Have the screening done: Once every three years after age 78 if you are at a normal weight and have a low risk for diabetes. More often and at a younger age if you are overweight or have a high risk for diabetes. What should I know about preventing infection? Hepatitis B If you have a higher risk for hepatitis B, you should be screened for this virus. Talk with your health care provider to find out if you are at risk for hepatitis B infection. Hepatitis C Blood testing is recommended for: Everyone born from [birth year] through 1966. Anyone with known risk factors for hepatitis  C. Sexually transmitted infections (STIs) You should be screened each year for STIs, including gonorrhea and chlamydia, if: You are sexually active and are younger than 75 years of age. You are older than 76 years of age and your health care provider tells you that you are at risk for this type of infection. Your sexual activity has changed since you were last screened, and you are at increased risk  for chlamydia or gonorrhea. Ask your health care provider if you are at risk. Ask your health care provider about whether you are at high risk for HIV. Your health care provider may recommend a prescription medicine to help prevent HIV infection. If you choose to take medicine to prevent HIV, you should first get tested for HIV. You should then be tested every 3 months for as long as you are taking the medicine. Follow these instructions at home: Lifestyle Do not use any products that contain nicotine or tobacco, such as cigarettes, e-cigarettes, and chewing tobacco. If you need help quitting, ask your health care provider. Do not use street drugs. Do not share needles. Ask your health care provider for help if you need support or information about quitting drugs. Alcohol use Do not drink alcohol if your health care provider tells you not to drink. If you drink alcohol: Limit how much you have to 0-2 drinks a day. Be aware of how much alcohol is in your drink. In the [place] one drink equals one 12 oz bottle of beer (355 mL), one 5 oz glass of wine (148 mL), or one 1 oz glass of hard liquor (44 mL). General instructions Schedule regular health, dental, and eye exams. Stay current with your vaccines. Tell your health care provider if: You often feel depressed. You have ever been abused or do not feel safe at home. Summary Adopting a healthy lifestyle and getting preventive care are important in promoting health and wellness. Follow your health care provider's instructions about healthy diet, exercising, and getting tested or screened for diseases. Follow your health care provider's instructions on monitoring your cholesterol and blood pressure. This information is not intended to replace advice given to you by your health care provider. Make sure you discuss any questions you have with your health care provider. Document Revised: 10/22/2020 Document Reviewed: 08/07/2018 Elsevier Patient  Education  2022 [employer].

## 2021-06-20 NOTE — Progress Notes (Signed)
Subjective:    Patient ID: Austin [name], male    DOB: 02/19/1946, 75 y.o.   MRN: [id number]   This visit occurred during the SARS-CoV-2 public health emergency.  Safety protocols were in place, including screening questions prior to the visit, additional usage of staff PPE, and extensive cleaning of exam room while observing appropriate contact time as indicated for disinfecting solutions.   HPI He is here for a physical exam.   He is having more constipation.  He started eating cereal and fruits in the morning and that has helped.  He otherwise feels good and has no concerns.   Medications and allergies reviewed with patient and updated if appropriate.  Patient Active Problem List   Diagnosis Date Noted   Hypertensive retinopathy of both eyes 10/16/2020   Fatigue 08/31/2020   Syncope    Prediabetes 08/28/2016   CAD (coronary artery disease) 12/13/2010   Hyperlipidemia 11/12/2010   Allergic rhinitis 11/12/2010   HYPERPLASIA PROSTATE UNS W/UR OBST & OTH LUTS 12/15/2009   Essential hypertension 11/26/2008   NEPHROLITHIASIS, HX OF 06/26/2007   History of follicular lymphoma 44/96/7591    Current Outpatient Medications on File Prior to Visit  Medication Sig Dispense Refill   Alcohol Swabs (B-D SINGLE USE SWABS REGULAR) PADS 1 each by Does not apply route as needed. 3 each 3   amLODipine (NORVASC) 10 MG tablet Take 1 tablet (10 mg total) by mouth daily. 90 tablet 3   aspirin 81 MG tablet Take 1 tablet (81 mg total) by mouth daily. 30 tablet    clopidogrel (PLAVIX) 75 MG tablet Take 1 tablet (75 mg total) by mouth daily. TAKE 1 TABLET EVERY DAY 90 tablet 3   dorzolamide-timolol (COSOPT) 22.3-6.8 MG/ML ophthalmic solution Place 1 drop into both eyes 2 (two) times daily.      finasteride (PROSCAR) 5 MG tablet TAKE 1 TABLET EVERY DAY 90 tablet 3   latanoprost (XALATAN) 0.005 % ophthalmic solution Place 1 drop into both eyes at bedtime.     nitroGLYCERIN (NITROSTAT) 0.4 MG  SL tablet PLACE 1 TABLET UNDER THE TONGUE AS NEEDED FOR CHEST PAIN 25 tablet 9   Study - ORION 4 - inclisiran 300 mg/1.68mL or placebo SQ injection (PI-Stuckey) Inject 300 mg into the skin every 6 (six) months.     No current facility-administered medications on file prior to visit.    Past Medical History:  Diagnosis Date   Allergy    Arthritis    CAP (community acquired pneumonia)    Cataract    bilateral   Coronary artery disease    03/13/2016 DES to first OM, EF 55% // Myoview 12/19: EF 55, ant-lat infarction, no significant ischemia, Low Risk   Heart murmur    "outgrew it" (03/13/2016)   Hematuria    HTN (hypertension)    Hx of cardiovascular stress test    ETT-Myoview (10/15): Nondiagnostic EKG changes, anterolateral and inferolateral scar, no ischemia, EF 48%, low risk   Hyperlipemia    Hyperlipidemia    NMR Lipoprofile 2008: LDL 168 ( 2410/ 1826), HDL 41, TG 71. LDL goal = <100, ideally <  70. Framingham Study LDL goal = < 130.   Kidney stones    "passed them"; Dr. Terance Hart (03/13/2016)   Myocardial infarction [facility]) 2001   Non Hodgkin's lymphoma ([place])    [facility]   Pleural effusion    TIA (transient ischemic attack) 1980s   "I was exercising when I had it"  Past Surgical History:  Procedure Laterality Date   CARDIAC CATHETERIZATION N/A 12/22/2015   Procedure: Left Heart Cath and Coronary Angiography;  Surgeon: Sherren Mocha, MD;  Location: [place] [facility];  Service: Cardiovascular;  Laterality: N/A;   CARDIAC CATHETERIZATION N/A 12/22/2015   Procedure: Coronary Stent Intervention;  Surgeon: Sherren Mocha, MD;  Location: [place] [facility];  Service: Cardiovascular;  Laterality: N/A;   CARDIAC CATHETERIZATION N/A 12/22/2015   Procedure: Intravascular Pressure Wire/FFR Study;  Surgeon: Sherren Mocha, MD;  Location: [place] [facility];  Service: Cardiovascular;  Laterality: N/A;   CARDIAC CATHETERIZATION N/A 03/13/2016   Procedure: Left Heart Cath and  Coronary Angiography;  Surgeon: Sherren Mocha, MD;  Location: [place] [facility];  Service: Cardiovascular;  Laterality: N/A;   COLONOSCOPY  2002   CORONARY ANGIOPLASTY WITH STENT PLACEMENT  ~ 2013   "1 stent"   ESOPHAGOGASTRODUODENOSCOPY (EGD) WITH ESOPHAGEAL DILATION  2002   KNEE ARTHROSCOPY Left 1990s    Social History   Socioeconomic History   Marital status: Married    Spouse name: Not on file   Number of children: 3   Years of education: Not on file   Highest education level: Not on file  Occupational History   Occupation: retired Higher education careers adviser  Tobacco Use   Smoking status: Never   Smokeless tobacco: Never  Vaping Use   Vaping Use: Never used  Substance and Sexual Activity   Alcohol use: No    Comment: drank some in my 20's"   Drug use: No   Sexual activity: Yes  Other Topics Concern   Not on file  Social History Narrative   Not on file   Social Determinants of Health   Financial Resource Strain: Low Risk    Difficulty of Paying Living Expenses: Not hard at all  Food Insecurity: No Food Insecurity   Worried About Charity fundraiser in the Last Year: Never true   [place] in the Last Year: Never true  Transportation Needs: No Transportation Needs   Lack of Transportation (Medical): No   Lack of Transportation (Non-Medical): No  Physical Activity: Sufficiently Active   Days of Exercise per Week: 5 days   Minutes of Exercise per Session: 30 min  Stress: No Stress Concern Present   Feeling of Stress : Not at all  Social Connections: Socially Integrated   Frequency of Communication with Friends and Family: More than three times a week   Frequency of Social Gatherings with Friends and Family: Once a week   Attends Religious Services: More than 4 times per year   Active Member of [employer] or Organizations: Yes   Attends Music therapist: More than 4 times per year   Marital Status: Married    Family History  Problem Relation Age of Onset    Cancer Mother        lung cancer   Diabetes Mother    Hyperlipidemia Mother    Stroke Father        age 26   Prostate cancer Father    Cancer Sister        ?lymphoma    Review of Systems  Constitutional:  Negative for chills and fever.  Eyes:  Negative for visual disturbance.  Respiratory:  Negative for cough, shortness of breath and wheezing.   Cardiovascular:  Positive for chest pain (once in a while). Negative for palpitations and leg swelling.  Gastrointestinal:  Positive for constipation. Negative for abdominal pain, blood in  stool, diarrhea and nausea.       No gerd  Genitourinary:  Negative for difficulty urinating, dysuria and hematuria.  Musculoskeletal:  Positive for arthralgias (mild) and back pain (occ).  Skin:  Negative for rash.  Neurological:  Negative for light-headedness, numbness and headaches.  Psychiatric/Behavioral:  Negative for dysphoric mood. The patient is not nervous/anxious.       Objective:   Vitals:   06/21/21 0830  BP: 138/70  Pulse: 81  Temp: 98 F (36.7 C)  SpO2: 98%   Filed Weights   06/21/21 0830  Weight: 199 lb (90.3 kg)   Body mass index is 26.25 kg/m.  BP Readings from Last 3 Encounters:  06/21/21 138/70  03/15/21 (!) 142/68  09/01/20 (!) 142/82    Wt Readings from Last 3 Encounters:  06/21/21 199 lb (90.3 kg)  03/15/21 200 lb 12.8 oz (91.1 kg)  09/01/20 204 lb 9.6 oz (92.8 kg)     Physical Exam Constitutional: He appears well-developed and well-nourished. No distress.  HENT:  Head: Normocephalic and atraumatic.  Right Ear: External ear normal.  Left Ear: External ear normal.  Mouth/Throat: Oropharynx is clear and moist.  Normal ear canals and TM b/l  Eyes: Conjunctivae and EOM are normal.  Neck: Neck supple. No tracheal deviation present. No thyromegaly present.  No carotid bruit  Cardiovascular: Normal rate, regular rhythm, normal heart sounds and intact distal pulses.   No murmur heard. Pulmonary/Chest: Effort  normal and breath sounds normal. No respiratory distress. He has no wheezes. He has no rales.  Abdominal: Soft. He exhibits no distension. There is no tenderness.  Genitourinary: deferred  Musculoskeletal: He exhibits no edema.  Lymphadenopathy:   He has no cervical adenopathy.  Skin: Skin is warm and dry. He is not diaphoretic.  Psychiatric: He has a normal mood and affect. His behavior is normal.         Assessment & Plan:   Physical exam: Screening blood work  ordered Exercise   1-2/week at gym, uses treadmill at home Weight  ok for age Substance abuse   none   Reviewed recommended immunizations.  Flu vaccine today   Health Maintenance  Topic Date Due   TETANUS/TDAP  05/16/2016   COLONOSCOPY (Pts 45-60yrs Insurance coverage will need to be confirmed)  09/04/2020   COVID-19 Vaccine (4 - Booster for Pfizer series) 09/25/2020   INFLUENZA VACCINE  03/28/2021   Pneumonia Vaccine 74+ Years old  Completed   Hepatitis C Screening  Completed   Zoster Vaccines- Shingrix  Completed   HPV VACCINES  Aged Out     See Problem List for Assessment and Plan of chronic medical problems.

## 2021-06-21 ENCOUNTER — Encounter: Payer: Self-pay | Admitting: Internal Medicine

## 2021-06-21 ENCOUNTER — Ambulatory Visit (INDEPENDENT_AMBULATORY_CARE_PROVIDER_SITE_OTHER): Payer: Medicare HMO | Admitting: Internal Medicine

## 2021-06-21 ENCOUNTER — Other Ambulatory Visit: Payer: Self-pay

## 2021-06-21 VITALS — BP 138/70 | HR 81 | Temp 98.0°F | Ht 73.0 in | Wt 199.0 lb

## 2021-06-21 DIAGNOSIS — Z23 Encounter for immunization: Secondary | ICD-10-CM

## 2021-06-21 DIAGNOSIS — I10 Essential (primary) hypertension: Secondary | ICD-10-CM

## 2021-06-21 DIAGNOSIS — I25118 Atherosclerotic heart disease of native coronary artery with other forms of angina pectoris: Secondary | ICD-10-CM | POA: Diagnosis not present

## 2021-06-21 DIAGNOSIS — Z8572 Personal history of non-Hodgkin lymphomas: Secondary | ICD-10-CM

## 2021-06-21 DIAGNOSIS — Z0000 Encounter for general adult medical examination without abnormal findings: Secondary | ICD-10-CM

## 2021-06-21 DIAGNOSIS — Z86010 Personal history of colonic polyps: Secondary | ICD-10-CM | POA: Diagnosis not present

## 2021-06-21 DIAGNOSIS — E782 Mixed hyperlipidemia: Secondary | ICD-10-CM

## 2021-06-21 DIAGNOSIS — R7303 Prediabetes: Secondary | ICD-10-CM

## 2021-06-21 LAB — CBC WITH DIFFERENTIAL/PLATELET
Basophils Absolute: 0 10*3/uL (ref 0.0–0.1)
Basophils Relative: 1 % (ref 0.0–3.0)
Eosinophils Absolute: 0.1 10*3/uL (ref 0.0–0.7)
Eosinophils Relative: 2.1 % (ref 0.0–5.0)
HCT: 39.7 % (ref 39.0–52.0)
Hemoglobin: 13.4 g/dL (ref 13.0–17.0)
Lymphocytes Relative: 23.3 % (ref 12.0–46.0)
Lymphs Abs: 0.9 10*3/uL (ref 0.7–4.0)
MCHC: 33.6 g/dL (ref 30.0–36.0)
MCV: 87.9 fl (ref 78.0–100.0)
Monocytes Absolute: 0.5 10*3/uL (ref 0.1–1.0)
Monocytes Relative: 13.2 % — ABNORMAL HIGH (ref 3.0–12.0)
Neutro Abs: 2.4 10*3/uL (ref 1.4–7.7)
Neutrophils Relative %: 60.4 % (ref 43.0–77.0)
Platelets: 266 10*3/uL (ref 150.0–400.0)
RBC: 4.52 Mil/uL (ref 4.22–5.81)
RDW: 13.7 % (ref 11.5–15.5)
WBC: 3.9 10*3/uL — ABNORMAL LOW (ref 4.0–10.5)

## 2021-06-21 LAB — COMPREHENSIVE METABOLIC PANEL
ALT: 16 U/L (ref 0–53)
AST: 21 U/L (ref 0–37)
Albumin: 4.4 g/dL (ref 3.5–5.2)
Alkaline Phosphatase: 109 U/L (ref 39–117)
BUN: 16 mg/dL (ref 6–23)
CO2: 30 mEq/L (ref 19–32)
Calcium: 10.2 mg/dL (ref 8.4–10.5)
Chloride: 102 mEq/L (ref 96–112)
Creatinine, Ser: 1.12 mg/dL (ref 0.40–1.50)
GFR: 63.8 mL/min (ref >60.00)
Glucose, Bld: 68 mg/dL — ABNORMAL LOW (ref 70–99)
Potassium: 4.2 mEq/L (ref 3.5–5.1)
Sodium: 138 mEq/L (ref 135–145)
Total Bilirubin: 0.6 mg/dL (ref 0.2–1.2)
Total Protein: 7.7 g/dL (ref 6.0–8.3)

## 2021-06-21 LAB — HEMOGLOBIN A1C: Hgb A1c MFr Bld: 6.1 % (ref 4.6–6.5)

## 2021-06-21 LAB — TSH: TSH: 2.66 u[IU]/mL (ref 0.35–5.50)

## 2021-06-21 NOTE — Assessment & Plan Note (Signed)
Chronic Following with cardiology Has had a couple episodes of chest pain, but only needed to use nitroglycerin once Has follow-up scheduled with cardiology Continue current medications CBC, CMP, TSH

## 2021-06-21 NOTE — Assessment & Plan Note (Signed)
Chronic Check a1c Low sugar / carb diet Stressed regular exercise  

## 2021-06-21 NOTE — Assessment & Plan Note (Signed)
Following with oncology No evidence of recurrence

## 2021-06-21 NOTE — Assessment & Plan Note (Signed)
?    Due for colonoscopy-referral ordered so they can evaluate and contact patient

## 2021-06-21 NOTE — Assessment & Plan Note (Signed)
Chronic Regular exercise and healthy diet encouraged Currently in a study so we will not check his cholesterol today

## 2021-06-21 NOTE — Assessment & Plan Note (Signed)
Chronic Blood pressure controlled CMP Continue current medications

## 2021-06-22 NOTE — Addendum Note (Signed)
Addended by: Marcina Millard on: 06/22/2021 04:18 PM   Modules accepted: Orders

## 2021-06-29 ENCOUNTER — Encounter: Payer: Self-pay | Admitting: Nurse Practitioner

## 2021-06-30 ENCOUNTER — Encounter: Payer: Self-pay | Admitting: Internal Medicine

## 2021-07-12 ENCOUNTER — Other Ambulatory Visit: Payer: Self-pay | Admitting: Cardiovascular Disease

## 2021-07-19 ENCOUNTER — Encounter: Payer: Self-pay | Admitting: Nurse Practitioner

## 2021-07-19 ENCOUNTER — Ambulatory Visit (INDEPENDENT_AMBULATORY_CARE_PROVIDER_SITE_OTHER): Payer: Medicare HMO | Admitting: Nurse Practitioner

## 2021-07-19 VITALS — BP 132/82 | HR 97 | Ht 73.0 in | Wt 198.2 lb

## 2021-07-19 DIAGNOSIS — Z86010 Personal history of colonic polyps: Secondary | ICD-10-CM | POA: Diagnosis not present

## 2021-07-19 DIAGNOSIS — K5900 Constipation, unspecified: Secondary | ICD-10-CM | POA: Diagnosis not present

## 2021-07-19 DIAGNOSIS — I25118 Atherosclerotic heart disease of native coronary artery with other forms of angina pectoris: Secondary | ICD-10-CM

## 2021-07-19 NOTE — Progress Notes (Signed)
Addendum: Reviewed and agree with assessment and management plan. Given excellent bowel prep and 3 very small adenomatous colon polyps in 2019, it is reasonable based on his age and other medical issues to discontinue colonoscopy for polyp surveillance.  I would be comfortable discontinuing colonoscopy for polyp surveillance if this is the patient's wishes and he is in agreement. Of course this does not mean that we would not fully evaluate troublesome GI symptoms should they occur for him in the future.  Kamila Broda, Lajuan Lines, MD

## 2021-07-19 NOTE — Patient Instructions (Signed)
If you are age 76 or older, your body mass index should be between 23-30. Your Body mass index is 26.15 kg/m. If this is out of the aforementioned range listed, please consider follow up with your Primary Care Provider.  The [place] [facility] providers would like to encourage you to use [facility] to communicate with providers for non-urgent requests or questions.  Due to long hold times on the telephone, sending your provider a message by [facility] may be faster and more efficient way to get a response. Please allow 48 business hours for a response.  Please remember that this is for non-urgent requests/questions.  It has been recommended to you by your physician that you have a(n) Colonoscopy completed. Per your request, we did not schedule the procedure(s) today. Please contact our office at [phone] should you decide to have the procedure completed. You will be scheduled for a pre-visit and procedure at that time.  RECOMMENDATIONS: Benefiber- 1 tablespoon daily. Miralax- Dissolve one capful in 8 ounces of water and drink before bed.  It was great seeing you today! Thank you for entrusting me with your care and choosing [facility]  Noralyn Pick, CRNP

## 2021-07-19 NOTE — Progress Notes (Signed)
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 He presents to our office today as referred by Dr. Billey Gosling to schedule a colonoscopy due to a prior history of colon polyps.  He typically passes a normal formed brown bowel movement most mornings.  However, about 3 weeks ago he became constipated.  He described going 3 days without passing a BM then passed several small soft to loose stools for a week or two.  No bloody stools or melena.  He had intermittent right and left sided abdominal pain as well, no significant abdominal pain which has abated. He increased his water intake and started eating more fruit and vegetables and his bowel pattern is nearly back to normal.  For the past week, he reports passing a normal formed brown bowel movement once daily which occurs anytime during the day.  He wishes to be back to his early morning BM routine. He underwent a colonoscopy 09/04/2017 which identified 3 tubular adenomatous polyps and 1 hyperplastic polyp removed from the colon.  He was advised by Dr. Hilarie Fredrickson to repeat a colonoscopy in 3 years.  Significant history of coronary artery disease as noted above.  He is followed by Dr. Burt Knack and Richardson Dopp cardiology PA-C every 6 months.  He reports having brief twinges of mid sternal chest "soreness not pain" that occurs only if he twists from side to side during certain exercises.  He stated he has discussed this with his cardiologist and this pain was thought to be musculoskeletal in nature.  He typically rubs the chest area  and the soreness abates.  He is able to walk up 2 flights of stairs without having any chest pain, chest soreness, shortness of breath or dizziness.  He does not take NTG for this chest soreness.  He remains quite active and exercises on a regular basis.  He reported gaining 8 pounds over the past few months.  No other complaints at this time.  Colonoscopy 09/04/2017 by Dr. Hilarie Fredrickson: - Two 4 to 5 mm polyps in the ascending colon, removed with a cold snare. Resected and retrieved. - One 5 mm polyp in the transverse colon, removed with a cold snare. Resected and retrieved. - One 3 mm polyp in the transverse colon, removed with a cold snare. Resected and retrieved. - Mild diverticulosis in the sigmoid colon. - Small internal hemorrhoids. - Recall colonoscopy 3 years 1. Surgical [P], ascending, polyp (2) - TUBULAR ADENOMA (FOUR FRAGMENTS) WITHOUT HIGH GRADE DYSPLASIA OR MALIGNANCY. 2. Surgical [P], transverse, polyp (2) - TUBULAR ADENOMA (ONE FRAGMENT) WITHOUT HIGH GRADE DYSPLASIA OR MALIGNANCY. - HYPERPLASTIC POLYP (ONE FRAGMENT).  CBC Latest Ref Rng & Units 06/21/2021 08/31/2020 04/16/2020  WBC 4.0 - 10.5 K/uL 3.9(L) 3.8(L) 3.9  Hemoglobin 13.0 - 17.0 g/dL 13.4 14.1 13.2  Hematocrit 39.0 - 52.0 % 39.7 40.9 40.8  Platelets 150.0 - 400.0 K/uL 266.0 256.0 249    CMP Latest Ref Rng & Units 06/21/2021 08/31/2020 04/16/2020  Glucose 70 - 99 mg/dL 68(L) 95 87  BUN 6 - 23 mg/dL 16 16 18  Creatinine 0.40 - 1.50 mg/dL 1.12 1.11 1.22(H)  Sodium 135 - 145 mEq/L 138 138 141  Potassium 3.5 - 5.1 mEq/L 4.2 4.0 5.1  Chloride 96 - 112 mEq/L 102 100 103  CO2 19 - 32 mEq/L 30 30 30   Calcium 8.4 - 10.5 mg/dL 10.2 10.4 10.3  Total Protein 6.0 - 8.3 g/dL 7.7 7.8 7.3  Total Bilirubin 0.2 - 1.2 mg/dL 0.6 0.7 0.9  Alkaline Phos 39 - 117 U/L 109 106 -  AST 0 - 37 U/L 21 19 22   ALT 0 - 53 U/L 16 15 17     Echo 08/30/2016: - Normal LV size and systolic function, EF 17-91%. Normal RV size    and systolic function. No  significant valvular abnormalities.   Past Medical History:  Diagnosis Date   Allergy    Arthritis    CAP (community acquired pneumonia)    Cataract    bilateral   Coronary artery disease    03/13/2016 DES to first OM, EF 55% // Myoview 12/19: EF 55, ant-lat infarction, no significant ischemia, Low Risk   Heart murmur    "outgrew it" (03/13/2016)   Hematuria    HTN (hypertension)    Hx of cardiovascular stress test    ETT-Myoview (10/15): Nondiagnostic EKG changes, anterolateral and inferolateral scar, no ischemia, EF 48%, low risk   Hyperlipemia    Hyperlipidemia    NMR Lipoprofile 2008: LDL 168 ( 2410/ 1826), HDL 41, TG 71. LDL goal = <100, ideally <  70. Framingham Study LDL goal = < 130.   Kidney stones    "passed them"; Dr. Terance Hart (03/13/2016)   Myocardial infarction [facility]) 2001   [facility] Hod[facility]   Pleural effusion    TIA (transient ischemic attack) 1980s   "I was exercising when I had it"   Past Surgical History:  Procedure Laterality Date   CARDIAC CATHETERIZATION N/A 12/22/2015   Procedure: Left Heart Cath and Coronary Angiography;  Surgeon: Sherren Mocha, MD;  Location: [place] [facility];  Service: Cardiovascular;  Laterality: N/A;   CARDIAC CATHETERIZATION N/A 12/22/2015   Procedure: Coronary Stent Intervention;  Surgeon: Sherren Mocha, MD;  Location: [place] [facility];  Service: Cardiovascular;  Laterality: N/A;   CARDIAC CATHETERIZATION N/A 12/22/2015   Procedure: Intravascular Pressure Wire/FFR Study;  Surgeon: Sherren Mocha, MD;  Location: [place] [facility];  Service: Cardiovascular;  Laterality: N/A;   CARDIAC CATHETERIZATION N/A 03/13/2016   Procedure: Left Heart Cath and Coronary Angiography;  Surgeon: Sherren Mocha, MD;  Location: [place] [facility];  Service: Cardiovascular;  Laterality: N/A;   COLONOSCOPY  2002   CORONARY ANGIOPLASTY WITH STENT PLACEMENT  ~ 2013   "1 stent"   ESOPHAGOGASTRODUODENOSCOPY (EGD) WITH  ESOPHAGEAL DILATION  2002   KNEE ARTHROSCOPY Left 1990s   Social History: He is married.  He is retired.  Non-smoker.  No alcohol use.  No drug use.  Family History: Mother breast cancer. Mother had diabetes. Half sister had breast cancer. Father had prostate cancer and a stroke.  No known family history of esophageal, gastric or colon cancer.  Allergies  Allergen Reactions   Zetia [Ezetimibe] Shortness Of Breath and Other (See Comments)    Chest pain   Crestor [Rosuvastatin] Other (See Comments)    Myalgias   Isosorbide Mononitrate [Isosorbide Nitrate] Other (See Comments)    Headache, throat soreness and hoarse   Pravachol [Pravastatin Sodium] Other (See Comments)    Myalgias  Silver Rash     Outpatient Encounter Medications as of 07/19/2021  Medication Sig   Alcohol Swabs (B-D SINGLE USE SWABS REGULAR) PADS 1 each by Does not apply route as needed.   amLODipine (NORVASC) 10 MG tablet Take 1 tablet (10 mg total) by mouth daily.   aspirin 81 MG tablet Take 1 tablet (81 mg total) by mouth daily.   clopidogrel (PLAVIX) 75 MG tablet Take 1 tablet (75 mg total) by mouth daily. TAKE 1 TABLET EVERY DAY   dorzolamide-timolol (COSOPT) 22.3-6.8 MG/ML ophthalmic solution Place 1 drop into both eyes 2 (two) times daily.    finasteride (PROSCAR) 5 MG tablet TAKE 1 TABLET EVERY DAY   latanoprost (XALATAN) 0.005 % ophthalmic solution Place 1 drop into both eyes at bedtime.   nitroGLYCERIN (NITROSTAT) 0.4 MG SL tablet PLACE 1 TABLET UNDER THE TONGUE AS NEEDED FOR CHEST PAIN   Study - ORION 4 - inclisiran 300 mg/1.85mL or placebo SQ injection (PI-Stuckey) Inject 300 mg into the skin every 6 (six) months.   No facility-administered encounter medications on file as of 07/19/2021.   REVIEW OF SYSTEMS:  Gen: Denies fever, sweats or chills. + Weight gain. CV: Denies chest pain, palpitations or edema. Resp: Denies cough, shortness of breath of hemoptysis.  GI: See HPI.  No dysphagia or heartburn.    GU : Denies urinary burning, blood in urine, increased urinary frequency or incontinence. MS: + Lower back pain.  Derm: Denies rash, itchiness, skin lesions or unhealing ulcers. Psych: Denies depression, anxiety or memory loss. Heme: Denies bruising, bleeding. Neuro:  Denies headaches, dizziness or paresthesias. Endo:  Denies any problems with DM, thyroid or adrenal function.  PHYSICAL EXAM: BP 132/82   Pulse 97   Ht 6\' 1"  (1.854 m)   Wt 198 lb 3.2 oz (89.9 kg)   SpO2 98%   BMI 26.15 kg/m   Wt Readings from Last 3 Encounters:  07/19/21 198 lb 3.2 oz (89.9 kg)  06/21/21 199 lb (90.3 kg)  03/15/21 200 lb 12.8 oz (91.1 kg)    General: 75 year old male in no acute distress. Head: Normocephalic and atraumatic. Eyes:  Sclerae non-icteric, conjunctive pink. Ears: Normal auditory acuity. Mouth: Dentition intact. No ulcers or lesions.  Neck: Supple, no lymphadenopathy or thyromegaly.  Lungs: Clear bilaterally to auscultation without wheezes, crackles or rhonchi. Heart: Regular rate and rhythm. No murmur, rub or gallop appreciated.  Abdomen: Soft, nontender, non distended. No masses. No hepatosplenomegaly. Normoactive bowel sounds x 4 quadrants.  Rectal: Deferred. Musculoskeletal: Symmetrical with no gross deformities. Skin: Warm and dry. No rash or lesions on visible extremities. Extremities: No edema. Neurological: Alert oriented x 4, no focal deficits.  Psychological:  Alert and cooperative. Normal mood and affect.  ASSESSMENT AND PLAN:  43) 75  year old male with a change in bowel pattern with associated right and left sided abdominal pain and constipation which resolved after he increased his water and fruit/vegetable intake.  No fevers.  -Benefiber 1 tablespoon daily as tolerated -MiraLAX nightly as needed, avoid straining -I discussed scheduling CTAP if his abdominal pain or significant constipation recurred -See plan in # 2  2) History of 3 tubular adenomatous and 1  hyperplastic colon polyp per colonoscopy 08/2017 -Colonoscopy benefits and risks discussed including risk with sedation, risk of bleeding, perforation and infection.  Mr. Carducci wishes to take a bit more time to consider scheduling another colonoscopy.  He will contact our office when he is ready to schedule this procedure.  He  would appreciate Dr. Vena Rua input regarding his recommendations for a colonoscopy at the age of 66. -I will contact the patient with Dr. Vena Rua recommendations - Patient to proceed with his 82-month cardiology follow-up 08/2021 prior to proceeding with a colonoscopy -The patient elects to proceed with a colonoscopy our office will contact his cardiologist to verify Plavix instructions prior to proceeding with this procedure -See plan in #3  3) History of CAD Ss/p metal stent 2012, s/p DES x 2 in 2017 on ASA and Plavix. Residual small vessel disease and distal vessel disease treated medically.  He has intermittent central chest wall pain which occurs only when twisting his torso during certain exercises and does not occur with exertion such as when climbing up 2 flights of stairs.  This chest soreness abates after he rubs the area for a few seconds. -Proceed with cardiology follow-up 08/2021  4) History of non-Hodgkin's lymphoma, in remission.  Stable WBC 3.9.     CC:  Binnie Rail, MD

## 2021-07-25 NOTE — Progress Notes (Signed)
Dr. Hilarie Fredrickson, Juluis Rainier I discussed your recommendations with Austin [name].  He would like to monitor his constipation symptoms over the next few weeks and he is considering pursuing 1 last colonoscopy.  He will call me in 2 weeks with an update and we will finalize his decision regarding scheduling a colonoscopy or not.  In the interim, he knows to call me sooner if he has worse abdominal pain and I will order CTAP for further evaluation.  I will forward any update to you when received.

## 2021-08-11 ENCOUNTER — Other Ambulatory Visit: Payer: Self-pay

## 2021-08-11 DIAGNOSIS — K5900 Constipation, unspecified: Secondary | ICD-10-CM

## 2021-08-11 DIAGNOSIS — R1084 Generalized abdominal pain: Secondary | ICD-10-CM

## 2021-08-11 NOTE — Telephone Encounter (Signed)
Okay to proceed with CT scan abdomen pelvis with contrast for abdominal pain Ensure that he has had a BUN and creatinine within 30 days JMP

## 2021-08-12 ENCOUNTER — Telehealth: Payer: Medicare HMO

## 2021-08-12 ENCOUNTER — Other Ambulatory Visit (INDEPENDENT_AMBULATORY_CARE_PROVIDER_SITE_OTHER): Payer: Medicare HMO

## 2021-08-12 DIAGNOSIS — K5900 Constipation, unspecified: Secondary | ICD-10-CM | POA: Diagnosis not present

## 2021-08-12 DIAGNOSIS — R1084 Generalized abdominal pain: Secondary | ICD-10-CM

## 2021-08-12 LAB — BUN: BUN: 17 mg/dL (ref 6–23)

## 2021-08-12 LAB — CREATININE, SERUM: Creatinine, Ser: 1.28 mg/dL (ref 0.40–1.50)

## 2021-08-15 ENCOUNTER — Encounter (HOSPITAL_COMMUNITY): Payer: Self-pay

## 2021-08-15 ENCOUNTER — Ambulatory Visit (HOSPITAL_COMMUNITY)
Admission: RE | Admit: 2021-08-15 | Discharge: 2021-08-15 | Disposition: A | Discharge status: Home / Self Care | Payer: Medicare HMO | Source: Ambulatory Visit | Attending: Internal Medicine | Admitting: Internal Medicine

## 2021-08-15 ENCOUNTER — Other Ambulatory Visit: Payer: Self-pay

## 2021-08-15 DIAGNOSIS — R109 Unspecified abdominal pain: Secondary | ICD-10-CM | POA: Diagnosis not present

## 2021-08-15 DIAGNOSIS — K5900 Constipation, unspecified: Secondary | ICD-10-CM

## 2021-08-15 DIAGNOSIS — R1084 Generalized abdominal pain: Secondary | ICD-10-CM

## 2021-08-15 MED ORDER — IOHEXOL 350 MG/ML SOLN
75.0000 mL | Freq: Once | INTRAVENOUS | Status: AC | PRN
  Administered 2021-08-15: 15:00:00 75 mL via INTRAVENOUS

## 2021-08-15 MED ORDER — SODIUM CHLORIDE (PF) 0.9 % IJ SOLN
INTRAMUSCULAR | Status: AC
  Filled 2021-08-15: qty 50

## 2021-08-17 ENCOUNTER — Encounter: Payer: Self-pay | Admitting: Internal Medicine

## 2021-08-17 DIAGNOSIS — I700 Atherosclerosis of aorta: Secondary | ICD-10-CM | POA: Insufficient documentation

## 2021-08-18 ENCOUNTER — Encounter: Payer: Self-pay | Admitting: Internal Medicine

## 2021-09-05 ENCOUNTER — Encounter: Payer: Self-pay | Admitting: Internal Medicine

## 2021-09-05 ENCOUNTER — Other Ambulatory Visit: Payer: Self-pay | Admitting: Cardiovascular Disease

## 2021-09-05 NOTE — Progress Notes (Signed)
Subjective:    Patient ID: Austin [name], male    DOB: May 20, 1946, 76 y.o.   MRN: [id number]  This visit occurred during the SARS-CoV-2 public health emergency.  Safety protocols were in place, including screening questions prior to the visit, additional usage of staff PPE, and extensive cleaning of exam room while observing appropriate contact time as indicated for disinfecting solutions.    HPI The patient is here for an acute visit.  He had a recent ct scan - enlarged prostate was noted.  He has known BPH and last saw urology 09/2016.  PSA has been elevated in the past.  CT scan - Markedly enlarged prostate, and findings of chronic bladder outlet obstruction.  He is taking Proscar daily.  He wonders about adding a supplement to that.  He had concerns regarding the cysts seen liver and kidneys.   Medications and allergies reviewed with patient and updated if appropriate.  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 3 each 3   amLODipine (NORVASC) 10 MG tablet Take 1 tablet (10 mg total) by mouth daily. 90 tablet 3   aspirin 81 MG tablet Take 1 tablet (81 mg total) by mouth daily. 30 tablet    clopidogrel (PLAVIX) 75 MG tablet Take 1 tablet (75 mg total) by mouth daily. Pt needs to keep upcoming appt in Mar for further  refills 90 tablet 1   dorzolamide-timolol (COSOPT) 22.3-6.8 MG/ML ophthalmic solution Place 1 drop into both eyes 2 (two) times daily.      finasteride (PROSCAR) 5 MG tablet TAKE 1 TABLET EVERY DAY 90 tablet 2   latanoprost (XALATAN) 0.005 % ophthalmic solution Place 1 drop into both eyes at bedtime.     nitroGLYCERIN (NITROSTAT) 0.4 MG SL tablet PLACE 1 TABLET UNDER THE TONGUE AS NEEDED FOR CHEST PAIN 25 tablet 9   Study - ORION 4 - inclisiran 300 mg/1.60mL or placebo SQ injection (PI-Stuckey) Inject 300 mg into the skin every 6 (six) months.     No current facility-administered medications on file prior to visit.    Past Medical History:  Diagnosis Date   Allergy    Arthritis    CAP (community acquired pneumonia)    Cataract    bilateral   Coronary artery disease    03/13/2016 DES to first OM, EF 55% // Myoview 12/19: EF 55, ant-lat infarction, no significant ischemia, Low Risk   Heart murmur    "outgrew it" (03/13/2016)   Hematuria    HTN (hypertension)    Hx of cardiovascular stress test    ETT-Myoview (10/15): Nondiagnostic EKG changes, anterolateral and inferolateral scar, no ischemia, EF 48%, low risk   Hyperlipemia    Hyperlipidemia    NMR Lipoprofile 2008: LDL 168 ( 2410/ 1826), HDL 41, TG 71. LDL goal = <100, ideally <  70. Framingham  Study LDL goal = < 130.   Kidney stones    "passed them"; Dr. Terance Hart (03/13/2016)   [facility]ocardial [facility]) [facility]   Non Hodgkin's lymphoma [facility]   Pleural effusion    TIA (transient ischemic attack) 1980s   "I was exercising when I had it"    Past Surgical History:  Procedure Laterality Date   CARDIAC CATHETERIZATION N/A 12/22/2015   Procedure: Left Heart Cath and Coronary Angiography;  Surgeon: Sherren Mocha, MD;  Location: [place] [facility];  Service: Cardiovascular;  Laterality: N/A;   CARDIAC CATHETERIZATION N/A 12/22/2015   Procedure: Coronary Stent Intervention;  Surgeon: Sherren Mocha, MD;  Location: [place] [facility];  Service: Cardiovascular;  Laterality: N/A;   CARDIAC CATHETERIZATION N/A 12/22/2015   Procedure: Intravascular Pressure Wire/FFR Study;  Surgeon: Sherren Mocha, MD;  Location: [place] [facility];  Service: Cardiovascular;  Laterality: N/A;   CARDIAC CATHETERIZATION N/A 03/13/2016   Procedure: Left Heart Cath and Coronary Angiography;  Surgeon: Sherren Mocha, MD;  Location: [place] [facility];  Service: Cardiovascular;  Laterality: N/A;   COLONOSCOPY  2002   CORONARY ANGIOPLASTY WITH STENT PLACEMENT  ~ 2013   "1 stent"   ESOPHAGOGASTRODUODENOSCOPY (EGD) WITH ESOPHAGEAL DILATION  2002   KNEE ARTHROSCOPY Left 1990s    Social History   Socioeconomic History   Marital status: Married    Spouse name: Not on file   Number of children: 3   Years of education: Not on file   Highest education level: Not on file  Occupational History   Occupation: retired Higher education careers adviser  Tobacco Use   Smoking status: Never   Smokeless tobacco: Never  Vaping Use   Vaping Use: Never used  Substance and Sexual Activity   Alcohol use: No    Comment: drank some in my 20's"   Drug use: No   Sexual activity: Yes  Other Topics Concern   Not on file  Social History Narrative   Not on file   Social Determinants of Health   Financial Resource Strain: Low Risk    Difficulty of Paying Living Expenses: Not hard at all  Food Insecurity: No Food Insecurity   Worried About Charity fundraiser in the Last Year: Never true   Brethren in the Last Year: Never true  Transportation Needs: No Transportation Needs   Lack of Transportation (Medical): No   Lack of Transportation (Non-Medical): No  Physical Activity: Sufficiently Active   Days of Exercise per Week: 5 days   Minutes of Exercise per Session: 30 min  Stress: No Stress Concern Present   Feeling of Stress : Not at all  Social Connections: Socially Integrated   Frequency of Communication with Friends and Family: More than three times a  week   Frequency of Social Gatherings with Friends and Family: Once a week   Attends Religious Services: More than 4 times per year   Active Member of [employer] or Organizations: Yes   Attends Music therapist: More than 4 times per year   Marital Status: Married    Family History  Problem Relation Age of Onset   Cancer Mother        lung cancer   Diabetes Mother    Hyperlipidemia Mother    Stroke Father        age 66   Prostate cancer Father    Cancer Sister        ?lymphoma  Review of Systems     Objective:   Vitals:   09/06/21 0824  BP: 140/82  Pulse: 72  Temp: 98.5 F (36.9 C)  SpO2: 98%   BP Readings from Last 3 Encounters:  09/06/21 140/82  07/19/21 132/82  06/21/21 138/70   Wt Readings from Last 3 Encounters:  09/06/21 200 lb (90.7 kg)  07/19/21 198 lb 3.2 oz (89.9 kg)  06/21/21 199 lb (90.3 kg)   Body mass index is 26.39 kg/m.   Physical Exam        CT ABDOMEN PELVIS W CONTRAST CLINICAL DATA:  Generalized abdominal pain for 2 weeks. Change in bowel habits. Personal history of non-Hodgkin lymphoma.  EXAM: CT ABDOMEN AND PELVIS WITH CONTRAST  TECHNIQUE: Multidetector CT imaging of the abdomen and pelvis was performed using the standard protocol following bolus administration of intravenous contrast.  CONTRAST:  2mL OMNIPAQUE IOHEXOL 350 MG/ML SOLN  COMPARISON:  None.  FINDINGS: Lower Chest: No acute findings.  Hepatobiliary: No hepatic masses identified. Small cyst noted in the right hepatic lobe. Gallbladder is unremarkable. No evidence of biliary ductal dilatation.  Pancreas:  No mass or inflammatory changes.  Spleen: Within normal limits in size and appearance.  Adrenals/Urinary Tract: No masses identified. A few small renal cysts are noted. No evidence of ureteral calculi or hydronephrosis. Diffuse bladder wall thickening and small left-sided bladder diverticulum are seen, likely due to chronic bladder  outlet obstruction given enlarged prostate.  Stomach/Bowel: No evidence of obstruction, inflammatory process or abnormal fluid collections. Normal appendix visualized.  Vascular/Lymphatic: No pathologically enlarged lymph nodes. Ill-defined plaque-like soft tissue density in the root of the small bowel mesentery is consistent with treated lymphoma. No acute vascular findings. Aortic atherosclerotic calcification noted.  Reproductive: Markedly enlarged prostate gland with mass effect on bladder base.  Other:  Small bilateral inguinal hernias, both containing only fat.  Musculoskeletal:  No suspicious bone lesions identified.  IMPRESSION: No acute findings within the abdomen or pelvis.  Markedly enlarged prostate, and findings of chronic bladder outlet obstruction.  Small bilateral inguinal hernias, both containing only fat.  Aortic Atherosclerosis (ICD10-I70.0).  Electronically Signed   By: Marlaine Hind M.D.   On: 08/15/2021 17:00    Assessment & Plan:    See Problem List for Assessment and Plan of chronic medical problems.

## 2021-09-06 ENCOUNTER — Ambulatory Visit (INDEPENDENT_AMBULATORY_CARE_PROVIDER_SITE_OTHER): Payer: Medicare HMO | Admitting: Internal Medicine

## 2021-09-06 ENCOUNTER — Encounter: Payer: Self-pay | Admitting: Internal Medicine

## 2021-09-06 ENCOUNTER — Other Ambulatory Visit: Payer: Self-pay | Admitting: Cardiovascular Disease

## 2021-09-06 ENCOUNTER — Other Ambulatory Visit: Payer: Self-pay

## 2021-09-06 VITALS — BP 140/82 | HR 72 | Temp 98.5°F | Ht 73.0 in | Wt 200.0 lb

## 2021-09-06 DIAGNOSIS — K4020 Bilateral inguinal hernia, without obstruction or gangrene, not specified as recurrent: Secondary | ICD-10-CM

## 2021-09-06 DIAGNOSIS — N400 Enlarged prostate without lower urinary tract symptoms: Secondary | ICD-10-CM | POA: Diagnosis not present

## 2021-09-06 DIAGNOSIS — K7689 Other specified diseases of liver: Secondary | ICD-10-CM | POA: Diagnosis not present

## 2021-09-06 NOTE — Assessment & Plan Note (Signed)
Chronic With bladder outlet obstruction Shows chronic bladder changes related to obstruction on CT scan Continue Proscar 5 mg daily He will call urology and set up a follow-up appointment-looks like he last saw them around 2018 Discussed concerns with obstruction of the bladder and increased risk of infections

## 2021-09-06 NOTE — Assessment & Plan Note (Signed)
Seen on Ct scan  Asymptomatic Advised to monitor for now

## 2021-09-06 NOTE — Assessment & Plan Note (Signed)
New Seen on recent CT scan-benign Also with cysts on kidneys Reassured

## 2021-09-06 NOTE — Patient Instructions (Addendum)
Follow up with urology

## 2021-09-12 NOTE — Telephone Encounter (Signed)
See other mychart message.

## 2021-09-14 ENCOUNTER — Telehealth: Payer: Medicare HMO

## 2021-09-14 NOTE — Progress Notes (Deleted)
Chronic Care Management Pharmacy Note  09/14/2021 Name:  Austin [name] MRN:  [id number] DOB:  1945/09/22  Summary: -Pt is due for colonoscopy this year -Pt is enrolled in [place]) and will be eligible to get Leqvio for free after trial is over  Recommendations/Changes made from today's visit: -Advised pt to make 48-monthf/u appt with PCP   Subjective: FCAEDIN MOGANis an 76y.o. year old male year old male who is a primary patient of Burns, [name].  The CCM team was consulted for assistance with disease management and care coordination needs.    Engaged with patient by telephone for follow up visit in response to provider referral for pharmacy case management and/or care coordination services.   Consent to Services:  The patient was given information about Chronic Care Management services, agreed to services, and gave verbal consent prior to initiation of services.  Please see initial visit note for detailed documentation.   Patient Care Team: [name] as PCP - General (Internal Medicine) [name] as PCP - Cardiology (Cardiology) [name] (Pharmacist) [name] as Consulting Physician (Ophthalmology) [name] (Cardiology)  Recent office visits: 09/06/2021 - Dr. 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 [name]- no changes to medications   Recent consult visits: 07/19/2021 - [name] - GGertie Fey- benefiber daily, miralax before bedtime   Hospital visits: None in previous 6 months   Objective:  Lab Results  Component Value Date   CREATININE 1.28 08/12/2021   BUN 17 08/12/2021   GFR 63.80 06/21/2021   GFRNONAA >60 04/17/2019   GFRAA >60 04/17/2019   NA 138 06/21/2021   K 4.2 06/21/2021   CALCIUM 10.2 06/21/2021   CO2 30 06/21/2021   GLUCOSE 68 (L)  06/21/2021    Lab Results  Component Value Date/Time   HGBA1C 6.1 06/21/2021 09:21 AM   HGBA1C 5.9 08/31/2020 08:13 AM   GFR 63.80 06/21/2021 09:21 AM   GFR 64.86 08/31/2020 08:13 AM    Last diabetic Eye exam: No results found for: HMDIABEYEEXA  Last diabetic Foot exam: No results found for: HMDIABFOOTEX   Lab Results  Component Value Date   CHOL 221 (H) 04/16/2020   HDL 49 04/16/2020   LDLCALC 150 (H) 04/16/2020   LDLDIRECT 173.2 07/21/2013   TRIG 108 04/16/2020   CHOLHDL 4.5 04/16/2020    Hepatic Function Latest Ref Rng & Units 06/21/2021 08/31/2020 04/16/2020  Total Protein 6.0 - 8.3 g/dL 7.7 7.8 7.3  Albumin 3.5 - 5.2 g/dL 4.4 4.6 -  AST 0 - 37 U/L '21 19 22  ' ALT 0 - 53 U/L '16 15 17  ' Alk Phosphatase 39 - 117 U/L 109 106 -  Total Bilirubin 0.2 - 1.2 mg/dL 0.6 0.7 0.9  Bilirubin, Direct <=0.2 mg/dL - - -    Lab Results  Component Value Date/Time   TSH 2.66 06/21/2021 09:21 AM   TSH 4.40 08/31/2020 08:13 AM    CBC Latest Ref Rng & Units 06/21/2021 08/31/2020 04/16/2020  WBC 4.0 - 10.5 K/uL 3.9(L) 3.8(L) 3.9  Hemoglobin 13.0 - 17.0 g/dL 13.4 14.1 13.2  Hematocrit 39.0 - 52.0 % 39.7 40.9 40.8  Platelets 150.0 - 400.0 K/uL 266.0 256.0 249    No results found for: VD25OH  Clinical ASCVD: Yes  The  10-year ASCVD risk score (Arnett DK, et al., 2019) is: 27.2%   Values used to calculate the score:     Age: 76 years     Sex: Male     Is Non-Hispanic African American: Yes     Diabetic: No     Tobacco smoker: No     Systolic Blood Pressure: 638 mmHg     Is BP treated: Yes     HDL Cholesterol: 49 mg/dL     Total Cholesterol: 221 mg/dL    Depression screen [facility] 2/9 09/17/2020 04/16/2020 03/10/2019  Decreased Interest 0 - 0  Down, Depressed, Hopeless 0 0 0  PHQ - 2 Score 0 0 0  Altered sleeping - - -  Tired, decreased energy - - -  Change in appetite - - -  Feeling bad or failure about yourself  - - -  Trouble concentrating - - -  Moving slowly or fidgety/restless - - -   Suicidal thoughts - - -  PHQ-9 Score - - -  Difficult doing work/chores - - -  Some recent data might be hidden     Social History   Tobacco Use  Smoking Status Never  Smokeless Tobacco Never   BP Readings from Last 3 Encounters:  09/06/21 140/82  07/19/21 132/82  06/21/21 138/70   Pulse Readings from Last 3 Encounters:  09/06/21 72  07/19/21 97  06/21/21 81   Wt Readings from Last 3 Encounters:  09/06/21 200 lb (90.7 kg)  07/19/21 198 lb 3.2 oz (89.9 kg)  06/21/21 199 lb (90.3 kg)   BMI Readings from Last 3 Encounters:  09/06/21 26.39 kg/m  07/19/21 26.15 kg/m  06/21/21 26.25 kg/m    Assessment/Interventions: Review of patient past medical history, allergies, medications, health status, including review of consultants reports, laboratory and other test data, was performed as part of comprehensive evaluation and provision of chronic care management services.   SDOH:  (Social Determinants of Health) assessments and interventions performed: Yes  SDOH Screenings   Alcohol Screen: Low Risk    Last Alcohol Screening Score (AUDIT): 0  Depression (PHQ2-9): Low Risk    PHQ-2 Score: 0  Financial Resource Strain: Low Risk    Difficulty of Paying Living Expenses: Not hard at all  Food Insecurity: No Food Insecurity   Worried About Charity fundraiser in the Last Year: Never true   Ran Out of Food in the Last Year: Never true  Housing: Low Risk    Last Housing Risk Score: 0  Physical Activity: Sufficiently Active   Days of Exercise per Week: 5 days   Minutes of Exercise per Session: 30 min  Social Connections: Engineer, building services of Communication with Friends and Family: More than three times a week   Frequency of Social Gatherings with Friends and Family: Once a week   Attends Religious Services: More than 4 times per year   Active Member of [employer] or Organizations: Yes   Attends Music therapist: More than 4 times per year   Marital  Status: Married  Stress: No Stress Concern Present   Feeling of Stress : Not at all  Tobacco Use: Low Risk    Smoking Tobacco Use: Never   Smokeless Tobacco Use: Never   Passive Exposure: Not on file  Transportation Needs: No Transportation Needs   Lack of Transportation (Medical): No   Lack of Transportation (Non-Medical): No    CCM Care Plan  Allergies  Allergen Reactions  Zetia [Ezetimibe] Shortness Of Breath and Other (See Comments)    Chest pain   Crestor [Rosuvastatin] Other (See Comments)    Myalgias   Isosorbide Mononitrate [Isosorbide Nitrate] Other (See Comments)    Headache, throat soreness and hoarse   Pravachol [Pravastatin Sodium] Other (See Comments)    Myalgias   Silver Rash    Medications Reviewed Today     Reviewed by Debera Lat, CMA (Certified Medical Assistant) on 09/06/21 at [phone]  Med List Status: <None>   Medication Order Taking? Sig Documenting Provider Last Dose Status Informant  Alcohol Swabs (B-D SINGLE USE SWABS REGULAR) PADS [id number]  1 each by Does not apply route as needed. Binnie Rail, MD  Active   amLODipine (NORVASC) 10 MG tablet [id number]  Take 1 tablet (10 mg total) by mouth daily. Sherren Mocha, MD  Active   aspirin 81 MG tablet [id number]  Take 1 tablet (81 mg total) by mouth daily. Leanor Kail, PA  Active Self  clopidogrel (PLAVIX) 75 MG tablet [id number]  Take 1 tablet (75 mg total) by mouth daily. Pt needs to keep upcoming appt in Mar for further refills Sherren Mocha, MD  Active   dorzolamide-timolol (COSOPT) 22.3-6.8 MG/ML ophthalmic solution [id number]  Place 1 drop into both eyes 2 (two) times daily.  [provider]  Active Self  finasteride (PROSCAR) 5 MG tablet [id number]  TAKE 1 TABLET EVERY DAY Sherren Mocha, MD  Active   latanoprost (XALATAN) 0.005 % ophthalmic solution [id number]  Place 1 drop into both eyes at bedtime. [provider]  Active   nitroGLYCERIN (NITROSTAT) 0.4 MG SL  tablet [id number]  PLACE 1 TABLET UNDER THE TONGUE AS NEEDED FOR CHEST PAIN Sherren Mocha, MD  Active   Study - ORION 4 - inclisiran 300 mg/1.61m or placebo SQ injection (PI-Stuckey) [id number] Inject 300 mg into the skin every 6 (six) months. 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/06/2021   Atherosclerosis of aorta (HBacon 08/17/2021   History of colon polyps 06/21/2021   Hypertensive retinopathy of both eyes 10/16/2020   Syncope    Prediabetes 08/28/2016   CAD (coronary artery disease) 12/13/2010   Hyperlipidemia 11/12/2010   Allergic rhinitis 11/12/2010   Hyperplasia of prostate 12/15/2009   Essential hypertension 11/26/2008   NEPHROLITHIASIS, HX OF 06/26/2007   History of follicular lymphoma 098/92/1194   Immunization History  Administered Date(s) Administered   Fluad Quad(high Dose 65+) 06/07/2019, 06/16/2020, 06/21/2021   Influenza, High Dose Seasonal PF 06/24/2018   Influenza-Unspecified 08/23/2017, 07/04/2018   PFIZER(Purple Top)SARS-COV-2 Vaccination 10/02/2019, 10/23/2019, 07/31/2020   Pneumococcal Conjugate-13 12/05/2017   Pneumococcal Polysaccharide-23 08/28/2010   Td 05/16/2006   Zoster Recombinat (Shingrix) 07/01/2019, 11/15/2019    Conditions to be addressed/monitored:  Hypertension, Hyperlipidemia, and Coronary Artery Disease  There are no care plans that you recently modified to display for this patient.     Medication Assistance: None required.  Patient affirms current coverage meets needs.  Compliance/Adherence/Medication fill history: Care Gaps: Covid booster (due 10/29/20) Colonoscopy (due 09/04/20)  Star-Rating Drugs: None  [facility]  [id number] [place] [address]Phone: [phone] Fax: [phone] [place]  [place] [address] Phone: [phone] Fax: [phone]   Uses pill box? Yes Pt endorses 100% compliance  We discussed: Current pharmacy is preferred with insurance plan and patient is satisfied with pharmacy services Patient decided to: Continue current medication management strategy  Care Plan and Follow Up Patient Decision:  Patient agrees to Care Plan and Follow-up.  Plan: Telephone follow up appointment with care management team member scheduled for:  6 months  ***

## 2021-09-15 ENCOUNTER — Telehealth: Payer: Self-pay | Admitting: *Deleted

## 2021-09-15 NOTE — Telephone Encounter (Signed)
I spoke with the patient regarding the procedure, unfortunately the steps of the procedure is not clear at this point.  Patient was not aware that he required 2 extraction or 2 fillings.  He was under the impression that there is only 1 teeth that need to be worked on including 1 extraction and 1 filling.  There is also a question whether or not he need bone grafting as well.  He says he does not wish to get all the procedure done at the same time.  If so, he may not need to stop the aspirin and Plavix at all.  Otherwise he denies any recent chest pain or worsening dyspnea, he is cleared from a medical perspective to proceed after steps of the procedure has been clarified.  He does not need SBE prophylaxis.

## 2021-09-15 NOTE — Telephone Encounter (Signed)
Dental office emailed over what seems to be a Tx plan for the pt. This email was sent my work email by our medical records dept as it pertains to needing pre op clearance. I s/w the dental office after reviewing what was sent to me. Brianna with the dental office stated they sent over the pt's Tx plan. I explained if there is a multi Tx plan we cannot provide a blanket type clearance. Explained that we can review for procedure to be done at this time, then when ready for the next steps they will need to send over a new clearance request for that Tx plan at that time. [facility] did say they are working on getting a fax machine as well, which will make it easier to get a clearance to the requesting office.  HERE ARE THE GUIDELINES THAT WE WILL NEED WHEN DR. Valere Dross IS READY FOR THE NEXT Tx PLAN   Pre-operative Risk Assessment    Patient Name: Austin [name]  DOB: July 01, 1946 MRN: [id number]     Request for Surgical Clearance    Procedure:   2 TEETH BEING EXTRACTED, 2 FILLINGS AND BONE GRAFT  Date of Surgery:  Clearance 10/05/21                                 Surgeon:  DR. Lynnda Child, DDS Surgeon's Group or Practice Name:  [facility]  Phone number:  [phone] Fax number:  NO FAX NEED TO EMAIL CLEARANCE TO: [email or web address]   Type of Clearance Requested:   - Medical  - Pharmacy:  Hold Aspirin and Clopidogrel (Plavix)     Type of Anesthesia:   LIDOCAINE, SEPTA CAINE,  MARCAINE    Additional requests/questions:    [name]   09/15/2021, 9:33 AM

## 2021-09-16 NOTE — Telephone Encounter (Signed)
Printed out clearance and sent to email addressed below, as requested.

## 2021-10-10 ENCOUNTER — Ambulatory Visit (INDEPENDENT_AMBULATORY_CARE_PROVIDER_SITE_OTHER): Payer: Medicare HMO | Admitting: *Deleted

## 2021-10-10 DIAGNOSIS — Z0000 Encounter for general adult medical examination without abnormal findings: Secondary | ICD-10-CM

## 2021-10-10 NOTE — Patient Instructions (Signed)
Mr. Austin [name] , Thank you for taking time to come for your Medicare Wellness Visit. I appreciate your ongoing commitment to your health goals. Please review the following plan we discussed and let me know if I can assist you in the future.   Screening recommendations/referrals: Colonoscopy: up to date/  no longer required Recommended yearly ophthalmology/optometry visit for glaucoma screening and checkup Recommended yearly dental visit for hygiene and checkup  Vaccinations: Influenza vaccine: up to date Pneumococcal vaccine: up to date Tdap vaccine: Education provided Shingles vaccine: up to date    Advanced directives: not on file  Conditions/risks identified:     Preventive Care 77 Years and Older, Male Preventive care refers to lifestyle choices and visits with your health care provider that can promote health and wellness. What does preventive care include? A yearly physical exam. This is also called an annual well check. Dental exams once or twice a year. Routine eye exams. Ask your health care provider how often you should have your eyes checked. Personal lifestyle choices, including: Daily care of your teeth and gums. Regular physical activity. Eating a healthy diet. Avoiding tobacco and drug use. Limiting alcohol use. Practicing safe sex. Taking low doses of aspirin every day. Taking vitamin and mineral supplements as recommended by your health care provider. What happens during an annual well check? The services and screenings done by your health care provider during your annual well check will depend on your age, overall health, lifestyle risk factors, and family history of disease. Counseling  Your health care provider may ask you questions about your: Alcohol use. Tobacco use. Drug use. Emotional well-being. Home and relationship well-being. Sexual activity. Eating habits. History of falls. Memory and ability to understand (cognition). Work and work  Statistician. Screening  You may have the following tests or measurements: Height, weight, and BMI. Blood pressure. Lipid and cholesterol levels. These may be checked every 5 years, or more frequently if you are over 64 years old. Skin check. Lung cancer screening. You may have this screening every year starting at age 6 if you have a 30-pack-year history of smoking and currently smoke or have quit within the past 15 years. Fecal occult blood test (FOBT) of the stool. You may have this test every year starting at age 56. Flexible sigmoidoscopy or colonoscopy. You may have a sigmoidoscopy every 5 years or a colonoscopy every 10 years starting at age 29. Prostate cancer screening. Recommendations will vary depending on your family history and other risks. Hepatitis C blood test. Hepatitis B blood test. Sexually transmitted disease (STD) testing. Diabetes screening. This is done by checking your blood sugar (glucose) after you have not eaten for a while (fasting). You may have this done every 1-3 years. Abdominal aortic aneurysm (AAA) screening. You may need this if you are a current or former smoker. Osteoporosis. You may be screened starting at age 61 if you are at high risk. Talk with your health care provider about your test results, treatment options, and if necessary, the need for more tests. Vaccines  Your health care provider may recommend certain vaccines, such as: Influenza vaccine. This is recommended every year. Tetanus, diphtheria, and acellular pertussis (Tdap, Td) vaccine. You may need a Td booster every 10 years. Zoster vaccine. You may need this after age 58. Pneumococcal 13-valent conjugate (PCV13) vaccine. One dose is recommended after age 88. Pneumococcal polysaccharide (PPSV23) vaccine. One dose is recommended after age 33. Talk to your health care provider about which screenings and vaccines  you need and how often you need them. This information is not intended to replace  advice given to you by your health care provider. Make sure you discuss any questions you have with your health care provider. Document Released: 09/10/2015 Document Revised: 05/03/2016 Document Reviewed: 06/15/2015 Elsevier Interactive Patient Education  2017 [place] Prevention in the Home Falls can cause injuries. They can happen to people of all ages. There are many things you can do to make your home safe and to help prevent falls. What can I do on the outside of my home? Regularly fix the edges of walkways and driveways and fix any cracks. Remove anything that might make you trip as you walk through a door, such as a raised step or threshold. Trim any bushes or trees on the path to your home. Use bright outdoor lighting. Clear any walking paths of anything that might make someone trip, such as rocks or tools. Regularly check to see if handrails are loose or broken. Make sure that both sides of any steps have handrails. Any raised decks and porches should have guardrails on the edges. Have any leaves, snow, or ice cleared regularly. Use sand or salt on walking paths during winter. Clean up any spills in your garage right away. This includes oil or grease spills. What can I do in the bathroom? Use night lights. Install grab bars by the toilet and in the tub and shower. Do not use towel bars as grab bars. Use non-skid mats or decals in the tub or shower. If you need to sit down in the shower, use a plastic, non-slip stool. Keep the floor dry. Clean up any water that spills on the floor as soon as it happens. Remove soap buildup in the tub or shower regularly. Attach bath mats securely with double-sided non-slip rug tape. Do not have throw rugs and other things on the floor that can make you trip. What can I do in the bedroom? Use night lights. Make sure that you have a light by your bed that is easy to reach. Do not use any sheets or blankets that are too big for your bed.  They should not hang down onto the floor. Have a firm chair that has side arms. You can use this for support while you get dressed. Do not have throw rugs and other things on the floor that can make you trip. What can I do in the kitchen? Clean up any spills right away. Avoid walking on wet floors. Keep items that you use a lot in easy-to-reach places. If you need to reach something above you, use a strong step stool that has a grab bar. Keep electrical cords out of the way. Do not use floor polish or wax that makes floors slippery. If you must use wax, use non-skid floor wax. Do not have throw rugs and other things on the floor that can make you trip. What can I do with my stairs? Do not leave any items on the stairs. Make sure that there are handrails on both sides of the stairs and use them. Fix handrails that are broken or loose. Make sure that handrails are as long as the stairways. Check any carpeting to make sure that it is firmly attached to the stairs. Fix any carpet that is loose or worn. Avoid having throw rugs at the top or bottom of the stairs. If you do have throw rugs, attach them to the floor with carpet tape. Make sure  that you have a light switch at the top of the stairs and the bottom of the stairs. If you do not have them, ask someone to add them for you. What else can I do to help prevent falls? Wear shoes that: Do not have high heels. Have rubber bottoms. Are comfortable and fit you well. Are closed at the toe. Do not wear sandals. If you use a stepladder: Make sure that it is fully opened. Do not climb a closed stepladder. Make sure that both sides of the stepladder are locked into place. Ask someone to hold it for you, if possible. Clearly mark and make sure that you can see: Any grab bars or handrails. First and last steps. Where the edge of each step is. Use tools that help you move around (mobility aids) if they are needed. These  include: Canes. Walkers. Scooters. Crutches. Turn on the lights when you go into a dark area. Replace any light bulbs as soon as they burn out. Set up your furniture so you have a clear path. Avoid moving your furniture around. If any of your floors are uneven, fix them. If there are any pets around you, be aware of where they are. Review your medicines with your doctor. Some medicines can make you feel dizzy. This can increase your chance of falling. Ask your doctor what other things that you can do to help prevent falls. This information is not intended to replace advice given to you by your health care provider. Make sure you discuss any questions you have with your health care provider. Document Released: 06/10/2009 Document Revised: 01/20/2016 Document Reviewed: 09/18/2014 Elsevier Interactive Patient Education  2017 [employer].

## 2021-10-10 NOTE — Progress Notes (Signed)
Subjective:   Austin [name] is a 76 y.o. male who presents for Medicare Annual/Subsequent preventive examination.  I connected with  [name] on 10/10/21 by a telephone enabled telemedicine application and verified that I am speaking with the correct person using two identifiers.   I discussed the limitations of evaluation and management by telemedicine. The patient expressed understanding and agreed to proceed.  Patient location: home  Provider location: Tele-Health not in office    Review of Systems     Cardiac Risk Factors include: advanced age (>71men, >69 women);hypertension;male gender     Objective:    Today's Vitals   There is no height or weight on file to calculate BMI.  Advanced Directives 10/10/2021 10/10/2021 09/17/2020 04/17/2019 01/10/2019 12/05/2017 09/04/2017  Does Patient Have a Medical Advance Directive? Yes Yes Yes No No No No  Type of Advance Directive Living will Living will Living will;Healthcare Power of Attorney - - - -  Does patient want to make changes to medical advance directive? - - No - Patient declined - - - -  Copy of [place] in Chart? - - No - copy requested - - - -  Would patient like information on creating a medical advance directive? - - - No - Patient declined - No - Patient declined -    Current Medications (verified) Outpatient Encounter Medications as of 10/10/2021  Medication Sig   Alcohol Swabs (B-D SINGLE USE SWABS REGULAR) PADS 1 each by Does not apply route as needed.   amLODipine (NORVASC) 10 MG tablet Take 1 tablet (10 mg total) by mouth daily.   aspirin 81 MG tablet Take 1 tablet (81 mg total) by mouth daily.   clopidogrel (PLAVIX) 75 MG tablet TAKE 1 TABLET EVERY DAY   dorzolamide-timolol (COSOPT) 22.3-6.8 MG/ML ophthalmic solution Place 1 drop into both eyes 2 (two) times daily.    finasteride (PROSCAR) 5 MG tablet TAKE 1 TABLET EVERY DAY   latanoprost (XALATAN) 0.005 % ophthalmic solution  Place 1 drop into both eyes at bedtime.   nitroGLYCERIN (NITROSTAT) 0.4 MG SL tablet PLACE 1 TABLET UNDER THE TONGUE AS NEEDED FOR CHEST PAIN   Study - ORION 4 - inclisiran 300 mg/1.75mL or placebo SQ injection (PI-Stuckey) Inject 300 mg into the skin every 6 (six) months.   No facility-administered encounter medications on file as of 10/10/2021.    Allergies (verified) Zetia [ezetimibe], Crestor [rosuvastatin], Isosorbide mononitrate [isosorbide nitrate], Pravachol [pravastatin sodium], and Silver   History: Past Medical History:  Diagnosis Date   Allergy    Arthritis    CAP (community acquired pneumonia)    Cataract    bilateral   Coronary artery disease    03/13/2016 DES to first OM, EF 55% // Myoview 12/19: EF 55, ant-lat infarction, no significant ischemia, Low Risk   Heart murmur    "outgrew it" (03/13/2016)   Hematuria    HTN (hypertension)    Hx of cardiovascular stress test    ETT-Myoview (10/15): Nondiagnostic EKG changes, anterolateral and inferolateral scar, no ischemia, EF 48%, low risk   Hyperlipemia    Hyperlipidemia    NMR Lipoprofile 2008: LDL 168 ( 2410/ 1826), HDL 41, TG 71. LDL goal = <100, ideally <  70. Framingham Study LDL goal = < 130.   Kidney stones    "passed them"; Dr. Terance Hart (03/13/2016)   Myocardial infarction [facility]) 2001   Non Hodgkin's [facility]   Pleural effusion  TIA (transient ischemic attack) 1980s   "I was exercising when I had it"   Past Surgical History:  Procedure Laterality Date   CARDIAC CATHETERIZATION N/A 12/22/2015   Procedure: Left Heart Cath and Coronary Angiography;  Surgeon: Sherren Mocha, MD;  Location: [place] [facility];  Service: Cardiovascular;  Laterality: N/A;   CARDIAC CATHETERIZATION N/A 12/22/2015   Procedure: Coronary Stent Intervention;  Surgeon: Sherren Mocha, MD;  Location: [place] [facility];  Service: Cardiovascular;  Laterality: N/A;   CARDIAC CATHETERIZATION N/A 12/22/2015    Procedure: Intravascular Pressure Wire/FFR Study;  Surgeon: Sherren Mocha, MD;  Location: [place] [facility];  Service: Cardiovascular;  Laterality: N/A;   CARDIAC CATHETERIZATION N/A 03/13/2016   Procedure: Left Heart Cath and Coronary Angiography;  Surgeon: Sherren Mocha, MD;  Location: [place] [facility];  Service: Cardiovascular;  Laterality: N/A;   COLONOSCOPY  2002   CORONARY ANGIOPLASTY WITH STENT PLACEMENT  ~ 2013   "1 stent"   ESOPHAGOGASTRODUODENOSCOPY (EGD) WITH ESOPHAGEAL DILATION  2002   KNEE ARTHROSCOPY Left 1990s   Family History  Problem Relation Age of Onset   Cancer Mother        lung cancer   Diabetes Mother    Hyperlipidemia Mother    Stroke Father        age 39   Prostate cancer Father    Cancer Sister        ?lymphoma   Social History   Socioeconomic History   Marital status: Married    Spouse name: Not on file   Number of children: 3   Years of education: Not on file   Highest education level: Not on file  Occupational History   Occupation: retired Higher education careers adviser  Tobacco Use   Smoking status: Never   Smokeless tobacco: Never  Vaping Use   Vaping Use: Never used  Substance and Sexual Activity   Alcohol use: No    Comment: drank some in my 20's"   Drug use: No   Sexual activity: Yes  Other Topics Concern   Not on file  Social History Narrative   Not on file   Social Determinants of Health   Financial Resource Strain: Low Risk    Difficulty of Paying Living Expenses: Not hard at all  Food Insecurity: No Food Insecurity   Worried About Charity fundraiser in the Last Year: Never true   [place] in the Last Year: Never true  Transportation Needs: No Transportation Needs   Lack of Transportation (Medical): No   Lack of Transportation (Non-Medical): No  Physical Activity: Insufficiently Active   Days of Exercise per Week: 3 days   Minutes of Exercise per Session: 40 min  Stress: No Stress Concern Present   Feeling of Stress : Not at all   Social Connections: Socially Integrated   Frequency of Communication with Friends and Family: More than three times a week   Frequency of Social Gatherings with Friends and Family: More than three times a week   Attends Religious Services: More than 4 times per year   Active Member of [employer] or Organizations: Yes   Attends Music therapist: More than 4 times per year   Marital Status: Married    Tobacco Counseling Counseling given: Not Answered   Clinical Intake:     Pain : No/denies pain     Nutritional Risks: None Diabetes: No  How often do you need to have someone help you when you read instructions,  pamphlets, or other written materials from your doctor or pharmacy?: 1 - Never  Diabetic?  no  Interpreter Needed?: No  Information entered by :: Leroy Kennedy LPN   Activities of Daily Living In your present state of health, do you have any difficulty performing the following activities: 10/10/2021 10/10/2021  Hearing? N N  Vision? N N  Difficulty concentrating or making decisions? N -  Walking or climbing stairs? N -  Dressing or bathing? N -  Doing errands, shopping? N -  Preparing Food and eating ? N -  Using the Toilet? N -  In the past six months, have you accidently leaked urine? N -  Do you have problems with loss of bowel control? N -  Managing your Medications? N -  Managing your Finances? N -  Housekeeping or managing your Housekeeping? N -  Some recent data might be hidden    Patient Care Team: Binnie Rail, MD as PCP - General (Internal Medicine) Sherren Mocha, MD as PCP - Cardiology (Cardiology) Charlton Haws,  [facility] as Pharmacist (Pharmacist) Hortencia Pilar, MD as Consulting Physician (Ophthalmology) Sharmon Revere as Physician Assistant (Cardiology)  Indicate any recent Medical Services you may have received from other than [facility] providers in the past year (date may be approximate).     Assessment:   This is a  routine wellness examination for [place].  Hearing/Vision screen Hearing Screening - Comments:: No trouble hearing Vision Screening - Comments:: Dr. Pandora Leiter Up to date  Dietary issues and exercise activities discussed: Current Exercise Habits: Structured exercise class, Type of exercise: treadmill;strength training/weights (bicycle), Time (Minutes): 45, Frequency (Times/Week): 3, Weekly Exercise (Minutes/Week): 135, Intensity: Moderate   Goals Addressed             This Visit's Progress    Patient Stated       Would like to keep weight at 200 And keep going to the gym        Depression Screen PHQ 2/9 Scores 10/10/2021 09/17/2020 04/16/2020 03/10/2019 01/10/2019 09/10/2018 01/11/2018  PHQ - 2 Score 0 0 0 0 0 0 0  PHQ- 9 Score - - - - - - -    Fall Risk Fall Risk  10/10/2021 09/17/2020 09/22/2019 03/10/2019 01/10/2019  Falls in the past year? 0 0 0 0 0  Number falls in past yr: 0 0 0 0 0  Injury with Fall? 0 0 - - -  Risk for fall due to : - No Fall Risks - - -  Follow up Falls evaluation completed;Education provided;Falls prevention discussed - - - -    FALL RISK PREVENTION PERTAINING TO THE HOME:  Any stairs in or around the home? Yes  If so, are there any without handrails? No  Home free of loose throw rugs in walkways, pet beds, electrical cords, etc? Yes  Adequate lighting in your home to reduce risk of falls? Yes   ASSISTIVE DEVICES UTILIZED TO PREVENT FALLS:  Life alert? No  Use of a cane, walker or w/c? No  Grab bars in the bathroom? Yes  Shower chair or bench in shower? No  Elevated toilet seat or a handicapped toilet? No   TIMED UP AND GO:  Was the test performed? No .    Cognitive Function:  Normal cognitive status assessed by direct observation by this Nurse Health Advisor. No abnormalities found.          Immunizations Immunization History  Administered Date(s) Administered   Fluad Quad(high Dose  65+) 06/07/2019, 06/16/2020, 06/21/2021   Influenza,  High Dose Seasonal PF 06/24/2018   Influenza-Unspecified 08/23/2017, 07/04/2018   PFIZER(Purple Top)SARS-COV-2 Vaccination 10/02/2019, 10/23/2019, 07/31/2020   Pneumococcal Conjugate-13 12/05/2017   Pneumococcal Polysaccharide-23 08/28/2010   Td 05/16/2006   Zoster Recombinat (Shingrix) 07/01/2019, 11/15/2019    TDAP status: Due, Education has been provided regarding the importance of this vaccine. Advised may receive this vaccine at local pharmacy or Health Dept. Aware to provide a copy of the vaccination record if obtained from local pharmacy or Health Dept. Verbalized acceptance and understanding.  Flu Vaccine status: Up to date  Pneumococcal vaccine status: Up to date  Covid-19 vaccine status: Information provided on how to obtain vaccines.   Qualifies for Shingles Vaccine? No   Zostavax completed No   Shingrix Completed?: Yes  Screening Tests Health Maintenance  Topic Date Due   COVID-19 Vaccine (4 - Booster for Pfizer series) 10/26/2021 (Originally 09/25/2020)   TETANUS/TDAP  06/21/2022 (Originally 05/16/2016)   Pneumonia Vaccine 60+ Years old  Completed   INFLUENZA VACCINE  Completed   Hepatitis C Screening  Completed   Zoster Vaccines- Shingrix  Completed   HPV VACCINES  Aged Out   COLONOSCOPY (Pts 45-40yrs Insurance coverage will need to be confirmed)  Discontinued    Health Maintenance  There are no preventive care reminders to display for this patient.   Colorectal cancer screening: Type of screening: Colonoscopy. Completed 2019. Repeat every 0 years  Lung Cancer Screening: (Low Dose CT Chest recommended if Age 31-80 years, 30 pack-year currently smoking OR have quit w/in 15years.) does not qualify.   Lung Cancer Screening Referral:   Additional Screening:  Hepatitis C Screening: does not qualify; Completed 2018  Vision Screening: Recommended annual ophthalmology exams for early detection of glaucoma and other disorders of the eye. Is the patient up to date  with their annual eye exam?  Yes  Who is the provider or what is the name of the office in which the patient attends annual eye exams? [facility] If pt is not established with a provider, would they like to be referred to a provider to establish care? No .   Dental Screening: Recommended annual dental exams for proper oral hygiene  Community Resource Referral / Chronic Care Management: CRR required this visit?  No   CCM required this visit?  No      Plan:     I have personally reviewed and noted the following in the patients chart:   Medical and social history Use of alcohol, tobacco or illicit drugs  Current medications and supplements including opioid prescriptions. Patient is not currently taking opioid prescriptions. Functional ability and status Nutritional status Physical activity Advanced directives List of other physicians Hospitalizations, surgeries, and ER visits in previous 12 months Vitals Screenings to include cognitive, depression, and falls Referrals and appointments  In addition, I have reviewed and discussed with patient certain preventive protocols, quality metrics, and best practice recommendations. A written personalized care plan for preventive services as well as general preventive health recommendations were provided to patient.     Leroy Kennedy, LPN   [id number]   Nurse Notes:

## 2021-10-11 ENCOUNTER — Telehealth: Payer: Self-pay

## 2021-10-11 DIAGNOSIS — Z8572 Personal history of non-Hodgkin lymphomas: Secondary | ICD-10-CM

## 2021-10-11 NOTE — Telephone Encounter (Signed)
Pt is requesting an Materials engineer locally in [place]. Currently seeing a provider in [place] but the drive is becoming an issue and want it closer to home.  Please advise Pt CB [phone]

## 2021-10-11 NOTE — Telephone Encounter (Signed)
Referral ordered

## 2021-10-12 ENCOUNTER — Telehealth: Payer: Self-pay | Admitting: Physician Assistant

## 2021-10-12 NOTE — Telephone Encounter (Signed)
changed

## 2021-10-12 NOTE — Telephone Encounter (Signed)
°  Patient sent MyChart message requesting appt with the reason "chest tightness". Called patient to get clarification on symptoms. He said that it comes and goes after walking and doing activity.   Pt c/o of Chest Pain: STAT if CP now or developed within 24 hours  1. Are you having CP right now? no  2. Are you experiencing any other symptoms (ex. SOB, nausea, vomiting, sweating)? no  3. How long have you been experiencing CP? About a week  4. Is your CP continuous or coming and going? Comes and goes  5. Have you taken Nitroglycerin?no. Patient said his bottle is expired   The patient is scheduled to see Richardson Dopp 12/21/21 at 9:15.  ?

## 2021-10-12 NOTE — Telephone Encounter (Signed)
Left a message for the pt to call back.   LM on cell and home number.

## 2021-10-12 NOTE — Telephone Encounter (Signed)
Left message to call back  

## 2021-10-18 NOTE — Telephone Encounter (Signed)
Called and spoke to patient who states that he is no longer having any issues with chest pain at all, even with exercise. Pt states that these symptoms were occurring for about 6 days, but that he was also experiencing some "digestive system problems" at the same time and thinks that it was that which was causing it. Informed pt that he has an active Rx at [facility] for NTG and he should pick it up and have it on hand for times such as this. Informed pt to call us back if he needs [place] or begins to have these problems again.

## 2021-10-18 NOTE — Telephone Encounter (Signed)
Pt has appt w me in April.  Can we see if we can move up his appt earlier?   If the pt prefers to wait until April, make sure he knows to call if he has recurrent symptoms to get earlier appt. If he has severe chest pain or chest pain that will not resolve with rest, he should go to the ED. Richardson Dopp, PA-C    10/18/2021 12:34 PM

## 2021-10-19 NOTE — Telephone Encounter (Signed)
S/w pt prefers appointment moved up. Advised if recurrent CP with rest to go to ED.

## 2021-10-21 DIAGNOSIS — C8290 Follicular lymphoma, unspecified, unspecified site: Secondary | ICD-10-CM | POA: Diagnosis not present

## 2021-11-07 ENCOUNTER — Other Ambulatory Visit: Payer: Self-pay

## 2021-11-07 DIAGNOSIS — Z006 Encounter for examination for normal comparison and control in clinical research program: Secondary | ICD-10-CM

## 2021-11-07 NOTE — Research (Signed)
Patient seen in clinic for ORION 4 Month 39 visit. Patient states he has been doing well since last visit and denies any adverse events or hospitalizations since last clinic visit. Medications reviewed with patient and no changes needed at this time. Fasting labs were drawn per the ORION 4 protocol and patient tolerated without complaints. IP was given @ 0950 in the LLQ and tolerated well. Patient to return 05-08-2022 @ 9 am for Month 45 visit and no labs will be due at that time. ? ? ?Current Outpatient Medications:  ?  Alcohol Swabs (B-D SINGLE USE SWABS REGULAR) PADS, 1 each by Does not apply route as needed., Disp: 3 each, Rfl: 3 ?  amLODipine (NORVASC) 10 MG tablet, Take 1 tablet (10 mg total) by mouth daily., Disp: 90 tablet, Rfl: 3 ?  aspirin 81 MG tablet, Take 1 tablet (81 mg total) by mouth daily., Disp: 30 tablet, Rfl:  ?  clopidogrel (PLAVIX) 75 MG tablet, TAKE 1 TABLET EVERY DAY, Disp: 90 tablet, Rfl: 1 ?  dorzolamide-timolol (COSOPT) 22.3-6.8 MG/ML ophthalmic solution, Place 1 drop into both eyes 2 (two) times daily. , Disp: , Rfl:  ?  finasteride (PROSCAR) 5 MG tablet, TAKE 1 TABLET EVERY DAY, Disp: 90 tablet, Rfl: 2 ?  latanoprost (XALATAN) 0.005 % ophthalmic solution, Place 1 drop into both eyes at bedtime., Disp: , Rfl:  ?  nitroGLYCERIN (NITROSTAT) 0.4 MG SL tablet, PLACE 1 TABLET UNDER THE TONGUE AS NEEDED FOR CHEST PAIN, Disp: 25 tablet, Rfl: 9 ?  Study - ORION 4 - inclisiran 300 mg/1.62m or placebo SQ injection (PI-Stuckey), Inject 300 mg into the skin every 6 (six) months., Disp: , Rfl:   ?
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
## 2021-11-09 ENCOUNTER — Ambulatory Visit
Admission: RE | Admit: 2021-11-09 | Discharge: 2021-11-09 | Disposition: A | Discharge status: Home / Self Care | Payer: Medicare HMO | Source: Ambulatory Visit | Attending: Physician Assistant | Admitting: Physician Assistant

## 2021-11-09 ENCOUNTER — Other Ambulatory Visit: Payer: Self-pay

## 2021-11-09 ENCOUNTER — Encounter: Payer: Self-pay | Admitting: Physician Assistant

## 2021-11-09 ENCOUNTER — Ambulatory Visit: Payer: Medicare HMO | Admitting: Physician Assistant

## 2021-11-09 VITALS — BP 142/80 | HR 62 | Ht 74.0 in | Wt 201.4 lb

## 2021-11-09 DIAGNOSIS — I10 Essential (primary) hypertension: Secondary | ICD-10-CM | POA: Diagnosis not present

## 2021-11-09 DIAGNOSIS — I25118 Atherosclerotic heart disease of native coronary artery with other forms of angina pectoris: Secondary | ICD-10-CM

## 2021-11-09 DIAGNOSIS — R072 Precordial pain: Secondary | ICD-10-CM

## 2021-11-09 DIAGNOSIS — I77810 Thoracic aortic ectasia: Secondary | ICD-10-CM | POA: Diagnosis not present

## 2021-11-09 DIAGNOSIS — E782 Mixed hyperlipidemia: Secondary | ICD-10-CM

## 2021-11-09 DIAGNOSIS — R079 Chest pain, unspecified: Secondary | ICD-10-CM | POA: Diagnosis not present

## 2021-11-09 NOTE — Patient Instructions (Signed)
Medication Instructions:  ?Your physician recommends that you continue on your current medications as directed. Please refer to the Current Medication list given to you today. ? ?*If you need a refill on your cardiac medications before your next appointment, please call your pharmacy* ? ? ?Lab Work: ?None ordered ? ?If you have labs (blood work) drawn today and your tests are completely normal, you will receive your results only by: ?MyChart Message (if you have MyChart) OR ?A paper copy in the mail ?If you have any lab test that is abnormal or we need to change your treatment, we will call you to review the results. ? ? ?Testing/Procedures: ?A chest x-ray takes a picture of the organs and structures inside the chest, including the heart, lungs, and blood vessels. This test can show several things, including, whether the heart is enlarges; whether fluid is building up in the lungs; and whether pacemaker / defibrillator leads are still in place. ?Please go today: ? ?[facility] ?[address], [address] ?[place], [place] [address] ?[phone] ? ? ?Your physician has requested that you have an echocardiogram. Echocardiography is a painless test that uses sound waves to create images of your heart. It provides your doctor with information about the size and shape of your heart and how well your heart?s chambers and valves are working. This procedure takes approximately one hour. There are no restrictions for this procedure. ? ?Your physician has requested that you have en exercise stress myoview. For further information please visit [email or web address]. Please follow instruction sheet, BELOW: ? ? ? ?You are scheduled for a Myocardial Perfusion Imaging Study  ?Please arrive 15 minutes prior to your appointment time for registration and insurance purposes. ? ?The test will take approximately 3 to 4 hours to complete; you may bring reading material.  If someone comes with you to your appointment, they will need to  remain in the main lobby due to limited space in the testing area. **If you are pregnant or breastfeeding, please notify the nuclear lab prior to your appointment** ? ?How to prepare for your Myocardial Perfusion Test: ?Do not eat or drink 3 hours prior to your test, except you may have water. ?Do not consume products containing caffeine (regular or decaffeinated) 12 hours prior to your test. (ex: coffee, chocolate, sodas, tea). ?Do bring a list of your current medications with you.  If not listed below, you may take your medications as normal. ?Do wear comfortable clothes (no dresses or overalls) and walking shoes, tennis shoes preferred (No heels or open toe shoes are allowed). ?Do NOT wear cologne, perfume, aftershave, or lotions (deodorant is allowed). ?If these instructions are not followed, your test will have to be rescheduled. ? ? ? ?Follow-Up: ?At [facility], you and your health needs are our priority.  As part of our continuing mission to provide you with exceptional heart care, we have created designated Provider Care Teams.  These Care Teams include your primary Cardiologist (physician) and Advanced Practice Providers (APPs -  Physician Assistants and Nurse Practitioners) who all work together to provide you with the care you need, when you need it. ? ?We recommend signing up for the patient portal called "MyChart".  Sign up information is provided on this After Visit Summary.  MyChart is used to connect with patients for Virtual Visits (Telemedicine).  Patients are able to view lab/test results, encounter notes, upcoming appointments, etc.  Non-urgent messages can be sent to your provider as well.   ?To learn more  about what you can do with MyChart, go to [email or web address].   ? ?Your next appointment:   ?6 month(s) ? ?The format for your next appointment:   ?In Person ? ?Provider:   ?Sherren Mocha, MD  or Richardson Dopp, PA-C       ? ? ?Other Instructions ? ? ?

## 2021-11-09 NOTE — Assessment & Plan Note (Signed)
He remains in the [place] trial. ?

## 2021-11-09 NOTE — Assessment & Plan Note (Signed)
42 mm on echocardiogram in 2018.  Arrange f/u echocardiogram.  ?

## 2021-11-09 NOTE — Assessment & Plan Note (Signed)
History of prior myocardial infarction and multiple PCI procedures.  Last PCI in July 2017 was with a DES to the OM1.  He has residual small vessel disease and distal vessel disease which is treated medically.  As noted, he has had some L sided chest pain recently.  Proceed with Myoview as noted.  Continue Norvasc 10 mg once daily, ASA 81 mg once daily, Plavix 75 mg once daily.   ?

## 2021-11-09 NOTE — Assessment & Plan Note (Signed)
BP at home is optimal.  Continue Norvasc 10 mg once daily.   ?
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
## 2021-11-11 DIAGNOSIS — C8290 Follicular lymphoma, unspecified, unspecified site: Secondary | ICD-10-CM | POA: Diagnosis not present

## 2021-11-11 DIAGNOSIS — I25118 Atherosclerotic heart disease of native coronary artery with other forms of angina pectoris: Secondary | ICD-10-CM | POA: Diagnosis not present

## 2021-11-11 DIAGNOSIS — Z87442 Personal history of urinary calculi: Secondary | ICD-10-CM | POA: Diagnosis not present

## 2021-11-11 DIAGNOSIS — C8281 Other types of follicular lymphoma, lymph nodes of head, face, and neck: Secondary | ICD-10-CM | POA: Diagnosis not present

## 2021-11-11 DIAGNOSIS — N400 Enlarged prostate without lower urinary tract symptoms: Secondary | ICD-10-CM | POA: Diagnosis not present

## 2021-11-11 DIAGNOSIS — I10 Essential (primary) hypertension: Secondary | ICD-10-CM | POA: Diagnosis not present

## 2021-11-24 DIAGNOSIS — Z006 Encounter for examination for normal comparison and control in clinical research program: Secondary | ICD-10-CM

## 2021-11-24 MED ORDER — STUDY - ORION 4 - INCLISIRAN 300 MG/1.5 ML OR PLACEBO SQ INJECTION (PI-STUCKEY): 300.0000 mg | INJECTION | SUBCUTANEOUS | 0 refills | Status: AC

## 2021-12-06 ENCOUNTER — Telehealth (HOSPITAL_COMMUNITY): Payer: Self-pay | Admitting: *Deleted

## 2021-12-06 NOTE — Telephone Encounter (Signed)
error 

## 2021-12-13 ENCOUNTER — Encounter (HOSPITAL_COMMUNITY): Payer: Medicare HMO

## 2021-12-13 ENCOUNTER — Other Ambulatory Visit (HOSPITAL_COMMUNITY): Payer: Medicare HMO

## 2021-12-21 ENCOUNTER — Ambulatory Visit: Payer: Medicare HMO | Admitting: Physician Assistant

## 2022-01-10 ENCOUNTER — Other Ambulatory Visit (HOSPITAL_COMMUNITY): Payer: Medicare HMO

## 2022-01-10 ENCOUNTER — Encounter (HOSPITAL_COMMUNITY): Payer: Medicare HMO

## 2022-01-23 ENCOUNTER — Other Ambulatory Visit: Payer: Self-pay | Admitting: Cardiovascular Disease

## 2022-01-25 ENCOUNTER — Ambulatory Visit: Payer: Medicare HMO | Admitting: Internal Medicine

## 2022-01-26 ENCOUNTER — Encounter (HOSPITAL_COMMUNITY): Payer: Self-pay | Admitting: *Deleted

## 2022-01-26 ENCOUNTER — Telehealth (HOSPITAL_COMMUNITY): Payer: Self-pay | Admitting: *Deleted

## 2022-01-26 NOTE — Telephone Encounter (Signed)
Attempted to call patient regarding upcoming appointment- no answer, unable to leave a message. Letter sent with instructions to my chart.  Austin [name]

## 2022-02-01 ENCOUNTER — Other Ambulatory Visit (HOSPITAL_COMMUNITY): Payer: Medicare HMO

## 2022-02-01 ENCOUNTER — Encounter (HOSPITAL_COMMUNITY): Payer: Medicare HMO

## 2022-02-02 ENCOUNTER — Encounter: Payer: Self-pay | Admitting: Internal Medicine

## 2022-02-02 DIAGNOSIS — H25813 Combined forms of age-related cataract, bilateral: Secondary | ICD-10-CM | POA: Diagnosis not present

## 2022-02-02 DIAGNOSIS — H40033 Anatomical narrow angle, bilateral: Secondary | ICD-10-CM | POA: Diagnosis not present

## 2022-02-02 DIAGNOSIS — H401111 Primary open-angle glaucoma, right eye, mild stage: Secondary | ICD-10-CM | POA: Diagnosis not present

## 2022-02-02 DIAGNOSIS — H401122 Primary open-angle glaucoma, left eye, moderate stage: Secondary | ICD-10-CM | POA: Diagnosis not present

## 2022-02-02 NOTE — Progress Notes (Signed)
Subjective:    Patient ID: Austin [name], male    DOB: 16-Mar-1946, 76 y.o.   MRN: [id number]      HPI Austin [name] is here for  Chief Complaint  Patient presents with   Constipation    Constipation -   his lifestyle has changed.  His wife had knee surgery and he is the caregiver.  He is needing to do a lot more and has had less time for his usual routine.  He has not been exercising on a regular basis like he typically does.  He is having a BM every 2-3 days and he has to take something.  Prior it was daily.  He is drinking a lot of water and some soda.    He is eating less than he usually eats-he is getting more according to his wife schedule than his typical schedule, which has resulted in him eating less.  He has lost weight.  Breakfast - one piece of toast, one sasage and Oj - eats later.  No lunch.  Then eats dinner.  He is definitely eating less. Sometimes he only eats once a day.  His wife is getting better and he thinks he will be able to get back to his typical routine at some point.  He thinks the constipation is starting to improve a little.  Medications and allergies reviewed with patient and updated if appropriate.  Current Outpatient Medications on File Prior to Visit  Medication Sig Dispense Refill   Alcohol Swabs (B-D SINGLE USE SWABS REGULAR) PADS 1 each by Does not apply route as needed. 3 each 3   amLODipine (NORVASC) 10 MG tablet Take 1 tablet (10 mg total) by mouth daily. Please keep upcoming appointment for future refills. Thank you. 90 tablet 0   aspirin 81 MG tablet Take 1 tablet (81 mg total) by mouth daily. 30 tablet    chlorhexidine (PERIDEX) 0.12 % solution SMARTSIG:By Mouth     clopidogrel (PLAVIX) 75 MG tablet TAKE 1 TABLET EVERY DAY 90 tablet 1   dorzolamide-timolol (COSOPT) 22.3-6.8 MG/ML ophthalmic solution Place 1 drop into both eyes 2 (two) times daily.      ibuprofen (ADVIL) 800 MG tablet Take 800 mg by mouth every 6 (six) hours as needed.      latanoprost (XALATAN) 0.005 % ophthalmic solution Place 1 drop into both eyes at bedtime.     nitroGLYCERIN (NITROSTAT) 0.4 MG SL tablet PLACE 1 TABLET UNDER THE TONGUE AS NEEDED FOR CHEST PAIN 25 tablet 9   Study - ORION 4 - inclisiran 300 mg/1.4m or placebo SQ injection (PI-Stuckey) Inject 1.5 mLs (300 mg total) into the skin every 6 (six) months. 1 mL 0   No current facility-administered medications on file prior to visit.    Review of Systems  Constitutional:  Positive for unexpected weight change. Negative for appetite change.  Respiratory:  Negative for shortness of breath.   Cardiovascular:  Negative for chest pain.  Gastrointestinal:  Positive for constipation. Negative for abdominal pain, blood in stool and nausea.       Rare  gerd  Neurological:  Negative for light-headedness and headaches.       Objective:   Vitals:   02/03/22 0834  BP: 130/68  Pulse: (!) 56  Temp: 98.2 F (36.8 C)  SpO2: 99%   BP Readings from Last 3 Encounters:  02/03/22 130/68  11/09/21 (!) 142/80  09/06/21 140/82   Wt Readings from Last 3 Encounters:  02/03/22 192 lb (  87.1 kg)  11/09/21 201 lb 6.4 oz (91.4 kg)  09/06/21 200 lb (90.7 kg)   Body mass index is 24.65 kg/m.    Physical Exam Constitutional:      General: He is not in acute distress.    Appearance: Normal appearance.  HENT:     Head: Normocephalic and atraumatic.  Abdominal:     General: There is no distension.     Palpations: Abdomen is soft.     Tenderness: There is no abdominal tenderness. There is no guarding or rebound.  Skin:    General: Skin is warm and dry.  Neurological:     Mental Status: He is alert. Mental status is at baseline.            Assessment & Plan:    See Problem List for Assessment and Plan of chronic medical problems.

## 2022-02-03 ENCOUNTER — Ambulatory Visit (INDEPENDENT_AMBULATORY_CARE_PROVIDER_SITE_OTHER): Payer: Medicare HMO | Admitting: Internal Medicine

## 2022-02-03 DIAGNOSIS — K5909 Other constipation: Secondary | ICD-10-CM | POA: Diagnosis not present

## 2022-02-03 DIAGNOSIS — K5900 Constipation, unspecified: Secondary | ICD-10-CM | POA: Insufficient documentation

## 2022-02-03 DIAGNOSIS — I10 Essential (primary) hypertension: Secondary | ICD-10-CM

## 2022-02-03 NOTE — Assessment & Plan Note (Signed)
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  He is not exercising regularly.  He is eating differently and not eating as much.  He has even had some weight loss as a result No concerns symptoms such as abdominal pain, blood in the stool, nausea or other systemic symptoms to make me concerned that something else is causing the weight loss/constipation Discussed natural things that he could do to improve the constipation such as stool softener, extra fiber and food or supplement, magnesium, probiotics He will try some of these things and as his routine gets back to normal I think he will see improvement in the bowel He understands that if he is not seeing things return back to normal or if he continues to lose weight needs to contact me

## 2022-02-03 NOTE — Patient Instructions (Addendum)
    Things you can try for your constipation -   Docusate (colace)1-3 pills a day Probiotics Metamucil fiber or benefiber Smooth move tea Prunes, prune juice, dates or raisons Magnesium supplement over the counter     Return if symptoms worsen or fail to improve.    Constipation, Adult Constipation is when a person has fewer than three bowel movements in a week, has difficulty having a bowel movement, or has stools (feces) that are dry, hard, or larger than normal. Constipation may be caused by an underlying condition. It may become worse with age if a person takes certain medicines and does not take in enough fluids. Follow these instructions at home: Eating and drinking  Eat foods that have a lot of fiber, such as beans, whole grains, and fresh fruits and vegetables. Limit foods that are low in fiber and high in fat and processed sugars, such as fried or sweet foods. These include french fries, hamburgers, cookies, candies, and soda. Drink enough fluid to keep your urine pale yellow. General instructions Exercise regularly or as told by your health care provider. Try to do 150 minutes of moderate exercise each week. Use the bathroom when you have the urge to go. Do not hold it in. Take over-the-counter and prescription medicines only as told by your health care provider. This includes any fiber supplements. During bowel movements: Practice deep breathing while relaxing the lower abdomen. Practice pelvic floor relaxation. Watch your condition for any changes. Let your health care provider know about them. Keep all follow-up visits as told by your health care provider. This is important. Contact a health care provider if: You have pain that gets worse. You have a fever. You do not have a bowel movement after 4 days. You vomit. You are not hungry or you lose weight. You are bleeding from the opening between the buttocks (anus). You have thin, pencil-like stools. Get help  right away if: You have a fever and your symptoms suddenly get worse. You leak stool or have blood in your stool. Your abdomen is bloated. You have severe pain in your abdomen. You feel dizzy or you faint. Summary Constipation is when a person has fewer than three bowel movements in a week, has difficulty having a bowel movement, or has stools (feces) that are dry, hard, or larger than normal. Eat foods that have a lot of fiber, such as beans, whole grains, and fresh fruits and vegetables. Drink enough fluid to keep your urine pale yellow. Take over-the-counter and prescription medicines only as told by your health care provider. This includes any fiber supplements. This information is not intended to replace advice given to you by your health care provider. Make sure you discuss any questions you have with your health care provider. Document Revised: 07/02/2019 Document Reviewed: 07/02/2019 Elsevier Patient Education  [place].

## 2022-02-03 NOTE — Assessment & Plan Note (Signed)
Chronic Blood pressure well controlled Continue Norvasc 10 mg daily

## 2022-02-27 ENCOUNTER — Telehealth (HOSPITAL_COMMUNITY): Payer: Self-pay | Admitting: *Deleted

## 2022-02-27 NOTE — Telephone Encounter (Signed)
Patient given detailed instructions per Myocardial Perfusion Study Information Sheet for the test on 03/06/2022 at 8:00. Patient notified to arrive 15 minutes early and that it is imperative to arrive on time for appointment to keep from having the test rescheduled.  If you need to cancel or reschedule your appointment, please call the office within 24 hours of your appointment. . Patient verbalized understanding.Austin [name]

## 2022-03-06 ENCOUNTER — Ambulatory Visit (HOSPITAL_COMMUNITY): Payer: Medicare HMO

## 2022-03-06 ENCOUNTER — Encounter (HOSPITAL_COMMUNITY): Payer: Medicare HMO

## 2022-03-16 ENCOUNTER — Telehealth: Payer: Self-pay | Admitting: Cardiovascular Disease

## 2022-03-16 NOTE — Telephone Encounter (Signed)
Patient is complaining of CP on and off for ~ 2 months. The patient states that pain comes with activity in general, no shortness of breath but he has to stop the activity and the pain goes away taking 5-10 minutes. When these episodes occurred he feels like he is having a hot flash. The nitroglycerin helps the pain. Notes that his wife had knee surgery two months ago and his tasks increased around the house and taking care of his wife was added as well to his daily activities. BP ~140-130 over 80 when checked, he has not checked during an episode. Current VS 135/65 92. No missed doses of medication. Patient has Echo/Stress test scheduled for Aug 22. He notes he has had to reschedule this test a few times because of caring for his wife and not able to come in. Gave ED precautions.  Patient verbalized understanding and agreement.   Will forward to MD for advisement.

## 2022-03-16 NOTE — Telephone Encounter (Signed)
  Per MyChart scheduling message:  Pt c/o of Chest Pain: STAT if CP now or developed within 24 hours  1. Are you having CP right now?   2. Are you experiencing any other symptoms (ex. SOB, nausea, vomiting, sweating)?   3. How long have you been experiencing CP?   4. Is your CP continuous or coming and going?   5. Have you taken Nitroglycerin?   The pains come  and go ,sometimes sweating, certain movements  causes pain, taken nitroglycerin twice, off and on for two months. ?

## 2022-03-16 NOTE — Telephone Encounter (Signed)
Gave patient appointment date and time for 7/25 with Dr. Burt Knack.  Patient verbalized agreement and understanding with read back.

## 2022-03-21 ENCOUNTER — Ambulatory Visit: Payer: Medicare HMO | Admitting: Cardiovascular Disease

## 2022-03-21 ENCOUNTER — Encounter: Payer: Self-pay | Admitting: Cardiovascular Disease

## 2022-03-21 VITALS — BP 138/82 | HR 59 | Ht 73.0 in | Wt 194.6 lb

## 2022-03-21 DIAGNOSIS — E782 Mixed hyperlipidemia: Secondary | ICD-10-CM

## 2022-03-21 DIAGNOSIS — I77810 Thoracic aortic ectasia: Secondary | ICD-10-CM

## 2022-03-21 DIAGNOSIS — Z01810 Encounter for preprocedural cardiovascular examination: Secondary | ICD-10-CM

## 2022-03-21 DIAGNOSIS — I10 Essential (primary) hypertension: Secondary | ICD-10-CM | POA: Diagnosis not present

## 2022-03-21 DIAGNOSIS — I25118 Atherosclerotic heart disease of native coronary artery with other forms of angina pectoris: Secondary | ICD-10-CM | POA: Diagnosis not present

## 2022-03-21 NOTE — Patient Instructions (Signed)
Medication Instructions:   *If you need a refill on your cardiac medications before your next appointment, please call your pharmacy*   Lab Work: CBC AND BMET If you have labs (blood work) drawn today and your tests are completely normal, you will receive your results only by: [place] (if you have MyChart) OR A paper copy in the mail If you have any lab test that is abnormal or we need to change your treatment, we will call you to review the results.   Testing/Procedures:  [facility] OFFICE [place], [place] [address] Dept: [phone] Loc: [phone]  Austin [name]  03/21/2022  You are scheduled for a Cardiac Catheterization on Thursday, August 3 with Dr. Sherren Mocha.  1. Please arrive at the Main Entrance A at [facility] [place], [place] [address] at 7:00 AM (This time is two hours before your procedure to ensure your preparation). Free valet parking service is available.   Special note: Every effort is made to have your procedure done on time. Please understand that emergencies sometimes delay scheduled procedures.  2. Diet: Do not eat solid foods after midnight.  You may have clear liquids until 5 AM upon the day of the procedure.  3. Labs: You will need to have blood drawn on 03/22/2022.  4. Medication instructions in preparation for your procedure:   Contrast Allergy: No  On the morning of your procedure, take Aspirin and Plavix/Clopidogrel and any morning medicines NOT listed above.  You may use sips of water.  5. Plan to go home the same day, you will only stay overnight if medically necessary. 6. You MUST have a responsible adult to drive you home. 7. An adult MUST be with you the first 24 hours after you arrive home. 8. Bring a current list of your medications, and the last time and date medication taken. 9. Bring ID and  current insurance cards. 10.Please wear clothes that are easy to get on and off and wear slip-on shoes.  Thank you for allowing [place] to care for you!   -- [facility]    Follow-Up: At [facility], you and your health needs are our priority.  As part of our continuing mission to provide you with exceptional heart care, we have created designated Provider Care Teams.  These Care Teams include your primary Cardiologist (physician) and Advanced Practice Providers (APPs -  Physician Assistants and Nurse Practitioners) who all work together to provide you with the care you need, when you need it.  We recommend signing up for the patient portal called "MyChart".  Sign up information is provided on this After Visit Summary.  MyChart is used to connect with patients for Virtual Visits (Telemedicine).  Patients are able to view lab/test results, encounter notes, upcoming appointments, etc.  Non-urgent messages can be sent to your provider as well.   To learn more about what you can do with MyChart, go to [email or web address].     Important Information About Sugar

## 2022-03-21 NOTE — H&P (View-Only) (Signed)
Cardiology Office Note:    Date:  03/21/2022   ID:  Austin [name], DOB [birth year], MRN [id number]  PCP:  Binnie Rail, MD   [place] Prov[facility] Cardiologist:  Sherren Mocha, MD Cardiology APP:  Sharmon Revere     Referring MD: Binnie Rail, MD   Chief Complaint  Patient presents with   Chest Pain    History of Present Illness:    Austin [name] is a 76 y.o. male with a hx of: Coronary artery disease S/p Lat MI in 2012 tx with BMS to LCx PCI [name]/17: DES to prox LAD PCI 7/17: DES to OM1 Residual dz: severe small vessel dz in dist PDA, Dx branches of LAD, apical LAD >> med Rx Myoview 12/19: ant-lat infarct, no ischemia Follicular lymphoma, Gr I-II (Dr. Gloriann Loan, [facility]) Hypertension Hyperlipidemia Austin [name] Trial participant Hx of TIA Chest pain Aortic atherosclerosis   He is here with his wife today. He has been more active because his wife had knee replacement surgery.  The patient reports symptoms of left chest tightness and sharp pains with physical activity.  He states that a year ago he could do what ever he wanted without any chest pain or pressure.  About 6 months ago he began to feel some discomfort in the chest, associated with physical activity.  Sometimes his symptoms resolve if he continues to walk.  However, his symptoms have been more noticeable over the last 1 to 2 months.  He oftentimes has to stop his activities and he is unable to exercise due to concerns about chest discomfort.  His symptoms predictably stop with rest.  He denies shortness of breath, heart palpitations, lightheadedness, orthopnea, or PND.  Past Medical History:  Diagnosis Date   Allergy    Arthritis    CAP (community acquired pneumonia)    Cataract    bilateral   Coronary artery disease    03/13/2016 DES to first OM, EF 55% // Myoview 12/19: EF 55, ant-lat infarction, no significant ischemia, Low Risk   Heart murmur    "outgrew it" (03/13/2016)   Hematuria     HTN (hypertension)    Hx of cardiovascular stress test    ETT-Myoview (10/15): Nondiagnostic EKG changes, anterolateral and inferolateral scar, no ischemia, EF 48%, low risk   Hyperlipemia    Hyperlipidemia    NMR Lipoprofile 2008: LDL 168 ( 2410/ 1826), HDL 41, TG 71. LDL goal = <100, ideally <  70. Framingham Study LDL goal = < 130.   Kidney stones    "passed them"; Dr. Terance Hart (03/13/2016)   Myocardial infarction [facility]) 2001   Non Hodgkin's [facility]   Pleural effusion    TIA (transient ischemic attack) 1980s   "I was exercising when I had it"    Past Surgical History:  Procedure Laterality Date   CARDIAC CATHETERIZATION N/A [name]/26/2017   Procedure: Left Heart Cath and Coronary Angiography;  Surgeon: Sherren Mocha, MD;  Location: [facility];  Service: Cardiovascular;  Laterality: N/A;   CARDIAC CATHETERIZATION N/A [name]/26/2017   Procedure: Coronary Stent Intervention;  Surgeon: Sherren Mocha, MD;  Location: [facility];  Service: Cardiovascular;  Laterality: N/A;   CARDIAC CATHETERIZATION N/A [name]/26/2017   Procedure: Intravascular Pressure Wire/FFR Study;  Surgeon: Sherren Mocha, MD;  Location: [facility];  Service: Cardiovascular;  Laterality: N/A;   CARDIAC CATHETERIZATION N/A 03/13/2016   Procedure: Left Heart Cath and Coronary  Angiography;  Surgeon: Sherren Mocha, MD;  Location: [facility];  Service: Cardiovascular;  Laterality: N/A;   COLONOSCOPY  2002   CORONARY ANGIOPLASTY WITH STENT PLACEMENT  ~ 2013   "1 stent"   ESOPHAGOGASTRODUODENOSCOPY (EGD) WITH ESOPHAGEAL DILATION  2002   KNEE ARTHROSCOPY Left 1990s    Current Medications: Current Meds  Medication Sig   Alcohol Swabs (B-D SINGLE USE SWABS REGULAR) PADS 1 each by Does not apply route as needed.   amLODipine (NORVASC) 10 MG tablet Take 1 tablet (10 mg total) by mouth daily. Please keep upcoming appointment for future refills. Thank you.   aspirin 81 MG  tablet Take 1 tablet (81 mg total) by mouth daily.   chlorhexidine (PERIDEX) 0.12 % solution SMARTSIG:By Mouth   clopidogrel (PLAVIX) 75 MG tablet TAKE 1 TABLET EVERY DAY   dorzolamide-timolol (COSOPT) 22.3-6.8 MG/ML ophthalmic solution Place 1 drop into both eyes 2 (two) times daily.    latanoprost (XALATAN) 0.005 % ophthalmic solution Place 1 drop into both eyes at bedtime.   nitroGLYCERIN (NITROSTAT) 0.[name] MG SL tablet PLACE 1 TABLET UNDER THE TONGUE AS NEEDED FOR CHEST PAIN   Study - Austin [name] - inclisiran 300 mg/1.97m or placebo SQ injection (PI-[place]uckey) Inject 1.5 mLs (300 mg total) into the skin every 6 (six) months.     Allergies:   Zetia [ezetimibe], Crestor [rosuvastatin], Isosorbide, Isosorbide mononitrate [isosorbide nitrate], Pravachol [pravastatin sodium], and Silver   Social History   Socioeconomic History   Marital status: Married    Spouse name: Not on file   Number of children: 3   Years of education: Not on file   Highest education level: Not on file  Occupational History   Occupation: retired pHigher education careers adviser Tobacco Use   Smoking status: Never   Smokeless tobacco: Never  Vaping Use   Vaping Use: Never used  Substance and Sexual Activity   Alcohol use: No    Comment: drank some in my 20's"   Drug use: No   Sexual activity: Yes  Other Topics Concern   Not on file  Social History Narrative   Not on file   Social Determinants of Health   Financial Resource Strain: Low Risk  (10/10/2021)   Overall Financial Resource Strain (CARDIA)    Difficulty of Paying Living Expenses: Not hard at all  Food Insecurity: No Food Insecurity (10/10/2021)   Hunger Vital Sign    Worried About Running Out of Food in the Last Year: Never true    RMogulin the Last Year: Never true  Transportation Needs: No Transportation Needs (10/10/2021)   PRAPARE - THydrologist(Medical): No    Lack of Transportation (Non-Medical): No  Physical Activity:  Insufficiently Active (10/10/2021)   Exercise Vital Sign    Days of Exercise per Week: 3 days    Minutes of Exercise per Session: 40 min  Stress: No Stress Concern Present (10/10/2021)   FS[place]   Feeling of Stress : Not at all  Social Connections: [place](10/10/2021)   Social Connection and Isolation Panel [NHANES]    Frequency of Communication with Friends and Family: More than three times a week    Frequency of Social Gatherings with Friends and Family: More than three times a week    Attends Religious Services: More than [name] times per year    Active Member of [employer] Organizations: Yes  Attends Music therapist: More than [name] times per year    Marital Status: Married     Family History: The patient's family history includes Cancer in his mother and sister; Diabetes in his mother; Hyperlipidemia in his mother; Prostate cancer in his father; Stroke in his father.  ROS:   Please see the history of present illness.    All other systems reviewed and are negative.  EKGs/Labs/Other Studies Reviewed:    The following studies were reviewed today: The patient's cardiac catheterization films from 2017 are personally reviewed  EKG:  EKG is ordered today.  The ekg ordered today demonstrates sinus bradycardia 59 bpm, inferoposterior infarct age undetermined, ST and T wave abnormality consider lateral ischemia  Recent Labs: 06/21/2021: ALT 16; Hemoglobin 13.[name]; Platelets 266.0; Potassium [name].2; Sodium 138; TSH 2.66 08/12/2021: BUN 17; Creatinine, Ser 1.28  Recent Lipid Panel    Component Value Date/Time   CHOL 221 (H) 04/16/2020 1059   TRIG 108 04/16/2020 1059   HDL 49 04/16/2020 1059   CHOLHDL [name].5 04/16/2020 1059   VLDL 18.[name] 02/20/2018 1157   LDLCALC 150 (H) 04/16/2020 1059   LDLDIRECT 173.2 07/21/2013 0809     Risk Assessment/Calculations:           Physical Exam:    VS:  BP 138/82    Pulse (!) 59   Ht '6\' 1"'$  (1.854 m)   Wt 194 lb 9.6 oz (88.3 kg)   SpO2 96%   BMI 25.67 kg/m     Wt Readings from Last 3 Encounters:  03/21/22 194 lb 9.6 oz (88.3 kg)  02/03/22 192 lb (87.1 kg)  11/09/21 201 lb 6.[name] oz (91.[name] kg)     GEN:  Well nourished, well developed in no acute distress HEENT: Normal NECK: No JVD; No carotid bruits LYMPHATICS: No lymphadenopathy CARDIAC: RRR, no murmurs, rubs, gallops RESPIRATORY:  Clear to auscultation without rales, wheezing or rhonchi  ABDOMEN: Soft, non-tender, non-distended MUSCULOSKELETAL:  No edema; No deformity  SKIN: Warm and dry NEUROLOGIC:  Alert and oriented x 3 PSYCHIATRIC:  Normal affect   ASSESSMENT:    1. Essential hypertension   2. Mixed hyperlipidemia   3. Dilated aortic root ([place])   [name]. Coronary artery disease of native artery of native heart with stable angina pectoris ([place])   5. Pre-procedural cardiovascular examination    PLAN:    In order of problems listed above:  Blood pressure controlled on amlodipine.  Continue the same. Treated through the [facility] for inclisiran trial with history of statin intolerance We will need to get an updated echo to follow this The patient reports CCS class III symptoms of angina with exertion, progressive in nature.  He has had multiple coronary events requiring multivessel stenting over time.  I think he is at high risk of progressive disease and I reviewed his cardiac catheterization films from 2017 when he most recently underwent PCI of the left circumflex.  He had previously undergone successful PCI of the LAD.  He has small vessel disease involving the posterolateral bifurcation in the apical LAD as well as a severe stenosis of a small diagonal branch.  Even with his small vessel disease, he had no angina until the last 6 months and now reports progressive symptoms that are lifestyle limiting.  I have recommended definitive evaluation with cardiac catheterization and possible PCI. I have  reviewed the risks, indications, and alternatives to cardiac catheterization, possible angioplasty, and stenting with the patient. Risks include but are not limited to  bleeding, infection, vascular injury, stroke, myocardial infection, arrhythmia, kidney injury, radiation-related injury in the case of prolonged fluoroscopy use, emergency cardiac surgery, and death. The patient understands the risks of serious complication is 1-2 in 3500 with diagnostic cardiac cath and 1-2% or less with angioplasty/stenting.        Shared Decision Making/Informed Consent The risks [stroke (1 in 1000), death (1 in 1000), kidney failure [usually temporary] (1 in 500), bleeding (1 in 200), allergic reaction [possibly serious] (1 in 200)], benefits (diagnostic support and management of coronary artery disease) and alternatives of a cardiac catheterization were discussed in detail with Austin [name] and he is willing to proceed.    Medication Adjustments/Labs and Tests Ordered: Current medicines are reviewed at length with the patient today.  Concerns regarding medicines are outlined above.  Orders Placed This Encounter  Procedures   Basic Metabolic Panel (BMET)   CBC   EKG 12-Lead   No orders of the defined types were placed in this encounter.   Patient Instructions  Medication Instructions:   *If you need a refill on your cardiac medications before your next appointment, please call your pharmacy*   Lab Work: CBC AND BMET If you have labs (blood work) drawn today and your tests are completely normal, you will receive your results only by: [place] (if you have MyChart) OR A paper copy in the mail If you have any lab test that is abnormal or we need to change your treatment, we will call you to review the results.   Testing/Procedures:  [facility] OFFICE [place], [place] [address] Dept:  [phone] Loc: [phone]  Austin [name]  03/21/2022  You are scheduled for a Cardiac Catheterization on Thursday, August 3 with Dr. Sherren Mocha.  1. Please arrive at the Main Entrance A at [facility]: [place], [place] [address] at 7:00 AM (This time is two hours before your procedure to ensure your preparation). Free valet parking service is available.   Special note: Every effort is made to have your procedure done on time. Please understand that emergencies sometimes delay scheduled procedures.  2. Diet: Do not eat solid foods after midnight.  You may have clear liquids until 5 AM upon the day of the procedure.  3. Labs: You will need to have blood drawn on 03/22/2022.  [name]. Medication instructions in preparation for your procedure:   Contrast Allergy: No  On the morning of your procedure, take Aspirin and Plavix/Clopidogrel and any morning medicines NOT listed above.  You may use sips of water.  5. Plan to go home the same day, you will only stay overnight if medically necessary. 6. You MUST have a responsible adult to drive you home. 7. An adult MUST be with you the first 24 hours after you arrive home. 8. Bring a current list of your medications, and the last time and date medication taken. 9. Bring ID and current insurance cards. 10.Please wear clothes that are easy to get on and off and wear slip-on shoes.  Thank you for allowing [place] to care for you!   -- [facility]    Follow-Up: At [facility], you and your health needs are our priority.  As part of our continuing mission to provide you with exceptional heart care, we have created designated Provider Care Teams.  These Care Teams include your primary Cardiologist (physician) and Advanced  Practice Providers (APPs -  Physician Assistants and Nurse Practitioners) who all work together to provide you with the care you need, when you need it.  We recommend  signing up for the patient portal called "MyChart".  Sign up information is provided on this After Visit Summary.  MyChart is used to connect with patients for Virtual Visits (Telemedicine).  Patients are able to view lab/test results, encounter notes, upcoming appointments, etc.  Non-urgent messages can be sent to your provider as well.   To learn more about what you can do with MyChart, go to [email or web address].     Important Information About Sugar         Signed, Sherren Mocha, MD  03/21/2022 5:30 PM    [place]

## 2022-03-21 NOTE — Progress Notes (Signed)
Cardiology Office Note:    Date:  03/21/2022   ID:  Austin [name], DOB Aug 09, 1946, MRN [id number]  PCP:  Binnie Rail, MD   [facility] Cardiologist:  Sherren Mocha, MD Cardiology APP:  Sharmon Revere     Referring MD: Binnie Rail, MD   Chief Complaint  Patient presents with   Chest Pain    History of Present Illness:    Austin [name] is a 76 y.o. male with a hx of: Coronary artery disease S/p Lat MI in 2012 tx with BMS to LCx PCI 4/17: DES to prox LAD PCI 7/17: DES to OM1 Residual dz: severe small vessel dz in dist PDA, Dx branches of LAD, apical LAD >> med Rx Myoview 12/19: ant-lat infarct, no ischemia Follicular lymphoma, Gr I-II (Dr. Gloriann Loan, [facility]) Hypertension Hyperlipidemia Austin [name] 4 Trial participant Hx of TIA Chest pain Aortic atherosclerosis   He is here with his wife today. He has been more active because his wife had knee replacement surgery.  The patient reports symptoms of left chest tightness and sharp pains with physical activity.  He states that a year ago he could do what ever he wanted without any chest pain or pressure.  About 6 months ago he began to feel some discomfort in the chest, associated with physical activity.  Sometimes his symptoms resolve if he continues to walk.  However, his symptoms have been more noticeable over the last 1 to 2 months.  He oftentimes has to stop his activities and he is unable to exercise due to concerns about chest discomfort.  His symptoms predictably stop with rest.  He denies shortness of breath, heart palpitations, lightheadedness, orthopnea, or PND.  Past Medical History:  Diagnosis Date   Allergy    Arthritis    CAP (community acquired pneumonia)    Cataract    bilateral   Coronary artery disease    03/13/2016 DES to first OM, EF 55% // Myoview 12/19: EF 55, ant-lat infarction, no significant ischemia, Low Risk   Heart murmur    "outgrew it" (03/13/2016)   Hematuria     HTN (hypertension)    Hx of cardiovascular stress test    ETT-Myoview (10/15): Nondiagnostic EKG changes, anterolateral and inferolateral scar, no ischemia, EF 48%, low risk   Hyperlipemia    Hyperlipidemia    NMR Lipoprofile 2008: LDL 168 ( 2410/ 1826), HDL 41, TG 71. LDL goal = <100, ideally <  70. Framingham Study LDL goal = < 130.   Kidney stones    "passed them"; Dr. Terance Hart (03/13/2016)   Myocardial infarction [facility]) 2001   Non Hodgkin's lymphoma ([place])    [facility]   Pleural effusion    TIA (transient ischemic attack) 1980s   "I was exercising when I had it"    Past Surgical History:  Procedure Laterality Date   CARDIAC CATHETERIZATION N/A 12/22/2015   Procedure: Left Heart Cath and Coronary Angiography;  Surgeon: Sherren Mocha, MD;  Location: [place] [facility];  Service: Cardiovascular;  Laterality: N/A;   CARDIAC CATHETERIZATION N/A 12/22/2015   Procedure: Coronary Stent Intervention;  Surgeon: Sherren Mocha, MD;  Location: [place] [facility];  Service: Cardiovascular;  Laterality: N/A;   CARDIAC CATHETERIZATION N/A 12/22/2015   Procedure: Intravascular Pressure Wire/FFR Study;  Surgeon: Sherren Mocha, MD;  Location: [place] [facility];  Service: Cardiovascular;  Laterality: N/A;   CARDIAC CATHETERIZATION N/A 03/13/2016   Procedure: Left Heart Cath and Coronary  Angiography;  Surgeon: Sherren Mocha, MD;  Location: [place] [facility];  Service: Cardiovascular;  Laterality: N/A;   COLONOSCOPY  2002   CORONARY ANGIOPLASTY WITH STENT PLACEMENT  ~ 2013   "1 stent"   ESOPHAGOGASTRODUODENOSCOPY (EGD) WITH ESOPHAGEAL DILATION  2002   KNEE ARTHROSCOPY Left 1990s    Current Medications: Current Meds  Medication Sig   Alcohol Swabs (B-D SINGLE USE SWABS REGULAR) PADS 1 each by Does not apply route as needed.   amLODipine (NORVASC) 10 MG tablet Take 1 tablet (10 mg total) by mouth daily. Please keep upcoming appointment for future refills. Thank you.   aspirin 81 MG  tablet Take 1 tablet (81 mg total) by mouth daily.   chlorhexidine (PERIDEX) 0.12 % solution SMARTSIG:By Mouth   clopidogrel (PLAVIX) 75 MG tablet TAKE 1 TABLET EVERY DAY   dorzolamide-timolol (COSOPT) 22.3-6.8 MG/ML ophthalmic solution Place 1 drop into both eyes 2 (two) times daily.    latanoprost (XALATAN) 0.005 % ophthalmic solution Place 1 drop into both eyes at bedtime.   nitroGLYCERIN (NITROSTAT) 0.4 MG SL tablet PLACE 1 TABLET UNDER THE TONGUE AS NEEDED FOR CHEST PAIN   Study - Austin [name] 4 - inclisiran 300 mg/1.19m or placebo SQ injection (PI-Stuckey) Inject 1.5 mLs (300 mg total) into the skin every 6 (six) months.     Allergies:   Zetia [ezetimibe], Crestor [rosuvastatin], Isosorbide, Isosorbide mononitrate [isosorbide nitrate], Pravachol [pravastatin sodium], and Silver   Social History   Socioeconomic History   Marital status: Married    Spouse name: Not on file   Number of children: 3   Years of education: Not on file   Highest education level: Not on file  Occupational History   Occupation: retired pHigher education careers adviser Tobacco Use   Smoking status: Never   Smokeless tobacco: Never  Vaping Use   Vaping Use: Never used  Substance and Sexual Activity   Alcohol use: No    Comment: drank some in my 20's"   Drug use: No   Sexual activity: Yes  Other Topics Concern   Not on file  Social History Narrative   Not on file   Social Determinants of Health   Financial Resource Strain: Low Risk  (10/10/2021)   Overall Financial Resource Strain (CARDIA)    Difficulty of Paying Living Expenses: Not hard at all  Food Insecurity: No Food Insecurity (10/10/2021)   Hunger Vital Sign    Worried About Running Out of Food in the Last Year: Never true    RWestonin the Last Year: Never true  Transportation Needs: No Transportation Needs (10/10/2021)   PRAPARE - THydrologist(Medical): No    Lack of Transportation (Non-Medical): No  Physical Activity:  Insufficiently Active (10/10/2021)   Exercise Vital Sign    Days of Exercise per Week: 3 days    Minutes of Exercise per Session: 40 min  Stress: No Stress Concern Present (10/10/2021)   FFranklin   Feeling of Stress : Not at all  Social Connections: [place](10/10/2021)   Social Connection and Isolation Panel [NHANES]    Frequency of Communication with Friends and Family: More than three times a week    Frequency of Social Gatherings with Friends and Family: More than three times a week    Attends Religious Services: More than 4 times per year    Active Member of [employer] Organizations: Yes  Attends Music therapist: More than 4 times per year    Marital Status: Married     Family History: The patient's family history includes Cancer in his mother and sister; Diabetes in his mother; Hyperlipidemia in his mother; Prostate cancer in his father; Stroke in his father.  ROS:   Please see the history of present illness.    All other systems reviewed and are negative.  EKGs/Labs/Other Studies Reviewed:    The following studies were reviewed today: The patient's cardiac catheterization films from 2017 are personally reviewed  EKG:  EKG is ordered today.  The ekg ordered today demonstrates sinus bradycardia 59 bpm, inferoposterior infarct age undetermined, ST and T wave abnormality consider lateral ischemia  Recent Labs: 06/21/2021: ALT 16; Hemoglobin 13.4; Platelets 266.0; Potassium 4.2; Sodium 138; TSH 2.66 08/12/2021: BUN 17; Creatinine, Ser 1.28  Recent Lipid Panel    Component Value Date/Time   CHOL 221 (H) 04/16/2020 1059   TRIG 108 04/16/2020 1059   HDL 49 04/16/2020 1059   CHOLHDL 4.5 04/16/2020 1059   VLDL 18.4 02/20/2018 1157   LDLCALC 150 (H) 04/16/2020 1059   LDLDIRECT 173.2 07/21/2013 0809     Risk Assessment/Calculations:           Physical Exam:    VS:  BP 138/82    Pulse (!) 59   Ht '6\' 1"'$  (1.854 m)   Wt 194 lb 9.6 oz (88.3 kg)   SpO2 96%   BMI 25.67 kg/m     Wt Readings from Last 3 Encounters:  03/21/22 194 lb 9.6 oz (88.3 kg)  02/03/22 192 lb (87.1 kg)  11/09/21 201 lb 6.4 oz (91.4 kg)     GEN:  Well nourished, well developed in no acute distress HEENT: Normal NECK: No JVD; No carotid bruits LYMPHATICS: No lymphadenopathy CARDIAC: RRR, no murmurs, rubs, gallops RESPIRATORY:  Clear to auscultation without rales, wheezing or rhonchi  ABDOMEN: Soft, non-tender, non-distended MUSCULOSKELETAL:  No edema; No deformity  SKIN: Warm and dry NEUROLOGIC:  Alert and oriented x 3 PSYCHIATRIC:  Normal affect   ASSESSMENT:    1. Essential hypertension   2. Mixed hyperlipidemia   3. Dilated aortic root ([place])   4. Coronary artery disease of native artery of native heart with stable angina pectoris ([place])   5. Pre-procedural cardiovascular examination    PLAN:    In order of problems listed above:  Blood pressure controlled on amlodipine.  Continue the same. Treated through the [facility] for inclisiran trial with history of statin intolerance We will need to get an updated echo to follow this The patient reports CCS class III symptoms of angina with exertion, progressive in nature.  He has had multiple coronary events requiring multivessel stenting over time.  I think he is at high risk of progressive disease and I reviewed his cardiac catheterization films from 2017 when he most recently underwent PCI of the left circumflex.  He had previously undergone successful PCI of the LAD.  He has small vessel disease involving the posterolateral bifurcation in the apical LAD as well as a severe stenosis of a small diagonal branch.  Even with his small vessel disease, he had no angina until the last 6 months and now reports progressive symptoms that are lifestyle limiting.  I have recommended definitive evaluation with cardiac catheterization and possible PCI. I have  reviewed the risks, indications, and alternatives to cardiac catheterization, possible angioplasty, and stenting with the patient. Risks include but are not limited to  bleeding, infection, vascular injury, stroke, myocardial infection, arrhythmia, kidney injury, radiation-related injury in the case of prolonged fluoroscopy use, emergency cardiac surgery, and death. The patient understands the risks of serious complication is 1-2 in 1245 with diagnostic cardiac cath and 1-2% or less with angioplasty/stenting.        Shared Decision Making/Informed Consent The risks [stroke (1 in 1000), death (1 in 1000), kidney failure [usually temporary] (1 in 500), bleeding (1 in 200), allergic reaction [possibly serious] (1 in 200)], benefits (diagnostic support and management of coronary artery disease) and alternatives of a cardiac catheterization were discussed in detail with Mr. Couper and he is willing to proceed.    Medication Adjustments/Labs and Tests Ordered: Current medicines are reviewed at length with the patient today.  Concerns regarding medicines are outlined above.  Orders Placed This Encounter  Procedures   Basic Metabolic Panel (BMET)   CBC   EKG 12-Lead   No orders of the defined types were placed in this encounter.   Patient Instructions  Medication Instructions:   *If you need a refill on your cardiac medications before your next appointment, please call your pharmacy*   Lab Work: CBC AND BMET If you have labs (blood work) drawn today and your tests are completely normal, you will receive your results only by: [place] (if you have MyChart) OR A paper copy in the mail If you have any lab test that is abnormal or we need to change your treatment, we will call you to review the results.   Testing/Procedures:  [facility] OFFICE [place], [place] [address] Dept:  [phone] Loc: [phone]  Austin [name]  03/21/2022  You are scheduled for a Cardiac Catheterization on Thursday, August 3 with Dr. Sherren Mocha.  1. Please arrive at the Main Entrance A at [facility]: [place], [place] [address] at 7:00 AM (This time is two hours before your procedure to ensure your preparation). Free valet parking service is available.   Special note: Every effort is made to have your procedure done on time. Please understand that emergencies sometimes delay scheduled procedures.  2. Diet: Do not eat solid foods after midnight.  You may have clear liquids until 5 AM upon the day of the procedure.  3. Labs: You will need to have blood drawn on 03/22/2022.  4. Medication instructions in preparation for your procedure:   Contrast Allergy: No  On the morning of your procedure, take Aspirin and Plavix/Clopidogrel and any morning medicines NOT listed above.  You may use sips of water.  5. Plan to go home the same day, you will only stay overnight if medically necessary. 6. You MUST have a responsible adult to drive you home. 7. An adult MUST be with you the first 24 hours after you arrive home. 8. Bring a current list of your medications, and the last time and date medication taken. 9. Bring ID and current insurance cards. 10.Please wear clothes that are easy to get on and off and wear slip-on shoes.  Thank you for allowing [place] to care for you!   -- [facility]    Follow-Up: At [facility], you and your health needs are our priority.  As part of our continuing mission to provide you with exceptional heart care, we have created designated Provider Care Teams.  These Care Teams include your primary Cardiologist (physician) and Advanced  Practice Providers (APPs -  Physician Assistants and Nurse Practitioners) who all work together to provide you with the care you need, when you need it.  We recommend  signing up for the patient portal called "MyChart".  Sign up information is provided on this After Visit Summary.  MyChart is used to connect with patients for Virtual Visits (Telemedicine).  Patients are able to view lab/test results, encounter notes, upcoming appointments, etc.  Non-urgent messages can be sent to your provider as well.   To learn more about what you can do with MyChart, go to [email or web address].     Important Information About Sugar         Signed, Sherren Mocha, MD  03/21/2022 5:30 PM    [place]

## 2022-03-22 LAB — CBC
Hematocrit: 40.4 % (ref 37.5–51.0)
Hemoglobin: 13.7 g/dL (ref 13.0–17.7)
MCH: 30 pg (ref 26.6–33.0)
MCHC: 33.9 g/dL (ref 31.5–35.7)
MCV: 89 fL (ref 79–97)
Platelets: 293 10*3/uL (ref 150–450)
RBC: 4.56 x10E6/uL (ref 4.14–5.80)
RDW: 12.6 % (ref 11.6–15.4)
WBC: 4.4 10*3/uL (ref 3.4–10.8)

## 2022-03-22 LAB — BASIC METABOLIC PANEL
BUN/Creatinine Ratio: 15 (ref 10–24)
BUN: 16 mg/dL (ref 8–27)
CO2: 27 mmol/L (ref 20–29)
Calcium: 10.3 mg/dL — ABNORMAL HIGH (ref 8.6–10.2)
Chloride: 100 mmol/L (ref 96–106)
Creatinine, Ser: 1.07 mg/dL (ref 0.76–1.27)
Glucose: 100 mg/dL — ABNORMAL HIGH (ref 70–99)
Potassium: 4.6 mmol/L (ref 3.5–5.2)
Sodium: 139 mmol/L (ref 134–144)
eGFR: 71 mL/min/{1.73_m2} (ref >59)

## 2022-03-28 ENCOUNTER — Telehealth: Payer: Self-pay | Admitting: *Deleted

## 2022-03-28 NOTE — Telephone Encounter (Signed)
Cardiac Catheterization scheduled at [facility] for: Thursday March 30, 2022 9 AM Arrival time and place: [facility] Main Entrance A at: 7 AM   Nothing to eat after midnight prior to procedure, clear liquids until 5 AM day of procedure.  Medication instructions: -Usual morning medications can be taken with sips of water including aspirin 81 mg and Plavix 75 mg  Confirmed patient has responsible adult to drive home post procedure and be with patient first 24 hours after arriving home.  Patient reports no new symptoms concerning for COVID-19 in the past 10 days.  Reviewed procedure instructions with patient.

## 2022-03-30 ENCOUNTER — Encounter (HOSPITAL_COMMUNITY)
Admission: RE | Disposition: A | Discharge status: Home / Self Care | Payer: Self-pay | Source: Home / Self Care | Attending: Cardiovascular Disease

## 2022-03-30 ENCOUNTER — Ambulatory Visit (HOSPITAL_COMMUNITY)
Admission: RE | Admit: 2022-03-30 | Discharge: 2022-03-30 | Disposition: A | Discharge status: Home / Self Care | Payer: Medicare HMO | Attending: Cardiovascular Disease | Admitting: Cardiovascular Disease

## 2022-03-30 ENCOUNTER — Other Ambulatory Visit: Payer: Self-pay

## 2022-03-30 DIAGNOSIS — E782 Mixed hyperlipidemia: Secondary | ICD-10-CM | POA: Insufficient documentation

## 2022-03-30 DIAGNOSIS — I2582 Chronic total occlusion of coronary artery: Secondary | ICD-10-CM | POA: Diagnosis not present

## 2022-03-30 DIAGNOSIS — Z955 Presence of coronary angioplasty implant and graft: Secondary | ICD-10-CM | POA: Insufficient documentation

## 2022-03-30 DIAGNOSIS — I10 Essential (primary) hypertension: Secondary | ICD-10-CM | POA: Insufficient documentation

## 2022-03-30 DIAGNOSIS — I25119 Atherosclerotic heart disease of native coronary artery with unspecified angina pectoris: Secondary | ICD-10-CM | POA: Diagnosis not present

## 2022-03-30 DIAGNOSIS — Z7982 Long term (current) use of aspirin: Secondary | ICD-10-CM | POA: Diagnosis not present

## 2022-03-30 DIAGNOSIS — I252 Old myocardial infarction: Secondary | ICD-10-CM | POA: Diagnosis not present

## 2022-03-30 DIAGNOSIS — I77819 Aortic ectasia, unspecified site: Secondary | ICD-10-CM | POA: Diagnosis not present

## 2022-03-30 DIAGNOSIS — Z7902 Long term (current) use of antithrombotics/antiplatelets: Secondary | ICD-10-CM | POA: Insufficient documentation

## 2022-03-30 HISTORY — PX: CORONARY STENT INTERVENTION: CATH118234

## 2022-03-30 HISTORY — PX: LEFT HEART CATH AND CORONARY ANGIOGRAPHY: CATH118249

## 2022-03-30 LAB — POCT ACTIVATED CLOTTING TIME: Activated Clotting Time: 293 seconds

## 2022-03-30 SURGERY — LEFT HEART CATH AND CORONARY ANGIOGRAPHY
Anesthesia: LOCAL

## 2022-03-30 MED ORDER — ONDANSETRON HCL 4 MG/2ML IJ SOLN: 4.0000 mg | Freq: Four times a day (QID) | INTRAMUSCULAR | Status: DC | PRN

## 2022-03-30 MED ORDER — SODIUM CHLORIDE 0.9 % IV SOLN: 250.0000 mL | INTRAVENOUS | Status: DC | PRN

## 2022-03-30 MED ORDER — SODIUM CHLORIDE 0.9% FLUSH: 3.0000 mL | INTRAVENOUS | Status: DC | PRN

## 2022-03-30 MED ORDER — LIDOCAINE HCL (PF) 1 % IJ SOLN
INTRAMUSCULAR | Status: AC
  Filled 2022-03-30: qty 30

## 2022-03-30 MED ORDER — FENTANYL CITRATE (PF) 100 MCG/2ML IJ SOLN
INTRAMUSCULAR | Status: DC | PRN
  Administered 2022-03-30: 25 ug via INTRAVENOUS

## 2022-03-30 MED ORDER — SODIUM CHLORIDE 0.9% FLUSH
3.0000 mL | Freq: Two times a day (BID) | INTRAVENOUS | Status: DC
Start: 2022-03-30 — End: 2022-03-31

## 2022-03-30 MED ORDER — SODIUM CHLORIDE 0.9 % WEIGHT BASED INFUSION: 3.0000 mL/kg/h | INTRAVENOUS | Status: AC

## 2022-03-30 MED ORDER — LIDOCAINE HCL (PF) 1 % IJ SOLN
INTRAMUSCULAR | Status: DC | PRN
  Administered 2022-03-30: 2 mL

## 2022-03-30 MED ORDER — SODIUM CHLORIDE 0.9 % IV SOLN: INTRAVENOUS | Status: AC

## 2022-03-30 MED ORDER — HEPARIN SODIUM (PORCINE) 1000 UNIT/ML IJ SOLN
INTRAMUSCULAR | Status: AC
  Filled 2022-03-30: qty 10

## 2022-03-30 MED ORDER — CLOPIDOGREL BISULFATE 75 MG PO TABS: 75.0000 mg | ORAL_TABLET | ORAL | Status: DC

## 2022-03-30 MED ORDER — SODIUM CHLORIDE 0.9 % WEIGHT BASED INFUSION: 1.0000 mL/kg/h | INTRAVENOUS | Status: DC

## 2022-03-30 MED ORDER — SODIUM CHLORIDE 0.9% FLUSH: 3.0000 mL | Freq: Two times a day (BID) | INTRAVENOUS | Status: DC

## 2022-03-30 MED ORDER — MIDAZOLAM HCL 2 MG/2ML IJ SOLN
INTRAMUSCULAR | Status: AC
  Filled 2022-03-30: qty 2

## 2022-03-30 MED ORDER — AMLODIPINE BESYLATE 5 MG PO TABS
10.0000 mg | ORAL_TABLET | Freq: Once | ORAL | Status: AC
Start: 2022-03-30 — End: 2022-03-30
  Administered 2022-03-30: 10 mg via ORAL
  Filled 2022-03-30: qty 2

## 2022-03-30 MED ORDER — HYDRALAZINE HCL 20 MG/ML IJ SOLN: 10.0000 mg | INTRAMUSCULAR | Status: DC | PRN

## 2022-03-30 MED ORDER — IOHEXOL 350 MG/ML SOLN
INTRAVENOUS | Status: DC | PRN
  Administered 2022-03-30: 110 mL

## 2022-03-30 MED ORDER — HEPARIN (PORCINE) IN NACL 1000-0.9 UT/500ML-% IV SOLN
INTRAVENOUS | Status: AC
  Filled 2022-03-30: qty 1000

## 2022-03-30 MED ORDER — VERAPAMIL HCL 2.5 MG/ML IV SOLN
INTRAVENOUS | Status: DC | PRN
  Administered 2022-03-30: 10 mL via INTRA_ARTERIAL

## 2022-03-30 MED ORDER — VERAPAMIL HCL 2.5 MG/ML IV SOLN
INTRAVENOUS | Status: AC
Start: 2022-03-30 — End: ?
  Filled 2022-03-30: qty 2

## 2022-03-30 MED ORDER — NITROGLYCERIN 1 MG/10 ML FOR IR/CATH LAB
INTRA_ARTERIAL | Status: AC
  Filled 2022-03-30: qty 10

## 2022-03-30 MED ORDER — HEPARIN SODIUM (PORCINE) 1000 UNIT/ML IJ SOLN
INTRAMUSCULAR | Status: DC | PRN
  Administered 2022-03-30: 5000 [IU] via INTRAVENOUS
  Administered 2022-03-30: 4000 [IU] via INTRAVENOUS

## 2022-03-30 MED ORDER — HEPARIN (PORCINE) IN NACL 1000-0.9 UT/500ML-% IV SOLN
INTRAVENOUS | Status: DC | PRN
  Administered 2022-03-30 (×2): 500 mL

## 2022-03-30 MED ORDER — LABETALOL HCL 5 MG/ML IV SOLN: 10.0000 mg | INTRAVENOUS | Status: DC | PRN

## 2022-03-30 MED ORDER — FENTANYL CITRATE (PF) 100 MCG/2ML IJ SOLN
INTRAMUSCULAR | Status: AC
  Filled 2022-03-30: qty 2

## 2022-03-30 MED ORDER — NITROGLYCERIN 1 MG/10 ML FOR IR/CATH LAB
INTRA_ARTERIAL | Status: DC | PRN
  Administered 2022-03-30: 200 ug via INTRACORONARY

## 2022-03-30 MED ORDER — MIDAZOLAM HCL 2 MG/2ML IJ SOLN
INTRAMUSCULAR | Status: DC | PRN
  Administered 2022-03-30: 2 mg via INTRAVENOUS

## 2022-03-30 MED ORDER — ASPIRIN 81 MG PO CHEW: 81.0000 mg | CHEWABLE_TABLET | ORAL | Status: DC

## 2022-03-30 MED ORDER — ACETAMINOPHEN 325 MG PO TABS: 650.0000 mg | ORAL_TABLET | ORAL | Status: DC | PRN

## 2022-03-30 SURGICAL SUPPLY — 19 items
BALL SAPPHIRE NC24 3.25X12 (BALLOONS) ×2
BALLN SAPPHIRE 2.5X12 (BALLOONS) ×2
BALLOON SAPPHIRE 2.5X12 (BALLOONS) IMPLANT
BALLOON SAPPHIRE NC24 3.25X12 (BALLOONS) IMPLANT
BAND ZEPHYR COMPRESS 30 LONG (HEMOSTASIS) ×1 IMPLANT
CATH 5FR JL3.5 JR4 ANG PIG MP (CATHETERS) ×1 IMPLANT
CATH LAUNCHER 6FR EBU3.5 (CATHETERS) ×1 IMPLANT
GLIDESHEATH SLEND SS 6F .021 (SHEATH) ×1 IMPLANT
GUIDEWIRE INQWIRE 1.5J.035X260 (WIRE) IMPLANT
INQWIRE 1.5J .035X260CM (WIRE) ×2
KIT ENCORE 26 ADVANTAGE (KITS) ×1 IMPLANT
KIT HEART LEFT (KITS) ×2 IMPLANT
PACK CARDIAC CATHETERIZATION (CUSTOM PROCEDURE TRAY) ×2 IMPLANT
STENT SYNERGY XD 3.0X16 (Permanent Stent) IMPLANT
SYNERGY XD 3.0X16 (Permanent Stent) ×2 IMPLANT
TRANSDUCER W/STOPCOCK (MISCELLANEOUS) ×2 IMPLANT
TUBING CIL FLEX 10 FLL-RA (TUBING) ×2 IMPLANT
VALVE GUARDIAN II ~~LOC~~ HEMO (MISCELLANEOUS) ×1 IMPLANT
WIRE COUGAR XT STRL 190CM (WIRE) ×1 IMPLANT

## 2022-03-30 NOTE — Interval H&P Note (Signed)
Cath Lab Visit (complete for each Cath Lab visit)  Clinical Evaluation Leading to the Procedure:   ACS: No.  Non-ACS:    Anginal Classification: CCS III  Anti-ischemic medical therapy: Minimal Therapy (1 class of medications)  Non-Invasive Test Results: No non-invasive testing performed  Prior CABG: No previous CABG      History and Physical Interval Note:  03/30/2022 12:46 PM  Austin [name]  has presented today for surgery, with the diagnosis of angina.  The various methods of treatment have been discussed with the patient and family. After consideration of risks, benefits and other options for treatment, the patient has consented to  Procedure(s): LEFT HEART CATH AND CORONARY ANGIOGRAPHY (N/A) as a surgical intervention.  The patient's history has been reviewed, patient examined, no change in status, stable for surgery.  I have reviewed the patient's chart and labs.  Questions were answered to the patient's satisfaction.     Sherren Mocha

## 2022-03-30 NOTE — Discharge Summary (Signed)
Discharge Summary for Same Day PCI   Patient ID: Austin [name] MRN: [id number]; DOB: 1946/02/01  Admit date: 03/30/2022 Discharge date: 03/31/2022  Primary Care Provider: Binnie Rail, MD  Primary Cardiologist: Sherren Mocha, MD  Primary Electrophysiologist:  None   Discharge Diagnoses    Active Problems:   Coronary artery disease involving native coronary artery of native heart with angina pectoris [facility]) HTN HLD   Diagnostic Studies/Procedures    Cardiac Catheterization 03/31/2022:  CORONARY STENT INTERVENTION  03/30/22  LEFT HEART CATH AND CORONARY ANGIOGRAPHY   Conclusion      Dist LAD lesion is 90% stenosed.   Mid Cx lesion is 100% stenosed.   Lat 1st Diag lesion is 90% stenosed.   2nd Diag lesion is 90% stenosed.   Ost RPDA to RPDA lesion is 50% stenosed.   RPDA lesion is 80% stenosed.   1st Mrg-3 lesion is 95% stenosed.   Prox RCA lesion is 40% stenosed.   Dist RCA lesion is 50% stenosed.   Non-stenotic 1st Mrg-2 lesion was previously treated.   Non-stenotic Prox LAD lesion was previously treated.   Non-stenotic 1st Mrg-1 lesion was previously treated.   A drug-eluting stent was successfully placed using a SYNERGY XD 3.0X16.   Post intervention, there is a 0% residual stenosis.   1.  Severe stenosis of the first OM branch of the circumflex (distal stent edge) treated successfully with PCI using a 3.0 x 16 mm Synergy DES 2.  Continued patency of the proximal LAD stent with no evidence of significant restenosis and stable apical LAD stenosis unchanged from the previous study 3.  Patent left main with no significant stenosis 4.  Patent but diffusely diseased RCA with severe stenosis involving multiple bifurcation areas in the distal branch vessels not suitable for PCI  Recommendations: Same-day PCI protocol if criteria met, dual antiplatelet therapy with aspirin and clopidogrel minimum of 12 months favor long-term therapy in this patient with multiple PCI  procedures     Diagnostic Dominance: Right  Intervention   History of Present Illness     Austin [name] is a 76 y.o. male with of: Coronary artery disease S/p Lat MI in 2012 tx with BMS to LCx PCI 4/17: DES to prox LAD PCI 7/17: DES to OM1 Residual dz: severe small vessel dz in dist PDA, Dx branches of LAD, apical LAD >> med Rx Myoview 12/19: ant-lat infarct, no ischemia Follicular lymphoma, Gr I-II (Dr. Gloriann Loan, [facility]) Hypertension Hyperlipidemia Austin [name] 4 Trial participant Hx of TIA Chest pain Aortic atherosclerosis   Most recently seen by Dr. Burt Knack 03/01/22. Noted 6 months hx of progressive angina with exertional activity, resolved with rest. However, more noticeable in past 1 to months. He oftentimes has to stop his activities and he is unable to exercise due to concerns about chest discomfort. Cardiac catheterization was arranged for further evaluation.  Hospital Course     The patient underwent cardiac cath as noted above with severe stenosis of the first OM branch of the circumflex (distal stent edge) treated successfully with PCI using a 3.0 x 16 mm Synergy DES. Plan for DAPT with ASA/Plavix for at least 12 but favor long term given multiple PCI. The patient was seen by cardiac rehab while in short stay. There were no observed complications post cath. Radial cath site was re-evaluated prior to discharge and found to be stable without any complications. Instructions/precautions regarding cath site care were given prior to discharge.  Austin [name] was seen  by Dr. Burt Knack and determined stable for discharge home. Follow up with our office has been arranged. Medications are listed below. Pertinent changes include None.   Cath/PCI Registry Performance & Quality Measures: Aspirin prescribed? - Yes ADP Receptor Inhibitor (Plavix/Clopidogrel, Brilinta/Ticagrelor or Effient/Prasugrel) prescribed (includes medically managed patients)? - Yes High Intensity Statin  (Lipitor 40-61m or Crestor 20-422m prescribed? - No - Statin intolerance / Austin [name] study For EF <40%, was ACEI/ARB prescribed? - Not Applicable (EF >/= 4099%For EF <40%, Aldosterone Antagonist (Spironolactone or Eplerenone) prescribed? - Not Applicable (EF >/= 4024%Cardiac Rehab Phase II ordered (Included Medically managed Patients)? - Yes   Discharge Vitals Blood pressure 136/78, pulse (!) 58, temperature 98.2 F (36.8 C), temperature source Oral, resp. rate 15, height '6\' 1"'  (1.854 m), weight 194 lb (88 kg), SpO2 99 %.  Filed Weights   03/30/22 0711  Weight: 194 lb (88 kg)    Last Labs & Radiologic Studies    _____________  CARDIAC CATHETERIZATION  Result Date: 03/30/2022   Dist LAD lesion is 90% stenosed.   Mid Cx lesion is 100% stenosed.   Lat 1st Diag lesion is 90% stenosed.   2nd Diag lesion is 90% stenosed.   Ost RPDA to RPDA lesion is 50% stenosed.   RPDA lesion is 80% stenosed.   1st Mrg-3 lesion is 95% stenosed.   Prox RCA lesion is 40% stenosed.   Dist RCA lesion is 50% stenosed.   Non-stenotic 1st Mrg-2 lesion was previously treated.   Non-stenotic Prox LAD lesion was previously treated.   Non-stenotic 1st Mrg-1 lesion was previously treated.   A drug-eluting stent was successfully placed using a SYNERGY XD 3.0X16.   Post intervention, there is a 0% residual stenosis. 1.  Severe stenosis of the first OM branch of the circumflex (distal stent edge) treated successfully with PCI using a 3.0 x 16 mm Synergy DES 2.  Continued patency of the proximal LAD stent with no evidence of significant restenosis and stable apical LAD stenosis unchanged from the previous study 3.  Patent left main with no significant stenosis 4.  Patent but diffusely diseased RCA with severe stenosis involving multiple bifurcation areas in the distal branch vessels not suitable for PCI Recommendations: Same-day PCI protocol if criteria met, dual antiplatelet therapy with aspirin and clopidogrel minimum of 12 months  favor long-term therapy in this patient with multiple PCI procedures    Disposition   Pt is being discharged home today in good condition.  Follow-up Plans & Appointments     Discharge Instructions     AMB Referral to Cardiac Rehabilitation - Phase II   Complete by: As directed    Diagnosis: Coronary Stents   After initial evaluation and assessments completed: Virtual Based Care may be provided alone or in conjunction with Phase 2 Cardiac Rehab based on patient barriers.: Yes   Amb Referral to Cardiac Rehabilitation   Complete by: As directed    Diagnosis: Coronary Stents   After initial evaluation and assessments completed: Virtual Based Care may be provided alone or in conjunction with Phase 2 Cardiac Rehab based on patient barriers.: Yes        Discharge Medications   Allergies as of 03/30/2022       Reactions   Zetia [ezetimibe] Shortness Of Breath, Other (See Comments)   Chest pain   Crestor [rosuvastatin] Other (See Comments)   Myalgias   Isosorbide Other (See Comments)   Headache, throat soreness and hoarse   Pravachol [pravastatin Sodium] Other (  See Comments)   Myalgias   Silver Rash        Medication List     TAKE these medications    amLODipine 10 MG tablet Commonly known as: NORVASC Take 1 tablet (10 mg total) by mouth daily. Please keep upcoming appointment for future refills. Thank you.   aspirin 81 MG tablet Take 1 tablet (81 mg total) by mouth daily.   B-D SINGLE USE SWABS REGULAR Pads 1 each by Does not apply route as needed.   Bayer Back & Body 500-32.5 MG Tabs Generic drug: Aspirin-Caffeine Take 1 tablet by mouth daily as needed (pain).   clopidogrel 75 MG tablet Commonly known as: PLAVIX TAKE 1 TABLET EVERY DAY   dorzolamide-timolol 22.3-6.8 MG/ML ophthalmic solution Commonly known as: COSOPT Place 1 drop into both eyes 2 (two) times daily.   latanoprost 0.005 % ophthalmic solution Commonly known as: XALATAN Place 1 drop into  both eyes at bedtime.   Muscle Rub 10-15 % Crea Apply 1 Application topically as needed for muscle pain.   nitroGLYCERIN 0.4 MG SL tablet Commonly known as: NITROSTAT PLACE 1 TABLET UNDER THE TONGUE AS NEEDED FOR CHEST PAIN   Austin [name] 4 inclisiran or placebo 300 mg/1.5 mL SQ injection Inject 1.5 mLs (300 mg total) into the skin every 6 (six) months.   OVER THE COUNTER MEDICATION Take 2 tablets by mouth daily with lunch. Prostate Strong   REFRESH OP Place 1 drop into both eyes daily as needed (dry eyes).   VITAMIN C PO Take 1 tablet by mouth 2 (two) times a week.           Allergies Allergies  Allergen Reactions   Zetia [Ezetimibe] Shortness Of Breath and Other (See Comments)    Chest pain   Crestor [Rosuvastatin] Other (See Comments)    Myalgias   Isosorbide Other (See Comments)    Headache, throat soreness and hoarse   Pravachol [Pravastatin Sodium] Other (See Comments)    Myalgias   Silver Rash    Outstanding Labs/Studies   None  Duration of Discharge Encounter   Greater than 30 minutes including physician time.  Jarrett Soho, PA 03/31/2022, 10:53 AM

## 2022-03-30 NOTE — Progress Notes (Signed)
CARDIAC REHAB PHASE I     Post stent teaching including risk factors, exercise guidelines, heart healthy diet,site care, antiplatelet therapy importance, restrictions, and CRP2. All questions and concerns addressed. Will refer to [facility] for CRP2. Pt and wife will discuss, he thinks we could benefit from CRP2. Plan for discharge later today.  [id number]  Vanessa Barbara, RN BSN 03/30/2022 3:18 PM

## 2022-03-31 ENCOUNTER — Encounter (HOSPITAL_COMMUNITY): Payer: Self-pay | Admitting: Cardiovascular Disease

## 2022-04-04 ENCOUNTER — Encounter: Payer: Self-pay | Admitting: Cardiovascular Disease

## 2022-04-04 ENCOUNTER — Ambulatory Visit: Payer: Medicare HMO | Admitting: Cardiovascular Disease

## 2022-04-04 VITALS — BP 140/78 | HR 56 | Ht 72.0 in | Wt 194.8 lb

## 2022-04-04 DIAGNOSIS — Z955 Presence of coronary angioplasty implant and graft: Secondary | ICD-10-CM

## 2022-04-04 DIAGNOSIS — I25118 Atherosclerotic heart disease of native coronary artery with other forms of angina pectoris: Secondary | ICD-10-CM

## 2022-04-04 NOTE — Progress Notes (Signed)
Cardiology Office Note:    Date:  04/04/2022   ID:  Austin [name], DOB Jan 24, 1946, MRN [id number]  PCP:  Austin Rail, MD   [place] Providers Cardiologist:  Austin Mocha, MD Cardiology APP:  Austin [name]     Referring MD: Austin Rail, MD   Chief Complaint  Patient presents with   Coronary Artery Disease    History of Present Illness:    Austin [name] is a 76 y.o. male with a hx of: Coronary artery disease S/p Lat MI in 2012 tx with BMS to LCx PCI 4/17: DES to prox LAD PCI 7/17: DES to OM1 Residual dz: severe small vessel dz in dist PDA, Dx branches of LAD, apical LAD >> med Rx Myoview 12/19: ant-lat infarct, no ischemia Recurrent CCS class III angina in 2023 >> severe left circumflex stenosis treated with DES Follicular lymphoma, Gr I-II (Dr. Gloriann [name], [facility]) Hypertension Hyperlipidemia Austin [name] 4 Trial participant Hx of TIA Chest pain Aortic atherosclerosis   The patient is here with his wife today.  He underwent PCI last week for treatment of severe stenosis at the distal edge of his circumflex stent.  His other coronary anatomy remained relatively stable with diffuse small vessel distal disease and continued patency of his previously implanted LAD stent.  The patient's procedure was uncomplicated and he underwent same-day discharge per protocol.  He has had no angina since the procedure was performed.  He denies any problems with his hand or wrist.  He denies shortness of breath, edema, or palpitations.  He has been compliant with aspirin and clopidogrel.  Past Medical History:  Diagnosis Date   Allergy    Arthritis    CAP (community acquired pneumonia)    Cataract    bilateral   Coronary artery disease    03/13/2016 DES to first OM, EF 55% // Myoview 12/19: EF 55, ant-lat infarction, no significant ischemia, Low Risk   Heart murmur    "outgrew it" (03/13/2016)   Hematuria    HTN (hypertension)    Hx of cardiovascular stress  test    ETT-Myoview (10/15): Nondiagnostic EKG changes, anterolateral and inferolateral scar, no ischemia, EF 48%, low risk   Hyperlipemia    Hyperlipidemia    NMR Lipoprofile 2008: LDL 168 ( 2410/ 1826), HDL 41, TG 71. LDL goal = <100, ideally <  70. Framingham Study LDL goal = < 130.   Kidney stones    "passed them"; Dr. Terance [name] (03/13/2016)   Myocardial infarction [facility]) 2001   Non Hodgkin's [facility])    [facility]   Pleural effusion    TIA (transient ischemic attack) 1980s   "I was exercising when I had it"    Past Surgical History:  Procedure Laterality Date   CARDIAC CATHETERIZATION N/A 12/22/2015   Procedure: Left Heart Cath and Coronary Angiography;  Surgeon: Austin Mocha, MD;  Location: [place] CV [facility];  Service: Cardiovascular;  Laterality: N/A;   CARDIAC CATHETERIZATION N/A 12/22/2015   Procedure: Coronary Stent Intervention;  Surgeon: Austin Mocha, MD;  Location: [place] CV [facility];  Service: Cardiovascular;  Laterality: N/A;   CARDIAC CATHETERIZATION N/A 12/22/2015   Procedure: Intravascular Pressure Wire/FFR Study;  Surgeon: Austin Mocha, MD;  Location: [place] CV LAB;  Service: Cardiovascular;  Laterality: N/A;   CARDIAC CATHETERIZATION N/A 03/13/2016   Procedure: Left Heart Cath and Coronary Angiography;  Surgeon: Austin Mocha, MD;  Location: [place] CV [facility];  Service: Cardiovascular;  Laterality: N/A;  COLONOSCOPY  2002   CORONARY ANGIOPLASTY WITH STENT PLACEMENT  ~ 2013   "1 stent"   CORONARY STENT INTERVENTION N/A 03/30/2022   Procedure: CORONARY STENT INTERVENTION;  Surgeon: Austin Mocha, MD;  Location: [place] CV LAB;  Service: Cardiovascular;  Laterality: N/A;   ESOPHAGOGASTRODUODENOSCOPY (EGD) WITH ESOPHAGEAL DILATION  2002   KNEE ARTHROSCOPY Left 1990s   LEFT HEART CATH AND CORONARY ANGIOGRAPHY N/A 03/30/2022   Procedure: LEFT HEART CATH AND CORONARY ANGIOGRAPHY;  Surgeon: Austin Mocha, MD;  Location: [place] [facility];   Service: Cardiovascular;  Laterality: N/A;    Current Medications: Current Meds  Medication Sig   Alcohol Swabs (B-D SINGLE USE SWABS REGULAR) PADS 1 each by Does not apply route as needed.   amLODipine (NORVASC) 10 MG tablet Take 1 tablet (10 mg total) by mouth daily. Please keep upcoming appointment for future refills. Thank you.   Ascorbic Acid (VITAMIN C PO) Take 1 tablet by mouth 2 (two) times a week.   aspirin 81 MG tablet Take 1 tablet (81 mg total) by mouth daily.   Aspirin-Caffeine (BAYER BACK & BODY) 500-32.5 MG TABS Take 1 tablet by mouth daily as needed (pain).   clopidogrel (PLAVIX) 75 MG tablet TAKE 1 TABLET EVERY DAY   dorzolamide-timolol (COSOPT) 22.3-6.8 MG/ML ophthalmic solution Place 1 drop into both eyes 2 (two) times daily.    latanoprost (XALATAN) 0.005 % ophthalmic solution Place 1 drop into both eyes at bedtime.   Menthol-Methyl Salicylate (MUSCLE RUB) 10-15 % CREA Apply 1 Application topically as needed for muscle pain.   nitroGLYCERIN (NITROSTAT) 0.4 MG SL tablet PLACE 1 TABLET UNDER THE TONGUE AS NEEDED FOR CHEST PAIN   OVER THE COUNTER MEDICATION Take 2 tablets by mouth daily with lunch. Prostate Strong   Polyvinyl Alcohol-Povidone (REFRESH OP) Place 1 drop into both eyes daily as needed (dry eyes).   Study - Austin [name] 4 - inclisiran 300 mg/1.60m or placebo SQ injection (PI-[place]uc[place]) Inject 1.5 mLs (300 mg total) into the skin every 6 (six) months.     Allergies:   Zetia [ezetimibe], Crestor [rosuvastatin], Isosorbide, Pravachol [pravastatin sodium], and Silver   Social History   Socioeconomic History   Marital status: Married    Spouse name: Not on file   Number of children: 3   Years of education: Not on file   Highest education level: Not on file  Occupational History   Occupation: retired pHigher education careers adviser Tobacco Use   Smoking status: Never   Smokeless tobacco: Never  Vaping Use   Vaping Use: Never used  Substance and Sexual Activity   Alcohol use: No     Comment: drank some in my 20's"   Drug use: No   Sexual activity: Yes  Other Topics Concern   Not on file  Social History Narrative   Not on file   Social Determinants of Health   Financial Resource Strain: Low Risk  (10/10/2021)   Overall Financial Resource Strain (CARDIA)    Difficulty of Paying Living Expenses: Not hard at all  Food Insecurity: No Food Insecurity (10/10/2021)   Hunger Vital Sign    Worried About Running Out of Food in the Last Year: Never true    [place] the Last Year: Never true  Transportation Needs: No Transportation Needs (10/10/2021)   PRAPARE - THydrologist(Medical): No    Lack of Transportation (Non-Medical): No  Physical Activity: Insufficiently Active (10/10/2021)   Exercise Vital Sign  Days of Exercise per Week: 3 days    Minutes of Exercise per Session: 40 min  Stress: No Stress Concern Present (10/10/2021)   Justice    Feeling of Stress : Not at all  Social Connections: [place] (10/10/2021)   Social Connection and Isolation Panel [NHANES]    Frequency of Communication with Friends and Family: More than three times a week    Frequency of Social Gatherings with Friends and Family: More than three times a week    Attends Religious Services: More than 4 times per year    Active Member of [employer] or Organizations: Yes    Attends Music therapist: More than 4 times per year    Marital Status: Married     Family History: The patient's family history includes Cancer in his mother and sister; Diabetes in his mother; Hyperlipidemia in his mother; Prostate cancer in his father; Stroke in his father.  ROS:   Please see the history of present illness.    All other systems reviewed and are negative.  EKGs/Labs/Other Studies Reviewed:    The following studies were reviewed today: Cardiac catheterization 03/30/2022: 1.  Severe  stenosis of the first OM branch of the circumflex (distal stent edge) treated successfully with PCI using a 3.0 x 16 mm Synergy DES 2.  Continued patency of the proximal LAD stent with no evidence of significant restenosis and stable apical LAD stenosis unchanged from the previous study 3.  Patent left main with no significant stenosis 4.  Patent but diffusely diseased RCA with severe stenosis involving multiple bifurcation areas in the distal branch vessels not suitable for PCI  Recommendations: Same-day PCI protocol if criteria met, dual antiplatelet therapy with aspirin and clopidogrel minimum of 12 months favor long-term therapy in this patient with multiple PCI procedures  EKG:  EKG is ordered today.  The ekg ordered today demonstrates sinus bradycardia 56 bpm, T wave abnormality consider lateral ischemia  Recent Labs: 06/21/2021: ALT 16; TSH 2.66 03/21/2022: BUN 16; Creatinine, Ser 1.07; Hemoglobin 13.7; Platelets 293; Potassium 4.6; Sodium 139  Recent Lipid Panel    Component Value Date/Time   CHOL 221 (H) 04/16/2020 1059   TRIG 108 04/16/2020 1059   HDL 49 04/16/2020 1059   CHOLHDL 4.5 04/16/2020 1059   VLDL 18.4 02/20/2018 1157   LDLCALC 150 (H) 04/16/2020 1059   LDLDIRECT 173.2 07/21/2013 0809     Risk Assessment/Calculations:           Physical Exam:    VS:  BP (!) 140/78   Pulse (!) 56   Ht 6' (1.829 m)   Wt 194 lb 12.8 oz (88.4 kg)   SpO2 99%   BMI 26.42 kg/m     Wt Readings from Last 3 Encounters:  04/04/22 194 lb 12.8 oz (88.4 kg)  03/30/22 194 lb (88 kg)  03/21/22 194 lb 9.6 oz (88.3 kg)     GEN:  Well nourished, well developed in no acute distress HEENT: Normal NECK: No JVD; No carotid bruits LYMPHATICS: No lymphadenopathy CARDIAC: RRR, no murmurs, rubs, gallops RESPIRATORY:  Clear to auscultation without rales, wheezing or rhonchi  ABDOMEN: Soft, non-tender, non-distended MUSCULOSKELETAL:  No edema; No deformity  SKIN: Warm and dry NEUROLOGIC:   Alert and oriented x 3 PSYCHIATRIC:  Normal affect   ASSESSMENT:    1. Coronary artery disease of native artery of native heart with stable angina pectoris ([place])   2. Status post  coronary artery stent placement    PLAN:    In order of problems listed above:  The patient seems to be doing very well after his recent PCI procedure.  His right wrist is examined and shows a 3+ radial pulse, no evidence of hematoma or ecchymosis.  He is having no right hand or wrist symptoms at this time.  His angina has resolved.  He will continue on dual antiplatelet therapy with aspirin and clopidogrel which should be continued for at least 12 months and I would recommend long-term treatment due to his recurrent ischemic symptoms over time.  His antianginal program includes amlodipine.  His lipids are followed through the [place] 4 inclisiran trial protocol with long history of statin-intolerance.  He exercises regularly at the gym on his own and he is not really interested in cardiac rehab.  I would like him to have follow-up with an APP in about 3 months.    Cardiac Rehabilitation Eligibility Assessment            Medication Adjustments/Labs and Tests Ordered: Current medicines are reviewed at length with the patient today.  Concerns regarding medicines are outlined above.  Orders Placed This Encounter  Procedures   EKG 12-Lead   No orders of the defined types were placed in this encounter.   Patient Instructions  Medication Instructions:  Your physician recommends that you continue on your current medications as directed. Please refer to the Current Medication list given to you today.  *If you need a refill on your cardiac medications before your next appointment, please call your pharmacy*   Lab Work: NONE If you have labs (blood work) drawn today and your tests are completely normal, you will receive your results only by: [place] (if you have MyChart) OR A paper copy in the mail If  you have any lab test that is abnormal or we need to change your treatment, we will call you to review the results.   Testing/Procedures: NONE   Follow-Up: At [facility], you and your health needs are our priority.  As part of our continuing mission to provide you with exceptional heart care, we have created designated Provider Care Teams.  These Care Teams include your primary Cardiologist (physician) and Advanced Practice Providers (APPs -  Physician Assistants and Nurse Practitioners) who all work together to provide you with the care you need, when you need it.  Your next appointment:   3 month(s)  The format for your next appointment:   In Person  Provider:   Nicholes Rough, PA-C, Melina Copa, PA-C, Christen Bame, NP, or Richardson Dopp, PA-C     Important Information About Sugar         Signed, Austin Mocha, MD  04/04/2022 2:07 PM    [place]

## 2022-04-04 NOTE — Patient Instructions (Signed)
Medication Instructions:  Your physician recommends that you continue on your current medications as directed. Please refer to the Current Medication list given to you today.  *If you need a refill on your cardiac medications before your next appointment, please call your pharmacy*   Lab Work: NONE If you have labs (blood work) drawn today and your tests are completely normal, you will receive your results only by: [place] (if you have MyChart) OR A paper copy in the mail If you have any lab test that is abnormal or we need to change your treatment, we will call you to review the results.   Testing/Procedures: NONE   Follow-Up: At [facility], you and your health needs are our priority.  As part of our continuing mission to provide you with exceptional heart care, we have created designated Provider Care Teams.  These Care Teams include your primary Cardiologist (physician) and Advanced Practice Providers (APPs -  Physician Assistants and Nurse Practitioners) who all work together to provide you with the care you need, when you need it.  Your next appointment:   3 month(s)  The format for your next appointment:   In Person  Provider:   Nicholes Rough, PA-C, Melina Copa, PA-C, Christen Bame, NP, or Richardson Dopp, PA-C     Important Information About Sugar

## 2022-04-06 ENCOUNTER — Telehealth (HOSPITAL_COMMUNITY): Payer: Self-pay

## 2022-04-06 NOTE — Telephone Encounter (Signed)
Pt is not interested in the cardiac rehab program. Closed referral 

## 2022-04-09 ENCOUNTER — Other Ambulatory Visit: Payer: Self-pay | Admitting: Cardiovascular Disease

## 2022-04-13 ENCOUNTER — Telehealth (HOSPITAL_COMMUNITY): Payer: Self-pay | Admitting: Physician Assistant

## 2022-04-13 NOTE — Telephone Encounter (Signed)
Patient cancelled Myoview and echocardiogram and did not reschedule at this time:  04/10/2022 1:38 PM MG:[id number] KUBAK, Austin [name]  Cancel Rsn: Patient  Order will be removed from the active [place] and if patient calls back to reschedule we will reinstate the orders. Thank you

## 2022-04-18 ENCOUNTER — Other Ambulatory Visit (HOSPITAL_COMMUNITY): Payer: Medicare HMO

## 2022-04-18 ENCOUNTER — Encounter (HOSPITAL_COMMUNITY): Payer: Medicare HMO

## 2022-05-08 DIAGNOSIS — Z006 Encounter for examination for normal comparison and control in clinical research program: Secondary | ICD-10-CM

## 2022-05-08 NOTE — Research (Signed)
Patient was seen today in the research clinic for Month 45 visit of the [place] 4 study. Reviewed all concomitant medications and no changes noted since his last research visit. Patient did state that he had a stent placed here at [facility] on 03-30-2022. This was a scheduled procedure due to fatigue and shortness of breath. It was completed as an outpatient procedure and patient went home the same day. IP was given at 0930 in the RUQ and patient tolerated well without any complaints. No labs due at this visit. Nex visit for Month 51 was scheduled for March 11 @ 9 am.   Current Outpatient Medications:    amLODipine (NORVASC) 10 MG tablet, Take 1 tablet (10 mg total) by mouth daily. Please keep upcoming appointment for future refills. Thank you., Disp: 90 tablet, Rfl: 0   Ascorbic Acid (VITAMIN C PO), Take 1 tablet by mouth 2 (two) times a week., Disp: , Rfl:    aspirin 81 MG tablet, Take 1 tablet (81 mg total) by mouth daily., Disp: 30 tablet, Rfl:    Aspirin-Caffeine (BAYER BACK & BODY) 500-32.5 MG TABS, Take 1 tablet by mouth daily as needed (pain)., Disp: , Rfl:    clopidogrel (PLAVIX) 75 MG tablet, TAKE 1 TABLET EVERY DAY, Disp: 90 tablet, Rfl: 3   dorzolamide-timolol (COSOPT) 22.3-6.8 MG/ML ophthalmic solution, Place 1 drop into both eyes 2 (two) times daily. , Disp: , Rfl:    latanoprost (XALATAN) 0.005 % ophthalmic solution, Place 1 drop into both eyes at bedtime., Disp: , Rfl:    Menthol-Methyl Salicylate (MUSCLE RUB) 10-15 % CREA, Apply 1 Application topically as needed for muscle pain., Disp: , Rfl:    Polyvinyl Alcohol-Povidone (REFRESH OP), Place 1 drop into both eyes daily as needed (dry eyes)., Disp: , Rfl:    Study - ORION 4 - inclisiran 300 mg/1.16m or placebo SQ injection (PI-Stuckey), Inject 1.5 mLs (300 mg total) into the skin every 6 (six) months., Disp: 1 mL, Rfl: 0   Alcohol Swabs (B-D SINGLE USE SWABS REGULAR) PADS, 1 each by Does not apply route as needed., Disp: 3 each, Rfl: 3    nitroGLYCERIN (NITROSTAT) 0.4 MG SL tablet, PLACE 1 TABLET UNDER THE TONGUE AS NEEDED FOR CHEST PAIN, Disp: 25 tablet, Rfl: 9   OVER THE COUNTER MEDICATION, Take 2 tablets by mouth daily with lunch. Prostate Strong, Disp: , Rfl:

## 2022-05-09 DIAGNOSIS — H93293 Other abnormal auditory perceptions, bilateral: Secondary | ICD-10-CM | POA: Diagnosis not present

## 2022-05-09 DIAGNOSIS — H6122 Impacted cerumen, left ear: Secondary | ICD-10-CM | POA: Diagnosis not present

## 2022-05-09 DIAGNOSIS — H9193 Unspecified hearing loss, bilateral: Secondary | ICD-10-CM | POA: Insufficient documentation

## 2022-05-12 ENCOUNTER — Ambulatory Visit: Payer: Medicare HMO | Admitting: Cardiovascular Disease

## 2022-05-16 ENCOUNTER — Ambulatory Visit: Payer: Medicare HMO | Attending: Cardiovascular Disease | Admitting: Cardiovascular Disease

## 2022-05-16 ENCOUNTER — Encounter: Payer: Self-pay | Admitting: Cardiovascular Disease

## 2022-05-16 ENCOUNTER — Ambulatory Visit: Payer: Medicare HMO | Admitting: Cardiovascular Disease

## 2022-05-16 VITALS — BP 128/76 | HR 56 | Ht 73.0 in | Wt 195.2 lb

## 2022-05-16 DIAGNOSIS — I25118 Atherosclerotic heart disease of native coronary artery with other forms of angina pectoris: Secondary | ICD-10-CM | POA: Diagnosis not present

## 2022-05-16 NOTE — Patient Instructions (Signed)
Medication Instructions:  Your physician recommends that you continue on your current medications as directed. Please refer to the Current Medication list given to you today.  *If you need a refill on your cardiac medications before your next appointment, please call your pharmacy*   Lab Work: NONE If you have labs (blood work) drawn today and your tests are completely normal, you will receive your results only by: [place] (if you have MyChart) OR A paper copy in the mail If you have any lab test that is abnormal or we need to change your treatment, we will call you to review the results.   Testing/Procedures: NONE   Follow-Up: At [facility], you and your health needs are our priority.  As part of our continuing mission to provide you with exceptional heart care, we have created designated Provider Care Teams.  These Care Teams include your primary Cardiologist (physician) and Advanced Practice Providers (APPs -  Physician Assistants and Nurse Practitioners) who all work together to provide you with the care you need, when you need it.  Your next appointment:   3 month(s)  The format for your next appointment:   In Person  Provider:   Ambrose Pancoast, NP, Christen Bame, NP, or Richardson Dopp, PA-C     Then, Sherren Mocha, MD will plan to see you again in 6 month(s).      Important Information About Sugar

## 2022-05-16 NOTE — Progress Notes (Signed)
Cardiology Office Note:    Date:  05/16/2022   ID:  [name], DOB [birth year], MRN [id number]  PCP:  Binnie Rail, MD   [place] [facility] Cardiologist:  Sherren Mocha, MD Cardiology APP:  Sharmon Revere     Referring MD: Binnie Rail, MD   Chief Complaint  Patient presents with   Coronary Artery Disease    History of Present Illness:    Austin [name] is a 76 y.o. male with a hx of: Coronary artery disease S/p Lat MI in 2012 tx with BMS to LCx PCI 4/17: DES to prox LAD PCI 7/17: DES to OM1 Residual dz: severe small vessel dz in dist PDA, Dx branches of LAD, apical LAD >> med Rx Myoview 12/19: ant-lat infarct, no ischemia Recurrent CCS class III angina in 2023 >> severe left circumflex stenosis treated with DES Follicular lymphoma, Gr I-II (Dr. Gloriann Loan, [facility]) Hypertension Hyperlipidemia ORION 4 Trial participant Hx of TIA Chest pain Aortic atherosclerosis   The patient is here with his wife today.  He was last seen April 04, 2022 following PCI and he was doing well at that time.  He continues on the same medications without change.  He was at the gym yesterday and had some chest tightness exercise.  He was able to stop and rest and his symptoms abated.  He then was able to walk around the track without recurrent chest discomfort.  He states that he ate a meal prior to exercise which he rarely does.  He is attributing his symptoms to exercising right after a meal.  He has not had any other exertional chest discomfort with his routine activities.  There is no associated shortness of breath or diaphoresis.  He occasionally feels a soreness in his chest after activity but states that this is longstanding and unchanged.  Past Medical History:  Diagnosis Date   Allergy    Arthritis    CAP (community acquired pneumonia)    Cataract    bilateral   Coronary artery disease    03/13/2016 DES to first OM, EF 55% // Myoview 12/19: EF 55,  ant-lat infarction, no significant ischemia, Low Risk   Heart murmur    "outgrew it" (03/13/2016)   Hematuria    HTN (hypertension)    Hx of cardiovascular stress test    ETT-Myoview (10/15): Nondiagnostic EKG changes, anterolateral and inferolateral scar, no ischemia, EF 48%, low risk   Hyperlipemia    Hyperlipidemia    NMR Lipoprofile 2008: LDL 168 ( 2410/ 1826), HDL 41, TG 71. LDL goal = <100, ideally <  70. Framingham Study LDL goal = < 130.   Kidney stones    "passed them"; Dr. Terance Hart (03/13/2016)   Myocardial infarction [facility]) 2001   Non Hodgkin's [facility]   Pleural effusion    TIA (transient ischemic attack) 1980s   "I was exercising when I had it"    Past Surgical History:  Procedure Laterality Date   CARDIAC CATHETERIZATION N/A 12/22/2015   Procedure: Left Heart Cath and Coronary Angiography;  Surgeon: Sherren Mocha, MD;  Location: [place] [facility];  Service: Cardiovascular;  Laterality: N/A;   CARDIAC CATHETERIZATION N/A 12/22/2015   Procedure: Coronary Stent Intervention;  Surgeon: Sherren Mocha, MD;  Location: [place] [facility];  Service: Cardiovascular;  Laterality: N/A;   CARDIAC CATHETERIZATION N/A 12/22/2015   Procedure: Intravascular Pressure Wire/FFR Study;  Surgeon: Sherren Mocha, MD;  Location:  [place] [facility];  Service: Cardiovascular;  Laterality: N/A;   CARDIAC CATHETERIZATION N/A 03/13/2016   Procedure: Left Heart Cath and Coronary Angiography;  Surgeon: Sherren Mocha, MD;  Location: [place] [facility];  Service: Cardiovascular;  Laterality: N/A;   COLONOSCOPY  2002   CORONARY ANGIOPLASTY WITH STENT PLACEMENT  ~ 2013   "1 stent"   CORONARY STENT INTERVENTION N/A 03/30/2022   Procedure: CORONARY STENT INTERVENTION;  Surgeon: Sherren Mocha, MD;  Location: [place] [facility];  Service: Cardiovascular;  Laterality: N/A;   ESOPHAGOGASTRODUODENOSCOPY (EGD) WITH ESOPHAGEAL DILATION  2002   KNEE ARTHROSCOPY Left 1990s   LEFT HEART  CATH AND CORONARY ANGIOGRAPHY N/A 03/30/2022   Procedure: LEFT HEART CATH AND CORONARY ANGIOGRAPHY;  Surgeon: Sherren Mocha, MD;  Location: [place] [facility];  Service: Cardiovascular;  Laterality: N/A;    Current Medications: Current Meds  Medication Sig   Alcohol Swabs (B-D SINGLE USE SWABS REGULAR) PADS 1 each by Does not apply route as needed.   amLODipine (NORVASC) 10 MG tablet Take 1 tablet (10 mg total) by mouth daily. Please keep upcoming appointment for future refills. Thank you.   Ascorbic Acid (VITAMIN C PO) Take 1 tablet by mouth 2 (two) times a week.   aspirin 81 MG tablet Take 1 tablet (81 mg total) by mouth daily.   Aspirin-Caffeine (BAYER BACK & BODY) 500-32.5 MG TABS Take 1 tablet by mouth daily as needed (pain).   clopidogrel (PLAVIX) 75 MG tablet TAKE 1 TABLET EVERY DAY   dorzolamide-timolol (COSOPT) 22.3-6.8 MG/ML ophthalmic solution Place 1 drop into both eyes 2 (two) times daily.    latanoprost (XALATAN) 0.005 % ophthalmic solution Place 1 drop into both eyes at bedtime.   Menthol-Methyl Salicylate (MUSCLE RUB) 10-15 % CREA Apply 1 Application topically as needed for muscle pain.   nitroGLYCERIN (NITROSTAT) 0.4 MG SL tablet PLACE 1 TABLET UNDER THE TONGUE AS NEEDED FOR CHEST PAIN   OVER THE COUNTER MEDICATION Take 2 tablets by mouth daily with lunch. Prostate Strong   Polyvinyl Alcohol-Povidone (REFRESH OP) Place 1 drop into both eyes daily as needed (dry eyes).   Study - ORION 4 - inclisiran 300 mg/1.54m or placebo SQ injection (PI-Stuckey) Inject 1.5 mLs (300 mg total) into the skin every 6 (six) months.     Allergies:   Zetia [ezetimibe], Crestor [rosuvastatin], Isosorbide, Pravachol [pravastatin sodium], and Silver   Social History   Socioeconomic History   Marital status: Married    Spouse name: Not on file   Number of children: 3   Years of education: Not on file   Highest education level: Not on file  Occupational History   Occupation: retired pHigher education careers adviser  Tobacco Use   Smoking status: Never   Smokeless tobacco: Never  Vaping Use   Vaping Use: Never used  Substance and Sexual Activity   Alcohol use: No    Comment: drank some in my 20's"   Drug use: No   Sexual activity: Yes  Other Topics Concern   Not on file  Social History Narrative   Not on file   Social Determinants of Health   Financial Resource Strain: Low Risk  (10/10/2021)   Overall Financial Resource Strain (CARDIA)    Difficulty of Paying Living Expenses: Not hard at all  Food Insecurity: No Food Insecurity (10/10/2021)   Hunger Vital Sign    Worried About Running Out of Food in the Last Year: Never true    [place]  the Last Year: Never true  Transportation Needs: No Transportation Needs (10/10/2021)   PRAPARE - Hydrologist (Medical): No    Lack of Transportation (Non-Medical): No  Physical Activity: Insufficiently Active (10/10/2021)   Exercise Vital Sign    Days of Exercise per Week: 3 days    Minutes of Exercise per Session: 40 min  Stress: No Stress Concern Present (10/10/2021)   [place]    Feeling of Stress : Not at all  Social Connections: [place] (10/10/2021)   Social Connection and Isolation Panel [NHANES]    Frequency of Communication with Friends and Family: More than three times a week    Frequency of Social Gatherings with Friends and Family: More than three times a week    Attends Religious Services: More than 4 times per year    Active Member of [employer] or Organizations: Yes    Attends Music therapist: More than 4 times per year    Marital Status: Married     Family History: The patient's family history includes Cancer in his mother and sister; Diabetes in his mother; Hyperlipidemia in his mother; Prostate cancer in his father; Stroke in his father.  ROS:   Please see the history of present illness.    All other systems  reviewed and are negative.  EKGs/Labs/Other Studies Reviewed:    Recent Labs: 06/21/2021: ALT 16; TSH 2.66 03/21/2022: BUN 16; Creatinine, Ser 1.07; Hemoglobin 13.7; Platelets 293; Potassium 4.6; Sodium 139  Recent Lipid Panel    Component Value Date/Time   CHOL 221 (H) 04/16/2020 1059   TRIG 108 04/16/2020 1059   HDL 49 04/16/2020 1059   CHOLHDL 4.5 04/16/2020 1059   VLDL 18.4 02/20/2018 1157   LDLCALC 150 (H) 04/16/2020 1059   LDLDIRECT 173.2 07/21/2013 0809     Risk Assessment/Calculations:                Physical Exam:    VS:  BP 128/76   Pulse (!) 56   Ht '6\' 1"'$  (1.854 m)   Wt 195 lb 3.2 oz (88.5 kg)   SpO2 99%   BMI 25.75 kg/m     Wt Readings from Last 3 Encounters:  05/16/22 195 lb 3.2 oz (88.5 kg)  04/04/22 194 lb 12.8 oz (88.4 kg)  03/30/22 194 lb (88 kg)     GEN:  Well nourished, well developed in no acute distress HEENT: Normal NECK: No JVD; No carotid bruits LYMPHATICS: No lymphadenopathy CARDIAC: RRR, no murmurs, rubs, gallops RESPIRATORY:  Clear to auscultation without rales, wheezing or rhonchi  ABDOMEN: Soft, non-tender, non-distended MUSCULOSKELETAL:  No edema; No deformity  SKIN: Warm and dry NEUROLOGIC:  Alert and oriented x 3 PSYCHIATRIC:  Normal affect   ASSESSMENT:    1. Coronary artery disease of native artery of native heart with stable angina pectoris ([place])    PLAN:    In order of problems listed above:  The patient had an episode of chest discomfort yesterday as outlined above.  This occurred after eating a meal before exercise, which the patient states he rarely does.  We talked about the mechanism of postprandial angina.  We will continue his medical program with clopidogrel for dual antiplatelet therapy and amlodipine for antianginal therapy.  I did not make any changes today.  He will keep an eye on his symptoms but he continues to slowly feel better after PCI last month.  He can  do his daily activities without chest  discomfort or functional limitation.  If he has recurrent angina, he will contact us.  He will try eating smaller snacks before exercise like he has typically done in the past without problems.  We will arrange follow-up in 3 months with an APP.           Medication Adjustments/Labs and Tests Ordered: Current medicines are reviewed at length with the patient today.  Concerns regarding medicines are outlined above.  No orders of the defined types were placed in this encounter.  No orders of the defined types were placed in this encounter.   Patient Instructions  Medication Instructions:  Your physician recommends that you continue on your current medications as directed. Please refer to the Current Medication list given to you today.  *If you need a refill on your cardiac medications before your next appointment, please call your pharmacy*   Lab Work: NONE If you have labs (blood work) drawn today and your tests are completely normal, you will receive your results only by: [place] (if you have MyChart) OR A paper copy in the mail If you have any lab test that is abnormal or we need to change your treatment, we will call you to review the results.   Testing/Procedures: NONE   Follow-Up: At [facility], you and your health needs are our priority.  As part of our continuing mission to provide you with exceptional heart care, we have created designated Provider Care Teams.  These Care Teams include your primary Cardiologist (physician) and Advanced Practice Providers (APPs -  Physician Assistants and Nurse Practitioners) who all work together to provide you with the care you need, when you need it.  Your next appointment:   3 month(s)  The format for your next appointment:   In Person  Provider:   Ambrose Pancoast, NP, Christen Bame, NP, or Richardson Dopp, PA-C     Then, Sherren Mocha, MD will plan to see you again in 6 month(s).      Important Information  About Sugar         Signed, Sherren Mocha, MD  05/16/2022 1:18 PM    [place]

## 2022-05-25 DIAGNOSIS — H903 Sensorineural hearing loss, bilateral: Secondary | ICD-10-CM | POA: Diagnosis not present

## 2022-06-05 ENCOUNTER — Other Ambulatory Visit: Payer: Self-pay

## 2022-06-05 DIAGNOSIS — H401122 Primary open-angle glaucoma, left eye, moderate stage: Secondary | ICD-10-CM | POA: Diagnosis not present

## 2022-06-05 DIAGNOSIS — H401111 Primary open-angle glaucoma, right eye, mild stage: Secondary | ICD-10-CM | POA: Diagnosis not present

## 2022-06-05 DIAGNOSIS — H40033 Anatomical narrow angle, bilateral: Secondary | ICD-10-CM | POA: Diagnosis not present

## 2022-06-13 ENCOUNTER — Encounter: Payer: Self-pay | Admitting: Physician Assistant

## 2022-06-13 ENCOUNTER — Ambulatory Visit: Payer: Medicare HMO | Attending: Nurse Practitioner | Admitting: Physician Assistant

## 2022-06-13 VITALS — BP 138/62 | HR 68 | Ht 73.0 in | Wt 198.4 lb

## 2022-06-13 DIAGNOSIS — R079 Chest pain, unspecified: Secondary | ICD-10-CM | POA: Diagnosis not present

## 2022-06-13 DIAGNOSIS — I25119 Atherosclerotic heart disease of native coronary artery with unspecified angina pectoris: Secondary | ICD-10-CM | POA: Diagnosis not present

## 2022-06-13 DIAGNOSIS — I10 Essential (primary) hypertension: Secondary | ICD-10-CM

## 2022-06-13 DIAGNOSIS — I700 Atherosclerosis of aorta: Secondary | ICD-10-CM

## 2022-06-13 DIAGNOSIS — E782 Mixed hyperlipidemia: Secondary | ICD-10-CM | POA: Diagnosis not present

## 2022-06-13 LAB — BASIC METABOLIC PANEL
BUN/Creatinine Ratio: 15 (ref 10–24)
BUN: 17 mg/dL (ref 8–27)
CO2: 31 mmol/L — ABNORMAL HIGH (ref 20–29)
Calcium: 10.4 mg/dL — ABNORMAL HIGH (ref 8.6–10.2)
Chloride: 103 mmol/L (ref 96–106)
Creatinine, Ser: 1.14 mg/dL (ref 0.76–1.27)
Glucose: 88 mg/dL (ref 70–99)
Potassium: 4.3 mmol/L (ref 3.5–5.2)
Sodium: 139 mmol/L (ref 134–144)
eGFR: 66 mL/min/{1.73_m2} (ref >59)

## 2022-06-13 LAB — CBC
Hematocrit: 38.5 % (ref 37.5–51.0)
Hemoglobin: 13.1 g/dL (ref 13.0–17.7)
MCH: 29.8 pg (ref 26.6–33.0)
MCHC: 34 g/dL (ref 31.5–35.7)
MCV: 88 fL (ref 79–97)
Platelets: 284 10*3/uL (ref 150–450)
RBC: 4.4 x10E6/uL (ref 4.14–5.80)
RDW: 13.8 % (ref 11.6–15.4)
WBC: 4.4 10*3/uL (ref 3.4–10.8)

## 2022-06-13 MED ORDER — RANOLAZINE ER 500 MG PO TB12: 500.0000 mg | ORAL_TABLET | Freq: Two times a day (BID) | ORAL | 3 refills | Status: DC

## 2022-06-13 NOTE — Patient Instructions (Addendum)
Medication Instructions:  Your physician has recommended you make the following change in your medication:   START Ranexa 500 mg taking 1 twice a day   *If you need a refill on your cardiac medications before your next appointment, please call your pharmacy*   Lab Work: TODAY:  BMET & CBC  If you have labs (blood work) drawn today and your tests are completely normal, you will receive your results only by: [place] (if you have MyChart) OR A paper copy in the mail If you have any lab test that is abnormal or we need to change your treatment, we will call you to review the results.   Testing/Procedures: Your physician has requested that you have a cardiac catheterization. Cardiac catheterization is used to diagnose and/or treat various heart conditions. Doctors may recommend this procedure for a number of different reasons. The most common reason is to evaluate chest pain. Chest pain can be a symptom of coronary artery disease (CAD), and cardiac catheterization can show whether plaque is narrowing or blocking your heart's arteries. This procedure is also used to evaluate the valves, as well as measure the blood flow and oxygen levels in different parts of your heart. For further information please visit [email or web address]. Please follow instruction sheet, BELOW:     Cardiac/Peripheral Catheterization   You are scheduled for a Cardiac Catheterization on Thursday, October 19 with Dr. Sherren Mocha.  1. Please arrive at the Main Entrance A at [facility]: [place], [place] [address] on October 19 at 12:30 PM (This time is two hours before your procedure to ensure your preparation). Free valet parking service is available. You will check in at ADMITTING. The support person will be asked to wait in the waiting room.  It is OK to have someone drop you off and come back when you are ready to be discharged.        Special note: Every effort is made to have your  procedure done on time. Please understand that emergencies sometimes delay scheduled procedures.   . 2. Diet: Do not eat solid foods after midnight.  You may have clear liquids until 5 AM the day of the procedure.  3. Labs: You will need to have blood drawn on TODAY.  4. Medication instructions in preparation for your procedure:   Contrast Allergy: No  On the morning of your procedure, take Aspirin 81 mg and Plavix/Clopidogrel and any morning medicines NOT listed above.  You may use sips of water.  5. Plan to go home the same day, you will only stay overnight if medically necessary. 6. You MUST have a responsible adult to drive you home. 7. An adult MUST be with you the first 24 hours after you arrive home. 8. Bring a current list of your medications, and the last time and date medication taken. 9. Bring ID and current insurance cards. 10.Please wear clothes that are easy to get on and off and wear slip-on shoes.  Thank you for allowing [place] to care for you!   -- [place] [facility]     Follow-Up: At [facility], you and your health needs are our priority.  As part of our continuing mission to provide you with exceptional heart care, we have created designated Provider Care Teams.  These Care Teams include your primary Cardiologist (physician) and Advanced Practice Providers (APPs -  Physician Assistants and Nurse Practitioners) who all work together to provide you with the care you  need, when you need it.  We recommend signing up for the patient portal called "MyChart".  Sign up information is provided on this After Visit Summary.  MyChart is used to connect with patients for Virtual Visits (Telemedicine).  Patients are able to view lab/test results, encounter notes, upcoming appointments, etc.  Non-urgent messages can be sent to your provider as well.   To learn more about what you can do with MyChart, go to [email or web address].    Your next  appointment:   2 week(s)  06/28/22 ARRIVE AT 2:05  The format for your next appointment:   In Person  Provider:   Richardson Dopp, PA-C         Other Instructions   Important Information About Sugar

## 2022-06-13 NOTE — Assessment & Plan Note (Signed)
History of myocardial infarction 2012 treated a BMS to the LCx and multiple PCI procedures since.  He has undergone stenting to the OM1, proximal LAD and most recently to the OM1 at the distal stent edge.  He also has residual severe small vessel disease in the diagonal branches and apical LAD as well as diffuse disease of the RCA involving multiple bifurcation areas in the distal branch vessels, not suitable for PCI.  He certainly has residual disease that could contribute to angina.  However, he had significant improvement in his symptoms without angina after his PCI.  Over the past 2 weeks, he has had progressively worsening symptoms with CCS class II-III angina.  His electrocardiogram does not demonstrate any significant changes.  Given his change in symptoms, I have recommended proceeding with relook cardiac catheterization.  I have also recommended adjusting his GDMT further with the addition of Ranexa.  He could not tolerate isosorbide in the past and metoprolol had to be stopped secondary to bradycardia/syncope.  I reviewed his case today with Dr. Myles Gip (attending MD) who agreed with proceeding with cardiac catheterization. Continue amlodipine 10 mg daily, aspirin 81 mg daily, Plavix 85 mg daily, as needed nitroglycerin Add Ranexa 500 mg twice daily BMET CBC today Arrange cardiac catheterization with Dr. Burt Knack later this week Follow-up 2 weeks post cardiac catheterization

## 2022-06-13 NOTE — Progress Notes (Signed)
Cardiology Office Note:    Date:  06/13/2022   ID:  Austin [name], DOB 1946/07/12, MRN [id number]  PCP:  Binnie Rail, MD  [place] Providers Cardiologist:  Sherren Mocha, MD Cardiology APP:  Sharmon Revere     Referring MD: Binnie Rail, MD   Chief Complaint:  Chest Pain    Patient Profile: Coronary artery disease S/p Lat MI in 2012 tx with BMS to LCx PCI [name]/17: DES to prox LAD PCI 7/17: DES to OM1 Residual dz: severe small vessel dz in dist PDA, Dx branches of LAD, apical LAD >> med Rx Myoview 12/19: ant-lat infarct, no ischemia PCI 03/2022: s/p 3 x 16 mm DES to OM1 Follicular lymphoma, Gr I-II (Dr. Gloriann [name], [facility]) Hypertension Hyperlipidemia Austin [name] Trial participant Hx of TIA Aortic atherosclerosis   Prior CV Studies: LEFT HEART CATH 03/30/2022 LAD prox stent patent, mid mild diffuse dz, apical 80-90; lateral D1 90-small vessel/unchanged, D2 90-small vessel/unchanged AV LCx distal occlusion-unchanged, fills from collaterals from inferoseptal; OM1 stents patent with 95 at distal stent edge RCA proximal 40-50, distal 50; RPDA ostial 50 (unchanged), 80 (unchanged) PCI: 3 x 16 mm Synergy DES to the OM1  1.  Severe stenosis of the first OM branch of the circumflex (distal stent edge) treated successfully with PCI using a 3.0 x 16 mm Synergy DES 2.  Continued patency of the proximal LAD stent with no evidence of significant restenosis and stable apical LAD stenosis unchanged from the previous study 3.  Patent left main with no significant stenosis [name].  Patent but diffusely diseased RCA with severe stenosis involving multiple bifurcation areas in the distal branch vessels not suitable for PCI     Myoview 08/12/18 EF 55, nat-lat infarct, no ischemia   Carotid US 11/12/16 Bilateral ICA 1-39   Echo 1/18 EF 29-92, grade 1 diastolic dysfunction, trivial AI, dilated aortic root (42), normal RVSF, trivial pericardial effusion   CARDIAC  CATHETERIZATION 03/13/16 PCI: 2.75 x 18 mm Xience Alpine DES to OM1  History of Present Illness:   Austin [name] is a 76 y.o. male with the above problem list.  He was last seen by Dr. Burt [name] 05/16/22. The patient recently underwent PCI with DES to the OM1. He had noted some chest pain with exercise after eating a meal. His chest pain resolved with rest. No meds were changed and 3 mos f/u was recommended.   He returns for evaluation of chest pain.  Since his most recent PCI in August 2023, he had been doing well without anginal symptoms.  He had gotten back to regular activities.  However, over the past 2 to 3 weeks, he has developed substernal chest discomfort.  This is occurred with minimal activity at times.  He has had to take nitroglycerin on 1 occasion (required 2 tablets).  He has had to decrease the speed of the treadmill at the gym to prevent symptoms.  His chest discomfort is becoming more frequent and is beginning to remind him of what he had prior to his last heart catheterization.  He has not had any rest symptoms, significant shortness of breath, syncope, orthopnea, leg edema.        Past Medical History:  Diagnosis Date   Allergy    Arthritis    CAP (community acquired pneumonia)    Cataract    bilateral   Coronary artery disease    03/13/2016 DES to first OM, EF 55% // Myoview 12/19: EF 55,  ant-lat infarction, no significant ischemia, Low Risk   Heart murmur    "outgrew it" (03/13/2016)   Hematuria    HTN (hypertension)    Hx of cardiovascular stress test    ETT-Myoview (10/15): Nondiagnostic EKG changes, anterolateral and inferolateral scar, no ischemia, EF 48%, low risk   Hyperlipemia    Hyperlipidemia    NMR Lipoprofile 2008: LDL 168 ( 2410/ 1826), HDL 41, TG 71. LDL goal = <100, ideally <  70. Framingham Study LDL goal = < 130.   Kidney stones    "passed them"; Dr. Terance Hart (03/13/2016)   Myocardial infarction [facility]) 2001   Non Hodgkin's lymphoma ([place])    [facility]   Pleural effusion    TIA (transient ischemic attack) 1980s   "I was exercising when I had it"   Current Medications: Current Meds  Medication Sig   Alcohol Swabs (B-D SINGLE USE SWABS REGULAR) PADS 1 each by Does not apply route as needed.   amLODipine (NORVASC) 10 MG tablet Take 1 tablet (10 mg total) by mouth daily. Please keep upcoming appointment for future refills. Thank you.   Ascorbic Acid (VITAMIN C PO) Take 1 tablet by mouth 2 (two) times a week.   aspirin 81 MG tablet Take 1 tablet (81 mg total) by mouth daily.   Aspirin-Caffeine (BAYER BACK & BODY) 500-32.5 MG TABS Take 1 tablet by mouth daily as needed (pain).   clopidogrel (PLAVIX) 75 MG tablet TAKE 1 TABLET EVERY DAY   dorzolamide-timolol (COSOPT) 22.3-6.8 MG/ML ophthalmic solution Place 1 drop into both eyes 2 (two) times daily.    latanoprost (XALATAN) 0.005 % ophthalmic solution Place 1 drop into both eyes at bedtime.   Menthol-Methyl Salicylate (MUSCLE RUB) 10-15 % CREA Apply 1 Application topically as needed for muscle pain.   nitroGLYCERIN (NITROSTAT) 0.[name] MG SL tablet PLACE 1 TABLET UNDER THE TONGUE AS NEEDED FOR CHEST PAIN   OVER THE COUNTER MEDICATION Take 2 tablets by mouth daily with lunch. Prostate Strong   Polyvinyl Alcohol-Povidone (REFRESH OP) Place 1 drop into both eyes daily as needed (dry eyes).   ranolazine (RANEXA) 500 MG 12 hr tablet Take 1 tablet (500 mg total) by mouth 2 (two) times daily.   Study - Austin [name] - inclisiran 300 mg/1.3m or placebo SQ injection (PI-Stuckey) Inject 1.5 mLs (300 mg total) into the skin every 6 (six) months.    Allergies:   Zetia [ezetimibe], Crestor [rosuvastatin], Isosorbide, Pravachol [pravastatin sodium], and Silver   Social History   Tobacco Use   Smoking status: Never   Smokeless tobacco: Never  Vaping Use   Vaping Use: Never used  Substance Use Topics   Alcohol use: No    Comment: drank some in my 20's"   Drug use: No    Family Hx: The patient's family  history includes Cancer in his mother and sister; Diabetes in his mother; Hyperlipidemia in his mother; Prostate cancer in his father; Stroke in his father.  Review of Systems  Constitutional: Negative for chills and fever.  Respiratory:  Negative for cough.   Gastrointestinal:  Negative for diarrhea, hematochezia and vomiting.  Genitourinary:  Negative for hematuria.     EKGs/Labs/Other Test Reviewed:    EKG:  EKG is  ordered today.  The ekg ordered today demonstrates bradycardia, HR 58, normal axis, T wave inversions 1, aVL, V4-V6, QTc 400, no change from prior tracing  Recent Labs: 06/21/2021: ALT 16; TSH 2.66 06/13/2022: BUN 17; Creatinine, Ser 1.14; Hemoglobin 13.1;  Platelets 284; Potassium [name].3; Sodium 139   Recent Lipid Panel No results for input(s): "CHOL", "TRIG", "HDL", "VLDL", "LDLCALC", "LDLDIRECT" in the last 8760 hours.   Risk Assessment/Calculations/Metrics:              Physical Exam:    VS:  BP 138/62   Pulse 68   Ht '6\' 1"'$  (1.854 m)   Wt 198 lb 6.[name] oz (90 kg)   SpO2 98%   BMI 26.18 kg/m     Wt Readings from Last 3 Encounters:  06/13/22 198 lb 6.[name] oz (90 kg)  05/16/22 195 lb 3.2 oz (88.5 kg)  04/04/22 194 lb 12.8 oz (88.[name] kg)    Constitutional:      Appearance: Healthy appearance. Not in distress.  Neck:     Vascular: JVD normal.  Pulmonary:     Effort: Pulmonary effort is normal.     Breath sounds: No wheezing. No rales.  Cardiovascular:     Normal rate. Regular rhythm. Normal S1. Normal S2.      Murmurs: There is no murmur.  Edema:    Peripheral edema absent.  Abdominal:     Palpations: Abdomen is soft.  Skin:    General: Skin is warm and dry.  Neurological:     General: No focal deficit present.     Mental Status: Alert and oriented to person, place and time.         ASSESSMENT & PLAN:   Coronary artery disease involving native coronary artery of native heart with angina pectoris ([place]) History of myocardial infarction 2012 treated a  BMS to the LCx and multiple PCI procedures since.  He has undergone stenting to the OM1, proximal LAD and most recently to the OM1 at the distal stent edge.  He also has residual severe small vessel disease in the diagonal branches and apical LAD as well as diffuse disease of the RCA involving multiple bifurcation areas in the distal branch vessels, not suitable for PCI.  He certainly has residual disease that could contribute to angina.  However, he had significant improvement in his symptoms without angina after his PCI.  Over the past 2 weeks, he has had progressively worsening symptoms with CCS class II-III angina.  His electrocardiogram does not demonstrate any significant changes.  Given his change in symptoms, I have recommended proceeding with relook cardiac catheterization.  I have also recommended adjusting his GDMT further with the addition of Ranexa.  He could not tolerate isosorbide in the past and metoprolol had to be stopped secondary to bradycardia/syncope.  I reviewed his case today with Dr. Myles Gip (attending MD) who agreed with proceeding with cardiac catheterization. Continue amlodipine 10 mg daily, aspirin 81 mg daily, Plavix 85 mg daily, as needed nitroglycerin Add Ranexa 500 mg twice daily BMET CBC today Arrange cardiac catheterization with Dr. Burt [name] later this week Follow-up 2 weeks post cardiac catheterization  Mixed hyperlipidemia History of intolerance to statins.  He remains enrolled in the [place] [name] trial.  Essential hypertension Blood pressure is controlled on current therapy.  Continue amlodipine 10 mg daily.  Atherosclerosis of aorta (HCC) Continue aspirin therapy.  He is intolerant of statins.         Shared Decision Making/Informed Consent The risks [stroke (1 in 1000), death (1 in 1000), kidney failure [usually temporary] (1 in 500), bleeding (1 in 200), allergic reaction [possibly serious] (1 in 200)], benefits (diagnostic support and management of coronary artery  disease) and alternatives of a cardiac catheterization were discussed  in detail with Austin [name] and he is willing to proceed.   Dispo:  Return for Post Procedure Follow Up, w/ Dr. Burt [name], or Richardson Dopp, PA-C.   Medication Adjustments/Labs and Tests Ordered: Current medicines are reviewed at length with the patient today.  Concerns regarding medicines are outlined above.  Tests Ordered: Orders Placed This Encounter  Procedures   Basic metabolic panel   CBC   EKG 12-Lead   Medication Changes: Meds ordered this encounter  Medications   ranolazine (RANEXA) 500 MG 12 hr tablet    Sig: Take 1 tablet (500 mg total) by mouth 2 (two) times daily.    Dispense:  180 tablet    Refill:  3   Signed, Richardson Dopp, PA-C  06/13/2022 5:40 PM    [address], [place], [place]  [address] Phone: [phone]; Fax: [phone]

## 2022-06-13 NOTE — Assessment & Plan Note (Addendum)
History of intolerance to statins.  He remains enrolled in the [place] 4 trial.

## 2022-06-13 NOTE — H&P (View-Only) (Signed)
Cardiology Office Note:    Date:  06/13/2022   ID:  Austin [name], DOB May 06, 1946, MRN [id number]  PCP:  Binnie Rail, MD  [place] Providers Cardiologist:  Sherren Mocha, MD Cardiology APP:  Sharmon Revere     Referring MD: Binnie Rail, MD   Chief Complaint:  Chest Pain    Patient Profile: Coronary artery disease S/p Lat MI in 2012 tx with BMS to LCx PCI 4/17: DES to prox LAD PCI 7/17: DES to OM1 Residual dz: severe small vessel dz in dist PDA, Dx branches of LAD, apical LAD >> med Rx Myoview 12/19: ant-lat infarct, no ischemia PCI 03/2022: s/p 3 x 16 mm DES to OM1 Follicular lymphoma, Gr I-II (Dr. Gloriann Loan, [facility]) Hypertension Hyperlipidemia [place] 4 Trial participant Hx of TIA Aortic atherosclerosis   Prior CV Studies: LEFT HEART CATH 03/30/2022 LAD prox stent patent, mid mild diffuse dz, apical 80-90; lateral D1 90-small vessel/unchanged, D2 90-small vessel/unchanged AV LCx distal occlusion-unchanged, fills from collaterals from inferoseptal; OM1 stents patent with 95 at distal stent edge RCA proximal 40-50, distal 50; RPDA ostial 50 (unchanged), 80 (unchanged) PCI: 3 x 16 mm Synergy DES to the OM1  1.  Severe stenosis of the first OM branch of the circumflex (distal stent edge) treated successfully with PCI using a 3.0 x 16 mm Synergy DES 2.  Continued patency of the proximal LAD stent with no evidence of significant restenosis and stable apical LAD stenosis unchanged from the previous study 3.  Patent left main with no significant stenosis 4.  Patent but diffusely diseased RCA with severe stenosis involving multiple bifurcation areas in the distal branch vessels not suitable for PCI     Myoview 08/12/18 EF 55, nat-lat infarct, no ischemia   Carotid US 11/12/16 Bilateral ICA 1-39   Echo 1/18 EF 78-46, grade 1 diastolic dysfunction, trivial AI, dilated aortic root (42), normal RVSF, trivial pericardial effusion   CARDIAC  CATHETERIZATION 03/13/16 PCI: 2.75 x 18 mm Xience Alpine DES to OM1  History of Present Illness:   Austin [name] is a 76 y.o. male with the above problem list.  He was last seen by Dr. Burt Knack 05/16/22. The patient recently underwent PCI with DES to the OM1. He had noted some chest pain with exercise after eating a meal. His chest pain resolved with rest. No meds were changed and 3 mos f/u was recommended.   He returns for evaluation of chest pain.  Since his most recent PCI in August 2023, he had been doing well without anginal symptoms.  He had gotten back to regular activities.  However, over the past 2 to 3 weeks, he has developed substernal chest discomfort.  This is occurred with minimal activity at times.  He has had to take nitroglycerin on 1 occasion (required 2 tablets).  He has had to decrease the speed of the treadmill at the gym to prevent symptoms.  His chest discomfort is becoming more frequent and is beginning to remind him of what he had prior to his last heart catheterization.  He has not had any rest symptoms, significant shortness of breath, syncope, orthopnea, leg edema.        Past Medical History:  Diagnosis Date   Allergy    Arthritis    CAP (community acquired pneumonia)    Cataract    bilateral   Coronary artery disease    03/13/2016 DES to first OM, EF 55% // Myoview 12/19: EF 55,  ant-lat infarction, no significant ischemia, Low Risk   Heart murmur    "outgrew it" (03/13/2016)   Hematuria    HTN (hypertension)    Hx of cardiovascular stress test    ETT-Myoview (10/15): Nondiagnostic EKG changes, anterolateral and inferolateral scar, no ischemia, EF 48%, low risk   Hyperlipemia    Hyperlipidemia    NMR Lipoprofile 2008: LDL 168 ( 2410/ 1826), HDL 41, TG 71. LDL goal = <100, ideally <  70. Framingham Study LDL goal = < 130.   Kidney stones    "passed them"; Dr. Terance Hart (03/13/2016)   Myocardial infarction [facility]) 2001   Non Hodgkin's lymphoma ([place])    [facility]   Pleural effusion    TIA (transient ischemic attack) 1980s   "I was exercising when I had it"   Current Medications: Current Meds  Medication Sig   Alcohol Swabs (B-D SINGLE USE SWABS REGULAR) PADS 1 each by Does not apply route as needed.   amLODipine (NORVASC) 10 MG tablet Take 1 tablet (10 mg total) by mouth daily. Please keep upcoming appointment for future refills. Thank you.   Ascorbic Acid (VITAMIN C PO) Take 1 tablet by mouth 2 (two) times a week.   aspirin 81 MG tablet Take 1 tablet (81 mg total) by mouth daily.   Aspirin-Caffeine (BAYER BACK & BODY) 500-32.5 MG TABS Take 1 tablet by mouth daily as needed (pain).   clopidogrel (PLAVIX) 75 MG tablet TAKE 1 TABLET EVERY DAY   dorzolamide-timolol (COSOPT) 22.3-6.8 MG/ML ophthalmic solution Place 1 drop into both eyes 2 (two) times daily.    latanoprost (XALATAN) 0.005 % ophthalmic solution Place 1 drop into both eyes at bedtime.   Menthol-Methyl Salicylate (MUSCLE RUB) 10-15 % CREA Apply 1 Application topically as needed for muscle pain.   nitroGLYCERIN (NITROSTAT) 0.4 MG SL tablet PLACE 1 TABLET UNDER THE TONGUE AS NEEDED FOR CHEST PAIN   OVER THE COUNTER MEDICATION Take 2 tablets by mouth daily with lunch. Prostate Strong   Polyvinyl Alcohol-Povidone (REFRESH OP) Place 1 drop into both eyes daily as needed (dry eyes).   ranolazine (RANEXA) 500 MG 12 hr tablet Take 1 tablet (500 mg total) by mouth 2 (two) times daily.   Study - [place] 4 - inclisiran 300 mg/1.2m or placebo SQ injection (PI-Stuckey) Inject 1.5 mLs (300 mg total) into the skin every 6 (six) months.    Allergies:   Zetia [ezetimibe], Crestor [rosuvastatin], Isosorbide, Pravachol [pravastatin sodium], and Silver   Social History   Tobacco Use   Smoking status: Never   Smokeless tobacco: Never  Vaping Use   Vaping Use: Never used  Substance Use Topics   Alcohol use: No    Comment: drank some in my 20's"   Drug use: No    Family Hx: The patient's family  history includes Cancer in his mother and sister; Diabetes in his mother; Hyperlipidemia in his mother; Prostate cancer in his father; Stroke in his father.  Review of Systems  Constitutional: Negative for chills and fever.  Respiratory:  Negative for cough.   Gastrointestinal:  Negative for diarrhea, hematochezia and vomiting.  Genitourinary:  Negative for hematuria.     EKGs/Labs/Other Test Reviewed:    EKG:  EKG is  ordered today.  The ekg ordered today demonstrates bradycardia, HR 58, normal axis, T wave inversions 1, aVL, V4-V6, QTc 400, no change from prior tracing  Recent Labs: 06/21/2021: ALT 16; TSH 2.66 06/13/2022: BUN 17; Creatinine, Ser 1.14; Hemoglobin 13.1;  Platelets 284; Potassium 4.3; Sodium 139   Recent Lipid Panel No results for input(s): "CHOL", "TRIG", "HDL", "VLDL", "LDLCALC", "LDLDIRECT" in the last 8760 hours.   Risk Assessment/Calculations/Metrics:              Physical Exam:    VS:  BP 138/62   Pulse 68   Ht '6\' 1"'$  (1.854 m)   Wt 198 lb 6.4 oz (90 kg)   SpO2 98%   BMI 26.18 kg/m     Wt Readings from Last 3 Encounters:  06/13/22 198 lb 6.4 oz (90 kg)  05/16/22 195 lb 3.2 oz (88.5 kg)  04/04/22 194 lb 12.8 oz (88.4 kg)    Constitutional:      Appearance: Healthy appearance. Not in distress.  Neck:     Vascular: JVD normal.  Pulmonary:     Effort: Pulmonary effort is normal.     Breath sounds: No wheezing. No rales.  Cardiovascular:     Normal rate. Regular rhythm. Normal S1. Normal S2.      Murmurs: There is no murmur.  Edema:    Peripheral edema absent.  Abdominal:     Palpations: Abdomen is soft.  Skin:    General: Skin is warm and dry.  Neurological:     General: No focal deficit present.     Mental Status: Alert and oriented to person, place and time.         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  He has undergone stenting to the OM1, proximal LAD and most recently to the OM1 at the distal stent edge.  He also has residual severe small vessel disease in the diagonal branches and apical LAD as well as diffuse disease of the RCA involving multiple bifurcation areas in the distal branch vessels, not suitable for PCI.  He certainly has residual disease that could contribute to angina.  However, he had significant improvement in his symptoms without angina after his PCI.  Over the past 2 weeks, he has had progressively worsening symptoms with CCS class II-III angina.  His electrocardiogram does not demonstrate any significant changes.  Given his change in symptoms, I have recommended proceeding with relook cardiac catheterization.  I have also recommended adjusting his GDMT further with the addition of Ranexa.  He could not tolerate isosorbide in the past and metoprolol had to be stopped secondary to bradycardia/syncope.  I reviewed his case today with Dr. Myles Gip (attending MD) who agreed with proceeding with cardiac catheterization. Continue amlodipine 10 mg daily, aspirin 81 mg daily, Plavix 85 mg daily, as needed nitroglycerin Add Ranexa 500 mg twice daily BMET CBC today Arrange cardiac catheterization with Dr. Burt Knack later this week Follow-up 2 weeks post cardiac catheterization  Mixed hyperlipidemia History of intolerance to statins.  He remains enrolled in the [place] 4 trial.  Essential hypertension Blood pressure is controlled on current therapy.  Continue amlodipine 10 mg daily.  Atherosclerosis of aorta (HCC) Continue aspirin therapy.  He is intolerant of statins.         Shared Decision Making/Informed Consent The risks [stroke (1 in 1000), death (1 in 1000), kidney failure [usually temporary] (1 in 500), bleeding (1 in 200), allergic reaction [possibly serious] (1 in 200)], benefits (diagnostic support and management of coronary artery  disease) and alternatives of a cardiac catheterization were discussed  in detail with Mr. Orsino and he is willing to proceed.   Dispo:  Return for Post Procedure Follow Up, w/ Dr. Burt Knack, or Richardson Dopp, PA-C.   Medication Adjustments/Labs and Tests Ordered: Current medicines are reviewed at length with the patient today.  Concerns regarding medicines are outlined above.  Tests Ordered: Orders Placed This Encounter  Procedures   Basic metabolic panel   CBC   EKG 12-Lead   Medication Changes: Meds ordered this encounter  Medications   ranolazine (RANEXA) 500 MG 12 hr tablet    Sig: Take 1 tablet (500 mg total) by mouth 2 (two) times daily.    Dispense:  180 tablet    Refill:  3   Signed, Richardson Dopp, PA-C  06/13/2022 5:40 PM    [address], [place], [place]  [address] Phone: [phone]; Fax: [phone]

## 2022-06-13 NOTE — Assessment & Plan Note (Signed)
Blood pressure is controlled on current therapy.  Continue amlodipine 10 mg daily.

## 2022-06-13 NOTE — Assessment & Plan Note (Signed)
Continue aspirin therapy.  He is intolerant of statins.

## 2022-06-14 ENCOUNTER — Telehealth: Payer: Self-pay | Admitting: *Deleted

## 2022-06-14 NOTE — Telephone Encounter (Signed)
Cardiac Catheterization scheduled at [facility] for: Thursday June 15, 2022 2:30 PM Arrival time and place: [place] Entrance A at: 12:30 PM  Nothing to eat after midnight prior to procedure, clear liquids until 5 AM day of procedure.  Medication instructions: -Usual morning medications can be taken with sips of water including aspirin 81 mg and Plavix 75 mg  Confirmed patient has responsible adult to drive home post procedure and be with patient first 24 hours after arriving home.  Patient reports no new symptoms concerning for COVID-19 in the past 10 days.  Reviewed procedure instructions with patient.

## 2022-06-15 ENCOUNTER — Other Ambulatory Visit: Payer: Self-pay

## 2022-06-15 ENCOUNTER — Encounter (HOSPITAL_COMMUNITY)
Admission: RE | Disposition: A | Discharge status: Home / Self Care | Payer: Self-pay | Source: Home / Self Care | Attending: Cardiovascular Disease

## 2022-06-15 ENCOUNTER — Ambulatory Visit (HOSPITAL_COMMUNITY)
Admission: RE | Admit: 2022-06-15 | Discharge: 2022-06-15 | Disposition: A | Discharge status: Home / Self Care | Payer: Medicare HMO | Attending: Cardiovascular Disease | Admitting: Cardiovascular Disease

## 2022-06-15 DIAGNOSIS — Z7902 Long term (current) use of antithrombotics/antiplatelets: Secondary | ICD-10-CM | POA: Diagnosis not present

## 2022-06-15 DIAGNOSIS — I25119 Atherosclerotic heart disease of native coronary artery with unspecified angina pectoris: Secondary | ICD-10-CM | POA: Diagnosis not present

## 2022-06-15 DIAGNOSIS — I2582 Chronic total occlusion of coronary artery: Secondary | ICD-10-CM | POA: Diagnosis not present

## 2022-06-15 DIAGNOSIS — I10 Essential (primary) hypertension: Secondary | ICD-10-CM | POA: Insufficient documentation

## 2022-06-15 DIAGNOSIS — Z79899 Other long term (current) drug therapy: Secondary | ICD-10-CM | POA: Diagnosis not present

## 2022-06-15 DIAGNOSIS — Z7982 Long term (current) use of aspirin: Secondary | ICD-10-CM | POA: Insufficient documentation

## 2022-06-15 DIAGNOSIS — E782 Mixed hyperlipidemia: Secondary | ICD-10-CM | POA: Insufficient documentation

## 2022-06-15 DIAGNOSIS — I252 Old myocardial infarction: Secondary | ICD-10-CM | POA: Diagnosis not present

## 2022-06-15 DIAGNOSIS — I700 Atherosclerosis of aorta: Secondary | ICD-10-CM | POA: Insufficient documentation

## 2022-06-15 DIAGNOSIS — Z955 Presence of coronary angioplasty implant and graft: Secondary | ICD-10-CM | POA: Insufficient documentation

## 2022-06-15 HISTORY — PX: LEFT HEART CATH AND CORONARY ANGIOGRAPHY: CATH118249

## 2022-06-15 SURGERY — LEFT HEART CATH AND CORONARY ANGIOGRAPHY
Anesthesia: LOCAL

## 2022-06-15 MED ORDER — VERAPAMIL HCL 2.5 MG/ML IV SOLN
INTRAVENOUS | Status: DC | PRN
  Administered 2022-06-15: 10 mL via INTRA_ARTERIAL

## 2022-06-15 MED ORDER — LIDOCAINE HCL (PF) 1 % IJ SOLN
INTRAMUSCULAR | Status: AC
  Filled 2022-06-15: qty 30

## 2022-06-15 MED ORDER — HYDRALAZINE HCL 20 MG/ML IJ SOLN: 10.0000 mg | INTRAMUSCULAR | Status: DC | PRN

## 2022-06-15 MED ORDER — SODIUM CHLORIDE 0.9 % IV SOLN: 250.0000 mL | INTRAVENOUS | Status: DC | PRN

## 2022-06-15 MED ORDER — FENTANYL CITRATE (PF) 100 MCG/2ML IJ SOLN
INTRAMUSCULAR | Status: AC
  Filled 2022-06-15: qty 2

## 2022-06-15 MED ORDER — SODIUM CHLORIDE 0.9 % WEIGHT BASED INFUSION: 1.0000 mL/kg/h | INTRAVENOUS | Status: DC

## 2022-06-15 MED ORDER — SODIUM CHLORIDE 0.9% FLUSH: 3.0000 mL | INTRAVENOUS | Status: DC | PRN

## 2022-06-15 MED ORDER — MIDAZOLAM HCL 2 MG/2ML IJ SOLN
INTRAMUSCULAR | Status: DC | PRN
  Administered 2022-06-15: 2 mg via INTRAVENOUS

## 2022-06-15 MED ORDER — SODIUM CHLORIDE 0.9% FLUSH: 3.0000 mL | Freq: Two times a day (BID) | INTRAVENOUS | Status: DC

## 2022-06-15 MED ORDER — FENTANYL CITRATE (PF) 100 MCG/2ML IJ SOLN
INTRAMUSCULAR | Status: DC | PRN
  Administered 2022-06-15: 25 ug via INTRAVENOUS

## 2022-06-15 MED ORDER — ACETAMINOPHEN 325 MG PO TABS: 650.0000 mg | ORAL_TABLET | ORAL | Status: DC | PRN

## 2022-06-15 MED ORDER — MIDAZOLAM HCL 2 MG/2ML IJ SOLN
INTRAMUSCULAR | Status: AC
  Filled 2022-06-15: qty 2

## 2022-06-15 MED ORDER — ASPIRIN 81 MG PO CHEW: 81.0000 mg | CHEWABLE_TABLET | ORAL | Status: DC

## 2022-06-15 MED ORDER — HEPARIN (PORCINE) IN NACL 1000-0.9 UT/500ML-% IV SOLN
INTRAVENOUS | Status: AC
  Filled 2022-06-15: qty 1000

## 2022-06-15 MED ORDER — VERAPAMIL HCL 2.5 MG/ML IV SOLN
INTRAVENOUS | Status: AC
  Filled 2022-06-15: qty 2

## 2022-06-15 MED ORDER — HEPARIN SODIUM (PORCINE) 1000 UNIT/ML IJ SOLN
INTRAMUSCULAR | Status: DC | PRN
  Administered 2022-06-15: 5000 [IU] via INTRAVENOUS

## 2022-06-15 MED ORDER — LABETALOL HCL 5 MG/ML IV SOLN: 10.0000 mg | INTRAVENOUS | Status: DC | PRN

## 2022-06-15 MED ORDER — LIDOCAINE HCL (PF) 1 % IJ SOLN
INTRAMUSCULAR | Status: DC | PRN
  Administered 2022-06-15: 2 mL

## 2022-06-15 MED ORDER — HEPARIN SODIUM (PORCINE) 1000 UNIT/ML IJ SOLN
INTRAMUSCULAR | Status: AC
  Filled 2022-06-15: qty 10

## 2022-06-15 MED ORDER — SODIUM CHLORIDE 0.9 % WEIGHT BASED INFUSION
3.0000 mL/kg/h | INTRAVENOUS | Status: AC
  Administered 2022-06-15: 3 mL/kg/h via INTRAVENOUS

## 2022-06-15 MED ORDER — HEPARIN (PORCINE) IN NACL 1000-0.9 UT/500ML-% IV SOLN
INTRAVENOUS | Status: DC | PRN
  Administered 2022-06-15 (×2): 500 mL

## 2022-06-15 MED ORDER — IOHEXOL 350 MG/ML SOLN
INTRAVENOUS | Status: DC | PRN
  Administered 2022-06-15: 35 mL

## 2022-06-15 MED ORDER — ONDANSETRON HCL 4 MG/2ML IJ SOLN: 4.0000 mg | Freq: Four times a day (QID) | INTRAMUSCULAR | Status: DC | PRN

## 2022-06-15 SURGICAL SUPPLY — 10 items
BAND CMPR LRG ZPHR (HEMOSTASIS) ×1
BAND ZEPHYR COMPRESS 30 LONG (HEMOSTASIS) IMPLANT
CATH 5FR JL3.5 JR4 ANG PIG MP (CATHETERS) IMPLANT
GLIDESHEATH SLEND SS 6F .021 (SHEATH) IMPLANT
GUIDEWIRE INQWIRE 1.5J.035X260 (WIRE) IMPLANT
INQWIRE 1.5J .035X260CM (WIRE) ×1
KIT HEART LEFT (KITS) ×1 IMPLANT
PACK CARDIAC CATHETERIZATION (CUSTOM PROCEDURE TRAY) ×1 IMPLANT
TRANSDUCER W/STOPCOCK (MISCELLANEOUS) ×1 IMPLANT
TUBING CIL FLEX 10 FLL-RA (TUBING) ×1 IMPLANT

## 2022-06-15 NOTE — Progress Notes (Signed)
TR BAND REMOVAL  LOCATION:    right radial  DEFLATED PER PROTOCOL:    Yes.    TIME BAND OFF / DRESSING APPLIED: 06/15/22 at [facility] ARRIVAL:    Level 0  SITE AFTER BAND REMOVAL:    Level 0  CIRCULATION SENSATION AND MOVEMENT:    Within Normal Limits   Yes.    COMMENTS:

## 2022-06-15 NOTE — Interval H&P Note (Signed)
History and Physical Interval Note:  06/15/2022 4:16 PM  Austin [name]  has presented today for surgery, with the diagnosis of Chest Pain.  The various methods of treatment have been discussed with the patient and family. After consideration of risks, benefits and other options for treatment, the patient has consented to  Procedure(s): LEFT HEART CATH AND CORONARY ANGIOGRAPHY (N/A) as a surgical intervention.  The patient's history has been reviewed, patient examined, no change in status, stable for surgery.  I have reviewed the patient's chart and labs.  Questions were answered to the patient's satisfaction.     Sherren Mocha

## 2022-06-16 ENCOUNTER — Encounter (HOSPITAL_COMMUNITY): Payer: Self-pay | Admitting: Cardiovascular Disease

## 2022-06-16 NOTE — Progress Notes (Signed)
Pt has been made aware of normal result and verbalized understanding.  jw

## 2022-06-23 ENCOUNTER — Other Ambulatory Visit: Payer: Self-pay | Admitting: Cardiovascular Disease

## 2022-06-28 ENCOUNTER — Encounter: Payer: Self-pay | Admitting: Physician Assistant

## 2022-06-28 ENCOUNTER — Ambulatory Visit: Payer: Medicare HMO | Attending: Physician Assistant | Admitting: Physician Assistant

## 2022-06-28 VITALS — BP 132/62 | HR 68 | Ht 73.0 in | Wt 195.8 lb

## 2022-06-28 DIAGNOSIS — I25119 Atherosclerotic heart disease of native coronary artery with unspecified angina pectoris: Secondary | ICD-10-CM | POA: Diagnosis not present

## 2022-06-28 DIAGNOSIS — E782 Mixed hyperlipidemia: Secondary | ICD-10-CM

## 2022-06-28 DIAGNOSIS — I10 Essential (primary) hypertension: Secondary | ICD-10-CM

## 2022-06-28 NOTE — Patient Instructions (Signed)
Medication Instructions:  Your physician recommends that you continue on your current medications as directed. Please refer to the Current Medication list given to you today.  *If you need a refill on your cardiac medications before your next appointment, please call your pharmacy*   Lab Work:  None ordered  If you have labs (blood work) drawn today and your tests are completely normal, you will receive your results only by: [facility] (if you have MyChart) OR A paper copy in the mail If you have any lab test that is abnormal or we need to change your treatment, we will call you to review the results.   Testing/Procedures: None ordered   Follow-Up: At [facility], you and your health needs are our priority.  As part of our continuing mission to provide you with exceptional heart care, we have created designated Provider Care Teams.  These Care Teams include your primary Cardiologist (physician) and Advanced Practice Providers (APPs -  Physician Assistants and Nurse Practitioners) who all work together to provide you with the care you need, when you need it.  We recommend signing up for the patient portal called "MyChart".  Sign up information is provided on this After Visit Summary.  MyChart is used to connect with patients for Virtual Visits (Telemedicine).  Patients are able to view lab/test results, encounter notes, upcoming appointments, etc.  Non-urgent messages can be sent to your provider as well.   To learn more about what you can do with MyChart, go to [email or web address].    Your next appointment:   6 month(s)  The format for your next appointment:   In Person  Provider:   Sherren Mocha, MD     Other Instructions   Important Information About Sugar

## 2022-06-28 NOTE — Progress Notes (Signed)
Cardiology Office Note:    Date:  06/28/2022   ID:  [name], DOB 02-Jun-1946, MRN [id number]  PCP:  Binnie Rail, MD  [place] Prov[facility] Cardiologist:  Sherren Mocha, MD Cardiology APP:  Sharmon Revere    Referring MD: Binnie Rail, MD   Chief Complaint:  Hospitalization Follow-up (S/p cardiac catheterization )    Patient Profile: Coronary artery disease S/p Lat MI in 2012 tx with BMS to LCx PCI 4/17: DES to prox LAD PCI 7/17: DES to [place] 12/19: ant-lat infarct, no ischemia PCI 03/2022: s/p 3 x 16 mm DES to OM1 Cath 06/15/22: LAD stent patent; apical LAD 80-90 (med Rx), small D1, D2 90 (med Rx); OM1 stent patent; mLCx 100 (CTO); pRCA 37, dRCA 49, oRPDA 80; multiple PDA/PLA branches w severe dz - Med Rx Follicular lymphoma, Gr I-II (Dr. Gloriann Loan, [facility]) Hypertension Hyperlipidemia ORION 4 Trial participant Hx of TIA Aortic atherosclerosis  Cardiac Studies & Procedures   CARDIAC CATHETERIZATION  CARDIAC CATHETERIZATION 06/15/2022  Narrative Unchanged coronary anatomy from recent cardiac cath procedure with small vessel disease involving the diagonal branches of the LAD and the terminal branches of the RCA.  There is severe disease in those vessels, all of which are 1 mm or less in caliber.  The LAD is patent with mild to moderate stenosis but no focal severe lesions.  The recently implanted circumflex stent is patent.  The RCA is patent with mild nonobstructive disease.  Recommend continue current medical therapy.  Can increase antianginals if needed.  Findings Coronary Findings Diagnostic  Dominance: Right  Left Anterior Descending The proximal LAD stented segment remains widely patent.  There is mild diffuse disease to the mid vessel.  The apical LAD has stable 80 to 90% stenosis unchanged from the prior study.  This is all unchanged from the recent cath study.  There is mild to moderate mid vessel stenosis  present. Non-stenotic Prox LAD lesion was previously treated. Dist LAD lesion is 90% stenosed. Small vessel, apical portion of LAD, unchanged from previous study  Lateral First Diagonal Branch Lat 1st Diag lesion is 90% stenosed. Small vessel unchanged from previous study  Second Diagonal Branch 2nd Diag lesion is 90% stenosed. Small vessel unchanged from previous study  Left Circumflex AV circumflex is occluded distally and this is essentially unchanged from the previous study. The first OM branch of the circumflex has new severe stenosis off of the distal edge of a previously implanted stent.  The recently implanted stent is widely patent with no restenosis.  The edges of the stent are patent with no stenosis. Collaterals Dist Cx filled by collaterals from Inf Sept.  Mid Cx lesion is 100% stenosed. Chronic occlusion unchanged from previous study  First Obtuse Marginal Branch Non-stenotic 1st Mrg-1 lesion was previously treated. TIMI-2 flow, eccentric plaque, new from last study Previously placed 1st Mrg-2 stent of unknown type is  widely patent. Non-stenotic 1st Mrg-3 lesion was previously treated. Vessel is the culprit lesion.  Third Obtuse Marginal Branch Collaterals 3rd Mrg filled by collaterals from Inf Sept.  Right Coronary Artery There is mild diffuse disease throughout the vessel. The RCA is diffusely diseased.  The proximal vessel has 40 to 50% stenosis in the distal vessel has 50% stenosis.  The vessel divides into multiple PDA and PLA branches with complex severe stenosis involving these bifurcations, not suitable for PCI.  The disease distribution in the RCA is unchanged from the recent cath study. Prox  RCA lesion is 40% stenosed. Dist RCA lesion is 50% stenosed.  Right Posterior Descending Artery Ost RPDA to RPDA lesion is 50% stenosed. Unchanged from previous study RPDA lesion is 80% stenosed. The lesion is discrete. Small vessel, unchanged from previous  study  Intervention  No interventions have been documented.   CARDIAC CATHETERIZATION  CARDIAC CATHETERIZATION 03/30/2022  Narrative   Dist LAD lesion is 90% stenosed.   Mid Cx lesion is 100% stenosed.   Lat 1st Diag lesion is 90% stenosed.   2nd Diag lesion is 90% stenosed.   Ost RPDA to RPDA lesion is 50% stenosed.   RPDA lesion is 80% stenosed.   1st Mrg-3 lesion is 95% stenosed.   Prox RCA lesion is 40% stenosed.   Dist RCA lesion is 50% stenosed.   Non-stenotic 1st Mrg-2 lesion was previously treated.   Non-stenotic Prox LAD lesion was previously treated.   Non-stenotic 1st Mrg-1 lesion was previously treated.   A drug-eluting stent was successfully placed using a SYNERGY XD 3.0X16.   Post intervention, there is a 0% residual stenosis.  1.  Severe stenosis of the first OM branch of the circumflex (distal stent edge) treated successfully with PCI using a 3.0 x 16 mm Synergy DES 2.  Continued patency of the proximal LAD stent with no evidence of significant restenosis and stable apical LAD stenosis unchanged from the previous study 3.  Patent left main with no significant stenosis 4.  Patent but diffusely diseased RCA with severe stenosis involving multiple bifurcation areas in the distal branch vessels not suitable for PCI  Recommendations: Same-day PCI protocol if criteria met, dual antiplatelet therapy with aspirin and clopidogrel minimum of 12 months favor long-term therapy in this patient with multiple PCI procedures  Findings Coronary Findings Diagnostic  Dominance: Right  Left Anterior Descending The proximal LAD stented segment remains widely patent.  There is mild diffuse disease to the mid vessel.  The apical LAD has stable 80 to 90% stenosis unchanged from the prior study Non-stenotic Prox LAD lesion was previously treated. Dist LAD lesion is 90% stenosed. Small vessel, apical portion of LAD, unchanged from previous study  Lateral First Diagonal Branch Lat 1st  Diag lesion is 90% stenosed. Small vessel unchanged from previous study  Second Diagonal Branch 2nd Diag lesion is 90% stenosed. Small vessel unchanged from previous study  Left Circumflex AV circumflex is occluded distally and this is essentially unchanged from the previous study. The first OM branch of the circumflex has new severe stenosis off of the distal edge of a previously implanted stent Collaterals Dist Cx filled by collaterals from Inf Sept.  Mid Cx lesion is 100% stenosed. Chronic occlusion unchanged from previous study  First Obtuse Marginal Branch Non-stenotic 1st Mrg-1 lesion was previously treated. TIMI-2 flow, eccentric plaque, new from last study Non-stenotic 1st Mrg-2 lesion was previously treated. 1st Mrg-3 lesion is 95% stenosed. Vessel is the culprit lesion.  Third Obtuse Marginal Branch Collaterals 3rd Mrg filled by collaterals from Inf Sept.  Right Coronary Artery There is mild diffuse disease throughout the vessel. The RCA is diffusely diseased.  The proximal vessel has 40 to 50% stenosis in the distal vessel has 50% stenosis.  The vessel divides into multiple PDA and PLA branches with complex severe stenosis involving these bifurcations, not really suitable for PCI. Prox RCA lesion is 40% stenosed. Dist RCA lesion is 50% stenosed.  Right Posterior Descending Artery Ost RPDA to RPDA lesion is 50% stenosed. Unchanged from previous study RPDA  lesion is 80% stenosed. The lesion is discrete. Small vessel, unchanged from previous study  Intervention  1st Mrg-3 lesion Stent CATH LAUNCHER 6FR EBU3.5 guide catheter was inserted. Lesion crossed with guidewire using a WIRE COUGAR XT STRL 190CM. Pre-stent angioplasty was performed using a BALLN SAPPHIRE 2.5X12. A drug-eluting stent was successfully placed using a SYNERGY XD 3.0X16. Post-stent angioplasty was performed using a BALL SAPPHIRE NC24 3.25X12. Post-Intervention Lesion Assessment The intervention was  successful. Pre-interventional TIMI flow is 3. Post-intervention TIMI flow is 3. No complications occurred at this lesion. There is a 0% residual stenosis post intervention.   STRESS TESTS  MYOCARDIAL PERFUSION IMAGING 08/12/2018  Narrative  Nuclear stress EF: 55%.  Blood pressure demonstrated a normal response to exercise.  Horizontal ST segment depression ST segment depression of 1 mm was noted during stress in the V4 and V5 leads, and returning to baseline after less than 1 minute of recovery.  This is a low risk study.  The left ventricular ejection fraction is normal (55-65%).  1. EF 55%, normal wall motion. 2. Primarily fixed medium-sized, moderate intensity basal to mid anterolateral perfusion defect with some reversibility at the base.  This appears to be primarily anterolateral infarction though there surprisingly does not appear to be a significant wall motion abnormality. 3. Borderline significant near 1 mm ST depression V4 and V5 at peak exercise, resolving quickly in recovery.  Overall, I think this is a low risk study.  Most consistent with anterolateral infarction.   ECHOCARDIOGRAM  ECHOCARDIOGRAM COMPLETE 08/30/2016  Narrative *[facility]* [address], [place] [address] [phone]  ------------------------------------------------------------------- Transthoracic Echocardiography  Patient:    Austin [name], Rout MR #:       [id number] Study Date: 08/30/2016 Gender:     M Age:        76 Height:     185.4 cm Weight:     88.7 kg BSA:        2.15 m^2 Pt. Status: Room:       [place], Inpatient SONOGRAPHER  Mikki Santee  cc:  ------------------------------------------------------------------- LV EF: 55% -    60%  ------------------------------------------------------------------- Indications:      Dyspnea 786.09.  ------------------------------------------------------------------- History:   PMH:   Murmur.  Coronary artery disease.  PMH: Myocardial infarction.  Transient ischemic attack.  Risk factors: Hypertension. Dyslipidemia.  ------------------------------------------------------------------- Study Conclusions  - Left ventricle: The cavity size was normal. Wall thickness was normal. Systolic function was normal. The estimated ejection fraction was in the range of 55% to 60%. Although no diagnostic regional wall motion abnormality was identified, this possibility cannot be completely excluded on the basis of this study. Doppler parameters are consistent with abnormal left ventricular relaxation (grade 1 diastolic dysfunction). - Aortic valve: There was no stenosis. There was trivial regurgitation. - Aorta: Dilated aortic root. Aortic root dimension: 42 mm (ED). - Mitral valve: There was no significant regurgitation. - Right ventricle: The cavity size was normal. Systolic function was normal. - Pericardium, extracardiac: A trivial pericardial effusion was identified.  Impressions:  - Normal LV size and systolic function, EF 38-93%. Normal RV size and systolic function. No significant valvular abnormalities.  ------------------------------------------------------------------- Study data:  No prior study was available for comparison.  Study status:  Routine.  Procedure:  The patient  reported no pain pre or post test. Transthoracic echocardiography. Image quality was adequate.  Study completion:  There were no complications. Transthoracic echocardiography.  M-mode, complete 2D, spectral Doppler, and color Doppler.  Birthdate:  Patient birthdate: 1945-11-08.  Age:  Patient is 76 yr old.  Sex:  Gender: male. BMI: 25.8 kg/m^2.  Blood pressure:     126/68  Patient  status: Inpatient.  Study date:  Study date: 08/30/2016. Study time: 11:07 AM.  Location:  Echo laboratory.  -------------------------------------------------------------------  ------------------------------------------------------------------- Left ventricle:  The cavity size was normal. Wall thickness was normal. Systolic function was normal. The estimated ejection fraction was in the range of 55% to 60%. Although no diagnostic regional wall motion abnormality was identified, this possibility cannot be completely excluded on the basis of this study. Doppler parameters are consistent with abnormal left ventricular relaxation (grade 1 diastolic dysfunction).  ------------------------------------------------------------------- Aortic valve:   Trileaflet.  Doppler:   There was no stenosis. There was trivial regurgitation.  ------------------------------------------------------------------- Aorta:  Dilated aortic root.  ------------------------------------------------------------------- Mitral valve:   Normal thickness leaflets .  Doppler:   There was no evidence for stenosis.   There was no significant regurgitation.  ------------------------------------------------------------------- Left atrium:  The atrium was normal in size.  ------------------------------------------------------------------- Right ventricle:  The cavity size was normal. Systolic function was normal.  ------------------------------------------------------------------- Pulmonic valve:    Structurally normal valve.   Cusp separation was normal.  Doppler:  Transvalvular velocity was within the normal range. There was trivial regurgitation.  ------------------------------------------------------------------- Tricuspid valve:   Doppler:  There was trivial regurgitation.  ------------------------------------------------------------------- Right atrium:  The atrium was normal in  size.  ------------------------------------------------------------------- Pericardium:  A trivial pericardial effusion was identified.  ------------------------------------------------------------------- Post procedure conclusions Ascending Aorta:  - Dilated aortic root.  ------------------------------------------------------------------- Measurements  Left ventricle                         Value        Reference LV ID, ED, PLAX chordal        (H)     52.3  mm     43 - 52 LV ID, ES, PLAX chordal                36.3  mm     23 - 38 LV fx shortening, PLAX chordal         31    %      >=29 LV PW thickness, ED                    11    mm     ---------- IVS/LV PW ratio, ED                    0.86         <=1.3 LV e&', lateral                         6.31  cm/s   ---------- LV e&', medial                          4.79  cm/s   ---------- LV e&', average                         5.55  cm/s   ----------  Ventricular septum  Value        Reference IVS thickness, ED                      9.47  mm     ----------  LVOT                                   Value        Reference LVOT ID, S                             22    mm     ---------- LVOT area                              3.8   cm^2   ----------  Aorta                                  Value        Reference Aortic root ID, ED                     42    mm     ----------  Left atrium                            Value        Reference LA ID, A-P, ES                         35    mm     ---------- LA ID/bsa, A-P                         1.63  cm/m^2 <=2.2 LA volume, S                           51.6  ml     ---------- LA volume/bsa, S                       24    ml/m^2 ---------- LA volume, ES, 1-p A4C                 42.9  ml     ---------- LA volume/bsa, ES, 1-p A4C             20    ml/m^2 ---------- LA volume, ES, 1-p A2C                 61.3  ml     ---------- LA volume/bsa, ES, 1-p A2C             28.6  ml/m^2  ----------  Tricuspid valve                        Value        Reference Tricuspid regurg peak velocity         171   cm/s   ---------- Tricuspid peak RV-RA gradient          12    mm Hg  ----------  Right atrium  Value        Reference RA ID, S-I, ES, A4C            (H)     53.3  mm     34 - 49 RA area, ES, A4C                       13.4  cm^2   8.3 - 19.5 RA volume, ES, A/L                     23.6  ml     ---------- RA volume/bsa, ES, A/L                 11    ml/m^2 ----------  Right ventricle                        Value        Reference TAPSE                                  21.7  mm     ---------- RV s&', lateral, S                      14.8  cm/s   ----------  Legend: (L)  and  (H)  mark values outside specified reference range.  ------------------------------------------------------------------- Prepared and Electronically Authenticated by  Loralie Champagne, M.D. 2018-01-03T14:22:56              History of Present Illness:   JANIEL CRISOSTOMO is a 76 y.o. male with the above problem list.  He was last seen 06/13/2022.  He was having recurrent symptoms of angina after a period of improvement following his most recent PCI.  Cardiac catheterization was arranged.  This was performed 06/15/2022 by Dr. Burt Knack and demonstrated stable anatomy.  His OM1 and LAD stents were patent.  He has small vessel disease involving the diagonal branches and terminal branches of the RCA and distal LAD which is managed medically.  He returns for follow-up.  He is here alone.  He is doing well without any further chest discomfort, shortness of breath.  He is back to working out the gym.  He had no significant symptoms earlier today.  He does cardio and weights.     Past Medical History:  Diagnosis Date   Allergy    Arthritis    CAP (community acquired pneumonia)    Cataract    bilateral   Coronary artery disease    03/13/2016 DES to first OM, EF 55% // Myoview 12/19: EF  55, ant-lat infarction, no significant ischemia, Low Risk   Heart murmur    "outgrew it" (03/13/2016)   Hematuria    HTN (hypertension)    Hx of cardiovascular stress test    ETT-Myoview (10/15): Nondiagnostic EKG changes, anterolateral and inferolateral scar, no ischemia, EF 48%, low risk   Hyperlipemia    Hyperlipidemia    NMR Lipoprofile 2008: LDL 168 ( 2410/ 1826), HDL 41, TG 71. LDL goal = <100, ideally <  70. Framingham Study LDL goal = < 130.   Kidney stones    "passed them"; Dr. Terance Hart (03/13/2016)   Myocardial infarction [facility]) 2001   [facility]'s [facility]   Pleural effusion    TIA (transient ischemic attack) 1980s   "  I was exercising when I had it"   Current Medications: Current Meds  Medication Sig   Alcohol Swabs (B-D SINGLE USE SWABS REGULAR) PADS 1 each by Does not apply route as needed.   amLODipine (NORVASC) 10 MG tablet TAKE 1 TABLET EVERY DAY (PLEASE KEEP UPCOMING APPOINTMENT FOR FUTURE REFILLS)   Ascorbic Acid (VITAMIN C PO) Take 1 tablet by mouth 2 (two) times a week.   aspirin 81 MG tablet Take 1 tablet (81 mg total) by mouth daily.   Aspirin-Caffeine (BAYER BACK & BODY) 500-32.5 MG TABS Take 1 tablet by mouth daily as needed (pain).   clopidogrel (PLAVIX) 75 MG tablet TAKE 1 TABLET EVERY DAY   dorzolamide-timolol (COSOPT) 22.3-6.8 MG/ML ophthalmic solution Place 1 drop into both eyes 2 (two) times daily.    latanoprost (XALATAN) 0.005 % ophthalmic solution Place 1 drop into both eyes at bedtime.   Menthol-Methyl Salicylate (MUSCLE RUB) 10-15 % CREA Apply 1 Application topically as needed for muscle pain.   nitroGLYCERIN (NITROSTAT) 0.4 MG SL tablet PLACE 1 TABLET UNDER THE TONGUE AS NEEDED FOR CHEST PAIN   OVER THE COUNTER MEDICATION Take 2 tablets by mouth daily with lunch. Prostate Strong   Polyvinyl Alcohol-Povidone (REFRESH OP) Place 1 drop into both eyes daily as needed (dry eyes).   ranolazine (RANEXA) 500 MG 12 hr tablet Take  1 tablet (500 mg total) by mouth 2 (two) times daily.   Study - ORION 4 - inclisiran 300 mg/1.21m or placebo SQ injection (PI-[place]) Inject 1.5 mLs (300 mg total) into the skin every 6 (six) months.    Allergies:   Zetia [ezetimibe], Crestor [rosuvastatin], Isosorbide, Pravachol [pravastatin sodium], and Silver   Social History   Tobacco Use   Smoking status: Never   Smokeless tobacco: Never  Vaping Use   Vaping Use: Never used  Substance Use Topics   Alcohol use: No    Comment: drank some in my 20's"   Drug use: No    Family Hx: The patient's family history includes Cancer in his mother and sister; Diabetes in his mother; Hyperlipidemia in his mother; Prostate cancer in his father; Stroke in his father.  Review of Systems  Gastrointestinal:  Positive for constipation.     EKGs/Labs/Other Test Reviewed:    EKG:  EKG is  ordered today.  The ekg ordered today demonstrates sinus bradycardia, HR 59, normal axis, T wave inversion in 1 and aVL, inferior Q waves, QTc 388   Recent Labs: 06/13/2022: BUN 17; Creatinine, Ser 1.14; Hemoglobin 13.1; Platelets 284; Potassium 4.3; Sodium 139   Recent Lipid Panel No results for input(s): "CHOL", "TRIG", "HDL", "VLDL", "LDLCALC", "LDLDIRECT" in the last 8760 hours.    Risk Assessment/Calculations/Metrics:              Physical Exam:    VS:  BP 132/62   Pulse 68   Ht _0  (1.854 m)   Wt 195 lb 12.8 oz (88.8 kg)   SpO2 97%   BMI 25.83 kg/m     Wt Readings from Last 3 Encounters:  06/28/22 195 lb 12.8 oz (88.8 kg)  06/15/22 198 lb (89.8 kg)  06/13/22 198 lb 6.4 oz (90 kg)    Constitutional:      Appearance: Healthy appearance. Not in distress.  Neck:     Vascular: JVD normal.  Pulmonary:     Breath sounds: No wheezing. No rales.  Cardiovascular:     Normal rate. Regular rhythm. Normal S1. Normal S2.  Murmurs: There is no murmur.     Comments: R wrist without hematoma Edema:    Peripheral edema absent.   Abdominal:     Palpations: Abdomen is soft.  Skin:    General: Skin is warm and dry.  Neurological:     General: No focal deficit present.     Mental Status: Alert and oriented to person, place and time.         ASSESSMENT & PLAN:   Coronary artery disease involving native coronary artery of native heart with angina pectoris ([place]) History of myocardial infarction 2012 treated a BMS to the LCx and multiple PCI procedures since.  He has undergone stenting to the OM1, proximal LAD and most recently to the OM1 at the distal stent edge.  He also has residual severe small vessel disease in the diagonal branches and apical LAD as well as diffuse disease of the RCA involving multiple bifurcation areas in the distal branch vessels, not suitable for PCI.  Recent cardiac catheterization demonstrated stable anatomy with patent stents and continued small vessel and branch vessel disease.  He is currently doing well without recurrent anginal symptoms.  QTc is stable since starting Ranolazine. Continue amlodipine 10 mg daily, as needed nitroglycerin, ranolazine 500 mg twice daily, aspirin 81 mg daily, Plavix 75 mg daily.  Follow-up in 6 months.  Essential hypertension Blood pressure is well controlled.  Continue amlodipine 10 mg daily.  Mixed hyperlipidemia He is intolerant of statins.  He remains in the [place].             Dispo:  Return in 5 months (on 11/13/2022) for Scheduled Follow Up, w/ Dr. Burt Knack.   Medication Adjustments/Labs and Tests Ordered: Current medicines are reviewed at length with the patient today.  Concerns regarding medicines are outlined above.  Tests Ordered: Orders Placed This Encounter  Procedures   EKG 12-Lead   Medication Changes: No orders of the defined types were placed in this encounter.  Signed, Richardson Dopp, PA-C  06/28/2022 4:48 PM    [address], [place], [place]  [address] Phone: [phone]; Fax: [phone]

## 2022-06-28 NOTE — Assessment & Plan Note (Signed)
Blood pressure is well controlled.  Continue amlodipine 10 mg daily.

## 2022-06-28 NOTE — Assessment & Plan Note (Addendum)
History of myocardial infarction 2012 treated a BMS to the LCx and multiple PCI procedures since.  He has undergone stenting to the OM1, proximal LAD and most recently to the OM1 at the distal stent edge.  He also has residual severe small vessel disease in the diagonal branches and apical LAD as well as diffuse disease of the RCA involving multiple bifurcation areas in the distal branch vessels, not suitable for PCI.  Recent cardiac catheterization demonstrated stable anatomy with patent stents and continued small vessel and branch vessel disease.  He is currently doing well without recurrent anginal symptoms.  QTc is stable since starting Ranolazine. Continue amlodipine 10 mg daily, as needed nitroglycerin, ranolazine 500 mg twice daily, aspirin 81 mg daily, Plavix 75 mg daily.  Follow-up in 6 months.

## 2022-06-28 NOTE — Assessment & Plan Note (Signed)
He is intolerant of statins.  He remains in the [place] trial.

## 2022-06-29 ENCOUNTER — Other Ambulatory Visit: Payer: Self-pay

## 2022-06-29 DIAGNOSIS — M7062 Trochanteric bursitis, left hip: Secondary | ICD-10-CM | POA: Diagnosis not present

## 2022-06-29 DIAGNOSIS — M25552 Pain in left hip: Secondary | ICD-10-CM | POA: Diagnosis not present

## 2022-06-29 MED ORDER — RANOLAZINE ER 500 MG PO TB12: 500.0000 mg | ORAL_TABLET | Freq: Two times a day (BID) | ORAL | 3 refills | Status: DC

## 2022-06-30 ENCOUNTER — Ambulatory Visit (INDEPENDENT_AMBULATORY_CARE_PROVIDER_SITE_OTHER): Payer: Medicare HMO | Admitting: *Deleted

## 2022-06-30 DIAGNOSIS — Z23 Encounter for immunization: Secondary | ICD-10-CM

## 2022-07-03 DIAGNOSIS — M25552 Pain in left hip: Secondary | ICD-10-CM | POA: Diagnosis not present

## 2022-07-03 DIAGNOSIS — M25651 Stiffness of right hip, not elsewhere classified: Secondary | ICD-10-CM | POA: Diagnosis not present

## 2022-07-03 DIAGNOSIS — M25652 Stiffness of left hip, not elsewhere classified: Secondary | ICD-10-CM | POA: Diagnosis not present

## 2022-07-04 ENCOUNTER — Ambulatory Visit: Payer: Medicare HMO | Admitting: Nurse Practitioner

## 2022-07-06 DIAGNOSIS — M25651 Stiffness of right hip, not elsewhere classified: Secondary | ICD-10-CM | POA: Diagnosis not present

## 2022-07-06 DIAGNOSIS — M25652 Stiffness of left hip, not elsewhere classified: Secondary | ICD-10-CM | POA: Diagnosis not present

## 2022-07-06 DIAGNOSIS — M25552 Pain in left hip: Secondary | ICD-10-CM | POA: Diagnosis not present

## 2022-07-11 DIAGNOSIS — M25651 Stiffness of right hip, not elsewhere classified: Secondary | ICD-10-CM | POA: Diagnosis not present

## 2022-07-11 DIAGNOSIS — M25652 Stiffness of left hip, not elsewhere classified: Secondary | ICD-10-CM | POA: Diagnosis not present

## 2022-07-11 DIAGNOSIS — M25552 Pain in left hip: Secondary | ICD-10-CM | POA: Diagnosis not present

## 2022-07-18 DIAGNOSIS — M25552 Pain in left hip: Secondary | ICD-10-CM | POA: Diagnosis not present

## 2022-07-18 DIAGNOSIS — M25652 Stiffness of left hip, not elsewhere classified: Secondary | ICD-10-CM | POA: Diagnosis not present

## 2022-07-18 DIAGNOSIS — M25651 Stiffness of right hip, not elsewhere classified: Secondary | ICD-10-CM | POA: Diagnosis not present

## 2022-08-08 ENCOUNTER — Ambulatory Visit: Payer: Medicare HMO | Admitting: Physician Assistant

## 2022-08-14 DIAGNOSIS — J029 Acute pharyngitis, unspecified: Secondary | ICD-10-CM | POA: Diagnosis not present

## 2022-09-04 NOTE — Patient Instructions (Addendum)
Blood work was ordered.   The lab is on the first floor.    Medications changes include :   None    A referral was ordered for oncology at [facility].     Someone will call you to schedule an appointment.    Return in about 1 year (around 09/06/2023) for Physical Exam.   Health Maintenance, Male Adopting a healthy lifestyle and getting preventive care are important in promoting health and wellness. Ask your health care provider about: The right schedule for you to have regular tests and exams. Things you can do on your own to prevent diseases and keep yourself healthy. What should I know about diet, weight, and exercise? Eat a healthy diet  Eat a diet that includes plenty of vegetables, fruits, low-fat dairy products, and lean protein. Do not eat a lot of foods that are high in solid fats, added sugars, or sodium. Maintain a healthy weight Body mass index (BMI) is a measurement that can be used to identify possible weight problems. It estimates body fat based on height and weight. Your health care provider can help determine your BMI and help you achieve or maintain a healthy weight. Get regular exercise Get regular exercise. This is one of the most important things you can do for your health. Most adults should: Exercise for at least 150 minutes each week. The exercise should increase your heart rate and make you sweat (moderate-intensity exercise). Do strengthening exercises at least twice a week. This is in addition to the moderate-intensity exercise. Spend less time sitting. Even light physical activity can be beneficial. Watch cholesterol and blood lipids Have your blood tested for lipids and cholesterol at 78 years of age, then have this test every 5 years. You may need to have your cholesterol levels checked more often if: Your lipid or cholesterol levels are high. You are older than 78 years of age. You are at high risk for heart disease. What should I know about  cancer screening? Many types of cancers can be detected early and may often be prevented. Depending on your health history and family history, you may need to have cancer screening at various ages. This may include screening for: Colorectal cancer. Prostate cancer. Skin cancer. Lung cancer. What should I know about heart disease, diabetes, and high blood pressure? Blood pressure and heart disease High blood pressure causes heart disease and increases the risk of stroke. This is more likely to develop in people who have high blood pressure readings or are overweight. Talk with your health care provider about your target blood pressure readings. Have your blood pressure checked: Every 3-5 years if you are 18-81 years of age. Every year if you are 62 years old or older. If you are between the ages of 35 and 57 and are a current or former smoker, ask your health care provider if you should have a one-time screening for abdominal aortic aneurysm (AAA). Diabetes Have regular diabetes screenings. This checks your fasting blood sugar level. Have the screening done: Once every three years after age 61 if you are at a normal weight and have a low risk for diabetes. More often and at a younger age if you are overweight or have a high risk for diabetes. What should I know about preventing infection? Hepatitis B If you have a higher risk for hepatitis B, you should be screened for this virus. Talk with your health care provider to find out if you are at  risk for hepatitis B infection. Hepatitis C Blood testing is recommended for: Everyone born from [birth year] through 1966. Anyone with known risk factors for hepatitis C. Sexually transmitted infections (STIs) You should be screened each year for STIs, including gonorrhea and chlamydia, if: You are sexually active and are younger than 78 years of age. You are older than 78 years of age and your health care provider tells you that you are at risk for this type  of infection. Your sexual activity has changed since you were last screened, and you are at increased risk for chlamydia or gonorrhea. Ask your health care provider if you are at risk. Ask your health care provider about whether you are at high risk for HIV. Your health care provider may recommend a prescription medicine to help prevent HIV infection. If you choose to take medicine to prevent HIV, you should first get tested for HIV. You should then be tested every 3 months for as long as you are taking the medicine. Follow these instructions at home: Alcohol use Do not drink alcohol if your health care provider tells you not to drink. If you drink alcohol: Limit how much you have to 0-2 drinks a day. Know how much alcohol is in your drink. In the [place] one drink equals one 12 oz bottle of beer (355 mL), one 5 oz glass of wine (148 mL), or one 1 oz glass of hard liquor (44 mL). Lifestyle Do not use any products that contain nicotine or tobacco. These products include cigarettes, chewing tobacco, and vaping devices, such as e-cigarettes. If you need help quitting, ask your health care provider. Do not use street drugs. Do not share needles. Ask your health care provider for help if you need support or information about quitting drugs. General instructions Schedule regular health, dental, and eye exams. Stay current with your vaccines. Tell your health care provider if: You often feel depressed. You have ever been abused or do not feel safe at home. Summary Adopting a healthy lifestyle and getting preventive care are important in promoting health and wellness. Follow your health care provider's instructions about healthy diet, exercising, and getting tested or screened for diseases. Follow your health care provider's instructions on monitoring your cholesterol and blood pressure. This information is not intended to replace advice given to you by your health care provider. Make sure you discuss  any questions you have with your health care provider. Document Revised: 01/03/2021 Document Reviewed: 01/03/2021 Elsevier Patient Education  [place].

## 2022-09-04 NOTE — Progress Notes (Unsigned)
Subjective:    Patient ID: Austin [name], male    DOB: 03-Oct-1945, 77 y.o.   MRN: [id number]     HPI Berish is here for a physical exam.   Currently in a trial with a new cholesterol-lowering medication.  BP at home is 132-135 typically  No concerns.  Medications and allergies reviewed with patient and updated if appropriate.  Current Outpatient Medications on File Prior to Visit  Medication Sig Dispense Refill   Alcohol Swabs (B-D SINGLE USE SWABS REGULAR) PADS 1 each by Does not apply route as needed. 3 each 3   amLODipine (NORVASC) 10 MG tablet TAKE 1 TABLET EVERY DAY (PLEASE KEEP UPCOMING APPOINTMENT FOR FUTURE REFILLS) 90 tablet 3   Ascorbic Acid (VITAMIN C PO) Take 1 tablet by mouth 2 (two) times a week.     aspirin 81 MG tablet Take 1 tablet (81 mg total) by mouth daily. 30 tablet    Aspirin-Caffeine (BAYER BACK & BODY) 500-32.5 MG TABS Take 1 tablet by mouth daily as needed (pain).     clopidogrel (PLAVIX) 75 MG tablet TAKE 1 TABLET EVERY DAY 90 tablet 3   dorzolamide-timolol (COSOPT) 22.3-6.8 MG/ML ophthalmic solution Place 1 drop into both eyes 2 (two) times daily.      latanoprost (XALATAN) 0.005 % ophthalmic solution Place 1 drop into both eyes at bedtime.     Menthol-Methyl Salicylate (MUSCLE RUB) 10-15 % CREA Apply 1 Application topically as needed for muscle pain.     nitroGLYCERIN (NITROSTAT) 0.4 MG SL tablet PLACE 1 TABLET UNDER THE TONGUE AS NEEDED FOR CHEST PAIN 25 tablet 9   OVER THE COUNTER MEDICATION Take 2 tablets by mouth daily with lunch. Prostate Strong     Polyvinyl Alcohol-Povidone (REFRESH OP) Place 1 drop into both eyes daily as needed (dry eyes).     ranolazine (RANEXA) 500 MG 12 hr tablet Take 1 tablet (500 mg total) by mouth 2 (two) times daily. 180 tablet 3   Study - ORION 4 - inclisiran 300 mg/1.33m or placebo SQ injection (PI-Stuckey) Inject 1.5 mLs (300 mg total) into the skin every 6 (six) months. 1 mL 0   No current  facility-administered medications on file prior to visit.    Review of Systems  Constitutional:  Negative for fever.  Eyes:  Negative for visual disturbance.  Respiratory:  Negative for cough, shortness of breath and wheezing.   Cardiovascular:  Negative for chest pain, palpitations and leg swelling.  Gastrointestinal:  Positive for constipation. Negative for abdominal pain, blood in stool, diarrhea and nausea.  Genitourinary:  Negative for difficulty urinating, dysuria and hematuria.  Musculoskeletal:  Positive for arthralgias (mild). Negative for back pain.  Skin:  Negative for rash.  Neurological:  Negative for dizziness, light-headedness, numbness and headaches.  Psychiatric/Behavioral:  Negative for confusion and dysphoric mood. The patient is not nervous/anxious.        Objective:   Vitals:   09/05/22 0815  BP: (!) 140/70  Pulse: 69  Temp: 98.2 F (36.8 C)  SpO2: 97%   Filed Weights   09/05/22 0815  Weight: 201 lb (91.2 kg)   Body mass index is 26.52 kg/m.  BP Readings from Last 3 Encounters:  09/05/22 (!) 140/70  06/28/22 132/62  06/15/22 137/75    Wt Readings from Last 3 Encounters:  09/05/22 201 lb (91.2 kg)  06/28/22 195 lb 12.8 oz (88.8 kg)  06/15/22 198 lb (89.8 kg)      Physical Exam Constitutional: He  appears well-developed and well-nourished. No distress.  HENT:  Head: Normocephalic and atraumatic.  Right Ear: External ear normal.  Left Ear: External ear normal.  Mouth/Throat: Oropharynx is clear and moist.  Normal ear canals and TM b/l  Eyes: Conjunctivae and EOM are normal.  Neck: Neck supple. No tracheal deviation present. No thyromegaly present.  No carotid bruit  Cardiovascular: Normal rate, regular rhythm, normal heart sounds and intact distal pulses.   No murmur heard. Pulmonary/Chest: Effort normal and breath sounds normal. No respiratory distress. He has no wheezes. He has no rales.  Abdominal: Soft. He exhibits no distension. There  is no tenderness.  Genitourinary: deferred  Musculoskeletal: He exhibits no edema.  Lymphadenopathy:   He has no cervical adenopathy.  Skin: Skin is warm and dry. He is not diaphoretic.  Psychiatric: He has a normal mood and affect. His behavior is normal.         Assessment & Plan:   Physical exam: Screening blood work  ordered Exercise   2/week at gym 45 min to 1 hr Weight  good for age Substance abuse   none   Reviewed recommended immunizations.   Health Maintenance  Topic Date Due   DTaP/Tdap/Td (2 - Tdap) 05/16/2016   COVID-19 Vaccine (4 - 2023-24 season) 04/28/2022   Medicare Annual Wellness (AWV)  10/10/2022   Pneumonia Vaccine 35+ Years old  Completed   INFLUENZA VACCINE  Completed   Hepatitis C Screening  Completed   Zoster Vaccines- Shingrix  Completed   HPV VACCINES  Aged Out   COLONOSCOPY (Pts 45-34yr Insurance coverage will need to be confirmed)  Discontinued     See Problem List for Assessment and Plan of chronic medical problems.

## 2022-09-05 ENCOUNTER — Encounter: Payer: Self-pay | Admitting: Internal Medicine

## 2022-09-05 ENCOUNTER — Ambulatory Visit (INDEPENDENT_AMBULATORY_CARE_PROVIDER_SITE_OTHER): Payer: Medicare HMO | Admitting: Internal Medicine

## 2022-09-05 VITALS — BP 140/70 | HR 69 | Temp 98.2°F | Ht 73.0 in | Wt 201.0 lb

## 2022-09-05 DIAGNOSIS — Z8572 Personal history of non-Hodgkin lymphomas: Secondary | ICD-10-CM | POA: Diagnosis not present

## 2022-09-05 DIAGNOSIS — I10 Essential (primary) hypertension: Secondary | ICD-10-CM | POA: Diagnosis not present

## 2022-09-05 DIAGNOSIS — I700 Atherosclerosis of aorta: Secondary | ICD-10-CM | POA: Diagnosis not present

## 2022-09-05 DIAGNOSIS — Z0000 Encounter for general adult medical examination without abnormal findings: Secondary | ICD-10-CM

## 2022-09-05 DIAGNOSIS — I25119 Atherosclerotic heart disease of native coronary artery with unspecified angina pectoris: Secondary | ICD-10-CM

## 2022-09-05 DIAGNOSIS — E782 Mixed hyperlipidemia: Secondary | ICD-10-CM | POA: Diagnosis not present

## 2022-09-05 DIAGNOSIS — R7303 Prediabetes: Secondary | ICD-10-CM

## 2022-09-05 LAB — COMPREHENSIVE METABOLIC PANEL
ALT: 20 U/L (ref 0–53)
AST: 23 U/L (ref 0–37)
Albumin: 4.4 g/dL (ref 3.5–5.2)
Alkaline Phosphatase: 104 U/L (ref 39–117)
BUN: 14 mg/dL (ref 6–23)
CO2: 31 mEq/L (ref 19–32)
Calcium: 10.4 mg/dL (ref 8.4–10.5)
Chloride: 103 mEq/L (ref 96–112)
Creatinine, Ser: 1.33 mg/dL (ref 0.40–1.50)
GFR: 51.47 mL/min — ABNORMAL LOW (ref >60.00)
Glucose, Bld: 95 mg/dL (ref 70–99)
Potassium: 4.6 mEq/L (ref 3.5–5.1)
Sodium: 141 mEq/L (ref 135–145)
Total Bilirubin: 0.6 mg/dL (ref 0.2–1.2)
Total Protein: 7.9 g/dL (ref 6.0–8.3)

## 2022-09-05 LAB — CBC WITH DIFFERENTIAL/PLATELET
Basophils Absolute: 0 10*3/uL (ref 0.0–0.1)
Basophils Relative: 0.9 % (ref 0.0–3.0)
Eosinophils Absolute: 0.1 10*3/uL (ref 0.0–0.7)
Eosinophils Relative: 1.9 % (ref 0.0–5.0)
HCT: 38.7 % — ABNORMAL LOW (ref 39.0–52.0)
Hemoglobin: 13 g/dL (ref 13.0–17.0)
Lymphocytes Relative: 25.8 % (ref 12.0–46.0)
Lymphs Abs: 1 10*3/uL (ref 0.7–4.0)
MCHC: 33.7 g/dL (ref 30.0–36.0)
MCV: 90 fl (ref 78.0–100.0)
Monocytes Absolute: 0.4 10*3/uL (ref 0.1–1.0)
Monocytes Relative: 11.6 % (ref 3.0–12.0)
Neutro Abs: 2.2 10*3/uL (ref 1.4–7.7)
Neutrophils Relative %: 59.8 % (ref 43.0–77.0)
Platelets: 292 10*3/uL (ref 150.0–400.0)
RBC: 4.3 Mil/uL (ref 4.22–5.81)
RDW: 13.8 % (ref 11.5–15.5)
WBC: 3.7 10*3/uL — ABNORMAL LOW (ref 4.0–10.5)

## 2022-09-05 LAB — TSH: TSH: 3.31 u[IU]/mL (ref 0.35–5.50)

## 2022-09-05 LAB — HEMOGLOBIN A1C: Hgb A1c MFr Bld: 6.1 % (ref 4.6–6.5)

## 2022-09-05 NOTE — Assessment & Plan Note (Signed)
Chronic History of MI, multiple stents Denies chest pain, shortness of breath Statin, Zetia intolerant Currently in trial of new cholesterol-lowering medication Continue aspirin 81 mg daily, Plavix 75 mg daily, Ranexa 500 mg twice daily Blood pressure well-controlled

## 2022-09-05 NOTE — Assessment & Plan Note (Signed)
Chronic Check a1c Low sugar / carb diet Stressed regular exercise  

## 2022-09-05 NOTE — Assessment & Plan Note (Signed)
Chronic Statin, Zetia intolerant Regular exercise and healthy diet encouraged Currently in trial for new cholesterol medication

## 2022-09-05 NOTE — Assessment & Plan Note (Signed)
Chronic Intolerant of statins, Zetia Currently in a trial of a new cholesterol-lowering medication On aspirin 81 mg daily, Plavix 75 mg daily

## 2022-09-05 NOTE — Assessment & Plan Note (Signed)
Has had elevated calcium with last few blood test Recheck calcium, PTH today

## 2022-09-05 NOTE — Assessment & Plan Note (Addendum)
History of lymphoma Currently following with oncology in [place] and would like to establish in [place] so that he does not have to drive the distance Referral ordered for oncology at [facility]

## 2022-09-05 NOTE — Assessment & Plan Note (Addendum)
Chronic BP well controlled at home and overall looks to be well-controlled Continue amlodipine 10 mg daily cmp

## 2022-09-07 LAB — PTH, INTACT AND CALCIUM
Calcium: 10.2 mg/dL (ref 8.6–10.3)
PTH: 64 pg/mL (ref 16–77)

## 2022-09-11 ENCOUNTER — Encounter: Payer: Self-pay | Admitting: Internal Medicine

## 2022-09-26 ENCOUNTER — Encounter: Payer: Medicare HMO | Admitting: Oncology

## 2022-10-03 ENCOUNTER — Inpatient Hospital Stay: Payer: Medicare HMO | Attending: Oncology | Admitting: Oncology

## 2022-10-03 VITALS — BP 153/78 | HR 72 | Temp 98.2°F | Resp 18 | Ht 73.0 in | Wt 200.0 lb

## 2022-10-03 DIAGNOSIS — N400 Enlarged prostate without lower urinary tract symptoms: Secondary | ICD-10-CM

## 2022-10-03 DIAGNOSIS — Z923 Personal history of irradiation: Secondary | ICD-10-CM | POA: Diagnosis not present

## 2022-10-03 DIAGNOSIS — I10 Essential (primary) hypertension: Secondary | ICD-10-CM | POA: Diagnosis not present

## 2022-10-03 DIAGNOSIS — Z8572 Personal history of non-Hodgkin lymphomas: Secondary | ICD-10-CM | POA: Diagnosis not present

## 2022-10-03 DIAGNOSIS — I2510 Atherosclerotic heart disease of native coronary artery without angina pectoris: Secondary | ICD-10-CM

## 2022-10-03 DIAGNOSIS — Z9221 Personal history of antineoplastic chemotherapy: Secondary | ICD-10-CM

## 2022-10-03 NOTE — Progress Notes (Signed)
Denies any depression or anxiety

## 2022-10-03 NOTE — Progress Notes (Signed)
[facility] New Patient Consult   Requesting MD: Binnie Rail, [address] [place],  [place] [id number]   Austin [name] 77 y.o.  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  He was treated with rituximab for 4 doses in September 2004 with clinical improvement.  He was treated with a second course of rituximab in August 2008 followed by radiation to the bilateral neck in November 2008.  He completed treatment with Bendamustine/rituximab for 6 cycles beginning October 2009 and entered clinical remission.  There was a near CR by PET 11/10/2008 with minimal residual avidity in an abdominal mass.  Completed every 37-monthrituximab maintenance for 7 doses July 2010-March 2012.  He developed progressive disease in the ocular adnexa September 2012.  He was treated with radiation to the bilateral ocular adnexa a in September 2012.  He received radiation to the bilateral submandibular glands in June 2013.  He developed progressive disease in cervical and inguinal lymph nodes and September 2013.  He was treated with Bexxar radio immunotherapy in January 2014 and had a CR by PET in April 2014.  He has remained in clinical remission.  [facility]  [place] [address]  He is referred to establish oncology care here.  He feels well.  No complaint.  Past Medical History:  Diagnosis Date   Allergy    Arthritis    CAP (community acquired pneumonia)    Cataract    bilateral   Coronary artery disease    03/13/2016 DES to first OM, EF 55% // Myoview 12/19: EF 55, ant-lat infarction, no significant ischemia, Low Risk   Heart murmur    "outgrew it" (03/13/2016)   Hematuria    HTN (hypertension)    Hx of cardiovascular stress test    ETT-Myoview (10/15): Nondiagnostic EKG changes, anterolateral and inferolateral scar, no ischemia, EF 48%, low  risk   Hyperlipemia    Hyperlipidemia    NMR Lipoprofile 2008: LDL 168 ( 2410/ 1826), HDL 41, TG 71. LDL goal = <100, ideally <  70. Framingham Study LDL goal = < 130.   Kidney stones    "passed them"; Dr. PTerance Hart(03/13/2016)   Myocardial infarction ([facility] 2001   [facility]  Pleural effusion    TIA (transient ischemic attack) 1980s   "I was exercising when I had it"    Past Surgical History:  Procedure Laterality Date   CARDIAC CATHETERIZATION N/A 12/22/2015   Procedure: Left Heart Cath and Coronary Angiography;  Surgeon: MSherren Mocha MD;  [facility];  Service: Cardiovascular;  Laterality: N/A;   CARDIAC CATHETERIZATION N/A 12/22/2015   Procedure: Coronary Stent Intervention;  Surgeon: MSherren Mocha MD;  [facility];  Service: Cardiovascular;  Laterality: N/A;   CARDIAC CATHETERIZATION N/A 12/22/2015   Procedure: Intravascular Pressure Wire/FFR Study;  Surgeon: MSherren Mocha MD;  [facility];  Service: Cardiovascular;  Laterality: N/A;   CARDIAC CATHETERIZATION N/A 03/13/2016   Procedure: Left Heart Cath and Coronary Angiography;  Surgeon: MSherren Mocha MD;  [facility];  Service: Cardiovascular;  Laterality: N/A;   COLONOSCOPY  2002   CORONARY ANGIOPLASTY WITH STENT PLACEMENT  ~ 2013   "1 stent"   CORONARY STENT INTERVENTION N/A 03/30/2022   Procedure: CORONARY  STENT INTERVENTION;  Surgeon: Sherren Mocha, MD;  Location: [facility] CV LAB;  Service: Cardiovascular;  Laterality: N/A;   ESOPHAGOGASTRODUODENOSCOPY (EGD) WITH ESOPHAGEAL DILATION  2002   KNEE ARTHROSCOPY Left 1990s   LEFT HEART CATH AND CORONARY ANGIOGRAPHY N/A 03/30/2022   Procedure: LEFT HEART CATH AND CORONARY ANGIOGRAPHY;  Surgeon: Sherren Mocha, MD;  Location: [facility] CV LAB;  Service: Cardiovascular;  Laterality: N/A;   LEFT HEART CATH AND CORONARY ANGIOGRAPHY N/A 06/15/2022   Procedure: LEFT  HEART CATH AND CORONARY ANGIOGRAPHY;  Surgeon: Sherren Mocha, MD;  Location: [facility] CV LAB;  Service: Cardiovascular;  Laterality: N/A;    Medications: Reviewed  Allergies:  Allergies  Allergen Reactions   Zetia [Ezetimibe] Shortness Of Breath and Other (See Comments)    Chest pain   Crestor [Rosuvastatin] Other (See Comments)    Myalgias   Isosorbide Other (See Comments)    Headache, throat soreness and hoarse   Pravachol [Pravastatin Sodium] Other (See Comments)    Myalgias   Silver Rash    Family history: His mother had breast cancer.  His sister died of breast cancer in her 70s.  Social History:   He lives with his wife in [place].  He is a retired Psychologist, sport and exercise.  He does not use cigarettes or alcohol.  No transfusion history.  No risk factor for HIV or hepatitis.  ROS:   Positives include: None  A complete ROS was otherwise negative.  Physical Exam:  Blood pressure (!) 153/78, pulse 72, temperature 98.2 F (36.8 C), temperature source Oral, resp. rate 18, height '6\' 1"'$  (1.854 m), weight 200 lb (90.7 kg), SpO2 100 %.  HEENT: Pharynx without visible mass, neck without mass Lungs: Decreased breath sounds at the right lower posterior chest, no respiratory distress, end inspiratory fine rales at the left lower posterior chest Cardiac: Regular rate and rhythm Abdomen: No hepatosplenomegaly, no mass, nontender  Vascular: No leg edema Lymph nodes: No cervical, supraclavicular, axillary, or inguinal nodes Neurologic: Alert and oriented, the motor exam appears intact in the upper and lower extremities bilaterally Skin: No rash Musculoskeletal: No spine tenderness   LAB:  CBC  Lab Results  Component Value Date   WBC 3.7 (L) 09/05/2022   HGB 13.0 09/05/2022   HCT 38.7 (L) 09/05/2022   MCV 90.0 09/05/2022   PLT 292.0 09/05/2022   NEUTROABS 2.2 09/05/2022        CMP  Lab Results  Component Value Date   NA 141 09/05/2022   K 4.6 09/05/2022    CL 103 09/05/2022   CO2 31 09/05/2022   GLUCOSE 95 09/05/2022   BUN 14 09/05/2022   CREATININE 1.33 09/05/2022   CALCIUM 10.2 09/05/2022   CALCIUM 10.4 09/05/2022   PROT 7.9 09/05/2022   ALBUMIN 4.4 09/05/2022   AST 23 09/05/2022   ALT 20 09/05/2022   ALKPHOS 104 09/05/2022   BILITOT 0.6 09/05/2022   GFRNONAA >60 04/17/2019   GFRAA >60 04/17/2019       Assessment/Plan:   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, FLIPI 29 April 2003-rituximab weekly x 4 with clinical response August 2008 rituximab weekly x 4-clinical response 07/11/2007 - 07/12/2007-radiation to the bilateral neck October 2009-April 2010 Bendamustine/rituximab x 6 monthly cycles 11/10/2008-near CR by PET (minimal residual avidity and abdominal mass) July 2010-March 2012 rituximab every 3 months for 7 doses August 2012 progression and ocular adnexa a September 2012 radiation, 200 cGy to bilateral ocular adnexa May 2013 progression isolated progression  submandibular glands June 2013 radiation, 2 Pearline Cables to bilateral submandibular glands September 2013 progression in cervical and inguinal lymph nodes January 2014 Bexxar radioimmunotherapy 75 mCi delivering 65 centigray total body dose 12/18/2012 CR by PET Coronary artery disease, status post myocardial infarction, coronary stents in place Hypertension 4.   history of kidney stones 5.   Hyperplasia of prostate    Disposition:   Austin [name] was diagnosed with follicular lymphoma in 1278.  He has completed multiple treatments with systemic therapy and radiation for relapsed lymphoma.  He was last treated with radio immunotherapy in January 2014.  He entered clinical remission after the radio immunotherapy.  Austin [name] remains in clinical remission from [place].  He will return for an office visit in 1 year.  He will call in the interim for new symptoms.  We recommend he remain up-to-date on influenza, COVID-19, and pneumonia  vaccines.  Betsy Coder, MD  10/03/2022, 4:28 PM

## 2022-10-04 ENCOUNTER — Encounter: Payer: Self-pay | Admitting: *Deleted

## 2022-10-13 ENCOUNTER — Ambulatory Visit (INDEPENDENT_AMBULATORY_CARE_PROVIDER_SITE_OTHER): Payer: Medicare HMO

## 2022-10-13 VITALS — Ht 73.0 in | Wt 200.0 lb

## 2022-10-13 DIAGNOSIS — Z0000 Encounter for general adult medical examination without abnormal findings: Secondary | ICD-10-CM

## 2022-10-13 NOTE — Patient Instructions (Signed)
Austin [name] , Thank you for taking time to come for your Medicare Wellness Visit. I appreciate your ongoing commitment to your health goals. Please review the following plan we discussed and let me know if I can assist you in the future.   These are the goals we discussed:  Goals       Manage My Medicine      Timeframe:  Long-Range Goal Priority:  Medium Start Date:    02/11/21                         Expected End Date:  02/11/22                     Follow Up Date Dec 2022   - call for medicine refill 2 or 3 days before it runs out - call if I am sick and can't take my medicine - keep a list of all the medicines I take; vitamins and herbals too - use a pillbox to sort medicine    Why is this important?   These steps will help you keep on track with your medicines.   Notes:       Patient Stated (pt-stated)      To maintain my current health status by continuing to eat healthy, stay physically active and socially active.      Patient Stated      Would like to keep weight at 200 And keep going to the gym         This is a list of the screening recommended for you and due dates:  Health Maintenance  Topic Date Due   DTaP/Tdap/Td vaccine (2 - Tdap) 05/16/2016   COVID-19 Vaccine (4 - 2023-24 season) 04/28/2022   Medicare Annual Wellness Visit  10/14/2023   Pneumonia Vaccine  Completed   Flu Shot  Completed   Hepatitis C Screening: USPSTF Recommendation to screen - Ages 18-79 yo.  Completed   Zoster (Shingles) Vaccine  Completed   HPV Vaccine  Aged Out   Colon Cancer Screening  Discontinued    Advanced directives: Please bring a copy of your health care power of attorney and living will to the office to be added to your chart at your convenience.   Conditions/risks identified: Aim for 30 minutes of exercise or brisk walking, 6-8 glasses of water, and 5 servings of fruits and vegetables each day.   Next appointment: Follow up in one year for your annual wellness visit.    Preventive Care 71 Years and Older, Male  Preventive care refers to lifestyle choices and visits with your health care provider that can promote health and wellness. What does preventive care include? A yearly physical exam. This is also called an annual well check. Dental exams once or twice a year. Routine eye exams. Ask your health care provider how often you should have your eyes checked. Personal lifestyle choices, including: Daily care of your teeth and gums. Regular physical activity. Eating a healthy diet. Avoiding tobacco and drug use. Limiting alcohol use. Practicing safe sex. Taking low doses of aspirin every day. Taking vitamin and mineral supplements as recommended by your health care provider. What happens during an annual well check? The services and screenings done by your health care provider during your annual well check will depend on your age, overall health, lifestyle risk factors, and family history of disease. Counseling  Your health care provider may ask you questions about your: Alcohol  use. Tobacco use. Drug use. Emotional well-being. Home and relationship well-being. Sexual activity. Eating habits. History of falls. Memory and ability to understand (cognition). Work and work Statistician. Screening  You may have the following tests or measurements: Height, weight, and BMI. Blood pressure. Lipid and cholesterol levels. These may be checked every 5 years, or more frequently if you are over 84 years old. Skin check. Lung cancer screening. You may have this screening every year starting at age 82 if you have a 30-pack-year history of smoking and currently smoke or have quit within the past 15 years. Fecal occult blood test (FOBT) of the stool. You may have this test every year starting at age 15. Flexible sigmoidoscopy or colonoscopy. You may have a sigmoidoscopy every 5 years or a colonoscopy every 10 years starting at age 75. Prostate cancer  screening. Recommendations will vary depending on your family history and other risks. Hepatitis C blood test. Hepatitis B blood test. Sexually transmitted disease (STD) testing. Diabetes screening. This is done by checking your blood sugar (glucose) after you have not eaten for a while (fasting). You may have this done every 1-3 years. Abdominal aortic aneurysm (AAA) screening. You may need this if you are a current or former smoker. Osteoporosis. You may be screened starting at age 50 if you are at high risk. Talk with your health care provider about your test results, treatment options, and if necessary, the need for more tests. Vaccines  Your health care provider may recommend certain vaccines, such as: Influenza vaccine. This is recommended every year. Tetanus, diphtheria, and acellular pertussis (Tdap, Td) vaccine. You may need a Td booster every 10 years. Zoster vaccine. You may need this after age 5. Pneumococcal 13-valent conjugate (PCV13) vaccine. One dose is recommended after age 60. Pneumococcal polysaccharide (PPSV23) vaccine. One dose is recommended after age 40. Talk to your health care provider about which screenings and vaccines you need and how often you need them. This information is not intended to replace advice given to you by your health care provider. Make sure you discuss any questions you have with your health care provider. Document Released: 09/10/2015 Document Revised: 05/03/2016 Document Reviewed: 06/15/2015 Elsevier Interactive Patient Education  2017 [place] Prevention in the Home Falls can cause injuries. They can happen to people of all ages. There are many things you can do to make your home safe and to help prevent falls. What can I do on the outside of my home? Regularly fix the edges of walkways and driveways and fix any cracks. Remove anything that might make you trip as you walk through a door, such as a raised step or threshold. Trim any  bushes or trees on the path to your home. Use bright outdoor lighting. Clear any walking paths of anything that might make someone trip, such as rocks or tools. Regularly check to see if handrails are loose or broken. Make sure that both sides of any steps have handrails. Any raised decks and porches should have guardrails on the edges. Have any leaves, snow, or ice cleared regularly. Use sand or salt on walking paths during winter. Clean up any spills in your garage right away. This includes oil or grease spills. What can I do in the bathroom? Use night lights. Install grab bars by the toilet and in the tub and shower. Do not use towel bars as grab bars. Use non-skid mats or decals in the tub or shower. If you need to sit down  in the shower, use a plastic, non-slip stool. Keep the floor dry. Clean up any water that spills on the floor as soon as it happens. Remove soap buildup in the tub or shower regularly. Attach bath mats securely with double-sided non-slip rug tape. Do not have throw rugs and other things on the floor that can make you trip. What can I do in the bedroom? Use night lights. Make sure that you have a light by your bed that is easy to reach. Do not use any sheets or blankets that are too big for your bed. They should not hang down onto the floor. Have a firm chair that has side arms. You can use this for support while you get dressed. Do not have throw rugs and other things on the floor that can make you trip. What can I do in the kitchen? Clean up any spills right away. Avoid walking on wet floors. Keep items that you use a lot in easy-to-reach places. If you need to reach something above you, use a strong step stool that has a grab bar. Keep electrical cords out of the way. Do not use floor polish or wax that makes floors slippery. If you must use wax, use non-skid floor wax. Do not have throw rugs and other things on the floor that can make you trip. What can I do  with my stairs? Do not leave any items on the stairs. Make sure that there are handrails on both sides of the stairs and use them. Fix handrails that are broken or loose. Make sure that handrails are as long as the stairways. Check any carpeting to make sure that it is firmly attached to the stairs. Fix any carpet that is loose or worn. Avoid having throw rugs at the top or bottom of the stairs. If you do have throw rugs, attach them to the floor with carpet tape. Make sure that you have a light switch at the top of the stairs and the bottom of the stairs. If you do not have them, ask someone to add them for you. What else can I do to help prevent falls? Wear shoes that: Do not have high heels. Have rubber bottoms. Are comfortable and fit you well. Are closed at the toe. Do not wear sandals. If you use a stepladder: Make sure that it is fully opened. Do not climb a closed stepladder. Make sure that both sides of the stepladder are locked into place. Ask someone to hold it for you, if possible. Clearly mark and make sure that you can see: Any grab bars or handrails. First and last steps. Where the edge of each step is. Use tools that help you move around (mobility aids) if they are needed. These include: Canes. Walkers. Scooters. Crutches. Turn on the lights when you go into a dark area. Replace any light bulbs as soon as they burn out. Set up your furniture so you have a clear path. Avoid moving your furniture around. If any of your floors are uneven, fix them. If there are any pets around you, be aware of where they are. Review your medicines with your doctor. Some medicines can make you feel dizzy. This can increase your chance of falling. Ask your doctor what other things that you can do to help prevent falls. This information is not intended to replace advice given to you by your health care provider. Make sure you discuss any questions you have with your health care  provider. Document  Released: 06/10/2009 Document Revised: 01/20/2016 Document Reviewed: 09/18/2014 Elsevier Interactive Patient Education  2017 [employer].

## 2022-10-13 NOTE — Progress Notes (Signed)
Subjective:   Austin [name] is a 77 y.o. male who presents for Medicare Annual/Subsequent preventive examination. I connected with  [name] on 10/13/22 by a audio enabled telemedicine application and verified that I am speaking with the correct person using two identifiers.  Patient Location: Home  Provider Location: Home Office  I discussed the limitations of evaluation and management by telemedicine. The patient expressed understanding and agreed to proceed.  Review of Systems     Cardiac Risk Factors include: advanced age (>64mn, >>49women);hypertension;male gender;dyslipidemia     Objective:    Today's Vitals   10/13/22 0852  Weight: 200 lb (90.7 kg)  Height: 6' 1"$  (1.854 m)   Body mass index is 26.39 kg/m.     10/13/2022    8:55 AM 10/03/2022    1:37 PM 06/15/2022    1:21 PM 10/10/2021    1:16 PM 10/10/2021   12:52 PM 09/17/2020    2:25 PM 04/17/2019    4:12 PM  Advanced Directives  Does Patient Have a Medical Advance Directive? Yes Yes Yes Yes Yes Yes No  Type of AParamedicof ARankinLiving will [place]Living will [place]rivacaLiving will Living will Living will Living will;Healthcare Power of Attorney   Does patient want to make changes to medical advance directive?  No - Patient declined    No - Patient declined   Copy of [place] Chart? No - copy requested No - copy requested    No - copy requested   Would patient like information on creating a medical advance directive?       No - Patient declined    Current Medications (verified) Outpatient Encounter Medications as of 10/13/2022  Medication Sig   Alcohol Swabs (B-D SINGLE USE SWABS REGULAR) PADS 1 each by Does not apply route as needed.   amLODipine (NORVASC) 10 MG tablet TAKE 1 TABLET EVERY DAY (PLEASE KEEP UPCOMING APPOINTMENT FOR FUTURE REFILLS)   Ascorbic Acid (VITAMIN C PO) Take 1 tablet by mouth 2 (two)  times a week.   aspirin 81 MG tablet Take 1 tablet (81 mg total) by mouth daily.   Aspirin-Caffeine (BAYER BACK & BODY) 500-32.5 MG TABS Take 1 tablet by mouth daily as needed (pain).   clopidogrel (PLAVIX) 75 MG tablet TAKE 1 TABLET EVERY DAY   dorzolamide-timolol (COSOPT) 22.3-6.8 MG/ML ophthalmic solution Place 1 drop into both eyes 2 (two) times daily.    latanoprost (XALATAN) 0.005 % ophthalmic solution Place 1 drop into both eyes at bedtime.   Menthol-Methyl Salicylate (MUSCLE RUB) 10-15 % CREA Apply 1 Application topically as needed for muscle pain.   nitroGLYCERIN (NITROSTAT) 0.4 MG SL tablet PLACE 1 TABLET UNDER THE TONGUE AS NEEDED FOR CHEST PAIN   OVER THE COUNTER MEDICATION Take 2 tablets by mouth daily with lunch. Prostate Strong   Polyvinyl Alcohol-Povidone (REFRESH OP) Place 1 drop into both eyes daily as needed (dry eyes).   ranolazine (RANEXA) 500 MG 12 hr tablet Take 1 tablet (500 mg total) by mouth 2 (two) times daily.   Study - ORION 4 - inclisiran 300 mg/1.522mor placebo SQ injection (PI-Stuckey) Inject 1.5 mLs (300 mg total) into the skin every 6 (six) months.   No facility-administered encounter medications on file as of 10/13/2022.    Allergies (verified) Zetia [ezetimibe], Crestor [rosuvastatin], Isosorbide, Pravachol [pravastatin sodium], and Silver   History: Past Medical History:  Diagnosis Date   Allergy  Arthritis    CAP (community acquired pneumonia)    Cataract    bilateral   Coronary artery disease    03/13/2016 DES to first OM, EF 55% // Myoview 12/19: EF 55, ant-lat infarction, no significant ischemia, Low Risk   Heart murmur    "outgrew it" (03/13/2016)   Hematuria    HTN (hypertension)    Hx of cardiovascular stress test    ETT-Myoview (10/15): Nondiagnostic EKG changes, anterolateral and inferolateral scar, no ischemia, EF 48%, low risk   Hyperlipemia    Hyperlipidemia    NMR Lipoprofile 2008: LDL 168 ( 2410/ 1826), HDL 41, TG 71. LDL goal =  <100, ideally <  70. Framingham Study LDL goal = < 130.   Kidney stones    "passed them"; Dr. Terance Hart (03/13/2016)   Myocardial infarction [facility]) 2001   Non Hodgkin's [facility]   Pleural effusion    TIA (transient ischemic attack) 1980s   "I was exercising when I had it"   Past Surgical History:  Procedure Laterality Date   CARDIAC CATHETERIZATION N/A 12/22/2015   Procedure: Left Heart Cath and Coronary Angiography;  Surgeon: Sherren Mocha, MD;  Location: [place] [facility];  Service: Cardiovascular;  Laterality: N/A;   CARDIAC CATHETERIZATION N/A 12/22/2015   Procedure: Coronary Stent Intervention;  Surgeon: Sherren Mocha, MD;  Location: [place] [facility];  Service: Cardiovascular;  Laterality: N/A;   CARDIAC CATHETERIZATION N/A 12/22/2015   Procedure: Intravascular Pressure Wire/FFR Study;  Surgeon: Sherren Mocha, MD;  Location: [place] [facility];  Service: Cardiovascular;  Laterality: N/A;   CARDIAC CATHETERIZATION N/A 03/13/2016   Procedure: Left Heart Cath and Coronary Angiography;  Surgeon: Sherren Mocha, MD;  Location: [place] [facility];  Service: Cardiovascular;  Laterality: N/A;   COLONOSCOPY  2002   CORONARY ANGIOPLASTY WITH STENT PLACEMENT  ~ 2013   "1 stent"   CORONARY STENT INTERVENTION N/A 03/30/2022   Procedure: CORONARY STENT INTERVENTION;  Surgeon: Sherren Mocha, MD;  Location: [place] [facility];  Service: Cardiovascular;  Laterality: N/A;   ESOPHAGOGASTRODUODENOSCOPY (EGD) WITH ESOPHAGEAL DILATION  2002   KNEE ARTHROSCOPY Left 1990s   LEFT HEART CATH AND CORONARY ANGIOGRAPHY N/A 03/30/2022   Procedure: LEFT HEART CATH AND CORONARY ANGIOGRAPHY;  Surgeon: Sherren Mocha, MD;  Location: [place] [facility];  Service: Cardiovascular;  Laterality: N/A;   LEFT HEART CATH AND CORONARY ANGIOGRAPHY N/A 06/15/2022   Procedure: LEFT HEART CATH AND CORONARY ANGIOGRAPHY;  Surgeon: Sherren Mocha, MD;  Location: [place] [facility];  Service:  Cardiovascular;  Laterality: N/A;   Family History  Problem Relation Age of Onset   Cancer Mother        lung cancer   Diabetes Mother    Hyperlipidemia Mother    Stroke Father        age 63   Prostate cancer Father    Cancer Sister        ?lymphoma   Social History   Socioeconomic History   Marital status: Married    Spouse name: Not on file   Number of children: 3   Years of education: Not on file   Highest education level: Not on file  Occupational History   Occupation: retired Higher education careers adviser  Tobacco Use   Smoking status: Never   Smokeless tobacco: Never  Vaping Use   Vaping Use: Never used  Substance and Sexual Activity   Alcohol use: No    Comment: drank some in my  20's"   Drug use: No   Sexual activity: Yes  Other Topics Concern   Not on file  Social History Narrative   Not on file   Social Determinants of Health   Financial Resource Strain: Low Risk  (10/13/2022)   Overall Financial Resource Strain (CARDIA)    Difficulty of Paying Living Expenses: Not hard at all  Food Insecurity: No Food Insecurity (10/13/2022)   Hunger Vital Sign    Worried About Running Out of Food in the Last Year: Never true    Ran Out of Food in the Last Year: Never true  Transportation Needs: No Transportation Needs (10/13/2022)   PRAPARE - Hydrologist (Medical): No    Lack of Transportation (Non-Medical): No  Physical Activity: Insufficiently Active (10/13/2022)   Exercise Vital Sign    Days of Exercise per Week: 2 days    Minutes of Exercise per Session: 30 min  Stress: No Stress Concern Present (10/13/2022)   [place]    Feeling of Stress : Not at all  Social Connections: Moderately Integrated (10/13/2022)   Social Connection and Isolation Panel [NHANES]    Frequency of Communication with Friends and Family: More than three times a week    Frequency of Social Gatherings with Friends and  Family: More than three times a week    Attends Religious Services: More than 4 times per year    Active Member of [employer] or Organizations: No    Attends Music therapist: Never    Marital Status: Married    Tobacco Counseling Counseling given: Not Answered   Clinical Intake:  Pre-visit preparation completed: Yes  Pain : No/denies pain     Nutritional Risks: None Diabetes: No  How often do you need to have someone help you when you read instructions, pamphlets, or other written materials from your doctor or pharmacy?: 1 - Never  Diabetic?no   Interpreter Needed?: No  Information entered by :: Jadene Pierini, LPN   Activities of Daily Living    10/13/2022    8:55 AM  In your present state of health, do you have any difficulty performing the following activities:  Hearing? 0  Vision? 0  Difficulty concentrating or making decisions? 0  Walking or climbing stairs? 0  Dressing or bathing? 0  Doing errands, shopping? 0  Preparing Food and eating ? N  Using the Toilet? N  In the past six months, have you accidently leaked urine? N  Do you have problems with loss of bowel control? N  Managing your Medications? N  Managing your Finances? N  Housekeeping or managing your Housekeeping? N    Patient Care Team: Binnie Rail, MD as PCP - General (Internal Medicine) Sherren Mocha, MD as PCP - Cardiology (Cardiology) Hortencia Pilar, MD as Consulting Physician (Ophthalmology) Sharmon Revere as Physician Assistant (Cardiology)  Indicate any recent Medical Services you may have received from other than [facility] providers in the past year (date may be approximate).     Assessment:   This is a routine wellness examination for Austin [name].  Hearing/Vision screen Hearing Screening - Comments:: Wears rx glasses - up to date with routine eye exams with  Dr.Lynn  Dietary issues and exercise activities discussed: Current Exercise Habits: Home exercise  routine, Type of exercise: walking, Time (Minutes): 30, Frequency (Times/Week): 3, Weekly Exercise (Minutes/Week): 90, Intensity: Mild, Exercise limited by: None identified   Goals Addressed  This Visit's Progress     Patient Stated (pt-stated)   On track     To maintain my current health status by continuing to eat healthy, stay physically active and socially active.       Depression Screen    10/13/2022    8:54 AM 10/03/2022    2:06 PM 09/05/2022    8:21 AM 02/03/2022    8:41 AM 10/10/2021    1:02 PM 09/17/2020    2:23 PM 04/16/2020   10:34 AM  PHQ 2/9 Scores  PHQ - 2 Score 0 0 0 0 0 0 0  PHQ- 9 Score 0  0 0       Fall Risk    10/13/2022    8:53 AM 09/05/2022    8:21 AM 02/03/2022    8:42 AM 10/10/2021   12:52 PM 09/17/2020    2:25 PM  Fall Risk   Falls in the past year? 0 0 0 0 0  Number falls in past yr: 0 0 0 0 0  Injury with Fall? 0 0 0 0 0  Risk for fall due to : No Fall Risks No Fall Risks No Fall Risks  No Fall Risks  Follow up Falls prevention discussed Falls evaluation completed Falls evaluation completed Falls evaluation completed;Education provided;Falls prevention discussed     FALL RISK PREVENTION PERTAINING TO THE HOME:  Any stairs in or around the home? Yes  If so, are there any without handrails? No  Home free of loose throw rugs in walkways, pet beds, electrical cords, etc? Yes  Adequate lighting in your home to reduce risk of falls? Yes   ASSISTIVE DEVICES UTILIZED TO PREVENT FALLS:  Life alert? No  Use of a cane, walker or w/c? No  Grab bars in the bathroom? No  Shower chair or bench in shower? No  Elevated toilet seat or a handicapped toilet? No          10/13/2022    8:56 AM  6CIT Screen  What Year? 0 points  What month? 0 points  What time? 0 points  Count back from 20 0 points  Months in reverse 0 points  Repeat phrase 0 points  Total Score 0 points    Immunizations Immunization History  Administered Date(s)  Administered   Fluad Quad(high Dose 65+) 06/07/2019, 06/16/2020, 06/21/2021, 06/30/2022   Influenza, High Dose Seasonal PF 06/24/2018   Influenza-Unspecified 08/23/2017, 07/04/2018   PFIZER(Purple Top)SARS-COV-2 Vaccination 10/02/2019, 10/23/2019, 07/31/2020   Pneumococcal Conjugate-13 12/05/2017   Pneumococcal Polysaccharide-23 08/28/2010   Td 05/16/2006   Td (Adult), 2 Lf Tetanus Toxid, Preservative Free 05/16/2006   Zoster Recombinat (Shingrix) 07/01/2019, 11/15/2019    TDAP status: Due, Education has been provided regarding the importance of this vaccine. Advised may receive this vaccine at local pharmacy or Health Dept. Aware to provide a copy of the vaccination record if obtained from local pharmacy or Health Dept. Verbalized acceptance and understanding.  Flu Vaccine status: Up to date  Pneumococcal vaccine status: Up to date  Covid-19 vaccine status: Completed vaccines  Qualifies for Shingles Vaccine? Yes   Zostavax completed Yes   Shingrix Completed?: Yes  Screening Tests Health Maintenance  Topic Date Due   DTaP/Tdap/Td (2 - Tdap) 05/16/2016   COVID-19 Vaccine (4 - 2023-24 season) 04/28/2022   Medicare Annual Wellness (AWV)  10/14/2023   Pneumonia Vaccine 61+ Years old  Completed   INFLUENZA VACCINE  Completed   Hepatitis C Screening  Completed   Zoster Vaccines- Shingrix  Completed   HPV VACCINES  Aged Out   COLONOSCOPY (Pts 45-81yr Insurance coverage will need to be confirmed)  Discontinued    Health Maintenance  Health Maintenance Due  Topic Date Due   DTaP/Tdap/Td (2 - Tdap) 05/16/2016   COVID-19 Vaccine (4 - 2023-24 season) 04/28/2022    Colorectal cancer screening: No longer required.   Lung Cancer Screening: (Low Dose CT Chest recommended if Age 78-80years, 30 pack-year currently smoking OR have quit w/in 15years.) does not qualify.   Lung Cancer Screening Referral: n/a  Additional Screening:  Hepatitis C Screening: does not qualify; Completed  06/14/2017  Vision Screening: Recommended annual ophthalmology exams for early detection of glaucoma and other disorders of the eye. Is the patient up to date with their annual eye exam?  Yes  Who is the provider or what is the name of the office in which the patient attends annual eye exams? Dr.Lynn  If pt is not established with a provider, would they like to be referred to a provider to establish care? No .   Dental Screening: Recommended annual dental exams for proper oral hygiene  Community Resource Referral / Chronic Care Management: CRR required this visit?  No   CCM required this visit?  No      Plan:     I have personally reviewed and noted the following in the patient's chart:   Medical and social history Use of alcohol, tobacco or illicit drugs  Current medications and supplements including opioid prescriptions. Patient is not currently taking opioid prescriptions. Functional ability and status Nutritional status Physical activity Advanced directives List of other physicians Hospitalizations, surgeries, and ER visits in previous 12 months Vitals Screenings to include cognitive, depression, and falls Referrals and appointments  In addition, I have reviewed and discussed with patient certain preventive protocols, quality metrics, and best practice recommendations. A written personalized care plan for preventive services as well as general preventive health recommendations were provided to patient.     LDaphane Shepherd LPN   [id number]  Nurse Notes: Due TDAP Vaccine

## 2022-11-06 ENCOUNTER — Encounter: Payer: Medicare HMO | Admitting: *Deleted

## 2022-11-06 DIAGNOSIS — Z006 Encounter for examination for normal comparison and control in clinical research program: Secondary | ICD-10-CM

## 2022-11-06 MED ORDER — STUDY - ORION 4 - INCLISIRAN 300 MG/1.5 ML OR PLACEBO SQ INJECTION (PI-STUCKEY)
300.0000 mg | INJECTION | SUBCUTANEOUS | Status: DC
  Administered 2022-11-06: 300 mg via SUBCUTANEOUS
  Filled 2022-11-06: qty 1.5

## 2022-11-06 NOTE — Research (Signed)
Patient here today for [facility] 4  Patient doing well, no complaints.  No med changes  Labs drawn IP given LUQ '@1122'$   Next appt in [facility]  Sept 9th @ 10

## 2022-11-13 ENCOUNTER — Telehealth: Payer: Self-pay | Admitting: Internal Medicine

## 2022-11-13 ENCOUNTER — Ambulatory Visit: Payer: Medicare HMO | Admitting: Cardiovascular Disease

## 2022-11-13 ENCOUNTER — Encounter: Payer: Self-pay | Admitting: Internal Medicine

## 2022-11-13 DIAGNOSIS — Z8673 Personal history of transient ischemic attack (TIA), and cerebral infarction without residual deficits: Secondary | ICD-10-CM | POA: Insufficient documentation

## 2022-11-13 NOTE — Telephone Encounter (Signed)
We have received Surgical Clearance PW in the fax, to be filled out by provider. Patient requested to send it via Fax within 7-days. Document is located in providers tray at front office.Please advise at Mobile [phone] (mobile)   Please fax [phone]

## 2022-11-13 NOTE — Telephone Encounter (Signed)
Placed in Dr. Burns blue folder 

## 2022-11-14 NOTE — Telephone Encounter (Signed)
Called and spoke with patient.  He will need to be cleared by Dr. Burt Knack and patient has been informed.  Called and left message for [facility] as well that Dr. Burt Knack would need to provide clearance for blood thinner.

## 2022-12-29 DIAGNOSIS — H401111 Primary open-angle glaucoma, right eye, mild stage: Secondary | ICD-10-CM | POA: Diagnosis not present

## 2022-12-29 DIAGNOSIS — H401122 Primary open-angle glaucoma, left eye, moderate stage: Secondary | ICD-10-CM | POA: Diagnosis not present

## 2022-12-29 DIAGNOSIS — H40033 Anatomical narrow angle, bilateral: Secondary | ICD-10-CM | POA: Diagnosis not present

## 2022-12-29 DIAGNOSIS — H25813 Combined forms of age-related cataract, bilateral: Secondary | ICD-10-CM | POA: Diagnosis not present

## 2023-01-05 DIAGNOSIS — Z0100 Encounter for examination of eyes and vision without abnormal findings: Secondary | ICD-10-CM | POA: Diagnosis not present

## 2023-01-29 ENCOUNTER — Ambulatory Visit: Payer: Medicare HMO | Attending: Cardiovascular Disease | Admitting: Cardiovascular Disease

## 2023-01-29 ENCOUNTER — Encounter: Payer: Self-pay | Admitting: Cardiovascular Disease

## 2023-01-29 VITALS — BP 112/80 | HR 71 | Ht 73.0 in | Wt 198.6 lb

## 2023-01-29 DIAGNOSIS — I10 Essential (primary) hypertension: Secondary | ICD-10-CM | POA: Diagnosis not present

## 2023-01-29 DIAGNOSIS — I25119 Atherosclerotic heart disease of native coronary artery with unspecified angina pectoris: Secondary | ICD-10-CM

## 2023-01-29 DIAGNOSIS — E782 Mixed hyperlipidemia: Secondary | ICD-10-CM

## 2023-01-29 DIAGNOSIS — I700 Atherosclerosis of aorta: Secondary | ICD-10-CM | POA: Diagnosis not present

## 2023-01-29 NOTE — Progress Notes (Signed)
Cardiology Office Note:    Date:  01/29/2023   ID:  Austin [name], DOB 1946/08/18, MRN [id number]  PCP:  Austin Sanes, MD   [facility] Providers Cardiologist:  Tonny Bollman, MD Cardiology APP:  Kennon Rounds     Referring MD: Austin Sanes, MD   Chief Complaint  Patient presents with   Coronary Artery Disease    History of Present Illness:    Austin [name] is a 77 y.o. male presenting for follow-up of CAD. His cardiovascular problem list includes:  Coronary artery disease S/p Lat MI in 2012 tx with BMS to LCx PCI 4/17: DES to prox LAD PCI 7/17: DES to OM1 Myoview 12/19: ant-lat infarct, no ischemia PCI 03/2022: s/p 3 x 16 mm DES to OM1 Cath 06/15/22: LAD stent patent; apical LAD 80-90 (med Rx), small D1, D2 90 (med Rx); OM1 stent patent; mLCx 100 (CTO); pRCA 50, dRCA 50, oRPDA 80; multiple PDA/PLA branches w severe dz - Med Rx Follicular lymphoma, Gr I-II (Dr. Benita Gutter, [facility]) Hypertension Hyperlipidemia ORION 4 Trial participant Hx of TIA Aortic atherosclerosis  The patient is here alone today. He is doing well. Reports no chest pain but occasionally gets 'soreness' in the chest. No dyspnea, palpitations, edema, orthopnea, or PND. No change in any symptoms over the past 6 months.  The patient's son is getting married in [place] in the near future.  Past Medical History:  Diagnosis Date   Allergy    Arthritis    CAP (community acquired pneumonia)    Cataract    bilateral   Coronary artery disease    03/13/2016 DES to first OM, EF 55% // Myoview 12/19: EF 55, ant-lat infarction, no significant ischemia, Low Risk   Heart murmur    "outgrew it" (03/13/2016)   Hematuria    HTN (hypertension)    Hx of cardiovascular stress test    ETT-Myoview (10/15): Nondiagnostic EKG changes, anterolateral and inferolateral scar, no ischemia, EF 48%, low risk   Hyperlipemia    Hyperlipidemia    NMR Lipoprofile 2008: LDL 168 ( 2410/ 1826), HDL 41,  TG 71. LDL goal = <100, ideally <  70. Framingham Study LDL goal = < 130.   Kidney stones    "passed them"; Dr. Vonita Moss (03/13/2016)   Myocardial infarction [facility]) 2001   Non Hodgkin's lymphoma (HCC)    [facility]   Pleural effusion    TIA (transient ischemic attack) 1980s   "I was exercising when I had it"    Past Surgical History:  Procedure Laterality Date   CARDIAC CATHETERIZATION N/A 12/22/2015   Procedure: Left Heart Cath and Coronary Angiography;  Surgeon: Tonny Bollman, MD;  Location: [facility] INVASIVE CV LAB;  Service: Cardiovascular;  Laterality: N/A;   CARDIAC CATHETERIZATION N/A 12/22/2015   Procedure: Coronary Stent Intervention;  Surgeon: Tonny Bollman, MD;  [facility];  Service: Cardiovascular;  Laterality: N/A;   CARDIAC CATHETERIZATION N/A 12/22/2015   Procedure: Intravascular Pressure Wire/FFR Study;  Surgeon: Tonny Bollman, MD;  Location[facility] INVASIVE CV LAB;  Service: Cardiovascular;  Laterality: N/A;   CARDIAC CATHETERIZATION N/A 03/13/2016   Procedure: Left Heart Cath and Coronary Angiography;  Surgeon: Tonny Bollman, MD;  Location: [facility] INVASIVE CV LAB;  Service: Cardiovascular;  Laterality: N/A;   COLONOSCOPY  2002   CORONARY ANGIOPLASTY WITH STENT PLACEMENT  ~ 2013   "1 stent"   CORONARY STENT INTERVENTION N/A 03/30/2022   Procedure: CORONARY STENT INTERVENTION;  Surgeon: Tonny Bollman, MD;  Location: [facility] INVASIVE CV LAB;  Service: Cardiovascular;  Laterality: N/A;   ESOPHAGOGASTRODUODENOSCOPY (EGD) WITH ESOPHAGEAL DILATION  2002   KNEE ARTHROSCOPY Left 1990s   LEFT HEART CATH AND CORONARY ANGIOGRAPHY N/A 03/30/2022   Procedure: LEFT HEART CATH AND CORONARY ANGIOGRAPHY;  Surgeon: Tonny Bollman, MD;  Location: [facility] INVASIVE CV LAB;  Service: Cardiovascular;  Laterality: N/A;   LEFT HEART CATH AND CORONARY ANGIOGRAPHY N/A 06/15/2022   Procedure: LEFT HEART CATH AND CORONARY ANGIOGRAPHY;  Surgeon: Tonny Bollman, MD;  Location: [facility] INVASIVE CV LAB;   Service: Cardiovascular;  Laterality: N/A;    Current Medications: Current Meds  Medication Sig   Alcohol Swabs (B-D SINGLE USE SWABS REGULAR) PADS 1 each by Does not apply route as needed.   amLODipine (NORVASC) 10 MG tablet TAKE 1 TABLET EVERY DAY (PLEASE KEEP UPCOMING APPOINTMENT FOR FUTURE REFILLS)   Ascorbic Acid (VITAMIN C PO) Take 1 tablet by mouth 2 (two) times a week.   aspirin 81 MG tablet Take 1 tablet (81 mg total) by mouth daily.   Aspirin-Caffeine (BAYER BACK & BODY) 500-32.5 MG TABS Take 1 tablet by mouth daily as needed (pain).   clopidogrel (PLAVIX) 75 MG tablet TAKE 1 TABLET EVERY DAY   dorzolamide-timolol (COSOPT) 22.3-6.8 MG/ML ophthalmic solution Place 1 drop into both eyes 2 (two) times daily.    latanoprost (XALATAN) 0.005 % ophthalmic solution Place 1 drop into both eyes at bedtime.   Menthol-Methyl Salicylate (MUSCLE RUB) 10-15 % CREA Apply 1 Application topically as needed for muscle pain.   nitroGLYCERIN (NITROSTAT) 0.4 MG SL tablet PLACE 1 TABLET UNDER THE TONGUE AS NEEDED FOR CHEST PAIN   OVER THE COUNTER MEDICATION Take 2 tablets by mouth daily with lunch. Prostate Strong   Polyvinyl Alcohol-Povidone (REFRESH OP) Place 1 drop into both eyes daily as needed (dry eyes).   ranolazine (RANEXA) 500 MG 12 hr tablet Take 1 tablet (500 mg total) by mouth 2 (two) times daily.   Study - ORION 4 - inclisiran 300 mg/1.34mL or placebo SQ injection (PI-Stuckey) Inject 1.5 mLs (300 mg total) into the skin every 6 (six) months.   Current Facility-Administered Medications for the 01/29/23 encounter (Office Visit) with Tonny Bollman, MD  Medication   Study - ORION 4 - inclisiran 300 mg/1.39mL or placebo SQ injection (PI-Stuckey)     Allergies:   Zetia [ezetimibe], Crestor [rosuvastatin], Isosorbide, Pravachol [pravastatin sodium], and Silver   Social History   Socioeconomic History   Marital status: Married    Spouse name: Not on file   Number of children: 3   Years of  education: Not on file   Highest education level: Not on file  Occupational History   Occupation: retired Nurse, adult  Tobacco Use   Smoking status: Never   Smokeless tobacco: Never  Vaping Use   Vaping Use: Never used  Substance and Sexual Activity   Alcohol use: No    Comment: drank some in my 20's"   Drug use: No   Sexual activity: Yes  Other Topics Concern   Not on file  Social History Narrative   Not on file   Social Determinants of Health   Financial Resource Strain: Low Risk  (10/13/2022)   Overall Financial Resource Strain (CARDIA)    Difficulty of Paying Living Expenses: Not hard at all  Food Insecurity: No Food Insecurity (10/13/2022)   Hunger Vital Sign    Worried About Running Out of Food in the Last Year: Never true  Ran Out of Food in the Last Year: Never true  Transportation Needs: No Transportation Needs (10/13/2022)   PRAPARE - Administrator, Civil Service (Medical): No    Lack of Transportation (Non-Medical): No  Physical Activity: Insufficiently Active (10/13/2022)   Exercise Vital Sign    Days of Exercise per Week: 2 days    Minutes of Exercise per Session: 30 min  Stress: No Stress Concern Present (10/13/2022)   [employer] of Occupational Health - Occupational Stress Questionnaire    Feeling of Stress : Not at all  Social Connections: Moderately Integrated (10/13/2022)   Social Connection and Isolation Panel [NHANES]    Frequency of Communication with Friends and Family: More than three times a week    Frequency of Social Gatherings with Friends and Family: More than three times a week    Attends Religious Services: More than 4 times per year    Active Member of [employer] or Organizations: No    Attends Engineer, structural: Never    Marital Status: Married     Family History: The patient's family history includes Cancer in his mother and sister; Diabetes in his mother; Hyperlipidemia in his mother; Prostate cancer in his  father; Stroke in his father.  ROS:   Please see the history of present illness.    All other systems reviewed and are negative.  EKGs/Labs/Other Studies Reviewed:    The following studies were reviewed today: Cardiac Studies & Procedures   CARDIAC CATHETERIZATION  CARDIAC CATHETERIZATION 06/15/2022  Narrative Unchanged coronary anatomy from recent cardiac cath procedure with small vessel disease involving the diagonal branches of the LAD and the terminal branches of the RCA.  There is severe disease in those vessels, all of which are 1 mm or less in caliber.  The LAD is patent with mild to moderate stenosis but no focal severe lesions.  The recently implanted circumflex stent is patent.  The RCA is patent with mild nonobstructive disease.  Recommend continue current medical therapy.  Can increase antianginals if needed.  Findings Coronary Findings Diagnostic  Dominance: Right  Left Anterior Descending The proximal LAD stented segment remains widely patent.  There is mild diffuse disease to the mid vessel.  The apical LAD has stable 80 to 90% stenosis unchanged from the prior study.  This is all unchanged from the recent cath study.  There is mild to moderate mid vessel stenosis present. Non-stenotic Prox LAD lesion was previously treated. Dist LAD lesion is 90% stenosed. Small vessel, apical portion of LAD, unchanged from previous study  Lateral First Diagonal Branch Lat 1st Diag lesion is 90% stenosed. Small vessel unchanged from previous study  Second Diagonal Branch 2nd Diag lesion is 90% stenosed. Small vessel unchanged from previous study  Left Circumflex AV circumflex is occluded distally and this is essentially unchanged from the previous study. The first OM branch of the circumflex has new severe stenosis off of the distal edge of a previously implanted stent.  The recently implanted stent is widely patent with no restenosis.  The edges of the stent are patent with no  stenosis. Collaterals Dist Cx filled by collaterals from Inf Sept.  Mid Cx lesion is 100% stenosed. Chronic occlusion unchanged from previous study  First Obtuse Marginal Branch Non-stenotic 1st Mrg-1 lesion was previously treated. TIMI-2 flow, eccentric plaque, new from last study Previously placed 1st Mrg-2 stent of unknown type is  widely patent. Non-stenotic 1st Mrg-3 lesion was previously treated. Vessel is the culprit  lesion.  Third Obtuse Marginal Branch Collaterals 3rd Mrg filled by collaterals from Inf Sept.  Right Coronary Artery There is mild diffuse disease throughout the vessel. The RCA is diffusely diseased.  The proximal vessel has 40 to 50% stenosis in the distal vessel has 50% stenosis.  The vessel divides into multiple PDA and PLA branches with complex severe stenosis involving these bifurcations, not suitable for PCI.  The disease distribution in the RCA is unchanged from the recent cath study. Prox RCA lesion is 40% stenosed. Dist RCA lesion is 50% stenosed.  Right Posterior Descending Artery Ost RPDA to RPDA lesion is 50% stenosed. Unchanged from previous study RPDA lesion is 80% stenosed. The lesion is discrete. Small vessel, unchanged from previous study  Intervention  No interventions have been documented.   CARDIAC CATHETERIZATION  CARDIAC CATHETERIZATION 03/30/2022  Narrative   Dist LAD lesion is 90% stenosed.   Mid Cx lesion is 100% stenosed.   Lat 1st Diag lesion is 90% stenosed.   2nd Diag lesion is 90% stenosed.   Ost RPDA to RPDA lesion is 50% stenosed.   RPDA lesion is 80% stenosed.   1st Mrg-3 lesion is 95% stenosed.   Prox RCA lesion is 40% stenosed.   Dist RCA lesion is 50% stenosed.   Non-stenotic 1st Mrg-2 lesion was previously treated.   Non-stenotic Prox LAD lesion was previously treated.   Non-stenotic 1st Mrg-1 lesion was previously treated.   A drug-eluting stent was successfully placed using a SYNERGY XD 3.0X16.   Post  intervention, there is a 0% residual stenosis.  1.  Severe stenosis of the first OM branch of the circumflex (distal stent edge) treated successfully with PCI using a 3.0 x 16 mm Synergy DES 2.  Continued patency of the proximal LAD stent with no evidence of significant restenosis and stable apical LAD stenosis unchanged from the previous study 3.  Patent left main with no significant stenosis 4.  Patent but diffusely diseased RCA with severe stenosis involving multiple bifurcation areas in the distal branch vessels not suitable for PCI  Recommendations: Same-day PCI protocol if criteria met, dual antiplatelet therapy with aspirin and clopidogrel minimum of 12 months favor long-term therapy in this patient with multiple PCI procedures  Findings Coronary Findings Diagnostic  Dominance: Right  Left Anterior Descending The proximal LAD stented segment remains widely patent.  There is mild diffuse disease to the mid vessel.  The apical LAD has stable 80 to 90% stenosis unchanged from the prior study Non-stenotic Prox LAD lesion was previously treated. Dist LAD lesion is 90% stenosed. Small vessel, apical portion of LAD, unchanged from previous study  Lateral First Diagonal Branch Lat 1st Diag lesion is 90% stenosed. Small vessel unchanged from previous study  Second Diagonal Branch 2nd Diag lesion is 90% stenosed. Small vessel unchanged from previous study  Left Circumflex AV circumflex is occluded distally and this is essentially unchanged from the previous study. The first OM branch of the circumflex has new severe stenosis off of the distal edge of a previously implanted stent Collaterals Dist Cx filled by collaterals from Inf Sept.  Mid Cx lesion is 100% stenosed. Chronic occlusion unchanged from previous study  First Obtuse Marginal Branch Non-stenotic 1st Mrg-1 lesion was previously treated. TIMI-2 flow, eccentric plaque, new from last study Non-stenotic 1st Mrg-2 lesion was  previously treated. 1st Mrg-3 lesion is 95% stenosed. Vessel is the culprit lesion.  Third Obtuse Marginal Branch Collaterals 3rd Mrg filled by collaterals from Inf Sept.  Right Coronary Artery There is mild diffuse disease throughout the vessel. The RCA is diffusely diseased.  The proximal vessel has 40 to 50% stenosis in the distal vessel has 50% stenosis.  The vessel divides into multiple PDA and PLA branches with complex severe stenosis involving these bifurcations, not really suitable for PCI. Prox RCA lesion is 40% stenosed. Dist RCA lesion is 50% stenosed.  Right Posterior Descending Artery Ost RPDA to RPDA lesion is 50% stenosed. Unchanged from previous study RPDA lesion is 80% stenosed. The lesion is discrete. Small vessel, unchanged from previous study  Intervention  1st Mrg-3 lesion Stent CATH LAUNCHER 6FR EBU3.5 guide catheter was inserted. Lesion crossed with guidewire using a WIRE COUGAR XT STRL 190CM. Pre-stent angioplasty was performed using a BALLN SAPPHIRE 2.5X12. A drug-eluting stent was successfully placed using a SYNERGY XD 3.0X16. Post-stent angioplasty was performed using a BALL SAPPHIRE NC24 3.25X12. Post-Intervention Lesion Assessment The intervention was successful. Pre-interventional TIMI flow is 3. Post-intervention TIMI flow is 3. No complications occurred at this lesion. There is a 0% residual stenosis post intervention.   STRESS TESTS  MYOCARDIAL PERFUSION IMAGING 08/12/2018  Narrative  Nuclear stress EF: 55%.  Blood pressure demonstrated a normal response to exercise.  Horizontal ST segment depression ST segment depression of 1 mm was noted during stress in the V4 and V5 leads, and returning to baseline after less than 1 minute of recovery.  This is a low risk study.  The left ventricular ejection fraction is normal (55-65%).  1. EF 55%, normal wall motion. 2. Primarily fixed medium-sized, moderate intensity basal to mid anterolateral  perfusion defect with some reversibility at the base.  This appears to be primarily anterolateral infarction though there surprisingly does not appear to be a significant wall motion abnormality. 3. Borderline significant near 1 mm ST depression V4 and V5 at peak exercise, resolving quickly in recovery.  Overall, I think this is a low risk study.  Most consistent with anterolateral infarction.   ECHOCARDIOGRAM  ECHOCARDIOGRAM COMPLETE 08/30/2016  Narrative *[place]* *[facility]* [address] [place], [place] [address] [phone]  ------------------------------------------------------------------- Transthoracic Echocardiography  Patient:    Tally, Ehlke MR #:       [id number] Study Date: 08/30/2016 Gender:     M Age:        60 Height:     185.4 cm Weight:     88.7 kg BSA:        2.15 m^2 Pt. Status: Room:       5W08C  ADMITTING    Ozella Rocks ATTENDING    Rosey Bath REFERRING    Joseph Art PERFORMING   [facility], Inpatient SONOGRAPHER  Thurman Coyer  cc:  ------------------------------------------------------------------- LV EF: 55% -   60%  ------------------------------------------------------------------- Indications:      Dyspnea 786.09.  ------------------------------------------------------------------- History:   PMH:   Murmur.  Coronary artery disease.  PMH: Myocardial infarction.  Transient ischemic attack.  Risk factors: Hypertension. Dyslipidemia.  ------------------------------------------------------------------- Study Conclusions  - Left ventricle: The cavity size was normal. Wall thickness was normal. Systolic function was normal. The estimated ejection fraction was in the range of 55% to 60%. Although no diagnostic regional wall motion abnormality was identified, this possibility cannot be completely excluded on the basis of this study. Doppler parameters are consistent with  abnormal left ventricular relaxation (grade 1 diastolic dysfunction). - Aortic valve: There was no stenosis. There was trivial regurgitation. -  Aorta: Dilated aortic root. Aortic root dimension: 42 mm (ED). - Mitral valve: There was no significant regurgitation. - Right ventricle: The cavity size was normal. Systolic function was normal. - Pericardium, extracardiac: A trivial pericardial effusion was identified.  Impressions:  - Normal LV size and systolic function, EF 55-60%. Normal RV size and systolic function. No significant valvular abnormalities.  ------------------------------------------------------------------- Study data:  No prior study was available for comparison.  Study status:  Routine.  Procedure:  The patient reported no pain pre or post test. Transthoracic echocardiography. Image quality was adequate.  Study completion:  There were no complications. Transthoracic echocardiography.  M-mode, complete 2D, spectral Doppler, and color Doppler.  Birthdate:  Patient birthdate: [birth year].  Age:  Patient is 77 yr old.  Sex:  Gender: male. BMI: 25.8 kg/m^2.  Blood pressure:     126/68  Patient status: Inpatient.  Study date:  Study date: 08/30/2016. Study time: 11:07 AM.  Location:  Echo laboratory.  -------------------------------------------------------------------  ------------------------------------------------------------------- Left ventricle:  The cavity size was normal. Wall thickness was normal. Systolic function was normal. The estimated ejection fraction was in the range of 55% to 60%. Although no diagnostic regional wall motion abnormality was identified, this possibility cannot be completely excluded on the basis of this study. Doppler parameters are consistent with abnormal left ventricular relaxation (grade 1 diastolic dysfunction).  ------------------------------------------------------------------- Aortic valve:   Trileaflet.  Doppler:   There was no  stenosis. There was trivial regurgitation.  ------------------------------------------------------------------- Aorta:  Dilated aortic root.  ------------------------------------------------------------------- Mitral valve:   Normal thickness leaflets .  Doppler:   There was no evidence for stenosis.   There was no significant regurgitation.  ------------------------------------------------------------------- Left atrium:  The atrium was normal in size.  ------------------------------------------------------------------- Right ventricle:  The cavity size was normal. Systolic function was normal.  ------------------------------------------------------------------- Pulmonic valve:    Structurally normal valve.   Cusp separation was normal.  Doppler:  Transvalvular velocity was within the normal range. There was trivial regurgitation.  ------------------------------------------------------------------- Tricuspid valve:   Doppler:  There was trivial regurgitation.  ------------------------------------------------------------------- Right atrium:  The atrium was normal in size.  ------------------------------------------------------------------- Pericardium:  A trivial pericardial effusion was identified.  ------------------------------------------------------------------- Post procedure conclusions Ascending Aorta:  - Dilated aortic root.  ------------------------------------------------------------------- Measurements  Left ventricle                         Value        Reference LV ID, ED, PLAX chordal        (H)     52.3  mm     43 - 52 LV ID, ES, PLAX chordal                36.3  mm     23 - 38 LV fx shortening, PLAX chordal         31    %      >=29 LV PW thickness, ED                    11    mm     ---------- IVS/LV PW ratio, ED                    0.86         <=1.3 LV e&', lateral                         6.31  cm/s   ---------- LV e&', medial                           4.79  cm/s   ---------- LV e&', average                         5.55  cm/s   ----------  Ventricular septum                     Value        Reference IVS thickness, ED                      9.47  mm     ----------  LVOT                                   Value        Reference LVOT ID, S                             22    mm     ---------- LVOT area                              3.8   cm^2   ----------  Aorta                                  Value        Reference Aortic root ID, ED                     42    mm     ----------  Left atrium                            Value        Reference LA ID, A-P, ES                         35    mm     ---------- LA ID/bsa, A-P                         1.63  cm/m^2 <=2.2 LA volume, S                           51.6  ml     ---------- LA volume/bsa, S                       24    ml/m^2 ---------- LA volume, ES, 1-p A4C                 42.9  ml     ---------- LA volume/bsa, ES, 1-p A4C             20    ml/m^2 ---------- LA volume, ES, 1-p A2C                 61.3  ml     ---------- LA volume/bsa, ES, 1-p A2C  28.6  ml/m^2 ----------  Tricuspid valve                        Value        Reference Tricuspid regurg peak velocity         171   cm/s   ---------- Tricuspid peak RV-RA gradient          12    mm Hg  ----------  Right atrium                           Value        Reference RA ID, S-I, ES, A4C            (H)     53.3  mm     34 - 49 RA area, ES, A4C                       13.4  cm^2   8.3 - 19.5 RA volume, ES, A/L                     23.6  ml     ---------- RA volume/bsa, ES, A/L                 11    ml/m^2 ----------  Right ventricle                        Value        Reference TAPSE                                  21.7  mm     ---------- RV s&', lateral, S                      14.8  cm/s   ----------  Legend: (L)  and  (H)  mark values outside specified reference  range.  ------------------------------------------------------------------- Prepared and Electronically Authenticated by  Marca Ancona, M.D. 2018-01-03T14:22:56              EKG:  EKG is not ordered today.    Recent Labs: 09/05/2022: ALT 20; BUN 14; Creatinine, Ser 1.33; Hemoglobin 13.0; Platelets 292.0; Potassium 4.6; Sodium 141; TSH 3.31  Recent Lipid Panel    Component Value Date/Time   CHOL 221 (H) 04/16/2020 1059   TRIG 108 04/16/2020 1059   HDL 49 04/16/2020 1059   CHOLHDL 4.5 04/16/2020 1059   VLDL 18.4 02/20/2018 1157   LDLCALC 150 (H) 04/16/2020 1059   LDLDIRECT 173.2 07/21/2013 0809     Risk Assessment/Calculations:                Physical Exam:    VS:  BP 112/80   Pulse 71   Ht 6\' 1"  (1.854 m)   Wt 198 lb 9.6 oz (90.1 kg)   SpO2 97%   BMI 26.20 kg/m     Wt Readings from Last 3 Encounters:  01/29/23 198 lb 9.6 oz (90.1 kg)  10/13/22 200 lb (90.7 kg)  10/03/22 200 lb (90.7 kg)     GEN:  Well nourished, well developed in no acute distress HEENT: Normal NECK: No JVD; No carotid bruits LYMPHATICS: No lymphadenopathy CARDIAC: RRR, 2/6 SEM at the LLSB, early peak RESPIRATORY:  Clear to auscultation  without rales, wheezing or rhonchi  ABDOMEN: Soft, non-tender, non-distended MUSCULOSKELETAL:  No edema; No deformity  SKIN: Warm and dry NEUROLOGIC:  Alert and oriented x 3 PSYCHIATRIC:  Normal affect   ASSESSMENT:    1. Coronary artery disease involving native coronary artery of native heart with angina pectoris (HCC)   2. Atherosclerosis of aorta (HCC)   3. Essential hypertension   4. Mixed hyperlipidemia    PLAN:    In order of problems listed above:  Stable symptoms of angina reported.  Continue aspirin and clopidogrel.  Continue ranolazine, amlodipine, and lipid-lowering via the Orion 4 protocol.  No changes are made today.  Follow-up in 1 year.  Reviewed cath studies of last year demonstrating small vessel CAD. Continue medical therapy as  outlined above. Blood pressure is well-controlled on amlodipine Treated via the Orion 4 protocol (inclisiran versus placebo)     Medication Adjustments/Labs and Tests Ordered: Current medicines are reviewed at length with the patient today.  Concerns regarding medicines are outlined above.  No orders of the defined types were placed in this encounter.  No orders of the defined types were placed in this encounter.   Patient Instructions  Medication Instructions:  Your physician recommends that you continue on your current medications as directed. Please refer to the Current Medication list given to you today.  *If you need a refill on your cardiac medications before your next appointment, please call your pharmacy*   Lab Work: NONE If you have labs (blood work) drawn today and your tests are completely normal, you will receive your results only by: MyChart Message (if you have MyChart) OR A paper copy in the mail If you have any lab test that is abnormal or we need to change your treatment, we will call you to review the results.   Testing/Procedures: NONE   Follow-Up: At [facility], you and your health needs are our priority.  As part of our continuing mission to provide you with exceptional heart care, we have created designated Provider Care Teams.  These Care Teams include your primary Cardiologist (physician) and Advanced Practice Providers (APPs -  Physician Assistants and Nurse Practitioners) who all work together to provide you with the care you need, when you need it.  We recommend signing up for the patient portal called "MyChart".  Sign up information is provided on this After Visit Summary.  MyChart is used to connect with patients for Virtual Visits (Telemedicine).  Patients are able to view lab/test results, encounter notes, upcoming appointments, etc.  Non-urgent messages can be sent to your provider as well.   To learn more about what you can do with  MyChart, go to [email or web address].    Your next appointment:   1 year(s)  Provider:   Tonny Bollman, MD        Signed, Tonny Bollman, MD  01/29/2023 4:13 PM    [facility]

## 2023-01-29 NOTE — Patient Instructions (Signed)
Medication Instructions:  Your physician recommends that you continue on your current medications as directed. Please refer to the Current Medication list given to you today.  *If you need a refill on your cardiac medications before your next appointment, please call your pharmacy*   Lab Work: NONE If you have labs (blood work) drawn today and your tests are completely normal, you will receive your results only by: MyChart Message (if you have MyChart) OR A paper copy in the mail If you have any lab test that is abnormal or we need to change your treatment, we will call you to review the results.   Testing/Procedures: NONE   Follow-Up: At [facility], you and your health needs are our priority.  As part of our continuing mission to provide you with exceptional heart care, we have created designated Provider Care Teams.  These Care Teams include your primary Cardiologist (physician) and Advanced Practice Providers (APPs -  Physician Assistants and Nurse Practitioners) who all work together to provide you with the care you need, when you need it.  We recommend signing up for the patient portal called "MyChart".  Sign up information is provided on this After Visit Summary.  MyChart is used to connect with patients for Virtual Visits (Telemedicine).  Patients are able to view lab/test results, encounter notes, upcoming appointments, etc.  Non-urgent messages can be sent to your provider as well.   To learn more about what you can do with MyChart, go to [email or web address].    Your next appointment:   1 year(s)  Provider:   Michael Cooper, MD      

## 2023-03-02 ENCOUNTER — Telehealth: Payer: Self-pay | Admitting: Internal Medicine

## 2023-03-02 NOTE — Telephone Encounter (Signed)
Message left for patient that it is okay to proceed with COVID vaccine based on current med hx.

## 2023-03-02 NOTE — Telephone Encounter (Signed)
Pt called to ask about his covid vaccine, states his mychart app is telling him he's over due.   Pt last had covid vaccine in 2021 and wants to know if he should get another shot this year.   Please call pt and advise:  [phone]

## 2023-03-19 DIAGNOSIS — R030 Elevated blood-pressure reading, without diagnosis of hypertension: Secondary | ICD-10-CM | POA: Diagnosis not present

## 2023-03-19 DIAGNOSIS — R0602 Shortness of breath: Secondary | ICD-10-CM | POA: Diagnosis not present

## 2023-03-19 DIAGNOSIS — J029 Acute pharyngitis, unspecified: Secondary | ICD-10-CM | POA: Diagnosis not present

## 2023-03-19 DIAGNOSIS — R0989 Other specified symptoms and signs involving the circulatory and respiratory systems: Secondary | ICD-10-CM | POA: Diagnosis not present

## 2023-03-19 DIAGNOSIS — J209 Acute bronchitis, unspecified: Secondary | ICD-10-CM | POA: Diagnosis not present

## 2023-03-19 DIAGNOSIS — Z20822 Contact with and (suspected) exposure to covid-19: Secondary | ICD-10-CM | POA: Diagnosis not present

## 2023-03-19 DIAGNOSIS — R059 Cough, unspecified: Secondary | ICD-10-CM | POA: Diagnosis not present

## 2023-04-02 ENCOUNTER — Encounter: Payer: Self-pay | Admitting: Internal Medicine

## 2023-04-02 NOTE — Progress Notes (Unsigned)
    Subjective:    Patient ID: Austin [name], male    DOB: 1945-09-01, 77 y.o.   MRN: [id number]      HPI Austin [name] is here for No chief complaint on file.    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     Last colonoscopy 2019 - polyps removed - advised repeat in 3 years ( 2022).    Medications and allergies reviewed with patient and updated if appropriate.  Current Outpatient Medications on File Prior to Visit  Medication Sig Dispense Refill   Alcohol Swabs (B-D SINGLE USE SWABS REGULAR) PADS 1 each by Does not apply route as needed. 3 each 3   amLODipine (NORVASC) 10 MG tablet TAKE 1 TABLET EVERY DAY (PLEASE KEEP UPCOMING APPOINTMENT FOR FUTURE REFILLS) 90 tablet 3   Ascorbic Acid (VITAMIN C PO) Take 1 tablet by mouth 2 (two) times a week.     aspirin 81 MG tablet Take 1 tablet (81 mg total) by mouth daily. 30 tablet    Aspirin-Caffeine (BAYER BACK & BODY) 500-32.5 MG TABS Take 1 tablet by mouth daily as needed (pain).     clopidogrel (PLAVIX) 75 MG tablet TAKE 1 TABLET EVERY DAY 90 tablet 3   dorzolamide-timolol (COSOPT) 22.3-6.8 MG/ML ophthalmic solution Place 1 drop into both eyes 2 (two) times daily.      latanoprost (XALATAN) 0.005 % ophthalmic solution Place 1 drop into both eyes at bedtime.     Menthol-Methyl Salicylate (MUSCLE RUB) 10-15 % CREA Apply 1 Application topically as needed for muscle pain.     nitroGLYCERIN (NITROSTAT) 0.4 MG SL tablet PLACE 1 TABLET UNDER THE TONGUE AS NEEDED FOR CHEST PAIN 25 tablet 9   OVER THE COUNTER MEDICATION Take 2 tablets by mouth daily with lunch. Prostate Strong     Polyvinyl Alcohol-Povidone (REFRESH OP) Place 1 drop into both eyes daily as needed (dry eyes).     ranolazine (RANEXA) 500 MG 12 hr tablet Take 1 tablet (500 mg total) by mouth 2 (two) times daily. 180 tablet 3   Study  - ORION 4 - inclisiran 300 mg/1.29mL or placebo SQ injection (PI-Stuckey) Inject 1.5 mLs (300 mg total) into the skin every 6 (six) months. 1 mL 0   Current Facility-Administered Medications on File Prior to Visit  Medication Dose Route Frequency Provider Last Rate Last Admin   Study - ORION 4 - inclisiran 300 mg/1.81mL or placebo SQ injection (PI-Stuckey)  300 mg Subcutaneous Q6 months Herby Abraham, MD   300 mg at 11/06/22 1122    Review of Systems     Objective:  There were no vitals filed for this visit. BP Readings from Last 3 Encounters:  01/29/23 112/80  10/03/22 (!) 153/78  09/05/22 (!) 140/70   Wt Readings from Last 3 Encounters:  01/29/23 198 lb 9.6 oz (90.1 kg)  10/13/22 200 lb (90.7 kg)  10/03/22 200 lb (90.7 kg)   There is no height or weight on file to calculate BMI.    Physical Exam         Assessment & Plan:    See Problem List for Assessment and Plan of chronic medical problems.

## 2023-04-03 ENCOUNTER — Ambulatory Visit (INDEPENDENT_AMBULATORY_CARE_PROVIDER_SITE_OTHER): Payer: Medicare HMO | Admitting: Internal Medicine

## 2023-04-03 VITALS — BP 140/76 | HR 70 | Temp 98.3°F | Ht 73.0 in | Wt 195.0 lb

## 2023-04-03 DIAGNOSIS — K5909 Other constipation: Secondary | ICD-10-CM | POA: Diagnosis not present

## 2023-04-03 MED ORDER — LINACLOTIDE 72 MCG PO CAPS: 72.0000 ug | ORAL_CAPSULE | Freq: Every day | ORAL | 5 refills | Status: DC

## 2023-04-03 NOTE — Assessment & Plan Note (Addendum)
Subacute Likely related to lifestyle change-has been eating less and eating differently He is still drinking plenty of water and exercising regularly He states Senokot works, but he did not think he could take it every day and has been taking it every 3 days He was interested in trying Linzess Discussed that he can take a stool softener on a daily basis and would have to adjust it for what he needs Can try low-dose Linzess-not sure if this is covered or not He is monitoring his weight closely at home and will continue-if he continues to have weight loss he knows he needs to come back to see me Start Linzess 72 mcg daily

## 2023-04-03 NOTE — Patient Instructions (Addendum)
     Stool softeners - colace or senokot.  Can take daily.        Medications changes include :   linzess 72 mcg daily      Return if symptoms worsen or fail to improve.

## 2023-04-08 ENCOUNTER — Other Ambulatory Visit: Payer: Self-pay | Admitting: Cardiovascular Disease

## 2023-04-09 ENCOUNTER — Telehealth: Payer: Self-pay | Admitting: Internal Medicine

## 2023-04-09 NOTE — Telephone Encounter (Signed)
Patient dropped off document Handicap Placard, to be filled out by provider. Patient requested to send it back via Call Patient to pick up within 7-days. Document is located in providers tray at front office.Please advise at Mobile [phone] (mobile)

## 2023-04-09 NOTE — Telephone Encounter (Signed)
Placed in Dr. Carver Fila folder today.

## 2023-04-10 NOTE — Telephone Encounter (Signed)
Spoke with patient and form taken up front for pick up.

## 2023-05-07 ENCOUNTER — Encounter: Payer: Medicare HMO | Admitting: *Deleted

## 2023-05-07 DIAGNOSIS — Z006 Encounter for examination for normal comparison and control in clinical research program: Secondary | ICD-10-CM

## 2023-05-07 MED ORDER — STUDY - ORION 4 - INCLISIRAN 300 MG/1.5 ML OR PLACEBO SQ INJECTION (PI-STUCKEY)
300.0000 mg | INJECTION | SUBCUTANEOUS | Status: DC
  Administered 2023-05-07: 300 mg via SUBCUTANEOUS
  Filled 2023-05-07: qty 1.5

## 2023-05-07 NOTE — Research (Signed)
Orion 4  Patient doing well, no complaints of chest pain or shortness of breath. One new med added to list No hospitalizations or urgent care visit.  Will see patient back November 07, 2023 @ 9am - fasting labs    Current Outpatient Medications:    Alcohol Swabs (B-D SINGLE USE SWABS REGULAR) PADS, 1 each by Does not apply route as needed., Disp: 3 each, Rfl: 3   amLODipine (NORVASC) 10 MG tablet, Take 1 tablet (10 mg total) by mouth daily., Disp: 90 tablet, Rfl: 3   Ascorbic Acid (VITAMIN C PO), Take 1 tablet by mouth daily., Disp: , Rfl:    aspirin 81 MG tablet, Take 1 tablet (81 mg total) by mouth daily., Disp: 30 tablet, Rfl:    Aspirin-Caffeine (BAYER BACK & BODY) 500-32.5 MG TABS, Take 1 tablet by mouth daily as needed (pain)., Disp: , Rfl:    clopidogrel (PLAVIX) 75 MG tablet, TAKE 1 TABLET EVERY DAY, Disp: 90 tablet, Rfl: 3   dorzolamide-timolol (COSOPT) 22.3-6.8 MG/ML ophthalmic solution, Place 1 drop into both eyes 2 (two) times daily. , Disp: , Rfl:    latanoprost (XALATAN) 0.005 % ophthalmic solution, Place 1 drop into both eyes at bedtime., Disp: , Rfl:    linaclotide (LINZESS) 72 MCG capsule, Take 1 capsule (72 mcg total) by mouth daily before breakfast., Disp: 30 capsule, Rfl: 5   Menthol-Methyl Salicylate (MUSCLE RUB) 10-15 % CREA, Apply 1 Application topically as needed for muscle pain., Disp: , Rfl:    nitroGLYCERIN (NITROSTAT) 0.4 MG SL tablet, PLACE 1 TABLET UNDER THE TONGUE AS NEEDED FOR CHEST PAIN, Disp: 25 tablet, Rfl: 9   OVER THE COUNTER MEDICATION, Take 2 tablets by mouth daily with lunch. Prostate Strong, Disp: , Rfl:    Polyvinyl Alcohol-Povidone (REFRESH OP), Place 1 drop into both eyes daily as needed (dry eyes)., Disp: , Rfl:    ranolazine (RANEXA) 500 MG 12 hr tablet, Take 1 tablet (500 mg total) by mouth 2 (two) times daily., Disp: 180 tablet, Rfl: 3   Study - ORION 4 - inclisiran 300 mg/1.56mL or placebo SQ injection (PI-Stuckey), Inject 1.5 mLs (300 mg total)  into the skin every 6 (six) months., Disp: 1 mL, Rfl: 0  Current Facility-Administered Medications:    Study - ORION 4 - inclisiran 300 mg/1.73mL or placebo SQ injection (PI-Stuckey), 300 mg, Subcutaneous, Q6 months, Herby Abraham, MD, 300 mg at 11/06/22 1122

## 2023-07-09 DIAGNOSIS — H401111 Primary open-angle glaucoma, right eye, mild stage: Secondary | ICD-10-CM | POA: Diagnosis not present

## 2023-07-09 DIAGNOSIS — H401122 Primary open-angle glaucoma, left eye, moderate stage: Secondary | ICD-10-CM | POA: Diagnosis not present

## 2023-10-04 ENCOUNTER — Inpatient Hospital Stay: Payer: Medicare HMO | Attending: Oncology | Admitting: Oncology

## 2023-10-04 VITALS — BP 151/85 | HR 69 | Temp 98.2°F | Resp 18 | Ht 73.0 in | Wt 198.0 lb

## 2023-10-04 DIAGNOSIS — Z8572 Personal history of non-Hodgkin lymphomas: Secondary | ICD-10-CM | POA: Diagnosis not present

## 2023-10-04 DIAGNOSIS — Z9221 Personal history of antineoplastic chemotherapy: Secondary | ICD-10-CM | POA: Diagnosis not present

## 2023-10-04 NOTE — Progress Notes (Signed)
  [name] OFFICE PROGRESS NOTE   Diagnosis: Non-Hodgkin's lymphoma  INTERVAL HISTORY:   Austin [name] returns as scheduled.  He feels well.  Good appetite.  No palpable lymph nodes.  No fever or night sweats.  Is exercising.  He reports being up-to-date on influenza and pneumonia vaccines.  Objective:  Vital signs in last 24 hours:  Blood pressure (!) 151/85, pulse 69, temperature 98.2 F (36.8 C), temperature source Temporal, resp. rate 18, height 6' 1 (1.854 m), weight 198 lb (89.8 kg), SpO2 100%.     Lymphatics: No cervical, supraclavicular, axillary, or inguinal nodes Resp: Lungs clear bilaterally with decreased breath sounds and dullness at the right lower posterior chest, no respiratory distress Cardio: Regular rate and rhythm GI: No hepatosplenomegaly Vascular: No leg edema   Lab Results:  Lab Results  Component Value Date   WBC 3.7 (L) 09/05/2022   HGB 13.0 09/05/2022   HCT 38.7 (L) 09/05/2022   MCV 90.0 09/05/2022   PLT 292.0 09/05/2022   NEUTROABS 2.2 09/05/2022    CMP  Lab Results  Component Value Date   NA 141 09/05/2022   K 4.6 09/05/2022   CL 103 09/05/2022   CO2 31 09/05/2022   GLUCOSE 95 09/05/2022   BUN 14 09/05/2022   CREATININE 1.33 09/05/2022   CALCIUM  10.2 09/05/2022   CALCIUM  10.4 09/05/2022   PROT 7.9 09/05/2022   ALBUMIN 4.4 09/05/2022   AST 23 09/05/2022   ALT 20 09/05/2022   ALKPHOS 104 09/05/2022   BILITOT 0.6 09/05/2022   GFRNONAA >60 04/17/2019   GFRAA >60 04/17/2019    Medications: I have reviewed the patient's current medications.   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, FLIPI 29 April 2003-rituximab weekly x 4 with clinical response August 2008 rituximab weekly x 4-clinical response 07/11/2007 - 07/12/2007-radiation to the bilateral neck October 2009-April 2010 Bendamustine/rituximab x 6 monthly cycles 11/10/2008-near CR by PET (minimal residual  avidity and abdominal mass) July 2010-March 2012 rituximab every 3 months for 7 doses August 2012 progression and ocular adnexa a September 2012 radiation, 200 cGy to bilateral ocular adnexa May 2013 progression isolated progression submandibular glands June 2013 radiation, 2 Elnor to bilateral submandibular glands September 2013 progression in cervical and inguinal lymph nodes January 2014 Bexxar radioimmunotherapy 75 mCi delivering 65 centigray total body dose 12/18/2012 CR by PET Coronary artery disease, status post myocardial infarction, coronary stents in place Hypertension 4.   history of kidney stones 5.   Hyperplasia of prostate     Disposition: Austin [name] is in clinical remission from lymphoma.  He would like [facility] at [name]  He will return for an office visit in 1 year.  The decreased breath sounds at the right lower chest is likely related to chronic elevation of the right diaphragm.  Arley Hof, MD  10/04/2023  2:57 PM

## 2023-10-12 ENCOUNTER — Other Ambulatory Visit: Payer: Self-pay | Admitting: Internal Medicine

## 2023-10-31 ENCOUNTER — Telehealth: Payer: Self-pay | Admitting: Cardiovascular Disease

## 2023-10-31 NOTE — Telephone Encounter (Signed)
  Per MyChart message:  Initial complaint: Chest pains //daily more than usually//soreness ln chest.   1. Are you having CP right now?    2. Are you experiencing any other symptoms (ex. SOB, nausea, vomiting, sweating)?   3. Is your CP continuous or coming and going?   4. Have you taken Nitroglycerin?   5. How long have you been experiencing CP?    6. If NO CP at time of call then end call with telling Pt to call back or call 911 if Chest pain returns prior to return call from triage team.    Q(1) no, (2) no, (3) coning/going. (4) no,have not taken nitroglycerin (5) about 1wk. Getting better now.

## 2023-10-31 NOTE — Telephone Encounter (Signed)
 Spoke with pt regarding his chest pain. Pt states last week was a particularly stressful week for him personally and he noticed he was having chest pain mostly with activity. Pt states he had chest pain with activities such as taking short walks to take the trash out and walking on the treadmill at the gym. This week the pt states he is no longer having chest pain but would like to be checked over. The pt denies any sob. An appointment was made with Jari Favre, PA for tomorrow (3/6) at 10:05 AM.

## 2023-10-31 NOTE — Progress Notes (Unsigned)
 Cardiology Office Note:  .   Date:  11/01/2023  ID:  Austin [name], DOB Mar 01, 1946, MRN [id number] PCP: Pincus Sanes, MD  [facility] Providers Cardiologist:  Tonny Bollman, MD Cardiology APP:  Beatrice Lecher, PA-C {  History of Present Illness: .   Austin [name] is a 78 y.o. male who is here for follow-up appointment.  Patient profile includes:  Coronary artery disease S/p Lat MI in 2012 tx with BMS to LCx PCI 4/17: DES to prox LAD PCI 7/17: DES to OM1 Myoview 12/19: ant-lat infarct, no ischemia PCI 03/2022: s/p 3 x 16 mm DES to OM1 Cath 06/15/22: LAD stent patent; apical LAD 80-90 (med Rx), small D1, D2 90 (med Rx); OM1 stent patent; mLCx 100 (CTO); pRCA 50, dRCA 50, oRPDA 80; multiple PDA/PLA branches w severe dz - Med Rx Follicular lymphoma, Gr I-II (Dr. Benita Gutter, [facility]) Hypertension Hyperlipidemia ORION 4 Trial participant Hx of TIA Aortic atherosclerosis    He was last seen by Dr. Excell Seltzer June of last year.  Was doing well at that time.  No chest pain but occasionally gets "soreness" in the chest.  No dyspnea, palpitations, edema, orthopnea, or PND.  No change in symptoms over the last 6 months.  Patient has a son that was getting married in [place].  Today, he presents with a history of coronary artery disease and multiple stents,  with chest pain that has been improving over the past week. The pain is described as central, sometimes radiating to the left or right, and is similar to the pain experienced prior to stent placement. The pain is sometimes noticed with activity, such as walking to the trash can in the morning, and subsides with rest. He denies associated shortness of breath.  The patient also reports a dry cough, which has been causing some lightheadedness. He denies any swelling in the feet or hands. He is currently enrolled in a study involving a cholesterol-lowering medication, which has already been approved by the FDA. Cholesterol labs  are confidential.  Reports no shortness of breath nor dyspnea on exertion.  No edema, orthopnea, PND. Reports no palpitations.    Discussed the use of AI scribe software for clinical note transcription with the patient, who gave verbal consent to proceed.   ROS: pertinent ROS in HPI  Studies Reviewed: Marland Kitchen   EKG Interpretation Date/Time:  Thursday November 01 2023 10:16:37 EST Ventricular Rate:  74 PR Interval:  188 QRS Duration:  84 QT Interval:  358 QTC Calculation: 397 R Axis:   25  Text Interpretation: Normal sinus rhythm Possible Lateral infarct , age undetermined Inferior-posterior infarct (cited on or before 01-Nov-2023) When compared with ECG of 30-Mar-2022 13:46, Borderline criteria for Lateral infarct are now Present T wave inversion less evident in Lateral leads Confirmed by Jari Favre [phone]) on 11/01/2023 10:28:08 AM    CARDIAC CATHETERIZATION   CARDIAC CATHETERIZATION 06/15/2022   Narrative Unchanged coronary anatomy from recent cardiac cath procedure with small vessel disease involving the diagonal branches of the LAD and the terminal branches of the RCA.  There is severe disease in those vessels, all of which are 1 mm or less in caliber.  The LAD is patent with mild to moderate stenosis but no focal severe lesions.  The recently implanted circumflex stent is patent.  The RCA is patent with mild nonobstructive disease.   Recommend continue current medical therapy.  Can increase antianginals if needed.   Findings Coronary Findings Diagnostic  Dominance:  Right   Left Anterior Descending The proximal LAD stented segment remains widely patent.  There is mild diffuse disease to the mid vessel.  The apical LAD has stable 80 to 90% stenosis unchanged from the prior study.  This is all unchanged from the recent cath study.  There is mild to moderate mid vessel stenosis present. Non-stenotic Prox LAD lesion was previously treated. Dist LAD lesion is 90% stenosed. Small vessel,  apical portion of LAD, unchanged from previous study   Lateral First Diagonal Branch Lat 1st Diag lesion is 90% stenosed. Small vessel unchanged from previous study   Second Diagonal Branch 2nd Diag lesion is 90% stenosed. Small vessel unchanged from previous study   Left Circumflex AV circumflex is occluded distally and this is essentially unchanged from the previous study. The first OM branch of the circumflex has new severe stenosis off of the distal edge of a previously implanted stent.  The recently implanted stent is widely patent with no restenosis.  The edges of the stent are patent with no stenosis. Collaterals Dist Cx filled by collaterals from Inf Sept.   Mid Cx lesion is 100% stenosed. Chronic occlusion unchanged from previous study   First Obtuse Marginal Branch Non-stenotic 1st Mrg-1 lesion was previously treated. TIMI-2 flow, eccentric plaque, new from last study Previously placed 1st Mrg-2 stent of unknown type is  widely patent. Non-stenotic 1st Mrg-3 lesion was previously treated. Vessel is the culprit lesion.   Third Obtuse Marginal Branch Collaterals 3rd Mrg filled by collaterals from Inf Sept.   Right Coronary Artery There is mild diffuse disease throughout the vessel. The RCA is diffusely diseased.  The proximal vessel has 40 to 50% stenosis in the distal vessel has 50% stenosis.  The vessel divides into multiple PDA and PLA branches with complex severe stenosis involving these bifurcations, not suitable for PCI.  The disease distribution in the RCA is unchanged from the recent cath study. Prox RCA lesion is 40% stenosed. Dist RCA lesion is 50% stenosed.   Right Posterior Descending Artery Ost RPDA to RPDA lesion is 50% stenosed. Unchanged from previous study RPDA lesion is 80% stenosed. The lesion is discrete. Small vessel, unchanged from previous study   Intervention   No interventions have been documented.     CARDIAC CATHETERIZATION   CARDIAC  CATHETERIZATION 03/30/2022   Narrative   Dist LAD lesion is 90% stenosed.   Mid Cx lesion is 100% stenosed.   Lat 1st Diag lesion is 90% stenosed.   2nd Diag lesion is 90% stenosed.   Ost RPDA to RPDA lesion is 50% stenosed.   RPDA lesion is 80% stenosed.   1st Mrg-3 lesion is 95% stenosed.   Prox RCA lesion is 40% stenosed.   Dist RCA lesion is 50% stenosed.   Non-stenotic 1st Mrg-2 lesion was previously treated.   Non-stenotic Prox LAD lesion was previously treated.   Non-stenotic 1st Mrg-1 lesion was previously treated.   A drug-eluting stent was successfully placed using a SYNERGY XD 3.0X16.   Post intervention, there is a 0% residual stenosis.   1.  Severe stenosis of the first OM branch of the circumflex (distal stent edge) treated successfully with PCI using a 3.0 x 16 mm Synergy DES 2.  Continued patency of the proximal LAD stent with no evidence of significant restenosis and stable apical LAD stenosis unchanged from the previous study 3.  Patent left main with no significant stenosis 4.  Patent but diffusely diseased RCA with severe stenosis involving  multiple bifurcation areas in the distal branch vessels not suitable for PCI   Recommendations: Same-day PCI protocol if criteria met, dual antiplatelet therapy with aspirin and clopidogrel minimum of 12 months favor long-term therapy in this patient with multiple PCI procedures   Findings Coronary Findings Diagnostic  Dominance: Right   Left Anterior Descending The proximal LAD stented segment remains widely patent.  There is mild diffuse disease to the mid vessel.  The apical LAD has stable 80 to 90% stenosis unchanged from the prior study Non-stenotic Prox LAD lesion was previously treated. Dist LAD lesion is 90% stenosed. Small vessel, apical portion of LAD, unchanged from previous study   Lateral First Diagonal Branch Lat 1st Diag lesion is 90% stenosed. Small vessel unchanged from previous study   Second Diagonal  Branch 2nd Diag lesion is 90% stenosed. Small vessel unchanged from previous study   Left Circumflex AV circumflex is occluded distally and this is essentially unchanged from the previous study. The first OM branch of the circumflex has new severe stenosis off of the distal edge of a previously implanted stent Collaterals Dist Cx filled by collaterals from Inf Sept.   Mid Cx lesion is 100% stenosed. Chronic occlusion unchanged from previous study   First Obtuse Marginal Branch Non-stenotic 1st Mrg-1 lesion was previously treated. TIMI-2 flow, eccentric plaque, new from last study Non-stenotic 1st Mrg-2 lesion was previously treated. 1st Mrg-3 lesion is 95% stenosed. Vessel is the culprit lesion.   Third Obtuse Marginal Branch Collaterals 3rd Mrg filled by collaterals from Inf Sept.   Right Coronary Artery There is mild diffuse disease throughout the vessel. The RCA is diffusely diseased.  The proximal vessel has 40 to 50% stenosis in the distal vessel has 50% stenosis.  The vessel divides into multiple PDA and PLA branches with complex severe stenosis involving these bifurcations, not really suitable for PCI. Prox RCA lesion is 40% stenosed. Dist RCA lesion is 50% stenosed.   Right Posterior Descending Artery Ost RPDA to RPDA lesion is 50% stenosed. Unchanged from previous study RPDA lesion is 80% stenosed. The lesion is discrete. Small vessel, unchanged from previous study   Intervention   1st Mrg-3 lesion Stent CATH LAUNCHER 6FR EBU3.5 guide catheter was inserted. Lesion crossed with guidewire using a WIRE COUGAR XT STRL 190CM. Pre-stent angioplasty was performed using a BALLN SAPPHIRE 2.5X12. A drug-eluting stent was successfully placed using a SYNERGY XD 3.0X16. Post-stent angioplasty was performed using a BALL SAPPHIRE NC24 3.25X12. Post-Intervention Lesion Assessment The intervention was successful. Pre-interventional TIMI flow is 3. Post-intervention TIMI flow is 3. No  complications occurred at this lesion. There is a 0% residual stenosis post intervention.   STRESS TESTS   MYOCARDIAL PERFUSION IMAGING 08/12/2018   Narrative  Nuclear stress EF: 55%.  Blood pressure demonstrated a normal response to exercise.  Horizontal ST segment depression ST segment depression of 1 mm was noted during stress in the V4 and V5 leads, and returning to baseline after less than 1 minute of recovery.  This is a low risk study.  The left ventricular ejection fraction is normal (55-65%).   1. EF 55%, normal wall motion. 2. Primarily fixed medium-sized, moderate intensity basal to mid anterolateral perfusion defect with some reversibility at the base.  This appears to be primarily anterolateral infarction though there surprisingly does not appear to be a significant wall motion abnormality. 3. Borderline significant near 1 mm ST depression V4 and V5 at peak exercise, resolving quickly in recovery.   Overall, I  think this is a low risk study.  Most consistent with anterolateral infarction.   ECHOCARDIOGRAM   ECHOCARDIOGRAM COMPLETE 08/30/2016   Narrative *[place]* *[facility]* [address] [place], [place] [address] [phone]   ------------------------------------------------------------------- Transthoracic Echocardiography   Patient:    Austin [name], Austin [name] MR #:       [id number] Study Date: 08/30/2016 Gender:     M Age:        77 Height:     185.4 cm Weight:     88.7 kg BSA:        2.15 m^2 Pt. Status: Room:       5W08C   ADMITTING    Ozella Rocks ATTENDING    Rosey Bath REFERRING    Joseph Art PERFORMING   [facility], Inpatient SONOGRAPHER  Thurman Coyer   cc:   ------------------------------------------------------------------- LV EF: 55% -   60%   ------------------------------------------------------------------- Indications:      Dyspnea 786.09.    ------------------------------------------------------------------- History:   PMH:   Murmur.  Coronary artery disease.  PMH: Myocardial infarction.  Transient ischemic attack.  Risk factors: Hypertension. Dyslipidemia.   ------------------------------------------------------------------- Study Conclusions   - Left ventricle: The cavity size was normal. Wall thickness was normal. Systolic function was normal. The estimated ejection fraction was in the range of 55% to 60%. Although no diagnostic regional wall motion abnormality was identified, this possibility cannot be completely excluded on the basis of this study. Doppler parameters are consistent with abnormal left ventricular relaxation (grade 1 diastolic dysfunction). - Aortic valve: There was no stenosis. There was trivial regurgitation. - Aorta: Dilated aortic root. Aortic root dimension: 42 mm (ED). - Mitral valve: There was no significant regurgitation. - Right ventricle: The cavity size was normal. Systolic function was normal. - Pericardium, extracardiac: A trivial pericardial effusion was identified.   Impressions:   - Normal LV size and systolic function, EF 55-60%. Normal RV size and systolic function. No significant valvular abnormalities.   ------------------------------------------------------------------- Study data:  No prior study was available for comparison.  Study status:  Routine.  Procedure:  The patient reported no pain pre or post test. Transthoracic echocardiography. Image quality was adequate.  Study completion:  There were no complications. Transthoracic echocardiography.  M-mode, complete 2D, spectral Doppler, and color Doppler.  Birthdate:  Patient birthdate: 1946-01-05.  Age:  Patient is 78 yr old.  Sex:  Gender: male. BMI: 25.8 kg/m^2.  Blood pressure:     126/68  Patient status: Inpatient.  Study date:  Study date: 08/30/2016. Study time: 11:07 AM.  Location:  Echo laboratory.    -------------------------------------------------------------------   ------------------------------------------------------------------- Left ventricle:  The cavity size was normal. Wall thickness was normal. Systolic function was normal. The estimated ejection fraction was in the range of 55% to 60%. Although no diagnostic regional wall motion abnormality was identified, this possibility cannot be completely excluded on the basis of this study. Doppler parameters are consistent with abnormal left ventricular relaxation (grade 1 diastolic dysfunction).   ------------------------------------------------------------------- Aortic valve:   Trileaflet.  Doppler:   There was no stenosis. There was trivial regurgitation.   ------------------------------------------------------------------- Aorta:  Dilated aortic root.   ------------------------------------------------------------------- Mitral valve:   Normal thickness leaflets .  Doppler:   There was no evidence for stenosis.   There was no significant regurgitation.   ------------------------------------------------------------------- Left atrium:  The atrium was normal in size.   ------------------------------------------------------------------- Right ventricle:  The cavity size  was normal. Systolic function was normal.   ------------------------------------------------------------------- Pulmonic valve:    Structurally normal valve.   Cusp separation was normal.  Doppler:  Transvalvular velocity was within the normal range. There was trivial regurgitation.   ------------------------------------------------------------------- Tricuspid valve:   Doppler:  There was trivial regurgitation.   ------------------------------------------------------------------- Right atrium:  The atrium was normal in size.   ------------------------------------------------------------------- Pericardium:  A trivial pericardial effusion was  identified.   ------------------------------------------------------------------- Post procedure conclusions Ascending Aorta:   - Dilated aortic root.   ------------------------------------------------------------------- Measurements   Left ventricle                         Value        Reference LV ID, ED, PLAX chordal        (H)     52.3  mm     43 - 52 LV ID, ES, PLAX chordal                36.3  mm     23 - 38 LV fx shortening, PLAX chordal         31    %      >=29 LV PW thickness, ED                    11    mm     ---------- IVS/LV PW ratio, ED                    0.86         <=1.3 LV e&', lateral                         6.31  cm/s   ---------- LV e&', medial                          4.79  cm/s   ---------- LV e&', average                         5.55  cm/s   ----------   Ventricular septum                     Value        Reference IVS thickness, ED                      9.47  mm     ----------   LVOT                                   Value        Reference LVOT ID, S                             22    mm     ---------- LVOT area                              3.8   cm^2   ----------   Aorta  Value        Reference Aortic root ID, ED                     42    mm     ----------   Left atrium                            Value        Reference LA ID, A-P, ES                         35    mm     ---------- LA ID/bsa, A-P                         1.63  cm/m^2 <=2.2 LA volume, S                           51.6  ml     ---------- LA volume/bsa, S                       24    ml/m^2 ---------- LA volume, ES, 1-p A4C                 42.9  ml     ---------- LA volume/bsa, ES, 1-p A4C             20    ml/m^2 ---------- LA volume, ES, 1-p A2C                 61.3  ml     ---------- LA volume/bsa, ES, 1-p A2C             28.6  ml/m^2 ----------   Tricuspid valve                        Value        Reference Tricuspid regurg peak velocity         171    cm/s   ---------- Tricuspid peak RV-RA gradient          12    mm Hg  ----------   Right atrium                           Value        Reference RA ID, S-I, ES, A4C            (H)     53.3  mm     34 - 49 RA area, ES, A4C                       13.4  cm^2   8.3 - 19.5 RA volume, ES, A/L                     23.6  ml     ---------- RA volume/bsa, ES, A/L                 11    ml/m^2 ----------   Right ventricle                        Value        Reference TAPSE  21.7  mm     ---------- RV s&', lateral, S                      14.8  cm/s   ----------   Legend: (L)  and  (H)  mark values outside specified reference range.   ------------------------------------------------------------------- Prepared and Electronically Authenticated by   Marca Ancona, M.D. 2018-01-03T14:22:56  Physical Exam:   VS:  BP 136/74   Pulse 74   Ht 6\' 1"  (1.854 m)   Wt 198 lb (89.8 kg)   SpO2 97%   BMI 26.12 kg/m    Wt Readings from Last 3 Encounters:  11/01/23 198 lb (89.8 kg)  10/04/23 198 lb (89.8 kg)  05/07/23 198 lb (89.8 kg)    GEN: Well nourished, well developed in no acute distress NECK: No JVD; No carotid bruits CARDIAC: RRR, no murmurs, rubs, gallops RESPIRATORY:  Clear to auscultation without rales, wheezing or rhonchi  ABDOMEN: Soft, non-tender, non-distended EXTREMITIES:  No edema; No deformity   ASSESSMENT AND PLAN: .    Chest Pain Central chest pain, radiating to both sides, exacerbated by activity and relieved by rest. Similar to pain experienced prior to previous stent placements. EKG normal, but given extensive history of coronary disease and stents, concern for possible ischemic etiology. -Schedule stress test as soon as possible for further evaluation. -If chest pain recurs, consider increasing Ranexa to 1000mg  twice daily or use nitroglycerin as needed.  Dry Cough Likely secondary to recent illness. -Advise over-the-counter cough medications,  avoiding those containing phenylephrine due to potential for increased heart rate.  Participation in Clinical Study Ongoing, with next appointment on 19th of this month. -Continue participation as per study protocol.  Follow-up After stress test results, consider possible catheterization with Dr. Excell Seltzer if necessary      Signed, Sharlene Dory, PA-C

## 2023-11-01 ENCOUNTER — Ambulatory Visit: Attending: Physician Assistant | Admitting: Physician Assistant

## 2023-11-01 ENCOUNTER — Encounter: Payer: Self-pay | Admitting: Physician Assistant

## 2023-11-01 VITALS — BP 136/74 | HR 74 | Ht 73.0 in | Wt 198.0 lb

## 2023-11-01 DIAGNOSIS — I25119 Atherosclerotic heart disease of native coronary artery with unspecified angina pectoris: Secondary | ICD-10-CM | POA: Diagnosis not present

## 2023-11-01 DIAGNOSIS — R079 Chest pain, unspecified: Secondary | ICD-10-CM

## 2023-11-01 DIAGNOSIS — E782 Mixed hyperlipidemia: Secondary | ICD-10-CM

## 2023-11-01 DIAGNOSIS — Z006 Encounter for examination for normal comparison and control in clinical research program: Secondary | ICD-10-CM | POA: Diagnosis not present

## 2023-11-01 DIAGNOSIS — I10 Essential (primary) hypertension: Secondary | ICD-10-CM

## 2023-11-01 DIAGNOSIS — I700 Atherosclerosis of aorta: Secondary | ICD-10-CM | POA: Diagnosis not present

## 2023-11-01 NOTE — Patient Instructions (Addendum)
 Medication Instructions:   Your physician recommends that you continue on your current medications as directed. Please refer to the Current Medication list given to you today.   *If you need a refill on your cardiac medications before your next appointment, please call your pharmacy*   Lab Work: NONE ORDERED  TODAY    If you have labs (blood work) drawn today and your tests are completely normal, you will receive your results only by: MyChart Message (if you have MyChart) OR A paper copy in the mail If you have any lab test that is abnormal or we need to change your treatment, we will call you to review the results.   Testing/Procedures: Your physician has requested that you have a lexiscan myoview. For further information please visit [email or web address] Please follow instruction sheet, as given.    Follow-Up: At [facility], you and your health needs are our priority.  As part of our continuing mission to provide you with exceptional heart care, we have created designated Provider Care Teams.  These Care Teams include your primary Cardiologist (physician) and Advanced Practice Providers (APPs -  Physician Assistants and Nurse Practitioners) who all work together to provide you with the care you need, when you need it.  We recommend signing up for the patient portal called "MyChart".  Sign up information is provided on this After Visit Summary.  MyChart is used to connect with patients for Virtual Visits (Telemedicine).  Patients are able to view lab/test results, encounter notes, upcoming appointments, etc.  Non-urgent messages can be sent to your provider as well.   To learn more about what you can do with MyChart, go to [email or web address].    Your next appointment:    2 -3 month(s)   Provider:    Tonny Bollman, MD  or Jari Favre, PA-C or Tereso Newcomer, PA-C         Other Instructions        1st Floor: - Lobby - Registration  - Pharmacy  - Lab -  Cafe  2nd Floor: - PV Lab - Diagnostic Testing (echo, CT, nuclear med)  3rd Floor: - Vacant  4th Floor: - TCTS (cardiothoracic surgery) - AFib Clinic - Structural Heart Clinic - Vascular Surgery  - Vascular Ultrasound  5th Floor: - HeartCare Cardiology (general and EP) - Clinical Pharmacy for coumadin, hypertension, lipid, weight-loss medications, and med management appointments    Valet parking services will be available as well.

## 2023-11-05 ENCOUNTER — Telehealth (HOSPITAL_COMMUNITY): Payer: Self-pay

## 2023-11-05 NOTE — Telephone Encounter (Signed)
 Spoke with the patient, detailed instructions given. He stated that he would be here for is test. Asked to call back with any questions. S.Charla Criscione CCT

## 2023-11-06 ENCOUNTER — Ambulatory Visit (HOSPITAL_COMMUNITY): Attending: Internal Medicine

## 2023-11-06 DIAGNOSIS — R079 Chest pain, unspecified: Secondary | ICD-10-CM | POA: Diagnosis not present

## 2023-11-06 LAB — MYOCARDIAL PERFUSION IMAGING
LV dias vol: 99 mL (ref 62–150)
LV sys vol: 45 mL
Nuc Stress EF: 54 %
Peak HR: 80 {beats}/min
Rest HR: 55 {beats}/min
Rest Nuclear Isotope Dose: 10.8 mCi
SDS: 0
SRS: 8
SSS: 9
ST Depression (mm): 0 mm
Stress Nuclear Isotope Dose: 30.6 mCi
TID: 1.26

## 2023-11-06 MED ORDER — TECHNETIUM TC 99M TETROFOSMIN IV KIT
10.8000 | PACK | Freq: Once | INTRAVENOUS | Status: AC | PRN
  Administered 2023-11-06: 10.8 via INTRAVENOUS

## 2023-11-06 MED ORDER — REGADENOSON 0.4 MG/5ML IV SOLN
0.4000 mg | Freq: Once | INTRAVENOUS | Status: AC
  Administered 2023-11-06: 0.4 mg via INTRAVENOUS

## 2023-11-06 MED ORDER — TECHNETIUM TC 99M TETROFOSMIN IV KIT
30.6000 | PACK | Freq: Once | INTRAVENOUS | Status: AC | PRN
  Administered 2023-11-06: 30.6 via INTRAVENOUS

## 2023-11-08 ENCOUNTER — Encounter: Payer: Self-pay | Admitting: Physician Assistant

## 2023-11-12 NOTE — Progress Notes (Signed)
 This encounter was created in error - please disregard.  Called [facility] With No answer.  LDM for pt to call office to reschedule visit.

## 2023-11-13 ENCOUNTER — Encounter: Admitting: *Deleted

## 2023-11-13 ENCOUNTER — Other Ambulatory Visit: Payer: Self-pay | Admitting: Internal Medicine

## 2023-11-13 DIAGNOSIS — Z006 Encounter for examination for normal comparison and control in clinical research program: Secondary | ICD-10-CM

## 2023-11-13 MED ORDER — STUDY - ORION 4 - INCLISIRAN 300 MG/1.5 ML OR PLACEBO SQ INJECTION (PI-STUCKEY)
300.0000 mg | INJECTION | SUBCUTANEOUS | Status: AC
  Administered 2023-11-13: 300 mg via SUBCUTANEOUS
  Filled 2023-11-13: qty 1.5

## 2023-11-13 NOTE — Research (Signed)
 ORION 4  Patient doing well,  No changes in his meds Was seen at Dr for chest pains - related to stress no med changes or changes to subject IP given    Current Outpatient Medications:    Alcohol Swabs (B-D SINGLE USE SWABS REGULAR) PADS, 1 each by Does not apply route as needed., Disp: 3 each, Rfl: 3   amLODipine (NORVASC) 10 MG tablet, Take 1 tablet (10 mg total) by mouth daily., Disp: 90 tablet, Rfl: 3   Ascorbic Acid (VITAMIN C PO), Take 1 tablet by mouth daily., Disp: , Rfl:    aspirin 81 MG tablet, Take 1 tablet (81 mg total) by mouth daily., Disp: 30 tablet, Rfl:    Aspirin-Caffeine (BAYER BACK & BODY) 500-32.5 MG TABS, Take 1 tablet by mouth daily as needed (pain)., Disp: , Rfl:    clopidogrel (PLAVIX) 75 MG tablet, TAKE 1 TABLET EVERY DAY, Disp: 90 tablet, Rfl: 3   dorzolamide-timolol (COSOPT) 22.3-6.8 MG/ML ophthalmic solution, Place 1 drop into both eyes 2 (two) times daily. , Disp: , Rfl:    latanoprost (XALATAN) 0.005 % ophthalmic solution, Place 1 drop into both eyes at bedtime., Disp: , Rfl:    linaclotide (LINZESS) 72 MCG capsule, TAKE 1 CAPSULE(72 MCG) BY MOUTH DAILY BEFORE BREAKFAST, Disp: 30 capsule, Rfl: 0   Menthol-Methyl Salicylate (MUSCLE RUB) 10-15 % CREA, Apply 1 Application topically as needed for muscle pain., Disp: , Rfl:    nitroGLYCERIN (NITROSTAT) 0.4 MG SL tablet, PLACE 1 TABLET UNDER THE TONGUE AS NEEDED FOR CHEST PAIN, Disp: 25 tablet, Rfl: 9   OVER THE COUNTER MEDICATION, Take 2 tablets by mouth daily with lunch. Prostate Strong, Disp: , Rfl:    Polyvinyl Alcohol-Povidone (REFRESH OP), Place 1 drop into both eyes daily as needed (dry eyes)., Disp: , Rfl:    ranolazine (RANEXA) 500 MG 12 hr tablet, Take 1 tablet (500 mg total) by mouth 2 (two) times daily., Disp: 180 tablet, Rfl: 3   Study - ORION 4 - inclisiran 300 mg/1.52mL or placebo SQ injection (PI-Stuckey), Inject 1.5 mLs (300 mg total) into the skin every 6 (six) months., Disp: 1 mL, Rfl: 0  Current  Facility-Administered Medications:    Study - ORION 4 - inclisiran 300 mg/1.77mL or placebo SQ injection (PI-Stuckey), 300 mg, Subcutaneous, Q6 months, , 300 mg at 05/07/23 1042

## 2023-11-14 NOTE — Telephone Encounter (Signed)
 30-day supply sent to pharmacy-please schedule

## 2023-12-06 ENCOUNTER — Encounter: Payer: Self-pay | Admitting: Internal Medicine

## 2023-12-06 NOTE — Progress Notes (Unsigned)
 Subjective:    Patient ID: Austin [name], male    DOB: [birth year], 78 y.o.   MRN: [id number]     HPI [name] is here for a physical exam and his chronic medical problems.   Doing well - no concerns.    Has hemorrhoids and has always used prep- H.  BM are normal.    Medications and allergies reviewed with patient and updated if appropriate.  Current Outpatient Medications on File Prior to Visit  Medication Sig Dispense Refill   Alcohol Swabs (B-D SINGLE USE SWABS REGULAR) PADS 1 each by Does not apply route as needed. 3 each 3   amLODipine (NORVASC) 10 MG tablet Take 1 tablet (10 mg total) by mouth daily. 90 tablet 3   Ascorbic Acid (VITAMIN C PO) Take 1 tablet by mouth daily.     aspirin 81 MG tablet Take 1 tablet (81 mg total) by mouth daily. 30 tablet    Aspirin-Caffeine (BAYER BACK & BODY) 500-32.5 MG TABS Take 1 tablet by mouth daily as needed (pain).     clopidogrel (PLAVIX) 75 MG tablet TAKE 1 TABLET EVERY DAY 90 tablet 3   dorzolamide-timolol (COSOPT) 22.3-6.8 MG/ML ophthalmic solution Place 1 drop into both eyes 2 (two) times daily.      latanoprost (XALATAN) 0.005 % ophthalmic solution Place 1 drop into both eyes at bedtime.     LINZESS 72 MCG capsule TAKE 1 CAPSULE(72 MCG) BY MOUTH DAILY BEFORE BREAKFAST 30 capsule 0   Menthol-Methyl Salicylate (MUSCLE RUB) 10-15 % CREA Apply 1 Application topically as needed for muscle pain.     nitroGLYCERIN (NITROSTAT) 0.4 MG SL tablet PLACE 1 TABLET UNDER THE TONGUE AS NEEDED FOR CHEST PAIN 25 tablet 9   OVER THE COUNTER MEDICATION Take 2 tablets by mouth daily with lunch. Prostate Strong     Polyvinyl Alcohol-Povidone (REFRESH OP) Place 1 drop into both eyes daily as needed (dry eyes).     ranolazine (RANEXA) 500 MG 12 hr tablet Take 1 tablet (500 mg total) by mouth 2 (two) times daily. 180 tablet 3   Study - ORION 4 - inclisiran 300 mg/1.5mL or placebo SQ injection (PI-Stuckey) Inject 1.5 mLs (300 mg total) into the skin  every 6 (six) months. 1 mL 0   No current facility-administered medications on file prior to visit.    Review of Systems  Constitutional:  Negative for fever.  Eyes:  Negative for visual disturbance.  Respiratory:  Negative for cough, shortness of breath and wheezing.   Cardiovascular:  Negative for chest pain, palpitations and leg swelling.  Gastrointestinal:  Positive for constipation (controlled). Negative for abdominal pain, blood in stool and diarrhea.       No gerd  Genitourinary:  Negative for difficulty urinating and dysuria.  Musculoskeletal:  Positive for arthralgias (mild). Negative for back pain.  Skin:  Negative for rash.  Neurological:  Negative for light-headedness and headaches.  Psychiatric/Behavioral:  Negative for dysphoric mood. The patient is not nervous/anxious.        Objective:   Vitals:   12/07/23 1353  BP: 126/84  Pulse: 66  Temp: 98.2 F (36.8 C)  SpO2: 98%   Filed Weights   12/07/23 1353  Weight: 190 lb (86.2 kg)   Body mass index is 25.07 kg/m.  BP Readings from Last 3 Encounters:  12/07/23 126/84  11/01/23 136/74  10/04/23 (!) 151/85    Wt Readings from Last 3 Encounters:  12/07/23 190 lb (86.2 kg)  11/01/23  198 lb (89.8 kg)  10/04/23 198 lb (89.8 kg)      Physical Exam Constitutional: He appears well-developed and well-nourished. No distress.  HENT:  Head: Normocephalic and atraumatic.  Right Ear: External ear normal.  Left Ear: External ear normal.  Normal ear canals and TM b/l  Mouth/Throat: Oropharynx is clear and moist. Eyes: Conjunctivae and EOM are normal.  Neck: Neck supple. No tracheal deviation present. No thyromegaly present.  No carotid bruit  Cardiovascular: Normal rate, regular rhythm, normal heart sounds and intact distal pulses.   No murmur heard.  No lower extremity edema. Pulmonary/Chest: Effort normal and breath sounds normal. No respiratory distress. He has no wheezes. He has no rales.  Abdominal: Soft.  He exhibits no distension. There is no tenderness.  Genitourinary: deferred  Lymphadenopathy:   He has no cervical adenopathy.  Skin: Skin is warm and dry. He is not diaphoretic.  Psychiatric: He has a normal mood and affect. His behavior is normal.         Assessment & Plan:   Physical exam: Screening blood work  ordered Exercise   regular Weight  is normal Substance abuse   none   Reviewed recommended immunizations.   Health Maintenance  Topic Date Due   Medicare Annual Wellness (AWV)  10/14/2023   COVID-19 Vaccine (4 - 2024-25 season) 12/23/2023 (Originally 04/29/2023)   DTaP/Tdap/Td (2 - Tdap) 12/06/2024 (Originally 05/16/2016)   INFLUENZA VACCINE  03/28/2024   Pneumonia Vaccine 26+ Years old  Completed   Hepatitis C Screening  Completed   Zoster Vaccines- Shingrix  Completed   HPV VACCINES  Aged Out   Meningococcal B Vaccine  Aged Out   Colonoscopy  Discontinued     See Problem List for Assessment and Plan of chronic medical problems.

## 2023-12-06 NOTE — Patient Instructions (Addendum)
 Blood work was ordered.       Medications changes include :   None    A referral was ordered and someone will call you to schedule an appointment.     Return in about 1 year (around 12/06/2024) for Physical Exam.   Health Maintenance, Male Adopting a healthy lifestyle and getting preventive care are important in promoting health and wellness. Ask your health care provider about: The right schedule for you to have regular tests and exams. Things you can do on your own to prevent diseases and keep yourself healthy. What should I know about diet, weight, and exercise? Eat a healthy diet  Eat a diet that includes plenty of vegetables, fruits, low-fat dairy products, and lean protein. Do not eat a lot of foods that are high in solid fats, added sugars, or sodium. Maintain a healthy weight Body mass index (BMI) is a measurement that can be used to identify possible weight problems. It estimates body fat based on height and weight. Your health care provider can help determine your BMI and help you achieve or maintain a healthy weight. Get regular exercise Get regular exercise. This is one of the most important things you can do for your health. Most adults should: Exercise for at least 150 minutes each week. The exercise should increase your heart rate and make you sweat (moderate-intensity exercise). Do strengthening exercises at least twice a week. This is in addition to the moderate-intensity exercise. Spend less time sitting. Even light physical activity can be beneficial. Watch cholesterol and blood lipids Have your blood tested for lipids and cholesterol at 79 years of age, then have this test every 5 years. You may need to have your cholesterol levels checked more often if: Your lipid or cholesterol levels are high. You are older than 79 years of age. You are at high risk for heart disease. What should I know about cancer screening? Many types of cancers can be detected  early and may often be prevented. Depending on your health history and family history, you may need to have cancer screening at various ages. This may include screening for: Colorectal cancer. Prostate cancer. Skin cancer. Lung cancer. What should I know about heart disease, diabetes, and high blood pressure? Blood pressure and heart disease High blood pressure causes heart disease and increases the risk of stroke. This is more likely to develop in people who have high blood pressure readings or are overweight. Talk with your health care provider about your target blood pressure readings. Have your blood pressure checked: Every 3-5 years if you are 72-35 years of age. Every year if you are 49 years old or older. If you are between the ages of 34 and 41 and are a current or former smoker, ask your health care provider if you should have a one-time screening for abdominal aortic aneurysm (AAA). Diabetes Have regular diabetes screenings. This checks your fasting blood sugar level. Have the screening done: Once every three years after age 28 if you are at a normal weight and have a low risk for diabetes. More often and at a younger age if you are overweight or have a high risk for diabetes. What should I know about preventing infection? Hepatitis B If you have a higher risk for hepatitis B, you should be screened for this virus. Talk with your health care provider to find out if you are at risk for hepatitis B infection. Hepatitis C Blood testing is recommended for:  Everyone born from [birth year] through 1966. Anyone with known risk factors for hepatitis C. Sexually transmitted infections (STIs) You should be screened each year for STIs, including gonorrhea and chlamydia, if: You are sexually active and are younger than 79 years of age. You are older than 79 years of age and your health care provider tells you that you are at risk for this type of infection. Your sexual activity has changed since  you were last screened, and you are at increased risk for chlamydia or gonorrhea. Ask your health care provider if you are at risk. Ask your health care provider about whether you are at high risk for HIV. Your health care provider may recommend a prescription medicine to help prevent HIV infection. If you choose to take medicine to prevent HIV, you should first get tested for HIV. You should then be tested every 3 months for as long as you are taking the medicine. Follow these instructions at home: Alcohol use Do not drink alcohol if your health care provider tells you not to drink. If you drink alcohol: Limit how much you have to 0-2 drinks a day. Know how much alcohol is in your drink. In the [place] one drink equals one 12 oz bottle of beer (355 mL), one 5 oz glass of wine (148 mL), or one 1 oz glass of hard liquor (44 mL). Lifestyle Do not use any products that contain nicotine or tobacco. These products include cigarettes, chewing tobacco, and vaping devices, such as e-cigarettes. If you need help quitting, ask your health care provider. Do not use street drugs. Do not share needles. Ask your health care provider for help if you need support or information about quitting drugs. General instructions Schedule regular health, dental, and eye exams. Stay current with your vaccines. Tell your health care provider if: You often feel depressed. You have ever been abused or do not feel safe at home. Summary Adopting a healthy lifestyle and getting preventive care are important in promoting health and wellness. Follow your health care provider's instructions about healthy diet, exercising, and getting tested or screened for diseases. Follow your health care provider's instructions on monitoring your cholesterol and blood pressure. This information is not intended to replace advice given to you by your health care provider. Make sure you discuss any questions you have with your health care  provider. Document Revised: 01/03/2021 Document Reviewed: 01/03/2021 Elsevier Patient Education  2024 [employer].

## 2023-12-07 ENCOUNTER — Ambulatory Visit: Admitting: Internal Medicine

## 2023-12-07 VITALS — BP 126/84 | HR 66 | Temp 98.2°F | Ht 73.0 in | Wt 190.0 lb

## 2023-12-07 DIAGNOSIS — Z0000 Encounter for general adult medical examination without abnormal findings: Secondary | ICD-10-CM

## 2023-12-07 DIAGNOSIS — K5909 Other constipation: Secondary | ICD-10-CM

## 2023-12-07 DIAGNOSIS — I25119 Atherosclerotic heart disease of native coronary artery with unspecified angina pectoris: Secondary | ICD-10-CM

## 2023-12-07 DIAGNOSIS — E782 Mixed hyperlipidemia: Secondary | ICD-10-CM | POA: Diagnosis not present

## 2023-12-07 DIAGNOSIS — I10 Essential (primary) hypertension: Secondary | ICD-10-CM | POA: Diagnosis not present

## 2023-12-07 DIAGNOSIS — R7303 Prediabetes: Secondary | ICD-10-CM | POA: Diagnosis not present

## 2023-12-07 LAB — CBC WITH DIFFERENTIAL/PLATELET
Basophils Absolute: 0 10*3/uL (ref 0.0–0.1)
Basophils Relative: 0.6 % (ref 0.0–3.0)
Eosinophils Absolute: 0.1 10*3/uL (ref 0.0–0.7)
Eosinophils Relative: 2 % (ref 0.0–5.0)
HCT: 39.4 % (ref 39.0–52.0)
Hemoglobin: 13.4 g/dL (ref 13.0–17.0)
Lymphocytes Relative: 24.2 % (ref 12.0–46.0)
Lymphs Abs: 1.2 10*3/uL (ref 0.7–4.0)
MCHC: 34 g/dL (ref 30.0–36.0)
MCV: 91.3 fl (ref 78.0–100.0)
Monocytes Absolute: 0.6 10*3/uL (ref 0.1–1.0)
Monocytes Relative: 12.1 % — ABNORMAL HIGH (ref 3.0–12.0)
Neutro Abs: 3.1 10*3/uL (ref 1.4–7.7)
Neutrophils Relative %: 61.1 % (ref 43.0–77.0)
Platelets: 277 10*3/uL (ref 150.0–400.0)
RBC: 4.31 Mil/uL (ref 4.22–5.81)
RDW: 13.4 % (ref 11.5–15.5)
WBC: 5 10*3/uL (ref 4.0–10.5)

## 2023-12-07 LAB — COMPREHENSIVE METABOLIC PANEL WITH GFR
ALT: 16 U/L (ref 0–53)
AST: 22 U/L (ref 0–37)
Albumin: 4.6 g/dL (ref 3.5–5.2)
Alkaline Phosphatase: 106 U/L (ref 39–117)
BUN: 18 mg/dL (ref 6–23)
CO2: 32 meq/L (ref 19–32)
Calcium: 10.5 mg/dL (ref 8.4–10.5)
Chloride: 101 meq/L (ref 96–112)
Creatinine, Ser: 1.36 mg/dL (ref 0.40–1.50)
GFR: 49.67 mL/min — ABNORMAL LOW (ref >60.00)
Glucose, Bld: 95 mg/dL (ref 70–99)
Potassium: 4.7 meq/L (ref 3.5–5.1)
Sodium: 139 meq/L (ref 135–145)
Total Bilirubin: 0.6 mg/dL (ref 0.2–1.2)
Total Protein: 7.8 g/dL (ref 6.0–8.3)

## 2023-12-07 LAB — TSH: TSH: 2.11 u[IU]/mL (ref 0.35–5.50)

## 2023-12-07 LAB — HEMOGLOBIN A1C: Hgb A1c MFr Bld: 6 % (ref 4.6–6.5)

## 2023-12-07 NOTE — Assessment & Plan Note (Signed)
 Chronic Lab Results  Component Value Date   HGBA1C 6.1 09/05/2022   Check a1c Low sugar / carb diet Stressed regular exercise

## 2023-12-07 NOTE — Assessment & Plan Note (Signed)
Chronic Statin, Zetia intolerant Regular exercise and healthy diet encouraged Currently in trial for new cholesterol medication

## 2023-12-07 NOTE — Assessment & Plan Note (Signed)
 Chronic Controlled, stable Continue Linzess 72 mcg daily

## 2023-12-07 NOTE — Assessment & Plan Note (Signed)
 Chronic BP well controlled at home and overall looks to be well-controlled Continue amlodipine 10 mg daily Cmp, cbc

## 2023-12-07 NOTE — Assessment & Plan Note (Signed)
Chronic History of MI, multiple stents Denies chest pain, shortness of breath Statin, Zetia intolerant Currently in trial of new cholesterol-lowering medication Continue aspirin 81 mg daily, Plavix 75 mg daily, Ranexa 500 mg twice daily Blood pressure well-controlled

## 2023-12-08 ENCOUNTER — Encounter: Payer: Self-pay | Admitting: Internal Medicine

## 2023-12-21 ENCOUNTER — Other Ambulatory Visit: Payer: Self-pay | Admitting: Internal Medicine

## 2024-01-07 ENCOUNTER — Ambulatory Visit

## 2024-01-23 ENCOUNTER — Other Ambulatory Visit: Payer: Self-pay | Admitting: Internal Medicine

## 2024-01-23 NOTE — Telephone Encounter (Signed)
 Copied from CRM [id number]. Topic: Clinical - Medication Refill >> Jan 23, 2024  9:32 AM Clydene Darner H wrote: Medication: Linzess    Has the patient contacted their pharmacy? Yes  The patient was informed by his pharmacy that they have been trying to contact his PCP regarding this prescription refill.  (Agent: If no, request that the patient contact the pharmacy for the refill. If patient does not wish to contact the pharmacy document the reason why and proceed with request.) (Agent: If yes, when and what did the pharmacy advise?)  This is the patient's preferred pharmacy:   [facility] STORE #[id number], [place] - [address] AT [facility] [address] [place] [address] Phone: [phone] Fax: [phone]  Is this the correct pharmacy for this prescription? Yes If no, delete pharmacy and type the correct one.   Has the prescription been filled recently? Yes  Is the patient out of the medication? Yes  Has the patient been seen for an appointment in the last year OR does the patient have an upcoming appointment? Yes, last seen on 12/07/23  Can we respond through MyChart? Yes  Agent: Please be advised that Rx refills may take up to 3 business days. We ask that you follow-up with your pharmacy. Patient verbalize understanding.

## 2024-01-23 NOTE — Telephone Encounter (Unsigned)
 Copied from CRM [id number]. Topic: Clinical - Medication Refill >> Jan 23, 2024  9:32 AM Clydene Darner H wrote: Medication: Linzess    Has the patient contacted their pharmacy? Yes  The patient was informed by his pharmacy that they have been trying to contact his PCP regarding this prescription refill.  (Agent: If no, request that the patient contact the pharmacy for the refill. If patient does not wish to contact the pharmacy document the reason why and proceed with request.) (Agent: If yes, when and what did the pharmacy advise?)  This is the patient's preferred pharmacy:   [facility] STORE #[id number] [facility], [place] - [address] AT [facility] [address] [place] [address] Phone: [phone] Fax: [phone]  Is this the correct pharmacy for this prescription? Yes If no, delete pharmacy and type the correct one.   Has the prescription been filled recently? Yes  Is the patient out of the medication? Yes  Has the patient been seen for an appointment in the last year OR does the patient have an upcoming appointment? Yes, last seen on 12/07/23  Can we respond through MyChart? Yes  Agent: Please be advised that Rx refills may take up to 3 business days. We ask that you follow-up with your pharmacy. Patient verbalize understanding.

## 2024-01-28 ENCOUNTER — Ambulatory Visit

## 2024-01-28 VITALS — BP 132/70 | HR 64 | Ht 73.0 in | Wt 198.0 lb

## 2024-01-28 DIAGNOSIS — Z0000 Encounter for general adult medical examination without abnormal findings: Secondary | ICD-10-CM | POA: Diagnosis not present

## 2024-01-28 NOTE — Progress Notes (Signed)
 Subjective:   Austin [name] is a 78 y.o. who presents for a Medicare Wellness preventive visit.  As a reminder, Annual Wellness Visits don't include a physical exam, and some assessments may be limited, especially if this visit is performed virtually. We may recommend an in-person follow-up visit with your provider if needed.  Visit Complete: In person  Persons Participating in Visit: Patient.  AWV Questionnaire: No: Patient Medicare AWV questionnaire was not completed prior to this visit.  Cardiac Risk Factors include: advanced age (>68men, >74 women);hypertension;male gender;dyslipidemia;Other (see comment), Risk factor comments: Benign liver cyst, Hyperplasia of prostate, Dilated aortic root     Objective:     Today's Vitals   01/28/24 1056  BP: 132/70  Pulse: 64  SpO2: 97%  Weight: 198 lb (89.8 kg)  Height: 6\' 1"  (1.854 m)   Body mass index is 26.12 kg/m.     01/28/2024   11:49 AM 10/04/2023    2:11 PM 10/13/2022    8:55 AM 10/03/2022    1:37 PM 06/15/2022    1:21 PM 10/10/2021    1:16 PM 10/10/2021   12:52 PM  Advanced Directives  Does Patient Have a Medical Advance Directive? No Yes Yes Yes Yes Yes Yes  Type of Furniture conservator/restorer;Living will Healthcare Power of [place];Living will Healthcare Power of [place];Living will Healthcare Power of [place];Living will Living will Living will  Does patient want to make changes to medical advance directive?  No - Patient declined  No - Patient declined     Copy of Healthcare Power of Attorney in Chart?  Yes - validated most recent copy scanned in chart (See row information) No - copy requested No - copy requested     Would patient like information on creating a medical advance directive? No - Patient declined          Current Medications (verified) Outpatient Encounter Medications as of 01/28/2024  Medication Sig   Alcohol Swabs (B-D SINGLE USE SWABS REGULAR) PADS 1 each by Does not apply route  as needed.   amLODipine  (NORVASC ) 10 MG tablet Take 1 tablet (10 mg total) by mouth daily.   Ascorbic Acid (VITAMIN C PO) Take 1 tablet by mouth daily.   aspirin  81 MG tablet Take 1 tablet (81 mg total) by mouth daily.   Aspirin -Caffeine (BAYER BACK & BODY) 500-32.5 MG TABS Take 1 tablet by mouth daily as needed (pain).   clopidogrel  (PLAVIX ) 75 MG tablet TAKE 1 TABLET EVERY DAY   dorzolamide -timolol  (COSOPT ) 22.3-6.8 MG/ML ophthalmic solution Place 1 drop into both eyes 2 (two) times daily.    latanoprost (XALATAN) 0.005 % ophthalmic solution Place 1 drop into both eyes at bedtime.   linaclotide  (LINZESS ) 72 MCG capsule TAKE 1 CAPSULE(72 MCG) BY MOUTH DAILY BEFORE BREAKFAST   Menthol-Methyl Salicylate (MUSCLE RUB) 10-15 % CREA Apply 1 Application topically as needed for muscle pain.   nitroGLYCERIN  (NITROSTAT ) 0.4 MG SL tablet PLACE 1 TABLET UNDER THE TONGUE AS NEEDED FOR CHEST PAIN   OVER THE COUNTER MEDICATION Take 2 tablets by mouth daily with lunch. Prostate Strong   Polyvinyl Alcohol-Povidone (REFRESH OP) Place 1 drop into both eyes daily as needed (dry eyes).   ranolazine  (RANEXA ) 500 MG 12 hr tablet Take 1 tablet (500 mg total) by mouth 2 (two) times daily.   Study - ORION 4 - inclisiran 300 mg/1.53mL or placebo SQ injection (PI-[place]uckey) Inject 1.5 mLs (300 mg total) into the skin every 6 (six)  months.   No facility-administered encounter medications on file as of 01/28/2024.    Allergies (verified) Zetia  [ezetimibe ], Crestor  [rosuvastatin ], Isosorbide , Pravachol  [pravastatin  sodium], and Silver   History: Past Medical History:  Diagnosis Date   Allergy    Arthritis    CAP (community acquired pneumonia)    Cataract    bilateral   Coronary artery disease    03/13/2016 DES to first OM, EF 55% // Myoview  12/19: EF 55, ant-lat infarction, no significant ischemia, Low Risk   Heart murmur    "outgrew it" (03/13/2016)   Hematuria    HTN (hypertension)    Hx of cardiovascular stress  test    ETT-Myoview  (10/15): Nondiagnostic EKG changes, anterolateral and inferolateral scar, no ischemia, EF 48%, low risk   Hyperlipemia    Hyperlipidemia    NMR Lipoprofile 2008: LDL 168 ( 2410/ 1826), HDL 41, TG 71. LDL goal = <100, ideally <  70. Framingham Study LDL goal = < 130.   Kidney stones    "passed them"; Dr. Milon Aloe (03/13/2016)   Myocardial infarction [facility]) 2001   Non Hodgkin's lymphoma (HCC)    [facility]   Pleural effusion    TIA (transient ischemic attack) 1980s   "I was exercising when I had it"   Past Surgical History:  Procedure Laterality Date   CARDIAC CATHETERIZATION N/A 12/22/2015   Procedure: Left Heart Cath and Coronary Angiography;  Surgeon: Arnoldo Lapping, MD;  Location: [facility] INVASIVE CV LAB;  Service: Cardiovascular;  Laterality: N/A;   CARDIAC CATHETERIZATION N/A 12/22/2015   Procedure: Coronary Stent Intervention;  Surgeon: Arnoldo Lapping, MD;  Location: [facility] INVASIVE CV LAB;  Service: Cardiovascular;  Laterality: N/A;   CARDIAC CATHETERIZATION N/A 12/22/2015   Procedure: Intravascular Pressure Wire/FFR Study;  Surgeon: Arnoldo Lapping, MD;  Location: [facility] INVASIVE CV LAB;  Service: Cardiovascular;  Laterality: N/A;   CARDIAC CATHETERIZATION N/A 03/13/2016   Procedure: Left Heart Cath and Coronary Angiography;  Surgeon: Arnoldo Lapping, MD;  Location: [facility] INVASIVE CV LAB;  Service: Cardiovascular;  Laterality: N/A;   COLONOSCOPY  2002   CORONARY ANGIOPLASTY WITH STENT PLACEMENT  ~ 2013   "1 stent"   CORONARY STENT INTERVENTION N/A 03/30/2022   Procedure: CORONARY STENT INTERVENTION;  Surgeon: Arnoldo Lapping, MD;  Location: [facility] INVASIVE CV LAB;  Service: Cardiovascular;  Laterality: N/A;   ESOPHAGOGASTRODUODENOSCOPY (EGD) WITH ESOPHAGEAL DILATION  2002   KNEE ARTHROSCOPY Left 1990s   LEFT HEART CATH AND CORONARY ANGIOGRAPHY N/A 03/30/2022   Procedure: LEFT HEART CATH AND CORONARY ANGIOGRAPHY;  Surgeon: Arnoldo Lapping, MD;  Location: [facility] INVASIVE CV LAB;   Service: Cardiovascular;  Laterality: N/A;   LEFT HEART CATH AND CORONARY ANGIOGRAPHY N/A 06/15/2022   Procedure: LEFT HEART CATH AND CORONARY ANGIOGRAPHY;  Surgeon: Arnoldo Lapping, MD;  Location: [facility] INVASIVE CV LAB;  Service: Cardiovascular;  Laterality: N/A;   Family History  Problem Relation Age of Onset   Cancer Mother        lung cancer   Diabetes Mother    Hyperlipidemia Mother    Stroke Father        age 2   Prostate cancer Father    Cancer Sister        ?lymphoma   Social History   Socioeconomic History   Marital status: Married    Spouse name: Not on file   Number of children: 3   Years of education: Not on file   Highest education level: Not on file  Occupational History   Occupation:  retired Nurse, adult  Tobacco Use   Smoking status: Never   Smokeless tobacco: Never  Vaping Use   Vaping status: Never Used  Substance and Sexual Activity   Alcohol use: No    Comment: drank some in my 20's"   Drug use: No   Sexual activity: Yes  Other Topics Concern   Not on file  Social History Narrative   Married   Social Drivers of Health   Financial Resource Strain: Low Risk  (01/28/2024)   Overall Financial Resource Strain (CARDIA)    Difficulty of Paying Living Expenses: Not hard at all  Food Insecurity: No Food Insecurity (01/28/2024)   Hunger Vital Sign    Worried About Running Out of Food in the Last Year: Never true    Ran Out of Food in the Last Year: Never true  Transportation Needs: No Transportation Needs (01/28/2024)   PRAPARE - Administrator, Civil Service (Medical): No    Lack of Transportation (Non-Medical): No  Physical Activity: Sufficiently Active (01/28/2024)   Exercise Vital Sign    Days of Exercise per Week: 3 days    Minutes of Exercise per Session: 60 min  Stress: No Stress Concern Present (01/28/2024)   [employer] of Occupational Health - Occupational Stress Questionnaire    Feeling of Stress : Not at all  Social Connections:  Moderately Integrated (01/28/2024)   Social Connection and Isolation Panel [NHANES]    Frequency of Communication with Friends and Family: More than three times a week    Frequency of Social Gatherings with Friends and Family: More than three times a week    Attends Religious Services: More than 4 times per year    Active Member of [employer] or Organizations: No    Attends Engineer, structural: Never    Marital Status: Married    Tobacco Counseling Counseling given: No    Clinical Intake:  Pre-visit preparation completed: Yes  Pain : No/denies pain     BMI - recorded: 26.12 Nutritional Status: BMI 25 -29 Overweight Nutritional Risks: None Diabetes: No  Lab Results  Component Value Date   HGBA1C 6.0 12/07/2023   HGBA1C 6.1 09/05/2022   HGBA1C 6.1 06/21/2021     How often do you need to have someone help you when you read instructions, pamphlets, or other written materials from your doctor or pharmacy?: 1 - Never  Interpreter Needed?: No  Information entered by :: Kandy Orris, CMA   Activities of Daily Living     01/28/2024   10:58 AM  In your present state of health, do you have any difficulty performing the following activities:  Hearing? 0  Vision? 0  Difficulty concentrating or making decisions? 0  Walking or climbing stairs? 0  Dressing or bathing? 0  Doing errands, shopping? 0  Preparing Food and eating ? N  Using the Toilet? N  In the past six months, have you accidently leaked urine? N  Do you have problems with loss of bowel control? N  Managing your Medications? N  Managing your Finances? N  Housekeeping or managing your Housekeeping? N    Patient Care Team: Colene Dauphin, MD as PCP - General (Internal Medicine) Arnoldo Lapping, MD as PCP - Cardiology (Cardiology) Sherwood Donath as Physician Assistant (Cardiology) Florance Hun, MD as Consulting Physician (Ophthalmology)  I have updated your Care Teams any recent Medical Services  you may have received from other providers in the past year.  Assessment:    This is a routine wellness examination for Cheo.  Hearing/Vision screen Hearing Screening - Comments:: Denies hearing difficulties   Vision Screening - Comments:: Wears rx glasses - up to date with routine eye exams with Dr Carloyn Chi   Goals Addressed               This Visit's Progress     Patient Stated (pt-stated)        Patient stated he will continue to exercise weekly       Depression Screen     01/28/2024   11:52 AM 12/07/2023    1:57 PM 04/03/2023    9:50 AM 10/13/2022    8:54 AM 10/03/2022    2:06 PM 09/05/2022    8:21 AM 02/03/2022    8:41 AM  PHQ 2/9 Scores  PHQ - 2 Score 0 0 0 0 0 0 0  PHQ- 9 Score 0  0 0  0 0    Fall Risk     12/07/2023    1:57 PM 04/03/2023    9:49 AM 10/13/2022    8:53 AM 09/05/2022    8:21 AM 02/03/2022    8:42 AM  Fall Risk   Falls in the past year? 0 0 0 0 0  Number falls in past yr: 0 0 0 0 0  Injury with Fall? 0 0 0 0 0  Risk for fall due to : No Fall Risks No Fall Risks No Fall Risks No Fall Risks No Fall Risks  Follow up Falls evaluation completed Falls evaluation completed Falls prevention discussed Falls evaluation completed Falls evaluation completed    MEDICARE RISK AT HOME:  Medicare Risk at Home Any stairs in or around the home?: No If so, are there any without handrails?: No Home free of loose throw rugs in walkways, pet beds, electrical cords, etc?: Yes Adequate lighting in your home to reduce risk of falls?: Yes Life alert?: No Use of a cane, walker or w/c?: No Grab bars in the bathroom?: No Shower chair or bench in shower?: Yes Elevated toilet seat or a handicapped toilet?: Yes  TIMED UP AND GO:  Was the test performed?  No  Cognitive Function: 6CIT completed        01/28/2024   11:55 AM 10/13/2022    8:56 AM  6CIT Screen  What Year? 0 points 0 points  What month? 0 points 0 points  What time? 0 points 0 points  Count back from 20 0  points 0 points  Months in reverse 0 points 0 points  Repeat phrase 0 points 0 points  Total Score 0 points 0 points    Immunizations Immunization History  Administered Date(s) Administered   Fluad Quad(high Dose 65+) 06/07/2019, 06/16/2020, 06/21/2021, 06/30/2022   Influenza, High Dose Seasonal PF 06/24/2018   Influenza-Unspecified 08/23/2017, 07/04/2018, 05/16/2023   PFIZER(Purple Top)SARS-COV-2 Vaccination 10/02/2019, 10/23/2019, 07/31/2020   Pneumococcal Conjugate-13 12/05/2017   Pneumococcal Polysaccharide-23 08/28/2010   Td 05/16/2006   Td (Adult), 2 Lf Tetanus Toxid, Preservative Free 05/16/2006   Zoster Recombinant(Shingrix) 07/01/2019, 11/15/2019    Screening Tests Health Maintenance  Topic Date Due   COVID-19 Vaccine (4 - 2024-25 season) 04/29/2023   DTaP/Tdap/Td (2 - Tdap) 12/06/2024 (Originally 05/16/2016)   INFLUENZA VACCINE  03/28/2024   Medicare Annual Wellness (AWV)  01/27/2025   Pneumonia Vaccine 94+ Years old  Completed   Hepatitis C Screening  Completed   Zoster Vaccines- Shingrix  Completed   HPV VACCINES  Aged  Out   Meningococcal B Vaccine  Aged Out   Colonoscopy  Discontinued    Health Maintenance  Health Maintenance Due  Topic Date Due   COVID-19 Vaccine (4 - 2024-25 season) 04/29/2023   Health Maintenance Items Addressed: 01/28/2024   Additional Screening:  Vision Screening: Recommended annual ophthalmology exams for early detection of glaucoma and other disorders of the eye.  Pt has seen Dr [facility] of [facility] earlier this year (2025).    Dental Screening: Recommended annual dental exams for proper oral hygiene  Community Resource Referral / Chronic Care Management: CRR required this visit?  No   CCM required this visit?  No   Plan:    I have personally reviewed and noted the following in the patient's chart:   Medical and social history Use of alcohol, tobacco or illicit drugs  Current medications and supplements including  opioid prescriptions. Patient is not currently taking opioid prescriptions. Functional ability and status Nutritional status Physical activity Advanced directives List of other physicians Hospitalizations, surgeries, and ER visits in previous 12 months Vitals Screenings to include cognitive, depression, and falls Referrals and appointments  In addition, I have reviewed and discussed with patient certain preventive protocols, quality metrics, and best practice recommendations. A written personalized care plan for preventive services as well as general preventive health recommendations were provided to patient.   Patria Bookbinder, CMA   01/28/2024   After Visit Summary: (MyChart) Due to this being a telephonic visit, the after visit summary with patients personalized plan was offered to patient via MyChart   Notes: Nothing significant to report at this time.

## 2024-01-28 NOTE — Progress Notes (Signed)
 OFFICE NOTE Date:  01/29/2024  ID:  Austin [name], DOB July 20, 1946, MRN [id number] PCP: Colene Dauphin, MD  [facility] Providers Cardiologist:  Arnoldo Lapping, MD Cardiology APP:  Gabino Joe, PA-C     Patient Profile:     Coronary artery disease S/p Lat MI in 2012 tx with BMS to LCx PCI 4/17: DES to prox LAD PCI 7/17: DES to OM1 Myoview  12/19: ant-lat infarct, no ischemia PCI 03/2022: s/p 3 x 16 mm DES to OM1 Cath 06/15/22: LAD stent patent; apical LAD 80-90 (med Rx), small D1, D2 90 (med Rx); OM1 stent patent; mLCx 100 (CTO); pRCA 50, dRCA 50, oRPDA 80; multiple PDA/PLA branches w severe dz - Med Rx Lexiscan  MPI 11/06/2023: Anterolateral, inferolateral and inferoapical scar, EF 54, no ischemia, intermediate risk Follicular lymphoma, Gr I-II (Dr. Barbee Lew, [facility]) Hypertension Hyperlipidemia ORION 4 Trial participant Hx of TIA Aortic atherosclerosis       Discussed the use of AI scribe software for clinical note transcription with the patient, who gave verbal consent to proceed History of Present Illness Austin [name] is a 78 y.o. male who returns for follow-up of CAD.  He was last seen 11/01/2023 by Lovette Rud, PA-C.  He noted chest discomfort exacerbated by activity and relieved by rest.  He was set up for nuclear stress test which was intermediate risk.  It demonstrated anterolateral, inferolateral inferoapical scar but no ischemia.  He is here alone. He currently feels 'pretty good' and has been exercising more, which he believes has alleviated his chest symptoms. No current chest pain, shortness of breath, dizziness, or syncope. He monitors his blood pressure at home, which is typically normal, but noted it was elevated today as he had not taken his medication. His blood pressure was 130/70 yesterday at another appointment. He continues to participate in the Orion-4 study and receives inclisiran every six months. He is unsure about the duration of his  participation in the study and plans to ask more questions at his next visit. He has been exercising more, including using a bike and treadmill at the gym, and reports no chest symptoms during these activities. He has adjusted his diet, reducing intake of [place] fries and red meat, and increasing fish consumption.  ROS-See HPI    Studies Reviewed:      Labs-chart review 12/07/23: K 4.7, creatinine 1.36, ALT 16, hemoglobin 13.4  Risk Assessment/Calculations: HYPERTENSION CONTROL Vitals:   01/29/24 0859 01/29/24 0930  BP: (!) 159/75 (!) 146/76    The patient's blood pressure is elevated above target today.  In order to address the patient's elevated BP: Blood pressure will be monitored at home to determine if medication changes need to be made.         Physical Exam:  VS:  BP (!) 146/76   Pulse (!) 56   Ht 6\' 1"  (1.854 m)   Wt 195 lb 12.8 oz (88.8 kg)   SpO2 95%   BMI 25.83 kg/m    Wt Readings from Last 3 Encounters:  01/29/24 195 lb 12.8 oz (88.8 kg)  01/28/24 198 lb (89.8 kg)  12/07/23 190 lb (86.2 kg)    Constitutional:      Appearance: Healthy appearance. Not in distress.  Neck:     Vascular: JVD normal.  Pulmonary:     Breath sounds: Normal breath sounds. No wheezing. No rales.  Cardiovascular:     Normal rate. Regular rhythm.     Murmurs: There is no  murmur.  Edema:    Peripheral edema absent.  Abdominal:     Palpations: Abdomen is soft.      Assessment and Plan: Assessment & Plan Coronary artery disease involving native coronary artery of native heart with angina pectoris (HCC) History of myocardial infarction 2012 treated a BMS to the LCx and multiple PCI procedures since.  He has undergone stenting to the OM1, proximal LAD. Last PCI was in Aug 2023 with DES to the OM1 at the distal stent edge.  He also has residual severe small vessel disease in the diagonal branches and apical LAD as well as diffuse disease of the RCA involving multiple bifurcation areas in  the distal branch vessels, not suitable for PCI.  Cardiac catheterization in 05/2022 demonstrated stable anatomy with patent stents and continued small vessel and branch vessel disease. Recent nuclear stress test showed anterolateral and apical scar but no new ischemia. Currently asymptomatic with no chest discomfort or dyspnea.  He is engaging in regular exercise and has tried to improve his diet. - Continue amlodipine  10 mg daily - Continue aspirin  81 mg daily - Continue clopidogrel  75 mg daily - Continue nitroglycerin  as needed - Continue ranolazine  500 mg twice daily - Follow-up 1 year or sooner if needed Essential hypertension Blood pressure was elevated today due to missed medication dose. Typically, blood pressures at home are optimal. Goal is to maintain blood pressure less than 130/80 mmHg. - Continue amlodipine  10 mg daily - Monitor blood pressures at home - Consider ACE inhibitor or ARB if blood pressures exceed goal Mixed hyperlipidemia Participating in [place] study with inclisiran every six months.  - Continue participation in [place] study      Dispo:  Return in about 1 year (around 01/28/2025) for Routine Follow Up, w/ Dr. Arlester Ladd. Signed, Marlyse Single, PA-C

## 2024-01-28 NOTE — Patient Instructions (Signed)
 Austin [name] , Thank you for taking time out of your busy schedule to complete your Annual Wellness Visit with me. I enjoyed our conversation and look forward to speaking with you again next year. I, as well as your care team,  appreciate your ongoing commitment to your health goals. Please review the following plan we discussed and let me know if I can assist you in the future. Your Game plan/ To Do List   Follow up Visits: Next Medicare AWV with our clinical staff: 01/30/2025   Have you seen your provider in the last 6 months (3 months if uncontrolled diabetes)? Yes Next Office Visit with your provider:   Clinician Recommendations:  Aim for 30 minutes of exercise or brisk walking, 6-8 glasses of water, and 5 servings of fruits and vegetables each day. Educated and advised on getting the Tdap (Tetenus) vaccines in 2025 at local pharmacy.      This is a list of the screening recommended for you and due dates:  Health Maintenance  Topic Date Due   COVID-19 Vaccine (4 - 2024-25 season) 04/29/2023   DTaP/Tdap/Td vaccine (2 - Tdap) 12/06/2024*   Flu Shot  03/28/2024   Medicare Annual Wellness Visit  01/27/2025   Pneumonia Vaccine  Completed   Hepatitis C Screening  Completed   Zoster (Shingles) Vaccine  Completed   HPV Vaccine  Aged Out   Meningitis B Vaccine  Aged Out   Colon Cancer Screening  Discontinued  *Topic was postponed. The date shown is not the original due date.    Advanced directives: (Declined) Advance directive discussed with you today. Even though you declined this today, please call our office should you change your mind, and we can give you the proper paperwork for you to fill out. Advance Care Planning is important because it:  [x]  Makes sure you receive the medical care that is consistent with your values, goals, and preferences  [x]  It provides guidance to your family and loved ones and reduces their decisional burden about whether or not they are making the right  decisions based on your wishes.  Follow the link provided in your after visit summary or read over the paperwork we have mailed to you to help you started getting your Advance Directives in place. If you need assistance in completing these, please reach out to us  so that we can help you!

## 2024-01-29 ENCOUNTER — Encounter: Payer: Self-pay | Admitting: Physician Assistant

## 2024-01-29 ENCOUNTER — Ambulatory Visit: Attending: Physician Assistant | Admitting: Physician Assistant

## 2024-01-29 VITALS — BP 146/76 | HR 56 | Ht 73.0 in | Wt 195.8 lb

## 2024-01-29 DIAGNOSIS — I25119 Atherosclerotic heart disease of native coronary artery with unspecified angina pectoris: Secondary | ICD-10-CM

## 2024-01-29 DIAGNOSIS — E782 Mixed hyperlipidemia: Secondary | ICD-10-CM

## 2024-01-29 DIAGNOSIS — I10 Essential (primary) hypertension: Secondary | ICD-10-CM

## 2024-01-29 NOTE — Assessment & Plan Note (Signed)
 Participating in [place] study with inclisiran every six months.  - Continue participation in [place] study

## 2024-01-29 NOTE — Patient Instructions (Signed)
 Medication Instructions:  Your physician recommends that you continue on your current medications as directed. Please refer to the Current Medication list given to you today.  *If you need a refill on your cardiac medications before your next appointment, please call your pharmacy*  Lab Work: NONE If you have labs (blood work) drawn today and your tests are completely normal, you will receive your results only by: MyChart Message (if you have MyChart) OR A paper copy in the mail If you have any lab test that is abnormal or we need to change your treatment, we will call you to review the results.  Testing/Procedures: NONE  Follow-Up: At [facility], you and your health needs are our priority.  As part of our continuing mission to provide you with exceptional heart care, our providers are all part of one team.  This team includes your primary Cardiologist (physician) and Advanced Practice Providers or APPs (Physician Assistants and Nurse Practitioners) who all work together to provide you with the care you need, when you need it.  Your next appointment:   1 year(s)  Provider:   Arnoldo Lapping, MD    We recommend signing up for the patient portal called "MyChart".  Sign up information is provided on this After Visit Summary.  MyChart is used to connect with patients for Virtual Visits (Telemedicine).  Patients are able to view lab/test results, encounter notes, upcoming appointments, etc.  Non-urgent messages can be sent to your provider as well.   To learn more about what you can do with MyChart, go to [email or web address].

## 2024-01-29 NOTE — Assessment & Plan Note (Signed)
 History of myocardial infarction 2012 treated a BMS to the LCx and multiple PCI procedures since.  He has undergone stenting to the OM1, proximal LAD. Last PCI was in Aug 2023 with DES to the OM1 at the distal stent edge.  He also has residual severe small vessel disease in the diagonal branches and apical LAD as well as diffuse disease of the RCA involving multiple bifurcation areas in the distal branch vessels, not suitable for PCI.  Cardiac catheterization in 05/2022 demonstrated stable anatomy with patent stents and continued small vessel and branch vessel disease. Recent nuclear stress test showed anterolateral and apical scar but no new ischemia. Currently asymptomatic with no chest discomfort or dyspnea.  He is engaging in regular exercise and has tried to improve his diet. - Continue amlodipine  10 mg daily - Continue aspirin  81 mg daily - Continue clopidogrel  75 mg daily - Continue nitroglycerin  as needed - Continue ranolazine  500 mg twice daily - Follow-up 1 year or sooner if needed

## 2024-01-29 NOTE — Assessment & Plan Note (Signed)
 Blood pressure was elevated today due to missed medication dose. Typically, blood pressures at home are optimal. Goal is to maintain blood pressure less than 130/80 mmHg. - Continue amlodipine  10 mg daily - Monitor blood pressures at home - Consider ACE inhibitor or ARB if blood pressures exceed goal

## 2024-01-30 NOTE — Progress Notes (Signed)
 Subjective:   Austin [name] is a 78 y.o. who presents for a Medicare Wellness preventive visit.  As a reminder, Annual Wellness Visits don't include a physical exam, and some assessments may be limited, especially if this visit is performed virtually. We may recommend an in-person follow-up visit with your provider if needed.  Visit Complete: In person  Persons Participating in Visit: Patient.  AWV Questionnaire: No: Patient Medicare AWV questionnaire was not completed prior to this visit.  Cardiac Risk Factors include: advanced age (>42men, >72 women);hypertension;male gender;dyslipidemia;Other (see comment), Risk factor comments: Benign liver cyst, Hyperplasia of prostate, Dilated aortic root     Objective:     Today's Vitals   01/28/24 1056  BP: 132/70  Pulse: 64  SpO2: 97%  Weight: 198 lb (89.8 kg)  Height: 6\' 1"  (1.854 m)   Body mass index is 26.12 kg/m.     01/28/2024   11:49 AM 10/04/2023    2:11 PM 10/13/2022    8:55 AM 10/03/2022    1:37 PM 06/15/2022    1:21 PM 10/10/2021    1:16 PM 10/10/2021   12:52 PM  Advanced Directives  Does Patient Have a Medical Advance Directive? No Yes Yes Yes Yes Yes Yes  Type of Furniture conservator/restorer;Living will Healthcare Power of [place];Living will Healthcare Power of [place];Living will Healthcare Power of [place];Living will Living will Living will  Does patient want to make changes to medical advance directive?  No - Patient declined  No - Patient declined     Copy of Healthcare Power of Attorney in Chart?  Yes - validated most recent copy scanned in chart (See row information) No - copy requested No - copy requested     Would patient like information on creating a medical advance directive? No - Patient declined          Current Medications (verified) Outpatient Encounter Medications as of 01/28/2024  Medication Sig   Alcohol Swabs (B-D SINGLE USE SWABS REGULAR) PADS 1 each by Does not apply route  as needed.   amLODipine  (NORVASC ) 10 MG tablet Take 1 tablet (10 mg total) by mouth daily.   Ascorbic Acid (VITAMIN C PO) Take 1 tablet by mouth daily.   aspirin  81 MG tablet Take 1 tablet (81 mg total) by mouth daily.   Aspirin -Caffeine (BAYER BACK & BODY) 500-32.5 MG TABS Take 1 tablet by mouth daily as needed (pain).   clopidogrel  (PLAVIX ) 75 MG tablet TAKE 1 TABLET EVERY DAY   dorzolamide -timolol  (COSOPT ) 22.3-6.8 MG/ML ophthalmic solution Place 1 drop into both eyes 2 (two) times daily.    latanoprost (XALATAN) 0.005 % ophthalmic solution Place 1 drop into both eyes at bedtime.   linaclotide  (LINZESS ) 72 MCG capsule TAKE 1 CAPSULE(72 MCG) BY MOUTH DAILY BEFORE BREAKFAST   Menthol-Methyl Salicylate (MUSCLE RUB) 10-15 % CREA Apply 1 Application topically as needed for muscle pain.   nitroGLYCERIN  (NITROSTAT ) 0.4 MG SL tablet PLACE 1 TABLET UNDER THE TONGUE AS NEEDED FOR CHEST PAIN   OVER THE COUNTER MEDICATION Take 2 tablets by mouth daily with lunch. Prostate Strong   Polyvinyl Alcohol-Povidone (REFRESH OP) Place 1 drop into both eyes daily as needed (dry eyes).   ranolazine  (RANEXA ) 500 MG 12 hr tablet Take 1 tablet (500 mg total) by mouth 2 (two) times daily.   Study - ORION 4 - inclisiran 300 mg/1.29mL or placebo SQ injection (PI-Stuckey) Inject 1.5 mLs (300 mg total) into the skin every 6 (six)  months.   No facility-administered encounter medications on file as of 01/28/2024.    Allergies (verified) Zetia  [ezetimibe ], Crestor  [rosuvastatin ], Isosorbide , Pravachol  [pravastatin  sodium], and Silver   History: Past Medical History:  Diagnosis Date   Allergy    Arthritis    CAP (community acquired pneumonia)    Cataract    bilateral   Coronary artery disease    03/13/2016 DES to first OM, EF 55% // Myoview  12/19: EF 55, ant-lat infarction, no significant ischemia, Low Risk   Heart murmur    "outgrew it" (03/13/2016)   Hematuria    HTN (hypertension)    Hx of cardiovascular stress  test    ETT-Myoview  (10/15): Nondiagnostic EKG changes, anterolateral and inferolateral scar, no ischemia, EF 48%, low risk   Hyperlipemia    Hyperlipidemia    NMR Lipoprofile 2008: LDL 168 ( 2410/ 1826), HDL 41, TG 71. LDL goal = <100, ideally <  70. Framingham Study LDL goal = < 130.   Kidney stones    "passed them"; Dr. Milon Aloe (03/13/2016)   Myocardial infarction [facility]) 2001   Non Hodgkin's lymphoma [facility]   Pleural effusion    TIA (transient ischemic attack) 1980s   "I was exercising when I had it"   Past Surgical History:  Procedure Laterality Date   CARDIAC CATHETERIZATION N/A 12/22/2015   Procedure: Left Heart Cath and Coronary Angiography;  Surgeon: Arnoldo Lapping, MD;  Location: [facility] INVASIVE CV LAB;  Service: Cardiovascular;  Laterality: N/A;   CARDIAC CATHETERIZATION N/A 12/22/2015   Procedure: Coronary Stent Intervention;  Surgeon: Arnoldo Lapping, MD;  Location: [facility] INVASIVE CV LAB;  Service: Cardiovascular;  Laterality: N/A;   CARDIAC CATHETERIZATION N/A 12/22/2015   Procedure: Intravascular Pressure Wire/FFR Study;  Surgeon: Arnoldo Lapping, MD;  Location: [facility] INVASIVE CV LAB;  Service: Cardiovascular;  Laterality: N/A;   CARDIAC CATHETERIZATION N/A 03/13/2016   Procedure: Left Heart Cath and Coronary Angiography;  Surgeon: Arnoldo Lapping, MD;  Location: [facility] INVASIVE CV LAB;  Service: Cardiovascular;  Laterality: N/A;   COLONOSCOPY  2002   CORONARY ANGIOPLASTY WITH STENT PLACEMENT  ~ 2013   "1 stent"   CORONARY STENT INTERVENTION N/A 03/30/2022   Procedure: CORONARY STENT INTERVENTION;  Surgeon: Arnoldo Lapping, MD;  Location: [facility] INVASIVE CV LAB;  Service: Cardiovascular;  Laterality: N/A;   ESOPHAGOGASTRODUODENOSCOPY (EGD) WITH ESOPHAGEAL DILATION  2002   KNEE ARTHROSCOPY Left 1990s   LEFT HEART CATH AND CORONARY ANGIOGRAPHY N/A 03/30/2022   Procedure: LEFT HEART CATH AND CORONARY ANGIOGRAPHY;  Surgeon: Arnoldo Lapping, MD;  Location: [facility] INVASIVE CV LAB;   Service: Cardiovascular;  Laterality: N/A;   LEFT HEART CATH AND CORONARY ANGIOGRAPHY N/A 06/15/2022   Procedure: LEFT HEART CATH AND CORONARY ANGIOGRAPHY;  Surgeon: Arnoldo Lapping, MD;  Location: [facility] INVASIVE CV LAB;  Service: Cardiovascular;  Laterality: N/A;   Family History  Problem Relation Age of Onset   Cancer Mother        lung cancer   Diabetes Mother    Hyperlipidemia Mother    Stroke Father        age 37   Prostate cancer Father    Cancer Sister        ?lymphoma   Social History   Socioeconomic History   Marital status: Married    Spouse name: Not on file   Number of children: 3   Years of education: Not on file   Highest education level: Not on file  Occupational History   Occupation:  retired Nurse, adult  Tobacco Use   Smoking status: Never   Smokeless tobacco: Never  Vaping Use   Vaping status: Never Used  Substance and Sexual Activity   Alcohol use: No    Comment: drank some in my 20's"   Drug use: No   Sexual activity: Yes  Other Topics Concern   Not on file  Social History Narrative   Married   Social Drivers of Health   Financial Resource Strain: Low Risk  (01/28/2024)   Overall Financial Resource Strain (CARDIA)    Difficulty of Paying Living Expenses: Not hard at all  Food Insecurity: No Food Insecurity (01/28/2024)   Hunger Vital Sign    Worried About Running Out of Food in the Last Year: Never true    Ran Out of Food in the Last Year: Never true  Transportation Needs: No Transportation Needs (01/28/2024)   PRAPARE - Administrator, Civil Service (Medical): No    Lack of Transportation (Non-Medical): No  Physical Activity: Sufficiently Active (01/28/2024)   Exercise Vital Sign    Days of Exercise per Week: 3 days    Minutes of Exercise per Session: 60 min  Stress: No Stress Concern Present (01/28/2024)   [employer] of Occupational Health - Occupational Stress Questionnaire    Feeling of Stress : Not at all  Social Connections:  Moderately Integrated (01/28/2024)   Social Connection and Isolation Panel [NHANES]    Frequency of Communication with Friends and Family: More than three times a week    Frequency of Social Gatherings with Friends and Family: More than three times a week    Attends Religious Services: More than 4 times per year    Active Member of [employer] or Organizations: No    Attends Engineer, structural: Never    Marital Status: Married    Tobacco Counseling Counseling given: No    Clinical Intake:  Pre-visit preparation completed: Yes  Pain : No/denies pain     BMI - recorded: 26.12 Nutritional Status: BMI 25 -29 Overweight Nutritional Risks: None Diabetes: No  Lab Results  Component Value Date   HGBA1C 6.0 12/07/2023   HGBA1C 6.1 09/05/2022   HGBA1C 6.1 06/21/2021     How often do you need to have someone help you when you read instructions, pamphlets, or other written materials from your doctor or pharmacy?: 1 - Never  Interpreter Needed?: No  Information entered by :: Kandy Orris, CMA   Activities of Daily Living     01/28/2024   10:58 AM  In your present state of health, do you have any difficulty performing the following activities:  Hearing? 0  Vision? 0  Difficulty concentrating or making decisions? 0  Walking or climbing stairs? 0  Dressing or bathing? 0  Doing errands, shopping? 0  Preparing Food and eating ? N  Using the Toilet? N  In the past six months, have you accidently leaked urine? N  Do you have problems with loss of bowel control? N  Managing your Medications? N  Managing your Finances? N  Housekeeping or managing your Housekeeping? N    Patient Care Team: Colene Dauphin, MD as PCP - General (Internal Medicine) Arnoldo Lapping, MD as PCP - Cardiology (Cardiology) Sherwood Donath as Physician Assistant (Cardiology) Florance Hun, MD as Consulting Physician (Ophthalmology)  I have updated your Care Teams any recent Medical Services  you may have received from other providers in the past year.  Assessment:    This is a routine wellness examination for Austin [name].  Hearing/Vision screen Hearing Screening - Comments:: Denies hearing difficulties   Vision Screening - Comments:: Wears rx glasses - up to date with routine eye exams with Dr Carloyn Chi   Goals Addressed               This Visit's Progress     Patient Stated (pt-stated)        Patient stated he will continue to exercise weekly       Depression Screen     01/28/2024   11:52 AM 12/07/2023    1:57 PM 04/03/2023    9:50 AM 10/13/2022    8:54 AM 10/03/2022    2:06 PM 09/05/2022    8:21 AM 02/03/2022    8:41 AM  PHQ 2/9 Scores  PHQ - 2 Score 0 0 0 0 0 0 0  PHQ- 9 Score 0  0 0  0 0    Fall Risk     01/30/2024    7:54 AM 12/07/2023    1:57 PM 04/03/2023    9:49 AM 10/13/2022    8:53 AM 09/05/2022    8:21 AM  Fall Risk   Falls in the past year? 0 0 0 0 0  Number falls in past yr: 0 0 0 0 0  Injury with Fall? 0 0 0 0 0  Risk for fall due to : No Fall Risks No Fall Risks No Fall Risks No Fall Risks No Fall Risks  Follow up Falls evaluation completed;Falls prevention discussed Falls evaluation completed Falls evaluation completed Falls prevention discussed Falls evaluation completed    MEDICARE RISK AT HOME:  Medicare Risk at Home Any stairs in or around the home?: No If so, are there any without handrails?: No Home free of loose throw rugs in walkways, pet beds, electrical cords, etc?: Yes Adequate lighting in your home to reduce risk of falls?: Yes Life alert?: No Use of a cane, walker or w/c?: No Grab bars in the bathroom?: No Shower chair or bench in shower?: Yes Elevated toilet seat or a handicapped toilet?: Yes  TIMED UP AND GO:  Was the test performed?  No  Cognitive Function: 6CIT completed        01/28/2024   11:55 AM 10/13/2022    8:56 AM  6CIT Screen  What Year? 0 points 0 points  What month? 0 points 0 points  What time? 0 points 0  points  Count back from 20 0 points 0 points  Months in reverse 0 points 0 points  Repeat phrase 0 points 0 points  Total Score 0 points 0 points    Immunizations Immunization History  Administered Date(s) Administered   Fluad Quad(high Dose 65+) 06/07/2019, 06/16/2020, 06/21/2021, 06/30/2022   Influenza, High Dose Seasonal PF 06/24/2018   Influenza-Unspecified 08/23/2017, 07/04/2018, 05/16/2023   PFIZER(Purple Top)SARS-COV-2 Vaccination 10/02/2019, 10/23/2019, 07/31/2020   Pneumococcal Conjugate-13 12/05/2017   Pneumococcal Polysaccharide-23 08/28/2010   Td 05/16/2006   Td (Adult), 2 Lf Tetanus Toxid, Preservative Free 05/16/2006   Zoster Recombinant(Shingrix) 07/01/2019, 11/15/2019    Screening Tests Health Maintenance  Topic Date Due   COVID-19 Vaccine (4 - 2024-25 season) 04/29/2023   DTaP/Tdap/Td (2 - Tdap) 12/06/2024 (Originally 05/16/2016)   INFLUENZA VACCINE  03/28/2024   Medicare Annual Wellness (AWV)  01/27/2025   Pneumonia Vaccine 54+ Years old  Completed   Hepatitis C Screening  Completed   Zoster Vaccines- Shingrix  Completed   HPV VACCINES  Aged Out   Meningococcal B Vaccine  Aged Out   Colonoscopy  Discontinued    Health Maintenance  Health Maintenance Due  Topic Date Due   COVID-19 Vaccine (4 - 2024-25 season) 04/29/2023   Health Maintenance Items Addressed: 01/28/2024   Additional Screening:  Vision Screening: Recommended annual ophthalmology exams for early detection of glaucoma and other disorders of the eye.  Pt has seen Dr Carl[facility]n [facility] earlier this year (2025).    Dental Screening: Recommended annual dental exams for proper oral hygiene  Community Resource Referral / Chronic Care Management: CRR required this visit?  No   CCM required this visit?  No   Plan:    I have personally reviewed and noted the following in the patient's chart:   Medical and social history Use of alcohol, tobacco or illicit drugs  Current medications  and supplements including opioid prescriptions. Patient is not currently taking opioid prescriptions. Functional ability and status Nutritional status Physical activity Advanced directives List of other physicians Hospitalizations, surgeries, and ER visits in previous 12 months Vitals Screenings to include cognitive, depression, and falls Referrals and appointments  In addition, I have reviewed and discussed with patient certain preventive protocols, quality metrics, and best practice recommendations. A written personalized care plan for preventive services as well as general preventive health recommendations were provided to patient.   Patria Bookbinder, CMA   01/30/2024   After Visit Summary: (MyChart) Due to this being a telephonic visit, the after visit summary with patients personalized plan was offered to patient via MyChart   Notes: Nothing significant to report at this time.

## 2024-02-01 NOTE — Progress Notes (Signed)
 Subjective:   Austin [name] is a 78 y.o. who presents for a Medicare Wellness preventive visit.  As a reminder, Annual Wellness Visits don't include a physical exam, and some assessments may be limited, especially if this visit is performed virtually. We may recommend an in-person follow-up visit with your provider if needed.  Visit Complete: In person  Persons Participating in Visit: Patient.  AWV Questionnaire: No: Patient Medicare AWV questionnaire was not completed prior to this visit.  Cardiac Risk Factors include: advanced age (>47men, >19 women);hypertension;male gender;dyslipidemia;Other (see comment), Risk factor comments: Benign liver cyst, Hyperplasia of prostate, Dilated aortic root     Objective:     Today's Vitals   01/28/24 1056  BP: 132/70  Pulse: 64  SpO2: 97%  Weight: 198 lb (89.8 kg)  Height: 6\' 1"  (1.854 m)   Body mass index is 26.12 kg/m.     01/28/2024   11:49 AM 10/04/2023    2:11 PM 10/13/2022    8:55 AM 10/03/2022    1:37 PM 06/15/2022    1:21 PM 10/10/2021    1:16 PM 10/10/2021   12:52 PM  Advanced Directives  Does Patient Have a Medical Advance Directive? No Yes Yes Yes Yes Yes Yes  Type of Furniture conservator/restorer;Living will Healthcare Power of [place];Living will Healthcare Power of [place];Living will Healthcare Power of [place];Living will Living will Living will  Does patient want to make changes to medical advance directive?  No - Patient declined  No - Patient declined     Copy of Healthcare Power of Attorney in Chart?  Yes - validated most recent copy scanned in chart (See row information) No - copy requested No - copy requested     Would patient like information on creating a medical advance directive? No - Patient declined          Current Medications (verified) Outpatient Encounter Medications as of 01/28/2024  Medication Sig   Alcohol Swabs (B-D SINGLE USE SWABS REGULAR) PADS 1 each by Does not apply route  as needed.   amLODipine  (NORVASC ) 10 MG tablet Take 1 tablet (10 mg total) by mouth daily.   Ascorbic Acid (VITAMIN C PO) Take 1 tablet by mouth daily.   aspirin  81 MG tablet Take 1 tablet (81 mg total) by mouth daily.   Aspirin -Caffeine (BAYER BACK & BODY) 500-32.5 MG TABS Take 1 tablet by mouth daily as needed (pain).   clopidogrel  (PLAVIX ) 75 MG tablet TAKE 1 TABLET EVERY DAY   dorzolamide -timolol  (COSOPT ) 22.3-6.8 MG/ML ophthalmic solution Place 1 drop into both eyes 2 (two) times daily.    latanoprost (XALATAN) 0.005 % ophthalmic solution Place 1 drop into both eyes at bedtime.   linaclotide  (LINZESS ) 72 MCG capsule TAKE 1 CAPSULE(72 MCG) BY MOUTH DAILY BEFORE BREAKFAST   Menthol-Methyl Salicylate (MUSCLE RUB) 10-15 % CREA Apply 1 Application topically as needed for muscle pain.   nitroGLYCERIN  (NITROSTAT ) 0.4 MG SL tablet PLACE 1 TABLET UNDER THE TONGUE AS NEEDED FOR CHEST PAIN   OVER THE COUNTER MEDICATION Take 2 tablets by mouth daily with lunch. Prostate Strong   Polyvinyl Alcohol-Povidone (REFRESH OP) Place 1 drop into both eyes daily as needed (dry eyes).   ranolazine  (RANEXA ) 500 MG 12 hr tablet Take 1 tablet (500 mg total) by mouth 2 (two) times daily.   Study - ORION 4 - inclisiran 300 mg/1.32mL or placebo SQ injection (PI-Stuckey) Inject 1.5 mLs (300 mg total) into the skin every 6 (six)  months.   No facility-administered encounter medications on file as of 01/28/2024.    Allergies (verified) Zetia  [ezetimibe ], Crestor  [rosuvastatin ], Isosorbide , Pravachol  [pravastatin  sodium], and Silver   History: Past Medical History:  Diagnosis Date   Allergy    Arthritis    CAP (community acquired pneumonia)    Cataract    bilateral   Coronary artery disease    03/13/2016 DES to first OM, EF 55% // Myoview  12/19: EF 55, ant-lat infarction, no significant ischemia, Low Risk   Heart murmur    "outgrew it" (03/13/2016)   Hematuria    HTN (hypertension)    Hx of cardiovascular stress  test    ETT-Myoview  (10/15): Nondiagnostic EKG changes, anterolateral and inferolateral scar, no ischemia, EF 48%, low risk   Hyperlipemia    Hyperlipidemia    NMR Lipoprofile 2008: LDL 168 ( 2410/ 1826), HDL 41, TG 71. LDL goal = <100, ideally <  70. Framingham Study LDL goal = < 130.   Kidney stones    "passed them"; Dr. Milon Aloe (03/13/2016)   Myocardial infarction [facility]) 2001   Non Hodgkin's lymphoma (HCC)    [facility]   Pleural effusion    TIA (transient ischemic attack) 1980s   "I was exercising when I had it"   Past Surgical History:  Procedure Laterality Date   CARDIAC CATHETERIZATION N/A 12/22/2015   Procedure: Left Heart Cath and Coronary Angiography;  Surgeon: Arnoldo Lapping, MD;  Location: [facility] INVASIVE CV LAB;  Service: Cardiovascular;  Laterality: N/A;   CARDIAC CATHETERIZATION N/A 12/22/2015   Procedure: Coronary Stent Intervention;  Surgeon: Arnoldo Lapping, MD;  Location: [facility] INVASIVE CV LAB;  Service: Cardiovascular;  Laterality: N/A;   CARDIAC CATHETERIZATION N/A 12/22/2015   Procedure: Intravascular Pressure Wire/FFR Study;  Surgeon: Arnoldo Lapping, MD;  Location: [facility] INVASIVE CV LAB;  Service: Cardiovascular;  Laterality: N/A;   CARDIAC CATHETERIZATION N/A 03/13/2016   Procedure: Left Heart Cath and Coronary Angiography;  Surgeon: Arnoldo Lapping, MD;  Location: [facility] INVASIVE CV LAB;  Service: Cardiovascular;  Laterality: N/A;   COLONOSCOPY  2002   CORONARY ANGIOPLASTY WITH STENT PLACEMENT  ~ 2013   "1 stent"   CORONARY STENT INTERVENTION N/A 03/30/2022   Procedure: CORONARY STENT INTERVENTION;  Surgeon: Arnoldo Lapping, MD;  Location: [facility] INVASIVE CV LAB;  Service: Cardiovascular;  Laterality: N/A;   ESOPHAGOGASTRODUODENOSCOPY (EGD) WITH ESOPHAGEAL DILATION  2002   KNEE ARTHROSCOPY Left 1990s   LEFT HEART CATH AND CORONARY ANGIOGRAPHY N/A 03/30/2022   Procedure: LEFT HEART CATH AND CORONARY ANGIOGRAPHY;  Surgeon: Arnoldo Lapping, MD;  Location: [facility] INVASIVE CV LAB;   Service: Cardiovascular;  Laterality: N/A;   LEFT HEART CATH AND CORONARY ANGIOGRAPHY N/A 06/15/2022   Procedure: LEFT HEART CATH AND CORONARY ANGIOGRAPHY;  Surgeon: Arnoldo Lapping, MD;  Location: [facility] INVASIVE CV LAB;  Service: Cardiovascular;  Laterality: N/A;   Family History  Problem Relation Age of Onset   Cancer Mother        lung cancer   Diabetes Mother    Hyperlipidemia Mother    Stroke Father        age 47   Prostate cancer Father    Cancer Sister        ?lymphoma   Social History   Socioeconomic History   Marital status: Married    Spouse name: Not on file   Number of children: 3   Years of education: Not on file   Highest education level: Not on file  Occupational History   Occupation:  retired Nurse, adult  Tobacco Use   Smoking status: Never   Smokeless tobacco: Never  Vaping Use   Vaping status: Never Used  Substance and Sexual Activity   Alcohol use: No    Comment: drank some in my 20's"   Drug use: No   Sexual activity: Yes  Other Topics Concern   Not on file  Social History Narrative   Married   Social Drivers of Health   Financial Resource Strain: Low Risk  (01/28/2024)   Overall Financial Resource Strain (CARDIA)    Difficulty of Paying Living Expenses: Not hard at all  Food Insecurity: No Food Insecurity (01/28/2024)   Hunger Vital Sign    Worried About Running Out of Food in the Last Year: Never true    Ran Out of Food in the Last Year: Never true  Transportation Needs: No Transportation Needs (01/28/2024)   PRAPARE - Administrator, Civil Service (Medical): No    Lack of Transportation (Non-Medical): No  Physical Activity: Sufficiently Active (01/28/2024)   Exercise Vital Sign    Days of Exercise per Week: 3 days    Minutes of Exercise per Session: 60 min  Stress: No Stress Concern Present (01/28/2024)   [employer] of Occupational Health - Occupational Stress Questionnaire    Feeling of Stress : Not at all  Social Connections:  Moderately Integrated (01/28/2024)   Social Connection and Isolation Panel [NHANES]    Frequency of Communication with Friends and Family: More than three times a week    Frequency of Social Gatherings with Friends and Family: More than three times a week    Attends Religious Services: More than 4 times per year    Active Member of [employer] or Organizations: No    Attends Engineer, structural: Never    Marital Status: Married    Tobacco Counseling Counseling given: No    Clinical Intake:  Pre-visit preparation completed: Yes  Pain : No/denies pain     BMI - recorded: 26.12 Nutritional Status: BMI 25 -29 Overweight Nutritional Risks: None Diabetes: No  Lab Results  Component Value Date   HGBA1C 6.0 12/07/2023   HGBA1C 6.1 09/05/2022   HGBA1C 6.1 06/21/2021     How often do you need to have someone help you when you read instructions, pamphlets, or other written materials from your doctor or pharmacy?: 1 - Never  Interpreter Needed?: No  Information entered by :: Kandy Orris, CMA   Activities of Daily Living     01/28/2024   10:58 AM  In your present state of health, do you have any difficulty performing the following activities:  Hearing? 0  Vision? 0  Difficulty concentrating or making decisions? 0  Walking or climbing stairs? 0  Dressing or bathing? 0  Doing errands, shopping? 0  Preparing Food and eating ? N  Using the Toilet? N  In the past six months, have you accidently leaked urine? N  Do you have problems with loss of bowel control? N  Managing your Medications? N  Managing your Finances? N  Housekeeping or managing your Housekeeping? N    Patient Care Team: Colene Dauphin, MD as PCP - General (Internal Medicine) Arnoldo Lapping, MD as PCP - Cardiology (Cardiology) Sherwood Donath as Physician Assistant (Cardiology) Florance Hun, MD as Consulting Physician (Ophthalmology)  I have updated your Care Teams any recent Medical Services  you may have received from other providers in the past year.  Assessment:    This is a routine wellness examination for Zebadiah.  Hearing/Vision screen Hearing Screening - Comments:: Denies hearing difficulties   Vision Screening - Comments:: Wears rx glasses - up to date with routine eye exams with Dr Carloyn Chi   Goals Addressed               This Visit's Progress     Patient Stated (pt-stated)        Patient stated he will continue to exercise weekly       Depression Screen     01/28/2024   11:52 AM 12/07/2023    1:57 PM 04/03/2023    9:50 AM 10/13/2022    8:54 AM 10/03/2022    2:06 PM 09/05/2022    8:21 AM 02/03/2022    8:41 AM  PHQ 2/9 Scores  PHQ - 2 Score 0 0 0 0 0 0 0  PHQ- 9 Score 0  0 0  0 0    Fall Risk     01/28/2024    8:11 AM 12/07/2023    1:57 PM 04/03/2023    9:49 AM 10/13/2022    8:53 AM 09/05/2022    8:21 AM  Fall Risk   Falls in the past year? 0 0 0 0 0  Number falls in past yr: 0 0 0 0 0  Injury with Fall? 0 0 0 0 0  Risk for fall due to : No Fall Risks No Fall Risks No Fall Risks No Fall Risks No Fall Risks  Follow up Falls evaluation completed;Falls prevention discussed Falls evaluation completed Falls evaluation completed Falls prevention discussed Falls evaluation completed    MEDICARE RISK AT HOME:  Medicare Risk at Home Any stairs in or around the home?: No If so, are there any without handrails?: No Home free of loose throw rugs in walkways, pet beds, electrical cords, etc?: Yes Adequate lighting in your home to reduce risk of falls?: Yes Life alert?: No Use of a cane, walker or w/c?: No Grab bars in the bathroom?: No Shower chair or bench in shower?: Yes Elevated toilet seat or a handicapped toilet?: Yes  TIMED UP AND GO:  Was the test performed?  No  Cognitive Function: 6CIT completed        01/28/2024   11:55 AM 10/13/2022    8:56 AM  6CIT Screen  What Year? 0 points 0 points  What month? 0 points 0 points  What time? 0 points 0  points  Count back from 20 0 points 0 points  Months in reverse 0 points 0 points  Repeat phrase 0 points 0 points  Total Score 0 points 0 points    Immunizations Immunization History  Administered Date(s) Administered   Fluad Quad(high Dose 65+) 06/07/2019, 06/16/2020, 06/21/2021, 06/30/2022   Influenza, High Dose Seasonal PF 06/24/2018   Influenza-Unspecified 08/23/2017, 07/04/2018, 05/16/2023   PFIZER(Purple Top)SARS-COV-2 Vaccination 10/02/2019, 10/23/2019, 07/31/2020   Pneumococcal Conjugate-13 12/05/2017   Pneumococcal Polysaccharide-23 08/28/2010   Td 05/16/2006   Td (Adult), 2 Lf Tetanus Toxid, Preservative Free 05/16/2006   Zoster Recombinant(Shingrix) 07/01/2019, 11/15/2019    Screening Tests Health Maintenance  Topic Date Due   COVID-19 Vaccine (4 - 2024-25 season) 04/29/2023   DTaP/Tdap/Td (2 - Tdap) 12/06/2024 (Originally 05/16/2016)   INFLUENZA VACCINE  03/28/2024   Medicare Annual Wellness (AWV)  01/27/2025   Pneumonia Vaccine 41+ Years old  Completed   Hepatitis C Screening  Completed   Zoster Vaccines- Shingrix  Completed   HPV VACCINES  Aged Out   Meningococcal B Vaccine  Aged Out   Colonoscopy  Discontinued    Health Maintenance  Health Maintenance Due  Topic Date Due   COVID-19 Vaccine (4 - 2024-25 season) 04/29/2023   Health Maintenance Items Addressed: 01/28/2024   Additional Screening:  Vision Screening: Recommended annual ophthalmology exams for early detection of glaucoma and other disorders of the eye.  Pt has seen Dr Carloyn Chi of [facility] earlier this year (2025).    Dental Screening: Recommended annual dental exams for proper oral hygiene  Community Resource Referral / Chronic Care Management: CRR required this visit?  No   CCM required this visit?  No   Plan:    I have personally reviewed and noted the following in the patient's chart:   Medical and social history Use of alcohol, tobacco or illicit drugs  Current medications  and supplements including opioid prescriptions. Patient is not currently taking opioid prescriptions. Functional ability and status Nutritional status Physical activity Advanced directives List of other physicians Hospitalizations, surgeries, and ER visits in previous 12 months Vitals Screenings to include cognitive, depression, and falls Referrals and appointments  In addition, I have reviewed and discussed with patient certain preventive protocols, quality metrics, and best practice recommendations. A written personalized care plan for preventive services as well as general preventive health recommendations were provided to patient.   Patria Bookbinder, CMA 01/28/2024  After Visit Summary: (MyChart) Due to this being a telephonic visit, the after visit summary with patients personalized plan was offered to patient via MyChart   Notes: Nothing significant to report at this time.

## 2024-03-13 ENCOUNTER — Other Ambulatory Visit: Payer: Self-pay | Admitting: Cardiovascular Disease

## 2024-03-13 ENCOUNTER — Other Ambulatory Visit: Payer: Self-pay | Admitting: Physician Assistant

## 2024-05-02 ENCOUNTER — Ambulatory Visit: Payer: Self-pay | Admitting: *Deleted

## 2024-05-02 NOTE — Telephone Encounter (Signed)
 Needs appt

## 2024-05-02 NOTE — Telephone Encounter (Signed)
 Copied from CRM [id number]. Topic: Clinical - Red Word Triage >> May 02, 2024 11:11 AM [place] E wrote: [facility] that prompted transfer to Nurse Triage: Cough going on for 3 weeks, patient stated it is worse in the morning and then calms down at night. Cough is due to bat droppings in house, which now bats are gone. Reason for Disposition  [1] Continuous (nonstop) coughing interferes with work or school AND [2] no improvement using cough treatment per Care Advice    Weak immune system  Answer Assessment - Initial Assessment Questions 1. ONSET: When did the cough begin?      I have a non productive cough that is lingering.   I've tried cough medicine.  No chest pains or fever.    3 weeks ago I was exposed bats in my house.   My cough is not going away.   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      I took  some codeine cough medicine I left over.   It's helping.    2. SEVERITY: How bad is the cough today?      It's lingering.   It's not getting worse.    3. SPUTUM: Describe the color of your sputum (e.g., none, dry cough; clear, white, yellow, green)     Nothing coming up 4. HEMOPTYSIS: Are you coughing up any blood? If Yes, ask: How much? (e.g., flecks, streaks, tablespoons, etc.)     Not asked 5. DIFFICULTY BREATHING: Are you having difficulty breathing? If Yes, ask: How bad is it? (e.g., mild, moderate, severe)      No just the lingering cough.    6. FEVER: Do you have a fever? If Yes, ask: What is your temperature, how was it measured, and when did it start?     No 7. CARDIAC HISTORY: Do you have any history of heart disease? (e.g., heart attack, congestive heart failure)      Heart attack, stroke and cancer 8. LUNG HISTORY: Do you have any history of lung disease?  (e.g., pulmonary embolus, asthma, emphysema)     I was exposed to bats in my house for 3-4 weeks before I realized what it was.   We saw a bat flying in the house and realized what it  was. 9. PE RISK FACTORS: Do you have a history of blood clots? (or: recent major surgery, recent prolonged travel, bedridden)     Not asked 10. OTHER SYMPTOMS: Do you have any other symptoms? (e.g., runny nose, wheezing, chest pain)       No  11. PREGNANCY: Is there any chance you are pregnant? When was your last menstrual period?       N/A 12. TRAVEL: Have you traveled out of the country in the last month? (e.g., travel history, exposures)       N/A  Protocols used: Cough - Acute Non-Productive-A-AH FYI Only or Action Required?: FYI only for provider.  Patient was last seen in primary care on 12/07/2023 by Geofm Glade PARAS, MD.  Called Nurse Triage reporting Cough (From bat droppings).  Symptoms began about a month ago.  Interventions attempted: Prescription medications: codeine cough medicine he had left over.  Symptoms are: unchanged.lingering cough from being exposed to bat droppings.   Rest of family is fine.  His cough is lingering.  Triage Disposition: See Physician Within 24 Hours  Patient/caregiver understands and will follow disposition?: Yes

## 2024-05-04 NOTE — Patient Instructions (Incomplete)
      A chest x-ray was ordered.       Medications changes include :   None    A referral was ordered and someone will call you to schedule an appointment.     No follow-ups on file.

## 2024-05-04 NOTE — Progress Notes (Unsigned)
    Subjective:    Patient ID: Austin [name], male    DOB: 1946-05-21, 78 y.o.   MRN: [id number]      HPI Austin [name] is here for No chief complaint on file.     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  Current Outpatient Medications on File Prior to Visit  Medication Sig Dispense Refill   Alcohol Swabs (B-D SINGLE USE SWABS REGULAR) PADS 1 each by Does not apply route as needed. 3 each 3   amLODipine  (NORVASC ) 10 MG tablet Take 1 tablet (10 mg total) by mouth daily. 90 tablet 3   Ascorbic Acid (VITAMIN C PO) Take 1 tablet by mouth daily.     aspirin  81 MG tablet Take 1 tablet (81 mg total) by mouth daily. 30 tablet    Aspirin -Caffeine (BAYER BACK & BODY) 500-32.5 MG TABS Take 1 tablet by mouth daily as needed (pain).     clopidogrel  (PLAVIX ) 75 MG tablet TAKE 1 TABLET EVERY DAY 90 tablet 3   dorzolamide -timolol  (COSOPT ) 22.3-6.8 MG/ML ophthalmic solution Place 1 drop into both eyes 2 (two) times daily.      latanoprost (XALATAN) 0.005 % ophthalmic solution Place 1 drop into both eyes at bedtime.     linaclotide  (LINZESS ) 72 MCG capsule TAKE 1 CAPSULE(72 MCG) BY MOUTH DAILY BEFORE BREAKFAST 90 capsule 1   Menthol-Methyl Salicylate (MUSCLE RUB) 10-15 % CREA Apply 1 Application topically as needed for muscle pain.     nitroGLYCERIN  (NITROSTAT ) 0.4 MG SL tablet PLACE 1 TABLET UNDER THE TONGUE AS NEEDED FOR CHEST PAIN 25 tablet 11   OVER THE COUNTER MEDICATION Take 2 tablets by mouth daily with lunch. Prostate Strong     Polyvinyl Alcohol-Povidone (REFRESH OP) Place 1 drop into both eyes daily as needed (dry eyes).     ranolazine  (RANEXA ) 500 MG 12 hr tablet TAKE 1 TABLET(500 MG) BY MOUTH TWICE DAILY 180  tablet 3   Study - ORION 4 - inclisiran 300 mg/1.5mL or placebo SQ injection (PI-Stuckey) Inject 1.5 mLs (300 mg total) into the skin every 6 (six) months. 1 mL 0   No current facility-administered medications on file prior to visit.    Review of Systems     Objective:  There were no vitals filed for this visit. BP Readings from Last 3 Encounters:  01/29/24 (!) 146/76  01/28/24 132/70  12/07/23 126/84   Wt Readings from Last 3 Encounters:  01/29/24 195 lb 12.8 oz (88.8 kg)  01/28/24 198 lb (89.8 kg)  12/07/23 190 lb (86.2 kg)   There is no height or weight on file to calculate BMI.    Physical Exam         Assessment & Plan:    See Problem List for Assessment and Plan of chronic medical problems.

## 2024-05-05 ENCOUNTER — Ambulatory Visit: Admitting: Internal Medicine

## 2024-05-05 ENCOUNTER — Ambulatory Visit: Payer: Self-pay | Admitting: Internal Medicine

## 2024-05-05 ENCOUNTER — Ambulatory Visit (INDEPENDENT_AMBULATORY_CARE_PROVIDER_SITE_OTHER): Admitting: Internal Medicine

## 2024-05-05 ENCOUNTER — Encounter: Payer: Self-pay | Admitting: Internal Medicine

## 2024-05-05 ENCOUNTER — Ambulatory Visit (INDEPENDENT_AMBULATORY_CARE_PROVIDER_SITE_OTHER)

## 2024-05-05 VITALS — BP 120/62 | HR 64 | Temp 98.3°F | Ht 73.0 in | Wt 193.0 lb

## 2024-05-05 DIAGNOSIS — R051 Acute cough: Secondary | ICD-10-CM | POA: Diagnosis not present

## 2024-05-05 DIAGNOSIS — R059 Cough, unspecified: Secondary | ICD-10-CM | POA: Diagnosis not present

## 2024-05-05 DIAGNOSIS — I10 Essential (primary) hypertension: Secondary | ICD-10-CM

## 2024-05-05 DIAGNOSIS — R9389 Abnormal findings on diagnostic imaging of other specified body structures: Secondary | ICD-10-CM | POA: Diagnosis not present

## 2024-05-05 DIAGNOSIS — J069 Acute upper respiratory infection, unspecified: Secondary | ICD-10-CM | POA: Insufficient documentation

## 2024-05-05 MED ORDER — AZITHROMYCIN 250 MG PO TABS: ORAL_TABLET | ORAL | 0 refills | Status: AC

## 2024-05-05 NOTE — Assessment & Plan Note (Signed)
Chronic BP well controlled Continue amlodipine 10 mg daily

## 2024-05-05 NOTE — Assessment & Plan Note (Signed)
 Acute Unspecified cause-persistent x 4 weeks Started with other cold symptoms, but the cough has been persistent and has worsened Possibly viral versus atypical, versus possible histoplasmosis with recent bat exposure Chest x-ray today -  negative Z-Pak for possible atypical infection If no improvement advised him to follow-up

## 2024-05-05 NOTE — Assessment & Plan Note (Signed)
 Acute Unspecified Possibly viral versus atypical, versus possible histoplasmosis with recent bat exposure Chest x-ray today Cough has been going on for 4 weeks which is concerning Chest x-ray negative Z-Pak for possible atypical infection If no improvement advised him to follow-up

## 2024-05-13 DIAGNOSIS — H1131 Conjunctival hemorrhage, right eye: Secondary | ICD-10-CM | POA: Diagnosis not present

## 2024-05-14 ENCOUNTER — Encounter: Admitting: *Deleted

## 2024-05-14 DIAGNOSIS — Z006 Encounter for examination for normal comparison and control in clinical research program: Secondary | ICD-10-CM

## 2024-05-14 MED ORDER — STUDY - ORION 4 - INCLISIRAN 300 MG/1.5 ML OR PLACEBO SQ INJECTION (PI-STUCKEY)
300.0000 mg | INJECTION | SUBCUTANEOUS | Status: AC
  Administered 2024-05-14: 300 mg via SUBCUTANEOUS
  Filled 2024-05-14: qty 1.5

## 2024-05-14 NOTE — Research (Signed)
 [facility] 4   Patient doing well, no complaints.  Granddaughter graduates this coming May, he is excited.  No med changes per patient  Will see patient back in March 2026   Current Outpatient Medications:    Alcohol Swabs (B-D SINGLE USE SWABS REGULAR) PADS, 1 each by Does not apply route as needed., Disp: 3 each, Rfl: 3   amLODipine  (NORVASC ) 10 MG tablet, Take 1 tablet (10 mg total) by mouth daily., Disp: 90 tablet, Rfl: 3   Ascorbic Acid (VITAMIN C PO), Take 1 tablet by mouth daily., Disp: , Rfl:    aspirin  81 MG tablet, Take 1 tablet (81 mg total) by mouth daily., Disp: 30 tablet, Rfl:    Aspirin -Caffeine (BAYER BACK & BODY) 500-32.5 MG TABS, Take 1 tablet by mouth daily as needed (pain)., Disp: , Rfl:    clopidogrel  (PLAVIX ) 75 MG tablet, TAKE 1 TABLET EVERY DAY, Disp: 90 tablet, Rfl: 3   dorzolamide -timolol  (COSOPT ) 22.3-6.8 MG/ML ophthalmic solution, Place 1 drop into both eyes 2 (two) times daily. , Disp: , Rfl:    latanoprost (XALATAN) 0.005 % ophthalmic solution, Place 1 drop into both eyes at bedtime., Disp: , Rfl:    linaclotide  (LINZESS ) 72 MCG capsule, TAKE 1 CAPSULE(72 MCG) BY MOUTH DAILY BEFORE BREAKFAST, Disp: 90 capsule, Rfl: 1   Menthol-Methyl Salicylate (MUSCLE RUB) 10-15 % CREA, Apply 1 Application topically as needed for muscle pain., Disp: , Rfl:    nitroGLYCERIN  (NITROSTAT ) 0.4 MG SL tablet, PLACE 1 TABLET UNDER THE TONGUE AS NEEDED FOR CHEST PAIN, Disp: 25 tablet, Rfl: 11   OVER THE COUNTER MEDICATION, Take 2 tablets by mouth daily with lunch. Prostate Strong, Disp: , Rfl:    Polyvinyl Alcohol-Povidone (REFRESH OP), Place 1 drop into both eyes daily as needed (dry eyes)., Disp: , Rfl:    ranolazine  (RANEXA ) 500 MG 12 hr tablet, TAKE 1 TABLET(500 MG) BY MOUTH TWICE DAILY, Disp: 180 tablet, Rfl: 3   Study - ORION 4 - inclisiran 300 mg/1.5mL or placebo SQ injection (PI-Stuckey), Inject 1.5 mLs (300 mg total) into the skin every 6 (six) months., Disp: 1 mL, Rfl: 0  Current  Facility-Administered Medications:    Study - ORION 4 - inclisiran 300 mg/1.11mL or placebo SQ injection (PI-Stuckey), 300 mg, Subcutaneous, Q6 months, , 300 mg at 05/14/24 1037'  Suzen Hardy :) RN BSN  Clinical Research Nurse  Be strong and take heart, all you who hope in the [place]. ~ Psalm 31:24

## 2024-06-02 ENCOUNTER — Telehealth: Payer: Self-pay

## 2024-06-02 NOTE — Telephone Encounter (Signed)
 Paper Work Dropped Off:   06-02-24 5th floor  Date: 06-02-24  Location of paper:  Dr Margurite Mailbox on 5th flo  paperwork for provider

## 2024-06-08 ENCOUNTER — Encounter: Payer: Self-pay | Admitting: Internal Medicine

## 2024-06-08 DIAGNOSIS — N1831 Chronic kidney disease, stage 3a: Secondary | ICD-10-CM | POA: Insufficient documentation

## 2024-06-08 NOTE — Progress Notes (Signed)
      Subjective:    Patient ID: Austin [name], male    DOB: [birth year], 78 y.o.   MRN: [id number]     HPI Lorimer is here for follow up of his chronic medical problems.  Discuss ckd 3a  Medications and allergies reviewed with patient and updated if appropriate.  Current Outpatient Medications on File Prior to Visit  Medication Sig Dispense Refill  . Alcohol Swabs (B-D SINGLE USE SWABS REGULAR) PADS 1 each by Does not apply route as needed. 3 each 3  . Ascorbic Acid (VITAMIN C PO) Take 1 tablet by mouth daily.    . aspirin  81 MG tablet Take 1 tablet (81 mg total) by mouth daily. 30 tablet   . Aspirin -Caffeine (BAYER BACK & BODY) 500-32.5 MG TABS Take 1 tablet by mouth daily as needed (pain).    . dorzolamide -timolol  (COSOPT ) 22.3-6.8 MG/ML ophthalmic solution Place 1 drop into both eyes 2 (two) times daily.     SABRA latanoprost (XALATAN) 0.005 % ophthalmic solution Place 1 drop into both eyes at bedtime.    . linaclotide  (LINZESS ) 72 MCG capsule TAKE 1 CAPSULE(72 MCG) BY MOUTH DAILY BEFORE BREAKFAST 90 capsule 1  . Menthol-Methyl Salicylate (MUSCLE RUB) 10-15 % CREA Apply 1 Application topically as needed for muscle pain.    SABRA OVER THE COUNTER MEDICATION Take 2 tablets by mouth daily with lunch. Prostate Strong    . Polyvinyl Alcohol-Povidone (REFRESH OP) Place 1 drop into both eyes daily as needed (dry eyes).    . Study - ORION 4 - inclisiran 300 mg/1.45mL or placebo SQ injection (PI-Stuckey) Inject 1.5 mLs (300 mg total) into the skin every 6 (six) months. 1 mL 0   Current Facility-Administered Medications on File Prior to Visit  Medication Dose Route Frequency Provider Last Rate Last Admin  . Study - ORION 4 - inclisiran 300 mg/1.5mL or placebo SQ injection (PI-Stuckey)  300 mg Subcutaneous Q6 months    300 mg at 05/14/24 1037     Review of Systems     Objective:  There were no vitals filed for this visit. BP Readings from Last 3 Encounters:  05/14/24 (!) 153/73   05/05/24 120/62  01/29/24 (!) 146/76   Wt Readings from Last 3 Encounters:  05/14/24 193 lb (87.5 kg)  05/05/24 193 lb (87.5 kg)  01/29/24 195 lb 12.8 oz (88.8 kg)   There is no height or weight on file to calculate BMI.    Physical Exam     Lab Results  Component Value Date   WBC 5.0 12/07/2023   HGB 13.4 12/07/2023   HCT 39.4 12/07/2023   PLT 277.0 12/07/2023   GLUCOSE 95 12/07/2023   CHOL 221 (H) 04/16/2020   TRIG 108 04/16/2020   HDL 49 04/16/2020   LDLDIRECT 173.2 07/21/2013   LDLCALC 150 (H) 04/16/2020   ALT 16 12/07/2023   AST 22 12/07/2023   NA 139 12/07/2023   K 4.7 12/07/2023   CL 101 12/07/2023   CREATININE 1.36 12/07/2023   BUN 18 12/07/2023   CO2 32 12/07/2023   TSH 2.11 12/07/2023   PSA 2.58 03/03/2010   INR 1.0 03/09/2016   HGBA1C 6.0 12/07/2023     Assessment & Plan:    See Problem List for Assessment and Plan of chronic medical problems.    This encounter was created in error - please disregard.

## 2024-06-08 NOTE — Patient Instructions (Addendum)
      Blood work was ordered.       Medications changes include :   None    A referral was ordered and someone will call you to schedule an appointment.     Return in about 6 months (around 12/08/2024) for Physical Exam.

## 2024-06-09 ENCOUNTER — Other Ambulatory Visit: Payer: Self-pay | Admitting: Cardiovascular Disease

## 2024-06-09 ENCOUNTER — Encounter: Admitting: Internal Medicine

## 2024-06-09 DIAGNOSIS — R7303 Prediabetes: Secondary | ICD-10-CM

## 2024-06-09 DIAGNOSIS — K5909 Other constipation: Secondary | ICD-10-CM

## 2024-06-09 DIAGNOSIS — N1831 Chronic kidney disease, stage 3a: Secondary | ICD-10-CM

## 2024-06-09 DIAGNOSIS — I25119 Atherosclerotic heart disease of native coronary artery with unspecified angina pectoris: Secondary | ICD-10-CM

## 2024-06-09 DIAGNOSIS — I10 Essential (primary) hypertension: Secondary | ICD-10-CM

## 2024-06-09 DIAGNOSIS — E782 Mixed hyperlipidemia: Secondary | ICD-10-CM

## 2024-06-09 NOTE — Assessment & Plan Note (Signed)
 Chronic BP well controlled Continue amlodipine  10 mg daily CMP

## 2024-06-09 NOTE — Assessment & Plan Note (Addendum)
 Chronic Decreased kidney function over the past year Mild in nature Reviewed diagnosis Avoid NSAIDs, increase water intake Avoid nephrotoxic drugs Stressed good BP and sugar control CMP, CBC, urine albumin/creatinine ratio

## 2024-06-09 NOTE — Assessment & Plan Note (Signed)
 Chronic Lab Results  Component Value Date   HGBA1C 6.0 12/07/2023   Check a1c Low sugar / carb diet Stressed regular exercise

## 2024-06-09 NOTE — Assessment & Plan Note (Signed)
 History of lymphoma In remission Following with oncology - Dr Cloretta

## 2024-06-09 NOTE — Assessment & Plan Note (Signed)
 Chronic Following with cardiology History of MI, multiple stents Denies chest pain, shortness of breath Statin, Zetia  intolerant Currently in trial-Orion 4 - Inclsiran  Continue aspirin  81 mg daily, Plavix  75 mg daily, Ranexa  500 mg twice daily Blood pressure well-controlled

## 2024-06-09 NOTE — Assessment & Plan Note (Signed)
 Chronic Statin, Zetia  intolerant Regular exercise and healthy diet encouraged Currently in trial -Orion 4 - Inclisiran

## 2024-06-09 NOTE — Assessment & Plan Note (Signed)
 Chronic Controlled, stable Continue Linzess 72 mcg daily

## 2024-06-10 NOTE — Telephone Encounter (Signed)
 Pt is f/u on paperwork and is requesting a copy be mailed to him or faxed to [phone]. Please advise

## 2024-06-10 NOTE — Telephone Encounter (Signed)
 Paper Work Dropped Off:   06-02-24 5th floor   Date: 06-02-24   Location of paper:  Dr Margurite Mailbox on 5[address] flo  paperwork for provider     Note from 06/02/24.

## 2024-06-25 ENCOUNTER — Encounter: Payer: Self-pay | Admitting: Internal Medicine

## 2024-07-01 NOTE — Telephone Encounter (Signed)
 Spoke with pt who stated his FMLA has already been taken care of and approved, our office doesn't need to do anything else.

## 2024-07-10 ENCOUNTER — Other Ambulatory Visit: Payer: Self-pay

## 2024-07-11 MED ORDER — RANOLAZINE ER 500 MG PO TB12: 500.0000 mg | ORAL_TABLET | Freq: Two times a day (BID) | ORAL | 2 refills | Status: AC

## 2024-07-11 MED ORDER — NITROGLYCERIN 0.4 MG SL SUBL: 0.4000 mg | SUBLINGUAL_TABLET | SUBLINGUAL | 2 refills | Status: AC | PRN

## 2024-07-25 ENCOUNTER — Other Ambulatory Visit: Payer: Self-pay | Admitting: Internal Medicine

## 2024-08-11 ENCOUNTER — Encounter: Payer: Self-pay | Admitting: *Deleted

## 2024-08-11 NOTE — Progress Notes (Signed)
 Received phone message from [employer] that patient is requesting medical records. 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 Emailed the message to HIM and sent Mr. Valma Mychart message that this was done and provided HIM phone # to f/u.

## 2024-09-02 ENCOUNTER — Telehealth: Payer: Self-pay | Admitting: *Deleted

## 2024-09-02 NOTE — Telephone Encounter (Signed)
 Austin [name] dropped off signed ROI and Disability/FMLA intake form with no other forms attached. Called to inquire where his forms are to complete for his FMLA. He reports not needing any FMLA since he is retired. Sent above forms to HIM

## 2024-10-02 ENCOUNTER — Encounter: Payer: Self-pay | Admitting: *Deleted

## 2024-10-02 ENCOUNTER — Inpatient Hospital Stay: Payer: Medicare HMO | Admitting: Oncology

## 2024-10-02 NOTE — Progress Notes (Signed)
 Mr. Counsell was no show today for OV (1 year f/u). Scheduling message sent to reschedule for 1-2 months.

## 2024-11-11 ENCOUNTER — Encounter

## 2024-12-01 ENCOUNTER — Inpatient Hospital Stay: Admitting: Oncology

## 2024-12-12 ENCOUNTER — Encounter: Admitting: Internal Medicine

## 2025-01-30 ENCOUNTER — Ambulatory Visit
